# Patient Record
Sex: Female | Born: 1985
Health system: Southern US, Community
[De-identification: ages and names within clinical notes are randomized; demographics above are authoritative.]

## PROBLEM LIST (undated history)

## (undated) DIAGNOSIS — N3010 Interstitial cystitis (chronic) without hematuria: Secondary | ICD-10-CM

## (undated) DIAGNOSIS — N941 Unspecified dyspareunia: Secondary | ICD-10-CM

## (undated) DIAGNOSIS — G4700 Insomnia, unspecified: Secondary | ICD-10-CM

## (undated) DIAGNOSIS — N946 Dysmenorrhea, unspecified: Secondary | ICD-10-CM

## (undated) DIAGNOSIS — R0789 Other chest pain: Secondary | ICD-10-CM

## (undated) DIAGNOSIS — R198 Other specified symptoms and signs involving the digestive system and abdomen: Secondary | ICD-10-CM

## (undated) DIAGNOSIS — H6993 Unspecified Eustachian tube disorder, bilateral: Secondary | ICD-10-CM

## (undated) DIAGNOSIS — I959 Hypotension, unspecified: Secondary | ICD-10-CM

## (undated) DIAGNOSIS — IMO0002 Reserved for concepts with insufficient information to code with codable children: Secondary | ICD-10-CM

## (undated) DIAGNOSIS — Z9889 Other specified postprocedural states: Secondary | ICD-10-CM

## (undated) DIAGNOSIS — R102 Pelvic and perineal pain: Secondary | ICD-10-CM

## (undated) DIAGNOSIS — K828 Other specified diseases of gallbladder: Secondary | ICD-10-CM

## (undated) DIAGNOSIS — E063 Autoimmune thyroiditis: Secondary | ICD-10-CM

## (undated) DIAGNOSIS — R9431 Abnormal electrocardiogram [ECG] [EKG]: Secondary | ICD-10-CM

## (undated) DIAGNOSIS — F419 Anxiety disorder, unspecified: Secondary | ICD-10-CM

## (undated) DIAGNOSIS — R0609 Other forms of dyspnea: Secondary | ICD-10-CM

## (undated) DIAGNOSIS — N809 Endometriosis, unspecified: Secondary | ICD-10-CM

## (undated) DIAGNOSIS — E559 Vitamin D deficiency, unspecified: Secondary | ICD-10-CM

## (undated) DIAGNOSIS — B2790 Infectious mononucleosis, unspecified without complication: Secondary | ICD-10-CM

## (undated) DIAGNOSIS — F329 Major depressive disorder, single episode, unspecified: Secondary | ICD-10-CM

## (undated) DIAGNOSIS — M779 Enthesopathy, unspecified: Secondary | ICD-10-CM

## (undated) DIAGNOSIS — R112 Nausea with vomiting, unspecified: Secondary | ICD-10-CM

## (undated) DIAGNOSIS — Z8249 Family history of ischemic heart disease and other diseases of the circulatory system: Secondary | ICD-10-CM

## (undated) DIAGNOSIS — Z86018 Personal history of other benign neoplasm: Secondary | ICD-10-CM

## (undated) DIAGNOSIS — K900 Celiac disease: Secondary | ICD-10-CM

## (undated) DIAGNOSIS — A630 Anogenital (venereal) warts: Secondary | ICD-10-CM

## (undated) DIAGNOSIS — J310 Chronic rhinitis: Secondary | ICD-10-CM

## (undated) DIAGNOSIS — L309 Dermatitis, unspecified: Secondary | ICD-10-CM

## (undated) DIAGNOSIS — F32A Depression, unspecified: Secondary | ICD-10-CM

## (undated) DIAGNOSIS — T7840XA Allergy, unspecified, initial encounter: Secondary | ICD-10-CM

## (undated) DIAGNOSIS — R109 Unspecified abdominal pain: Secondary | ICD-10-CM

## (undated) DIAGNOSIS — A770 Spotted fever due to Rickettsia rickettsii: Secondary | ICD-10-CM

## (undated) DIAGNOSIS — J342 Deviated nasal septum: Secondary | ICD-10-CM

## (undated) DIAGNOSIS — N343 Urethral syndrome, unspecified: Secondary | ICD-10-CM

## (undated) DIAGNOSIS — R42 Dizziness and giddiness: Secondary | ICD-10-CM

## (undated) DIAGNOSIS — D329 Benign neoplasm of meninges, unspecified: Secondary | ICD-10-CM

## (undated) DIAGNOSIS — J302 Other seasonal allergic rhinitis: Secondary | ICD-10-CM

## (undated) DIAGNOSIS — K219 Gastro-esophageal reflux disease without esophagitis: Secondary | ICD-10-CM

## (undated) DIAGNOSIS — D649 Anemia, unspecified: Secondary | ICD-10-CM

## (undated) HISTORY — DX: Other specified postprocedural states: R11.2

## (undated) HISTORY — DX: Gastro-esophageal reflux disease without esophagitis: K21.9

## (undated) HISTORY — PX: OTHER SURGICAL HISTORY: SHX169

## (undated) HISTORY — DX: Chronic rhinitis: J31.0

## (undated) HISTORY — DX: Abnormal electrocardiogram (ECG) (EKG): R94.31

## (undated) HISTORY — DX: Benign neoplasm of meninges, unspecified: D32.9

## (undated) HISTORY — PX: OVARIAN CYST REMOVAL: SHX89

## (undated) HISTORY — DX: Interstitial cystitis (chronic) without hematuria: N30.10

## (undated) HISTORY — DX: Urethral syndrome, unspecified: N34.3

## (undated) HISTORY — DX: Reserved for concepts with insufficient information to code with codable children: IMO0002

## (undated) HISTORY — DX: Anemia, unspecified: D64.9

## (undated) HISTORY — PX: TONSILLECTOMY: SHX5217

## (undated) HISTORY — DX: Hypotension, unspecified: I95.9

## (undated) HISTORY — DX: Dermatitis, unspecified: L30.9

## (undated) HISTORY — DX: Major depressive disorder, single episode, unspecified: F32.9

## (undated) HISTORY — DX: Other forms of dyspnea: R06.09

## (undated) HISTORY — DX: Other specified diseases of gallbladder: K82.8

## (undated) HISTORY — DX: Depression, unspecified: F32.A

## (undated) HISTORY — DX: Dysmenorrhea, unspecified: N94.6

## (undated) HISTORY — DX: Other seasonal allergic rhinitis: J30.2

## (undated) HISTORY — DX: Deviated nasal septum: J34.2

## (undated) HISTORY — DX: Other specified symptoms and signs involving the digestive system and abdomen: R19.8

## (undated) HISTORY — DX: Insomnia, unspecified: G47.00

## (undated) HISTORY — DX: Endometriosis, unspecified: N80.9

## (undated) HISTORY — DX: Vitamin D deficiency, unspecified: E55.9

## (undated) HISTORY — DX: Infectious mononucleosis, unspecified without complication: B27.90

## (undated) HISTORY — DX: Personal history of other benign neoplasm: Z86.018

## (undated) HISTORY — DX: Anxiety disorder, unspecified: F41.9

## (undated) HISTORY — DX: Unspecified abdominal pain: R10.9

## (undated) HISTORY — DX: Dizziness and giddiness: R42

## (undated) HISTORY — DX: Other specified postprocedural states: Z98.890

## (undated) HISTORY — DX: Celiac disease: K90.0

## (undated) HISTORY — DX: Pelvic and perineal pain: R10.2

## (undated) HISTORY — DX: Enthesopathy, unspecified: M77.9

## (undated) HISTORY — DX: Anogenital (venereal) warts: A63.0

## (undated) HISTORY — DX: Unspecified eustachian tube disorder, bilateral: H69.93

## (undated) HISTORY — DX: Spotted fever due to Rickettsia rickettsii: A77.0

## (undated) HISTORY — DX: Autoimmune thyroiditis: E06.3

## (undated) HISTORY — DX: Unspecified dyspareunia: N94.10

## (undated) HISTORY — DX: Personal history of other benign neoplasm: Z98.890

## (undated) HISTORY — PX: CRANIOTOMY: SHX93

## (undated) HISTORY — DX: Family history of ischemic heart disease and other diseases of the circulatory system: Z82.49

## (undated) HISTORY — DX: Allergy, unspecified, initial encounter: T78.40XA

## (undated) HISTORY — DX: Other chest pain: R07.89

---
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
## 2004-08-09 ENCOUNTER — Encounter: Admission: RE | Admit: 2004-08-09 | Discharge: 2004-08-09 | Payer: Self-pay | Admitting: Internal Medicine

## 2007-11-23 ENCOUNTER — Encounter: Admission: RE | Admit: 2007-11-23 | Discharge: 2007-11-23 | Payer: Self-pay | Admitting: Family Medicine

## 2007-12-16 ENCOUNTER — Encounter: Admission: RE | Admit: 2007-12-16 | Discharge: 2007-12-16 | Payer: Self-pay | Admitting: Family

## 2011-10-06 ENCOUNTER — Ambulatory Visit (INDEPENDENT_AMBULATORY_CARE_PROVIDER_SITE_OTHER): Payer: BC Managed Care – PPO | Admitting: Physician Assistant

## 2011-10-06 DIAGNOSIS — H1045 Other chronic allergic conjunctivitis: Secondary | ICD-10-CM

## 2011-10-06 DIAGNOSIS — R350 Frequency of micturition: Secondary | ICD-10-CM

## 2011-10-06 DIAGNOSIS — F411 Generalized anxiety disorder: Secondary | ICD-10-CM

## 2011-10-06 DIAGNOSIS — R319 Hematuria, unspecified: Secondary | ICD-10-CM

## 2011-10-21 HISTORY — PX: UPPER GASTROINTESTINAL ENDOSCOPY: SHX188

## 2011-10-21 HISTORY — PX: COLONOSCOPY: SHX174

## 2011-11-03 ENCOUNTER — Ambulatory Visit (INDEPENDENT_AMBULATORY_CARE_PROVIDER_SITE_OTHER): Payer: BC Managed Care – PPO | Admitting: Physician Assistant

## 2011-11-03 DIAGNOSIS — N760 Acute vaginitis: Secondary | ICD-10-CM

## 2011-11-03 DIAGNOSIS — R300 Dysuria: Secondary | ICD-10-CM

## 2011-11-03 DIAGNOSIS — R1084 Generalized abdominal pain: Secondary | ICD-10-CM

## 2011-11-03 DIAGNOSIS — H209 Unspecified iridocyclitis: Secondary | ICD-10-CM

## 2011-11-04 ENCOUNTER — Other Ambulatory Visit: Payer: Self-pay | Admitting: Internal Medicine

## 2011-11-04 DIAGNOSIS — R102 Pelvic and perineal pain: Secondary | ICD-10-CM

## 2011-11-06 ENCOUNTER — Ambulatory Visit
Admission: RE | Admit: 2011-11-06 | Discharge: 2011-11-06 | Disposition: A | Discharge status: Home / Self Care | Payer: BC Managed Care – PPO | Source: Ambulatory Visit | Attending: Internal Medicine | Admitting: Internal Medicine

## 2011-11-06 DIAGNOSIS — R102 Pelvic and perineal pain: Secondary | ICD-10-CM

## 2011-11-11 ENCOUNTER — Other Ambulatory Visit: Payer: Self-pay

## 2011-12-03 ENCOUNTER — Ambulatory Visit (INDEPENDENT_AMBULATORY_CARE_PROVIDER_SITE_OTHER): Payer: BC Managed Care – PPO | Admitting: Family Medicine

## 2011-12-03 ENCOUNTER — Ambulatory Visit: Payer: BC Managed Care – PPO

## 2011-12-03 DIAGNOSIS — M79609 Pain in unspecified limb: Secondary | ICD-10-CM

## 2011-12-03 DIAGNOSIS — R5381 Other malaise: Secondary | ICD-10-CM

## 2011-12-03 DIAGNOSIS — M79606 Pain in leg, unspecified: Secondary | ICD-10-CM

## 2011-12-03 DIAGNOSIS — R079 Chest pain, unspecified: Secondary | ICD-10-CM

## 2011-12-03 DIAGNOSIS — R109 Unspecified abdominal pain: Secondary | ICD-10-CM

## 2011-12-03 DIAGNOSIS — IMO0001 Reserved for inherently not codable concepts without codable children: Secondary | ICD-10-CM

## 2011-12-03 DIAGNOSIS — R5383 Other fatigue: Secondary | ICD-10-CM

## 2011-12-03 DIAGNOSIS — R319 Hematuria, unspecified: Secondary | ICD-10-CM

## 2011-12-03 DIAGNOSIS — N3010 Interstitial cystitis (chronic) without hematuria: Secondary | ICD-10-CM

## 2011-12-03 LAB — POCT CBC
Granulocyte percent: 53.1 %G (ref 37–80)
HCT, POC: 40.6 % (ref 37.7–47.9)
Hemoglobin: 13.3 g/dL (ref 12.2–16.2)
Lymph, poc: 1.8 (ref 0.6–3.4)
MCH, POC: 30.6 pg (ref 27–31.2)
MCHC: 32.8 g/dL (ref 31.8–35.4)
MCV: 93.4 fL (ref 80–97)
MID (cbc): 0.3 (ref 0–0.9)
MPV: 9.8 fL (ref 0–99.8)
POC Granulocyte: 2.4 (ref 2–6.9)
POC LYMPH PERCENT: 40.9 %L (ref 10–50)
POC MID %: 6 %M (ref 0–12)
Platelet Count, POC: 260 10*3/uL (ref 142–424)
RBC: 4.35 M/uL (ref 4.04–5.48)
RDW, POC: 13.1 %
WBC: 4.5 10*3/uL — AB (ref 4.6–10.2)

## 2011-12-03 LAB — COMPREHENSIVE METABOLIC PANEL
ALT: 17 U/L (ref 0–35)
AST: 21 U/L (ref 0–37)
Albumin: 4.8 g/dL (ref 3.5–5.2)
Alkaline Phosphatase: 39 U/L (ref 39–117)
BUN: 9 mg/dL (ref 6–23)
CO2: 26 mEq/L (ref 19–32)
Calcium: 9.6 mg/dL (ref 8.4–10.5)
Chloride: 106 mEq/L (ref 96–112)
Creat: 0.64 mg/dL (ref 0.50–1.10)
Glucose, Bld: 82 mg/dL (ref 70–99)
Potassium: 4.2 mEq/L (ref 3.5–5.3)
Sodium: 141 mEq/L (ref 135–145)
Total Bilirubin: 0.5 mg/dL (ref 0.3–1.2)
Total Protein: 7.5 g/dL (ref 6.0–8.3)

## 2011-12-03 LAB — POCT UA - MICROSCOPIC ONLY
Casts, Ur, LPF, POC: NEGATIVE
Crystals, Ur, HPF, POC: NEGATIVE
Mucus, UA: NEGATIVE
Yeast, UA: NEGATIVE

## 2011-12-03 LAB — POCT SEDIMENTATION RATE: POCT SED RATE: 10 mm/hr (ref 0–22)

## 2011-12-03 LAB — POCT WET PREP WITH KOH
Clue Cells Wet Prep HPF POC: 100
KOH Prep POC: NEGATIVE
Trichomonas, UA: NEGATIVE
Yeast Wet Prep HPF POC: NEGATIVE

## 2011-12-03 LAB — POCT URINALYSIS DIPSTICK
Bilirubin, UA: NEGATIVE
Glucose, UA: NEGATIVE
Nitrite, UA: NEGATIVE
Protein, UA: 30
Spec Grav, UA: 1.025
Urobilinogen, UA: 0.2
pH, UA: 5.5

## 2011-12-03 LAB — POCT URINE PREGNANCY: Preg Test, Ur: NEGATIVE

## 2011-12-03 LAB — TSH: TSH: 1.767 u[IU]/mL (ref 0.350–4.500)

## 2011-12-03 LAB — VITAMIN B12: Vitamin B-12: 449 pg/mL (ref 211–911)

## 2011-12-03 LAB — GLUCOSE, POCT (MANUAL RESULT ENTRY): POC Glucose: 76

## 2011-12-03 MED ORDER — CYCLOBENZAPRINE HCL 5 MG PO TABS: ORAL_TABLET | ORAL | Status: DC

## 2011-12-03 MED ORDER — METRONIDAZOLE 0.75 % VA GEL: 1.0000 | Freq: Two times a day (BID) | VAGINAL | Status: AC

## 2011-12-03 MED ORDER — OMEPRAZOLE 20 MG PO CPDR: 20.0000 mg | DELAYED_RELEASE_CAPSULE | Freq: Every day | ORAL | Status: DC

## 2011-12-03 MED ORDER — DULOXETINE HCL 30 MG PO CPEP: 30.0000 mg | ORAL_CAPSULE | Freq: Every day | ORAL | Status: DC

## 2011-12-03 NOTE — Progress Notes (Signed)
Patient ID: Mary Wyatt MRN: [id number], DOB: 13-Feb-1986, 26 y.o. Date of Encounter: 12/03/2011, 12:41 PM  Primary Physician: Kennon Portela, MD, MD  Chief Complaint: Continued abdominal pain, now with chest pain/burning for 1 week, and intermittent bilateral leg pain for 2 months.  HPI: 26 y.o. year old female with history below presents with multiple complaints. 1. Chest pain for 1 week. Described as a midline burning sensation. Pain is constant, but more noticeable at night when she lays down for sleep. Described as 5/10 at its max. No associated nausea, vomiting, or diaphoresis. Has taken Zantac 150 mg off and on, and attributes relief with this medication. Denies any family history of cardiac disease. Denies illicit substances. She also mentions a mildly chronic cough, stating that she feel like she is frequently has a sensation to clear her throat.  2. Continued abdominal abdominal pain for several months now. Pain is generalized over the abdomen, but worse along the RLQ. Has seen urology and her GYN for this and told she likely has IC with exacerbating pain secondary to hormonal changes with her OCP. Approximately 10 days prior she started a new OCP, however her symptoms have persisted. Her GYN is considering possible diagnosis of endometriosis, however no formal work up has been done to date. She has since stopped taking her Uribel and Oxybutynin. Her urinary frequency has decreased since her last visit. She denies any dysuria, urgency, nausea, or vomiting. Does complain of vaginal discharge at baseline. Last Pap smear fall 2012: normal. No intercourse since the first week of January. No new partners. Does drink a moderate amount of alcohol.  3. Complains of generalized intermittent bilateral lower extremity pain for the past 2 months. This is the first time she mentions this me. States her "whole legs" ache. No trauma or injury. No associated back pain. No loss of bowel or bladder  function. No numbness or paresthesias. No rash. "I'm just tired of feeling achy."    Past Medical History  Diagnosis Date  . Interstitial cystitis      Home Meds: Prior to Admission medications   Medication Sig Start Date End Date Taking? Authorizing Provider  Norgestim-Eth Estrad Triphasic (ORTHO TRI-CYCLEN, 28, PO) Take by mouth daily.   Yes Historical Provider, MD    Allergies:  Allergies  Allergen Reactions  . Diflucan Hives    History   Social History  . Marital Status: Single    Spouse Name: N/A    Number of Children: N/A  . Years of Education: N/A   Occupational History  . Not on file.   Social History Main Topics  . Smoking status: Never Smoker   . Smokeless tobacco: Not on file  . Alcohol Use: Yes  . Drug Use: No  . Sexually Active: Not Currently   Other Topics Concern  . Not on file   Social History Narrative  . No narrative on file     Review of Systems: Constitutional: negative for chills, fever, night sweats, weight changes, or fatigue  HEENT: negative for vision changes, hearing loss, congestion, rhinorrhea, ST, epistaxis, or sinus pressure Cardiovascular: negative for palpitations, SOB, or DOE Respiratory: negative for hemoptysis, wheezing, shortness of breath, or cough Abdominal: negative for nausea, vomiting, diarrhea, constipation, dysuria, frequency, urgency, abnormal bleeding, or pelvic pain Dermatological: negative for rash Neurologic: negative for headache, dizziness, or syncope All other systems reviewed and are otherwise negative with the exception to those above and in the HPI.   Physical Exam: Blood pressure 124/80,  pulse 80, temperature 99.1 F (37.3 C), temperature source Oral, resp. rate 16, height 5' 5.25" (1.657 m), weight 132 lb (59.875 kg), last menstrual period 10/23/2011., Body mass index is 21.80 kg/(m^2). General: Well developed, well nourished, in no acute distress. Head: Normocephalic, atraumatic, eyes without  discharge, sclera non-icteric, nares are without discharge. Bilateral auditory canals clear, TM's are without perforation, pearly grey and translucent with reflective cone of light bilaterally. Oral cavity moist, posterior pharynx without exudate, erythema, peritonsillar abscess, or post nasal drip.  Neck: Supple. No thyromegaly. Full ROM. No lymphadenopathy. Lungs: Clear bilaterally to auscultation without wheezes, rales, or rhonchi. Breathing is unlabored. Heart: RRR with S1 S2. No murmurs, rubs, or gallops appreciated. Abdomen: Soft, non-distended with normoactive bowel sounds. Mild generalized TTP greater in epigastric and RLQ regions. Negative Mcburney's, Rovsing's, and Iliopsoas. No table jar discomfort. No hepatosplenomegaly. No rebound/guarding. No obvious abdominal masses. Msk:  Strength and tone normal for age. Extremities/Skin: Warm and dry. No clubbing or cyanosis. No edema. No rashes or suspicious lesions. Neuro: Alert and oriented X 3. Moves all extremities spontaneously. Gait is normal. CNII-XII grossly in tact. Lower extremity reflexes 2+ bilaterally. Psych:  Responds to questions appropriately with a normal affect.   Labs: Results for orders placed in visit on 12/03/11  POCT CBC      Component Value Range   WBC 4.5 (*) 4.6 - 10.2 (K/uL)   Lymph, poc 1.8  0.6 - 3.4    POC LYMPH PERCENT 40.9  10 - 50 (%L)   MID (cbc) 0.3  0 - 0.9    POC MID % 6.0  0 - 12 (%M)   POC Granulocyte 2.4  2 - 6.9    Granulocyte percent 53.1  37 - 80 (%G)   RBC 4.35  4.04 - 5.48 (M/uL)   Hemoglobin 13.3  12.2 - 16.2 (g/dL)   HCT, POC 40.6  37.7 - 47.9 (%)   MCV 93.4  80 - 97 (fL)   MCH, POC 30.6  27 - 31.2 (pg)   MCHC 32.8  31.8 - 35.4 (g/dL)   RDW, POC 13.1     Platelet Count, POC 260  142 - 424 (K/uL)   MPV 9.8  0 - 99.8 (fL)  GLUCOSE, POCT (MANUAL RESULT ENTRY)      Component Value Range   POC Glucose 76    POCT SEDIMENTATION RATE      Component Value Range   POCT SED RATE    0 - 22  (mm/hr)  POCT UA - MICROSCOPIC ONLY      Component Value Range   WBC, Ur, HPF, POC 0-3     RBC, urine, microscopic 0-1     Bacteria, U Microscopic 3+     Mucus, UA negative     Epithelial cells, urine per micros 6-15     Crystals, Ur, HPF, POC negative     Casts, Ur, LPF, POC negative     Yeast, UA negative    POCT URINALYSIS DIPSTICK      Component Value Range   Color, UA yellow     Clarity, UA clear     Glucose, UA neg     Bilirubin, UA neg     Ketones, UA trace     Spec Grav, UA 1.025     Blood, UA trace-lysed     pH, UA 5.5     Protein, UA 30     Urobilinogen, UA 0.2     Nitrite, UA neg  Leukocytes, UA Trace    POCT URINE PREGNANCY      Component Value Range   Preg Test, Ur Negative    POCT WET PREP WITH KOH      Component Value Range   Trichomonas, UA Negative     Clue Cells Wet Prep HPF POC 100%     Epithelial Wet Prep HPF POC 1-4     Yeast Wet Prep HPF POC negative     Bacteria Wet Prep HPF POC 3+     RBC Wet Prep HPF POC 3-4     WBC Wet Prep HPF POC 3-8     KOH Prep POC Negative     [facility] reading (PRIMARY) by  Dr. Darron Doom. NAD. EKG: NSR, read with Dr. Darron Doom Labs pending: CMP, ESR, H pylori, TSH, B12, Prolactin, Uriprobe, urine culture   ASSESSMENT AND PLAN:  26 y.o. year old female with reflux, abdominal pain, leg pain, and BV. Consider somatization/fibromyalgia disorder. 1. Reflux -Likely the true etiology of her CP,and cough.  -Trial of omeprazole 20 mg #30 1 po daily RF3 -Decrease aggravating foods    2. Abdominal pain  -Trial of Cymbalta 30 mg daily #30 1 po daily RF1 -Etiology still somewhat unclear. Discussed in detail with patient today the differential diagnosis. She agrees with our evaluation and treatment plan - Advise patient to follow up with GYN as directed for further evaluation of possible endometriosis.  -Can also consider giving IC treatment a longer trial if no help with the above  3. Leg pain -Normal exam today.  -Trial of  flexeril 5 mg #30 1 po qhs RF2 -Again, discussed in detail with the patient today about the differential diagnosis for her complaints.  4. Bacterial vaginosis -Metrogel #1 apply one applicator full bid no RF -Consider boric acid if persistent   5. Await all labs for further treatment -Call for update -Patient has next scheduled appointment with me on 2/25. Will follow up with her at that time.   SignedChristell Faith, PA-C 12/03/2011 12:41 PM

## 2011-12-04 LAB — PROLACTIN: Prolactin: 7.6 ng/mL

## 2011-12-04 LAB — GC/CHLAMYDIA PROBE AMP, URINE
Chlamydia, Swab/Urine, PCR: NEGATIVE
GC Probe Amp, Urine: NEGATIVE

## 2011-12-05 LAB — URINE CULTURE
Colony Count: NO GROWTH
Organism ID, Bacteria: NO GROWTH

## 2011-12-09 LAB — H. PYLORI ANTIBODY, IGG: H Pylori IgG: <0.4 {ISR}

## 2011-12-15 ENCOUNTER — Ambulatory Visit (INDEPENDENT_AMBULATORY_CARE_PROVIDER_SITE_OTHER): Payer: BC Managed Care – PPO | Admitting: Family Medicine

## 2011-12-15 ENCOUNTER — Encounter: Payer: Self-pay | Admitting: Physician Assistant

## 2011-12-15 DIAGNOSIS — R1084 Generalized abdominal pain: Secondary | ICD-10-CM

## 2011-12-15 DIAGNOSIS — F411 Generalized anxiety disorder: Secondary | ICD-10-CM

## 2011-12-15 DIAGNOSIS — F419 Anxiety disorder, unspecified: Secondary | ICD-10-CM

## 2011-12-15 LAB — POCT UA - MICROSCOPIC ONLY
Casts, Ur, LPF, POC: NEGATIVE
Crystals, Ur, HPF, POC: NEGATIVE
Yeast, UA: NEGATIVE

## 2011-12-15 LAB — POCT WET PREP WITH KOH
KOH Prep POC: POSITIVE
RBC Wet Prep HPF POC: NEGATIVE
Trichomonas, UA: NEGATIVE
Yeast Wet Prep HPF POC: NEGATIVE

## 2011-12-15 LAB — POCT URINALYSIS DIPSTICK
Bilirubin, UA: NEGATIVE
Glucose, UA: NEGATIVE
Ketones, UA: NEGATIVE
Leukocytes, UA: NEGATIVE
Nitrite, UA: NEGATIVE
Protein, UA: NEGATIVE
Spec Grav, UA: 1.02
Urobilinogen, UA: 0.2
pH, UA: 7.5

## 2011-12-15 LAB — POCT URINE PREGNANCY: Preg Test, Ur: NEGATIVE

## 2011-12-15 MED ORDER — ALPRAZOLAM 0.5 MG PO TABS: 0.5000 mg | ORAL_TABLET | Freq: Three times a day (TID) | ORAL | Status: AC | PRN

## 2011-12-15 NOTE — Progress Notes (Signed)
Patient ID: Mary Wyatt MRN: [id number], DOB: 1985-11-20, 26 y.o. Date of Encounter: 12/15/2011, 2:12 PM  Primary Physician: No primary provider on file.  Chief Complaint: Follow up from 12/03/11 visit  HPI: 26 y.o. year old female with history below presents for follow up of abdominal pain, leg pain, and chest pain. Doing much better today. She has only taken 1 Cymbalta since her last visit. She states this caused her to have a "panic attack" and nightmares. She has been taking Xanax off and on since this last visit and has noticed a considerable improvement with her symptoms. She states years ago she tried Paxil with similar results to Cymbalta.  Abdominal pain has considerably improved to an occasional dull ache generalized over the abdomen. No sharp pains. Currently without pain. States it has "been a while" since she last had pain. She has not had any dysuria, urinary urgency, or urinary frequency. No current vaginal discharge or complaints.   No further chest pain. She does state that on most nights now she will have nightmares that will wake her, but will wake with a clear head. Xanax has helped with this complaint. Denies any SOB, dyspnea, nausea, vomiting, or diaphoresis.  Her bilateral leg pains have fully resolved since her last visit.     Past Medical History  Diagnosis Date  . Interstitial cystitis      Home Meds: Prior to Admission medications   Medication Sig Start Date End Date Taking? Authorizing Provider  cyclobenzaprine (FLEXERIL) 5 MG tablet 1 po qhs 12/03/11  Yes Rodderick Holtzer, PA-C  Norgestim-Eth Estrad Triphasic (ORTHO TRI-CYCLEN, 28, PO) Take by mouth daily.   Yes Historical Provider, MD  omeprazole (PRILOSEC) 20 MG capsule Take 1 capsule (20 mg total) by mouth daily. 12/03/11 12/02/12 Yes Elainah Rhyne, PA-C  DULoxetine (CYMBALTA) 30 MG capsule Take 1 capsule (30 mg total) by mouth daily. 12/03/11 12/02/12  Christell Faith, PA-C    Allergies:  Allergies  Allergen  Reactions  . Diflucan Hives    History   Social History  . Marital Status: Single    Spouse Name: N/A    Number of Children: N/A  . Years of Education: N/A   Occupational History  . Not on file.   Social History Main Topics  . Smoking status: Never Smoker   . Smokeless tobacco: Not on file  . Alcohol Use: Yes  . Drug Use: No  . Sexually Active: Not Currently   Other Topics Concern  . Not on file   Social History Narrative  . No narrative on file     Review of Systems: Constitutional: negative for chills, fever, night sweats, weight changes, or fatigue  HEENT: negative for vision changes, hearing loss, congestion, rhinorrhea, ST, epistaxis, or sinus pressure Cardiovascular: negative for chest pain or palpitations Respiratory: negative for hemoptysis, wheezing, shortness of breath, or cough Abdominal: negative for abdominal pain, nausea, vomiting, diarrhea, or constipation Dermatological: negative for rash Neurologic: negative for headache, dizziness, or syncope All other systems reviewed and are otherwise negative with the exception to those above and in the HPI.   Physical Exam: Blood pressure 132/70, pulse 96, temperature 98.4 F (36.9 C), temperature source Oral, resp. rate 16, height 5' 5"  (1.651 m), weight 135 lb 12.8 oz (61.598 kg), last menstrual period 10/23/2011., Body mass index is 22.60 kg/(m^2). General: Well developed, well nourished, in no acute distress. Head: Normocephalic, atraumatic, eyes without discharge, sclera non-icteric, nares are without discharge. Bilateral auditory canals clear, TM's are  without perforation, pearly grey and translucent with reflective cone of light bilaterally. Oral cavity moist, posterior pharynx without exudate, erythema, peritonsillar abscess, or post nasal drip.  Neck: Supple. No thyromegaly. Full ROM. No lymphadenopathy. Lungs: Clear bilaterally to auscultation without wheezes, rales, or rhonchi. Breathing is  unlabored. Heart: RRR with S1 S2. No murmurs, rubs, or gallops appreciated. Abdomen: Soft, non-tender, non-distended with normoactive bowel sounds. No hepatosplenomegaly. No rebound/guarding. No obvious abdominal masses. Msk:  Strength and tone normal for age. Extremities/Skin: Warm and dry. No clubbing or cyanosis. No edema. No rashes or suspicious lesions. Neuro: Alert and oriented X 3. Moves all extremities spontaneously. Gait is normal. CNII-XII grossly in tact. Psych:  Responds to questions appropriately with a normal affect.   Labs: Results for orders placed in visit on 12/15/11  POCT URINALYSIS DIPSTICK      Component Value Range   Color, UA yellow     Clarity, UA clear     Glucose, UA neg     Bilirubin, UA neg     Ketones, UA neg     Spec Grav, UA 1.020     Blood, UA trace     pH, UA 7.5     Protein, UA neg     Urobilinogen, UA 0.2     Nitrite, UA neg     Leukocytes, UA Negative    POCT UA - MICROSCOPIC ONLY      Component Value Range   WBC, Ur, HPF, POC 0-6     RBC, urine, microscopic 0-4     Bacteria, U Microscopic small     Mucus, UA trace     Epithelial cells, urine per micros 1-8     Crystals, Ur, HPF, POC neg     Casts, Ur, LPF, POC neg     Yeast, UA neg    POCT URINE PREGNANCY      Component Value Range   Preg Test, Ur Negative    POCT WET PREP WITH KOH      Component Value Range   Trichomonas, UA Negative     Clue Cells Wet Prep HPF POC 0-10     Epithelial Wet Prep HPF POC 5-28     Yeast Wet Prep HPF POC neg     Bacteria Wet Prep HPF POC 2+     RBC Wet Prep HPF POC neg     WBC Wet Prep HPF POC 0-6     KOH Prep POC Positive       ASSESSMENT AND PLAN:  26 y.o. year old female with resolved chest pain, abdominal pain, and leg pain secondary to somatization disorder/fibromyalgia. -Unable to tolerate Paxil, Zoloft, Prozac, Celexa, Lexapro, or Cymbalta -Trial of Xanax 0.5 mg 1 po tid prn anxiety #90 no RF -Should her abdominal pain return I feel it  would be best for her to follow up with her GYN for further testing -Recheck 1 month -Discussed with Dr. Darron Doom  Signed, Christell Faith, PA-C 12/15/2011 2:12 PM

## 2012-01-26 ENCOUNTER — Encounter: Payer: Self-pay | Admitting: Physician Assistant

## 2012-01-26 ENCOUNTER — Ambulatory Visit (INDEPENDENT_AMBULATORY_CARE_PROVIDER_SITE_OTHER): Payer: BC Managed Care – PPO | Admitting: Physician Assistant

## 2012-01-26 DIAGNOSIS — R1084 Generalized abdominal pain: Secondary | ICD-10-CM

## 2012-01-26 DIAGNOSIS — F411 Generalized anxiety disorder: Secondary | ICD-10-CM

## 2012-01-26 DIAGNOSIS — B3749 Other urogenital candidiasis: Secondary | ICD-10-CM

## 2012-01-26 LAB — POCT UA - MICROSCOPIC ONLY
Casts, Ur, LPF, POC: NEGATIVE
Crystals, Ur, HPF, POC: NEGATIVE
Mucus, UA: NEGATIVE
Yeast, UA: NEGATIVE

## 2012-01-26 LAB — POCT URINALYSIS DIPSTICK
Bilirubin, UA: NEGATIVE
Glucose, UA: NEGATIVE
Ketones, UA: NEGATIVE
Leukocytes, UA: NEGATIVE
Nitrite, UA: NEGATIVE
Protein, UA: NEGATIVE
Spec Grav, UA: 1.015
Urobilinogen, UA: 0.2
pH, UA: 7

## 2012-01-26 LAB — POCT WET PREP WITH KOH
Clue Cells Wet Prep HPF POC: NEGATIVE
KOH Prep POC: POSITIVE
Trichomonas, UA: NEGATIVE
Yeast Wet Prep HPF POC: NEGATIVE

## 2012-01-26 LAB — POCT URINE PREGNANCY: Preg Test, Ur: NEGATIVE

## 2012-01-26 NOTE — Progress Notes (Signed)
Patient ID: Mary Wyatt MRN: [id number], DOB: September 06, 1986, 26 y.o. Date of Encounter: 01/26/2012, 1:35 PM  Primary Physician: No primary provider on file.  Chief Complaint: Follow up abdominal pain, and urinary frequency  HPI: 26 y.o. year old female with history below presents for follow up of abdominal pain, and urinary frequency. See previous OV's. Since her last visit in Feb she continues to complain of off and on abdominal pain worse along the right lower quadrant. Her pain is still characterized as an ache, that becomes sharp if she presses along the RLQ. It has been discussed that this pain may be related to a possible diagnosis of endometriosis, however medical therapy has ben advised to date. Unfortunately she has now failed her 3rd different OCP to help manage this possible diagnosis. She does have an appointment with her GYN the first part of May for a repeat Pap smear, she plans to discuss her pain with them at that time. Today she would like to recheck her urine, blood work, and wet prep. She has not had intercourse since sometime in January 2013.  She mentions that her urinary frequency/urgency had been resolved for "sometime," however it did return over the past few days. She notes the return of her increased frequency/urgency coupled with her menses. Since her menses have resolved her vaginal complaints have resolved.  She describes a new symptoms today of a "bulge" just below her sternum that has been present since 12/16/11. She notes that since she has noticed this bulge she has had an increased amount of belching, heartburn, bloating sensation, and dry cough. This area is sore to palpation and is described as about the size of a mouse ball. She feels like it may have decreased in size over the past weeks. No hemoptysis, nausea, or emesis. She denies any constipation or diarrhea.   She has tried taking her Xanax 0.5 mg daily to bid for anxiety and feels like it has helped. She  however is not sure if this has helped with her above issues, as she states she didn't think about them. She would like to continue to take her Xanax 0.5 mg, but does not currently need a refill today.  She has not had any further chest pains since her visit on 12/03/11. Her leg pains have also resolved. She states they were sore "one day, but that was after I worked out."   Past Medical History  Diagnosis Date  . Interstitial cystitis      Home Meds: Prior to Admission medications   Medication Sig Start Date End Date Taking? Authorizing Provider  ALPRAZolam Duanne Moron) 0.5 MG tablet Take 0.5 mg by mouth at bedtime as needed.   Yes Historical Provider, MD  Norgestim-Eth Estrad Triphasic (ORTHO TRI-CYCLEN, 28, PO) Take by mouth daily.   Yes Historical Provider, MD  omeprazole (PRILOSEC) 20 MG capsule Take 1 capsule (20 mg total) by mouth daily. 12/03/11 12/02/12 Yes Keltin Baird M Jin Shockley, PA-C  cyclobenzaprine (FLEXERIL) 5 MG tablet 1 po qhs 12/03/11   Rise Mu, PA-C  DULoxetine (CYMBALTA) 30 MG capsule Take 1 capsule (30 mg total) by mouth daily. 12/03/11 12/02/12  Rise Mu, PA-C    Allergies:  Allergies  Allergen Reactions  . Diflucan Hives    History   Social History  . Marital Status: Single    Spouse Name: N/A    Number of Children: N/A  . Years of Education: N/A   Occupational History  . Not on file.  Social History Main Topics  . Smoking status: Former Research scientist (life sciences)  . Smokeless tobacco: Not on file  . Alcohol Use: Yes     ocas  . Drug Use: No  . Sexually Active: Not Currently   Other Topics Concern  . Not on file   Social History Narrative  . No narrative on file     Review of Systems: Constitutional: negative for chills, fever, night sweats, weight changes, or fatigue  HEENT: negative for vision changes, hearing loss, congestion, rhinorrhea, ST, epistaxis, or sinus pressure Cardiovascular: negative for chest pain or palpitations Respiratory: negative for hemoptysis,  wheezing, dyspnea, or shortness of breath Abdominal: negative for nausea, vomiting, diarrhea, or constipation Dermatological: negative for rash Neurologic: negative for headache, dizziness, or syncope All other systems reviewed and are otherwise negative with the exception to those above and in the HPI.   Physical Exam: Blood pressure 113/63, pulse 64, temperature 98.9 F (37.2 C), temperature source Oral, resp. rate 16, height 5' 5.5" (1.664 m), weight 134 lb 12.8 oz (61.145 kg), last menstrual period 01/13/2012., Body mass index is 22.09 kg/(m^2). General: Well developed, well nourished, in no acute distress. Head: Normocephalic, atraumatic, eyes without discharge, sclera non-icteric, nares are without discharge. Bilateral auditory canals clear, TM's are without perforation, pearly grey and translucent with reflective cone of light bilaterally. Oral cavity moist, posterior pharynx without exudate, erythema, peritonsillar abscess, or post nasal drip.  Neck: Supple. No thyromegaly. Full ROM. No lymphadenopathy. Lungs: Clear bilaterally to auscultation without wheezes, rales, or rhonchi. Breathing is unlabored. Heart: RRR with S1 S2. No murmurs, rubs, or gallops appreciated. Abdomen: Soft with mild tender to palpation diffusely. Non-distended with normoactive bowel sounds. No hepatosplenomegaly. No rebound/guarding. No obvious abdominal masses. No mass palpated substernally. McBurney's, Rovsing's, Iliopsoas, and table jar all negative.  Msk:  Strength and tone normal for age. Extremities/Skin: Warm and dry. No clubbing or cyanosis. No edema. No rashes or suspicious lesions. Neuro: Alert and oriented X 3. Moves all extremities spontaneously. Gait is normal. CNII-XII grossly in tact. Psych:  Responds to questions appropriately with a normal affect.   Labs: Results for orders placed in visit on 01/26/12  POCT UA - MICROSCOPIC ONLY      Component Value Range   WBC, Ur, HPF, POC 0-3     RBC,  urine, microscopic 0-3     Bacteria, U Microscopic trace     Mucus, UA neg     Epithelial cells, urine per micros 0-5     Crystals, Ur, HPF, POC neg     Casts, Ur, LPF, POC neg     Yeast, UA neg    POCT URINALYSIS DIPSTICK      Component Value Range   Color, UA yellow     Clarity, UA clear     Glucose, UA neg     Bilirubin, UA neg     Ketones, UA neg     Spec Grav, UA 1.015     Blood, UA trace     pH, UA 7.0     Protein, UA neg     Urobilinogen, UA 0.2     Nitrite, UA neg     Leukocytes, UA Negative    POCT URINE PREGNANCY      Component Value Range   Preg Test, Ur Negative    POCT WET PREP WITH KOH      Component Value Range   Trichomonas, UA Negative     Clue Cells Wet Prep HPF POC neg  Epithelial Wet Prep HPF POC 0-8     Yeast Wet Prep HPF POC neg     Bacteria Wet Prep HPF POC 1+     RBC Wet Prep HPF POC 0-2     WBC Wet Prep HPF POC 0-2     KOH Prep POC Positive     CMP, TSH, CBC, H pylori, and urine culture all pending.  ASSESSMENT AND PLAN:  26 y.o. year old female with abdominal pain, reflux, anxiety, and vaginal candidiasis. 1. Abdominal pain -Unclear etiology. It has been felt that this was secondary to IC, she however feels this is incorrect. We have discussed possible causes in detail. She will discuss the possibility of endometriosis as a diagnosis with her GYN at her follow up appointment. Patient at this time seems to be leaning towards wanting a laproscopy. I have advised her to discuss this with her GYN. -GI referral   2. Reflux -Restart Oeprazole 20 mg bid, has refills -GI referral  3. Anxiety -Improving -Continue Xanax 0.5 mg 1 po bid scheduled. Did not write prescription today, as she did not yet need one. -Can Rx when she calls -Question how wide spread this component is relating to her above issues  4. Vaginal candidiasis -Allergy to diflucan -She does tolerate OTC Monostat, so she will pick up a package to treat locally  5. Await pending  labs -Follow up at this time has been set at 2 months, but will finalize this once labs are back for review  6. RTC precautions   Signed, Christell Faith, PA-C 01/26/2012 1:35 PM

## 2012-01-27 LAB — COMPREHENSIVE METABOLIC PANEL
ALT: 13 U/L (ref 0–35)
AST: 20 U/L (ref 0–37)
Albumin: 4.5 g/dL (ref 3.5–5.2)
Alkaline Phosphatase: 40 U/L (ref 39–117)
BUN: 8 mg/dL (ref 6–23)
CO2: 27 mEq/L (ref 19–32)
Calcium: 9.4 mg/dL (ref 8.4–10.5)
Chloride: 104 mEq/L (ref 96–112)
Creat: 0.6 mg/dL (ref 0.50–1.10)
Glucose, Bld: 84 mg/dL (ref 70–99)
Potassium: 4 mEq/L (ref 3.5–5.3)
Sodium: 138 mEq/L (ref 135–145)
Total Bilirubin: 0.4 mg/dL (ref 0.3–1.2)
Total Protein: 6.9 g/dL (ref 6.0–8.3)

## 2012-01-27 LAB — CBC
HCT: 36.9 % (ref 36.0–46.0)
Hemoglobin: 12.4 g/dL (ref 12.0–15.0)
MCH: 30.8 pg (ref 26.0–34.0)
MCHC: 33.6 g/dL (ref 30.0–36.0)
MCV: 91.8 fL (ref 78.0–100.0)
Platelets: 213 10*3/uL (ref 150–400)
RBC: 4.02 MIL/uL (ref 3.87–5.11)
RDW: 12.6 % (ref 11.5–15.5)
WBC: 4.1 10*3/uL (ref 4.0–10.5)

## 2012-01-27 LAB — URINE CULTURE
Colony Count: NO GROWTH
Organism ID, Bacteria: NO GROWTH

## 2012-01-27 LAB — TSH: TSH: 2.239 u[IU]/mL (ref 0.350–4.500)

## 2012-01-28 ENCOUNTER — Telehealth: Payer: Self-pay

## 2012-01-28 NOTE — Telephone Encounter (Signed)
Mary Wyatt,  PATIENT STATES SHE IS TO TAKE omeprazole (PRILOSEC) 20 MG capsule TWO TIMES A DAY, REQUESTING A NEW PRESCRIPTION CALLED INTO [facility] ON [address] TO REFLECT QUANTITY 60

## 2012-01-29 MED ORDER — OMEPRAZOLE 20 MG PO CPDR: 20.0000 mg | DELAYED_RELEASE_CAPSULE | Freq: Two times a day (BID) | ORAL | Status: DC

## 2012-01-29 NOTE — Telephone Encounter (Signed)
Done and sent to the pharmacy.  Mary Wyatt

## 2012-01-30 NOTE — Telephone Encounter (Signed)
Called pt to advise RX at pharmacy. Pt understood

## 2012-02-09 ENCOUNTER — Telehealth: Payer: Self-pay

## 2012-02-09 MED ORDER — ALPRAZOLAM 0.5 MG PO TABS: 0.5000 mg | ORAL_TABLET | Freq: Two times a day (BID) | ORAL | Status: DC

## 2012-02-09 NOTE — Telephone Encounter (Signed)
Can you please advise?

## 2012-02-09 NOTE — Telephone Encounter (Signed)
PT STATES SHE HAD LABS DONE ON THE 8TH AND STILL HASN'T HEARD FROM ANYONE. PLEASE CALL [phone] AND WAS ALSO TOLD BY RYAN TO CALL FOR A REFILL ON HER XANAX.

## 2012-02-09 NOTE — Telephone Encounter (Signed)
Labs are normal. Xanax refill sent in.  Lashonta Pilling

## 2012-02-10 ENCOUNTER — Other Ambulatory Visit: Payer: Self-pay | Admitting: Gastroenterology

## 2012-02-10 DIAGNOSIS — R1031 Right lower quadrant pain: Secondary | ICD-10-CM

## 2012-02-10 DIAGNOSIS — R110 Nausea: Secondary | ICD-10-CM

## 2012-02-10 NOTE — Telephone Encounter (Signed)
Spoke with pt advised labs normal and I faxed Rx to [facility] on [address]. Pt understood

## 2012-02-13 ENCOUNTER — Other Ambulatory Visit: Payer: BC Managed Care – PPO

## 2012-02-16 ENCOUNTER — Telehealth: Payer: Self-pay | Admitting: Physician Assistant

## 2012-02-16 DIAGNOSIS — F419 Anxiety disorder, unspecified: Secondary | ICD-10-CM

## 2012-02-16 MED ORDER — ALPRAZOLAM 0.5 MG PO TABS: 0.5000 mg | ORAL_TABLET | Freq: Three times a day (TID) | ORAL | Status: DC | PRN

## 2012-02-16 NOTE — Telephone Encounter (Signed)
rx printed

## 2012-02-20 ENCOUNTER — Ambulatory Visit
Admission: RE | Admit: 2012-02-20 | Discharge: 2012-02-20 | Disposition: A | Discharge status: Home / Self Care | Payer: BC Managed Care – PPO | Source: Ambulatory Visit | Attending: Gastroenterology | Admitting: Gastroenterology

## 2012-02-20 DIAGNOSIS — R110 Nausea: Secondary | ICD-10-CM

## 2012-02-20 DIAGNOSIS — R1031 Right lower quadrant pain: Secondary | ICD-10-CM

## 2012-02-20 MED ORDER — IOHEXOL 300 MG/ML  SOLN
100.0000 mL | Freq: Once | INTRAMUSCULAR | Status: AC | PRN
  Administered 2012-02-20: 100 mL via INTRAVENOUS

## 2012-02-27 ENCOUNTER — Other Ambulatory Visit: Payer: Self-pay | Admitting: Gastroenterology

## 2012-02-27 DIAGNOSIS — R1013 Epigastric pain: Secondary | ICD-10-CM

## 2012-02-27 DIAGNOSIS — R110 Nausea: Secondary | ICD-10-CM

## 2012-03-03 ENCOUNTER — Ambulatory Visit (INDEPENDENT_AMBULATORY_CARE_PROVIDER_SITE_OTHER): Payer: BC Managed Care – PPO | Admitting: Emergency Medicine

## 2012-03-03 ENCOUNTER — Encounter: Payer: Self-pay | Admitting: Physician Assistant

## 2012-03-03 ENCOUNTER — Ambulatory Visit: Payer: BC Managed Care – PPO

## 2012-03-03 DIAGNOSIS — R059 Cough, unspecified: Secondary | ICD-10-CM

## 2012-03-03 DIAGNOSIS — R05 Cough: Secondary | ICD-10-CM

## 2012-03-03 DIAGNOSIS — R1013 Epigastric pain: Secondary | ICD-10-CM

## 2012-03-03 DIAGNOSIS — Z23 Encounter for immunization: Secondary | ICD-10-CM

## 2012-03-03 DIAGNOSIS — F411 Generalized anxiety disorder: Secondary | ICD-10-CM

## 2012-03-03 DIAGNOSIS — R0789 Other chest pain: Secondary | ICD-10-CM

## 2012-03-03 DIAGNOSIS — R5383 Other fatigue: Secondary | ICD-10-CM

## 2012-03-03 LAB — POCT CBC
Lymph, poc: 1.9 (ref 0.6–3.4)
MCH, POC: 31.4 pg — AB (ref 27–31.2)
MCHC: 33.8 g/dL (ref 31.8–35.4)
MCV: 92.7 fL (ref 80–97)
MID (cbc): 0.2 (ref 0–0.9)
MPV: 9.1 fL (ref 0–99.8)
POC LYMPH PERCENT: 40.7 %L (ref 10–50)
POC MID %: 5.1 %M (ref 0–12)
Platelet Count, POC: 256 10*3/uL (ref 142–424)
RBC: 4.27 M/uL (ref 4.04–5.48)
RDW, POC: 12.4 %
WBC: 4.6 10*3/uL (ref 4.6–10.2)

## 2012-03-03 MED ORDER — TETANUS-DIPHTH-ACELL PERTUSSIS 5-2.5-18.5 LF-MCG/0.5 IM SUSP
0.5000 mL | Freq: Once | INTRAMUSCULAR | Status: AC
  Administered 2012-03-03: 0.5 mL via INTRAMUSCULAR

## 2012-03-03 NOTE — Progress Notes (Signed)
Patient ID: Mary Wyatt MRN: [id number], DOB: 1986/07/22, 26 y.o. Date of Encounter: 03/03/2012, 2:02 PM  Primary Physician: No primary provider on file.  Chief Complaint: Chest pain  HPI: 26 y.o. year old female with history below presents with several issues.  1) Chest pain has continued since her last office visit. Chest pain is located along the inferior aspect of the sternum and radiates superiorly up to the throat. Today it is the worst as it has been for "awhile." Pain is currently 6/10. No radiation, diaphoresis, nausea, vomiting, SOB, wheezing, palpitations, or edema. There is no neck or shoulder/arm pain, numbness, or tingling. She has previously seen Dr. Collene Mares for this as it was believed to be GI in etiology. She has had a trial of omeprazole, Zegrid, and Dexilant without success in treating her possible reflux. Today she states her flare up of chest pain was triggered by the painting at her work. She denies any family history of cardiac disease. She is preparing to go out to live on an [place] for 7 months at the end of June, and she would like to have this evaluated before then. Her chest pain was first mentioned to me in February 2013, at which time she was found to have an EKG with NSR and a negative CXR. At her subsequent office visits she has mentioned that her chest pain had resolved and it was felt that there was an anxiety component to her symptoms, so she was started on Xanax 0.5 mg bid. This seemed to have helped her, but she has stopped taking the Xanax as she states it made her feel "foggy headed." She denies any alcohol, tobacco use, or illicit substances.  2) She also mentions a new cough that is occasionally productive. There has been some post nasal drip lately as the season has changed. This has twice produced a small amount of blood tinged sputum. No SOB, wheezing, dyspnea, or edema. She does not feel like this is an illness. Has remained afebrile trough out. Her  reflux treatment above has not offered any relief to her cough.  3) Her abdominal pain remains the same along the right and left lower quadrants. She did have a CT abdomen and pelvis on 02/20/12 that was unremarkable other than bilateral ovarian cysts, likely physiologic. She has previously had a normal abdominal ultrasound in January 2013, as well as a normal pelvic/transvaginal ultrasound in January 2013.  She did have an EGD the previous week, ordered by GI, that I do not yet have the results of. Although she reports that she was told her small intestine was "inflammed" and that biopsies were sent off. She has had a negative gluten allergy work up as well. She is having a HIDA scan on 03/05/12, and also has a laparoscopy scheduled for 03/12/12. She has routinely had normal chemistries.   4) She also requests to update her tetanus vaccine. She is not certain when her last was, and [place] this today. No injury or trauma.    Past Medical History  Diagnosis Date  . Interstitial cystitis      Home Meds: Prior to Admission medications   Medication Sig Start Date End Date Taking? Authorizing Provider  cyclobenzaprine (FLEXERIL) 5 MG tablet 1 po qhs 12/03/11  Yes Kieli Golladay M Osamu Olguin, PA-C  Dexlansoprazole (DEXILANT PO) Take by mouth.   Yes Historical Provider, MD  Norgestim-Eth Estrad Triphasic (ORTHO TRI-CYCLEN, 28, PO) Take  by mouth daily.   Yes Historical Provider, MD  ALPRAZolam Duanne Moron) 0.5 MG tablet Take 1 tablet (0.5 mg total) by mouth 3 (three) times daily as needed for anxiety. 02/16/12  No Chelle S Jeffery, PA-C  omeprazole (PRILOSEC) 20 MG capsule Take 1 capsule (20 mg total) by mouth 2 (two) times daily. 01/29/12 01/28/13 No Rise Mu, PA-C    Allergies:  Allergies  Allergen Reactions  . Fluconazole In Dextrose Hives    History   Social History  . Marital Status: Single    Spouse Name: N/A    Number of Children: N/A  . Years of  Education: N/A   Occupational History  . Not on file.   Social History Main Topics  . Smoking status: Former Research scientist (life sciences)  . Smokeless tobacco: Not on file  . Alcohol Use: Yes     ocas  . Drug Use: No  . Sexually Active: Not Currently   Other Topics Concern  . Not on file   Social History Narrative  . No narrative on file     Review of Systems: Constitutional: negative for chills, fever, night sweats, weight changes, or fatigue  HEENT: negative for vision changes, hearing loss, congestion, rhinorrhea, ST, epistaxis, or sinus pressure Cardiovascular: see above Respiratory: negative for hemoptysis, wheezing, or shortness of breath Abdominal:see above Dermatological: negative for rash Neurologic: negative for headache, dizziness, or syncope All other systems reviewed and are otherwise negative with the exception to those above and in the HPI.   Physical Exam: Blood pressure 109/72, pulse 80, temperature 98.4 F (36.9 C), resp. rate 16, height 5' 5.5" (1.664 m), weight 132 lb 3.2 oz (59.966 kg)., Body mass index is 21.66 kg/(m^2). General: Well developed, well nourished, in no acute distress. Head: Normocephalic, atraumatic, eyes without discharge, sclera non-icteric, nares are without discharge. Bilateral auditory canals clear, TM's are without perforation, pearly grey and translucent with reflective cone of light bilaterally. Oral cavity moist, posterior pharynx without exudate, erythema, peritonsillar abscess, or post nasal drip.  Neck: Supple. No thyromegaly. Full ROM. No lymphadenopathy. Lungs: Clear bilaterally to auscultation without wheezes, rales, or rhonchi. Breathing is unlabored. Heart: RRR with S1 S2. No murmurs, rubs, or gallops appreciated. Abdomen: Soft, non-distended with normoactive bowel sounds. TTP over the epigastrium. No hepatosplenomegaly. No rebound/guarding. No obvious abdominal masses. Negative McBurney's, Rovsing's, Iliopsoas, and table jar.  Msk:  Strength  and tone normal for age. Extremities/Skin: Warm and dry. No clubbing or cyanosis. No edema. No rashes or suspicious lesions. Neuro: Alert and oriented X 3. Moves all extremities spontaneously. Gait is normal. CNII-XII grossly in tact. Psych:  Responds to questions appropriately with a normal affect.   Labs: Results for orders placed in visit on 03/03/12  POCT CBC      Component Value Range   WBC 4.6  4.6 - 10.2 (K/uL)   Lymph, poc 1.9  0.6 - 3.4    POC LYMPH PERCENT 40.7  10 - 50 (%L)   MID (cbc) 0.2  0 - 0.9    POC MID % 5.1  0 - 12 (%M)   POC Granulocyte 2.5  2 - 6.9    Granulocyte percent 54.2  37 - 80 (%G)   RBC 4.27  4.04 - 5.48 (M/uL)   Hemoglobin 13.4  12.2 - 16.2 (g/dL)   HCT, POC 39.6  37.7 - 47.9 (%)   MCV 92.7  80 - 97 (fL)   MCH, POC 31.4 (*) 27 - 31.2 (pg)   MCHC 33.8  31.8 - 35.4 (g/dL)   RDW, POC 12.4     Platelet Count, POC 256  142 - 424 (K/uL)   MPV 9.1  0 - 99.8 (fL)   TSH and BNP pending.  [facility] reading (PRIMARY) by  Dr. Everlene Farrier. Mild thoracic scoliosis o/w negative  EKG: sinus arrythmia    ASSESSMENT AND PLAN:  26 y.o. year old female with epigastric pain, chest pain, cough, anxiety, and need for tetanus vaccine. 1. Epigastric pain -Stable -Continue with treatment plan and evaluation as outlined by GI -Follow up after procedures/studies  2. Chest pain -Referral to cardiology for evaluation  3. Cough -Secondary to post nasal drip -Await labs -Should this continue/worsen plan for pulmonology referral  4. Anxiety -Patient understands this is playing a role in her above symptoms -She will continue her Xanax prn -Open to counseling in the future  5. Tetanus vaccine -Given today in office  6. Discussed with Dr. Everlene Farrier   Signed, Christell Faith, PA-C 03/03/2012 2:02 PM

## 2012-03-04 ENCOUNTER — Encounter: Payer: Self-pay | Admitting: Cardiovascular Disease

## 2012-03-04 ENCOUNTER — Telehealth: Payer: Self-pay

## 2012-03-04 ENCOUNTER — Ambulatory Visit (INDEPENDENT_AMBULATORY_CARE_PROVIDER_SITE_OTHER): Payer: BC Managed Care – PPO | Admitting: Cardiovascular Disease

## 2012-03-04 DIAGNOSIS — R079 Chest pain, unspecified: Secondary | ICD-10-CM

## 2012-03-04 NOTE — Assessment & Plan Note (Addendum)
Mary Wyatt presents today with episodes of chest pain. Her EKG was reportedly normal yesterday at urgent care. I'm not able to view the EKG today.  She is tender along her sternum. I suspect that she may have costochondritis. Her exam is fairly normal. She may have a very soft systolic murmur. We'll get an echocardiogram for further evaluation.  I think the chances of her having any coronary artery disease are near 0 %.  [place]. I'll see her back on an as-needed basis.

## 2012-03-04 NOTE — Progress Notes (Signed)
    Mary Wyatt Date of Birth  24-Apr-1986       [facility]    [employer] [address], [address] [place], [place], [place]  [id number]   Mary [place], [place]  [id number] [phone]     [phone]   Fax  [phone]    Fax [phone]  Problem List: 1. Chest pain  History of Present Illness:  S she's been treating this with antacids and other GI-related medications but has not noticed much relief. Mary Wyatt is a 26 yo with hx of chest pain / pressure.   She's not noticed any difference with exertion. The pain is there almost all the time. There could be a pleuritic component to the chest pain. The pain is not related to eating  or drinking.  There is no worsening with any activity.  She works at a desk job. She occasionally will rides stationary bike.  She is moving to the [place] in a month.   Current Outpatient Prescriptions on File Prior to Visit  Medication Sig Dispense Refill  . ALPRAZolam (XANAX) 0.5 MG tablet Take 1 tablet (0.5 mg total) by mouth 3 (three) times daily as needed for anxiety.  90 tablet  0  . Dexlansoprazole (DEXILANT PO) Take by mouth.      Mary Wyatt Triphasic (ORTHO TRI-CYCLEN, 28, PO) Take by mouth daily.      Marland Kitchen DISCONTD: omeprazole (PRILOSEC) 20 MG capsule Take 1 capsule (20 mg total) by mouth 2 (two) times daily.  60 capsule  3    Allergies  Allergen Reactions  . Fluconazole In Dextrose Hives    Past Medical History  Diagnosis Date  . Interstitial cystitis   . Chest pain     Past Surgical History  Procedure Date  . Tonsillectomy     History  Smoking status  . Former Smoker  Smokeless tobacco  . Not on file    History  Alcohol Use  . Yes    Occas.    No family history on file.  Reviw of Systems:  Reviewed in the HPI.  All other systems are negative.  Physical Exam: Blood pressure 106/68, pulse 85, height 5' 5.5" (1.664 m), weight 133 lb 12.8 oz (60.691 kg). General: Well  developed, well nourished, in no acute distress.  Head: Normocephalic, atraumatic, sclera non-icteric, mucus membranes are moist,   Neck: Supple. Carotids are 2 + without bruits. No JVD  Lungs: Clear bilaterally to auscultation.  She is tender along her sternum.  Heart: regular rate.  normal  S1 S2. No murmurs, gallops or rubs.  Abdomen: Soft, non-tender, non-distended with normal bowel sounds. No hepatomegaly. No rebound/guarding. No masses.  Msk:  Strength and tone are normal  Extremities: No clubbing or cyanosis. No edema.  Distal pedal pulses are 2+ and equal bilaterally.  Neuro: Alert and oriented X 3. Moves all extremities spontaneously.  Psych:  Responds to questions appropriately with a normal affect.  ECG Done yesterday - reportedly normal.  ECG is not available.  Assessment / Plan:

## 2012-03-04 NOTE — Patient Instructions (Signed)
Your physician has requested that you have an echocardiogram. Echocardiography is a painless test that uses sound waves to create images of your heart. It provides your doctor with information about the size and shape of your heart and how well your heart's chambers and valves are working. This procedure takes approximately one hour. There are no restrictions for this procedure.   Your physician recommends that you schedule a follow-up appointment in: as needed basis

## 2012-03-04 NOTE — Telephone Encounter (Signed)
Dr Acie Fredrickson is seeing pt per Christell Faith and he cant see the ekg report from yesterday or the ekg done in feb. Please send results via epic [phone] if need to contact him.

## 2012-03-05 ENCOUNTER — Ambulatory Visit (HOSPITAL_COMMUNITY)
Admission: RE | Admit: 2012-03-05 | Discharge: 2012-03-05 | Disposition: A | Discharge status: Home / Self Care | Payer: BC Managed Care – PPO | Source: Ambulatory Visit | Attending: Gastroenterology | Admitting: Gastroenterology

## 2012-03-05 DIAGNOSIS — R1013 Epigastric pain: Secondary | ICD-10-CM | POA: Insufficient documentation

## 2012-03-05 DIAGNOSIS — R110 Nausea: Secondary | ICD-10-CM | POA: Insufficient documentation

## 2012-03-05 MED ORDER — TECHNETIUM TC 99M MEBROFENIN IV KIT
5.0000 | PACK | Freq: Once | INTRAVENOUS | Status: AC | PRN
  Administered 2012-03-05: 5 via INTRAVENOUS

## 2012-03-05 NOTE — Telephone Encounter (Signed)
Called [facility] to let them know that I scanned in the system to ryan dunn's ov on 01/26/12.

## 2012-03-08 ENCOUNTER — Ambulatory Visit: Payer: BC Managed Care – PPO | Admitting: Physician Assistant

## 2012-03-09 ENCOUNTER — Ambulatory Visit (HOSPITAL_COMMUNITY): Payer: BC Managed Care – PPO | Attending: Cardiology

## 2012-03-09 ENCOUNTER — Other Ambulatory Visit: Payer: Self-pay

## 2012-03-09 DIAGNOSIS — I379 Nonrheumatic pulmonary valve disorder, unspecified: Secondary | ICD-10-CM | POA: Insufficient documentation

## 2012-03-09 DIAGNOSIS — R072 Precordial pain: Secondary | ICD-10-CM | POA: Insufficient documentation

## 2012-03-09 DIAGNOSIS — I079 Rheumatic tricuspid valve disease, unspecified: Secondary | ICD-10-CM | POA: Insufficient documentation

## 2012-03-09 DIAGNOSIS — I059 Rheumatic mitral valve disease, unspecified: Secondary | ICD-10-CM | POA: Insufficient documentation

## 2012-03-12 ENCOUNTER — Other Ambulatory Visit: Payer: Self-pay | Admitting: Obstetrics and Gynecology

## 2012-03-12 ENCOUNTER — Other Ambulatory Visit (HOSPITAL_COMMUNITY)
Admission: RE | Admit: 2012-03-12 | Discharge: 2012-03-12 | Disposition: A | Discharge status: Home / Self Care | Payer: BC Managed Care – PPO | Source: Ambulatory Visit | Attending: Obstetrics and Gynecology | Admitting: Obstetrics and Gynecology

## 2012-03-12 DIAGNOSIS — N83209 Unspecified ovarian cyst, unspecified side: Secondary | ICD-10-CM | POA: Insufficient documentation

## 2012-03-23 ENCOUNTER — Telehealth (INDEPENDENT_AMBULATORY_CARE_PROVIDER_SITE_OTHER): Payer: Self-pay | Admitting: General Surgery

## 2012-03-23 NOTE — Telephone Encounter (Signed)
Lisa from Dr. Lorie Apley office called asking if patient could be seen sooner. Patient will be moving the week of the 25th and they wanted her to be evaluated before she moves.  She stated patient dealing with nausea and that she will fax over the information as soon as possible. I advised that if the patient is moving out of the city she needs to let our office know if she is going to continue under Dr. Cristal Generous care or finding a new doctor when she moves.

## 2012-03-23 NOTE — Telephone Encounter (Signed)
Patient is scheduled 03/25/12 with Dr Zella Richer in the system.

## 2012-03-25 ENCOUNTER — Ambulatory Visit (INDEPENDENT_AMBULATORY_CARE_PROVIDER_SITE_OTHER): Payer: BC Managed Care – PPO | Admitting: General Surgery

## 2012-03-30 ENCOUNTER — Ambulatory Visit (INDEPENDENT_AMBULATORY_CARE_PROVIDER_SITE_OTHER): Payer: BC Managed Care – PPO | Admitting: General Surgery

## 2012-04-09 ENCOUNTER — Ambulatory Visit (INDEPENDENT_AMBULATORY_CARE_PROVIDER_SITE_OTHER): Payer: BC Managed Care – PPO | Admitting: Physician Assistant

## 2012-04-09 VITALS — BP 121/72 | HR 62 | Temp 98.3°F | Resp 19 | Ht 65.0 in | Wt 129.0 lb

## 2012-04-09 DIAGNOSIS — F419 Anxiety disorder, unspecified: Secondary | ICD-10-CM

## 2012-04-09 DIAGNOSIS — R109 Unspecified abdominal pain: Secondary | ICD-10-CM

## 2012-04-09 DIAGNOSIS — F411 Generalized anxiety disorder: Secondary | ICD-10-CM

## 2012-04-09 MED ORDER — ONDANSETRON 4 MG PO TBDP: 4.0000 mg | ORAL_TABLET | Freq: Three times a day (TID) | ORAL | Status: AC | PRN

## 2012-04-09 MED ORDER — MELOXICAM 7.5 MG PO TABS: 7.5000 mg | ORAL_TABLET | Freq: Every day | ORAL | Status: DC

## 2012-04-09 MED ORDER — ALPRAZOLAM 0.5 MG PO TABS: 0.5000 mg | ORAL_TABLET | Freq: Three times a day (TID) | ORAL | Status: DC | PRN

## 2012-04-09 NOTE — Progress Notes (Signed)
Patient ID: Mary Wyatt MRN: [id number], DOB: 01-Feb-1986, 26 y.o. Date of Encounter: 04/09/2012, 2:45 PM  Primary Physician: Marcille Blanco  Chief Complaint: Follow up  HPI: 26 y.o. year old female with history below presents for follow up of anxiety, abdominal pain, and chest pain. Since I last saw her she has been seen by the cardiologist for her chest pain, had an essentially normal echo. Her pain was felt to be secondary to costochondritis. No treatment was initiated. She does still have occasional episodes of sternal chest pain. None currently. These are not associated with exertion, activity, no SOB, wheezing, neck pain, shoulder, arm pain, or edema.   She had a laparoscopy that did reveal some endometriosis. Has been advised by her general surgeon and OB to continue her birth control for this condition. This is stable, and she is ok with this plan.   She also had a normal EGD and a HIDA scan that revealed a slightly decreased EF of the gallbladder. There has not been follow up on this at this time. She has been told there may be a gluten sensitivity. She is hesitant to believe this, but is slowly changing her diet. At this time she is uncertain if this has improved her symptoms.  She did recently reintroduce milk into her diet, which caused some nausea, cramping, and diarrhea. Since she has stopped the milk these symptoms have resolved.   She does continue to take her Xanax 0.5 mg tid prn. She requests a refill of this today. Doing well with this medication at this time.   She is moving to [facility] in 2 days for a minimum of 6 months, and possibly permanently. She is considering locating a pulmonologist and GI doctor there for further opinions.   Past Medical History  Diagnosis Date  . Interstitial cystitis   . Chest pain   . Anxiety      Home Meds: Prior to Admission medications   Medication Sig Start Date End Date Taking? Authorizing Provider  ALPRAZolam  Duanne Moron) 0.5 MG tablet Take 1 tablet (0.5 mg total) by mouth 3 (three) times daily as needed for anxiety. 02/16/12  Yes Chelle S Jeffery, PA-C  Norethin-Eth Estradiol-Fe (GENERESS FE PO) Take by mouth.   Yes Historical Provider, MD  Dexlansoprazole (DEXILANT PO) Take by mouth.   No Historical Provider, MD  Norgestim-Eth Estrad Triphasic (ORTHO TRI-CYCLEN, 28, PO) Take by mouth daily.   Yes Historical Provider, MD  omeprazole (PRILOSEC) 20 MG capsule Take 20 mg by mouth as needed. 01/29/12 01/28/13 No Rise Mu, PA-C    Allergies:  Allergies  Allergen Reactions  . Diflucan (Fluconazole) Hives  . Fluconazole In Dextrose Hives    History   Social History  . Marital Status: Single    Spouse Name: N/A    Number of Children: N/A  . Years of Education: N/A   Occupational History  . Not on file.   Social History Main Topics  . Smoking status: Never Smoker   . Smokeless tobacco: Not on file  . Alcohol Use: Yes     Occas.  . Drug Use: No  . Sexually Active: Not Currently   Other Topics Concern  . Not on file   Social History Narrative  . No narrative on file     Review of Systems: Constitutional: negative for chills, fever, night sweats, weight changes, or fatigue  HEENT: negative for vision changes, hearing loss, congestion, rhinorrhea, ST, epistaxis, or sinus pressure Cardiovascular: negative  for palpitations, edema, SOB, or cough Respiratory: negative for hemoptysis, wheezing, shortness of breath, or cough Abdominal: negative for vomiting, or constipation Dermatological: negative for rash Neurologic: negative for headache, dizziness, or syncope All other systems reviewed and are otherwise negative with the exception to those above and in the HPI.   Physical Exam: Blood pressure 121/72, pulse 62, temperature 98.3 F (36.8 C), temperature source Oral, resp. rate 19, height 5' 5"  (1.651 m), weight 129 lb (58.514 kg), last menstrual period 04/08/2012, SpO2 100.00%., Body mass  index is 21.47 kg/(m^2). General: Well developed, well nourished, in no acute distress. Head: Normocephalic, atraumatic, eyes without discharge, sclera non-icteric, nares are without discharge. Bilateral auditory canals clear, TM's are without perforation, pearly grey and translucent with reflective cone of light bilaterally. Oral cavity moist, posterior pharynx without exudate, erythema, peritonsillar abscess, or post nasal drip.  Neck: Supple. No thyromegaly. Full ROM. No lymphadenopathy. Lungs: Clear bilaterally to auscultation without wheezes, rales, or rhonchi. Breathing is unlabored. Heart: RRR with S1 S2. No murmurs, rubs, or gallops appreciated. Abdomen: Soft, non-distended with normoactive bowel sounds. Mild diffuse TTP through out, unchanged from her previous abdominal exams. No hepatosplenomegaly. No rebound/guarding. No obvious abdominal masses. Mcburney's, Rovsing's, Iliopsoas, and table jar all negative. Msk:  Strength and tone normal for age. Extremities/Skin: Warm and dry. No clubbing or cyanosis. No edema. No rashes or suspicious lesions. Neuro: Alert and oriented X 3. Moves all extremities spontaneously. Gait is normal. CNII-XII grossly in tact. Psych:  Responds to questions appropriately with a normal affect.     ASSESSMENT AND PLAN:  26 y.o. year old female with anxiety, abdominal pain likely secondary to endometriosis vs IC vs Celiac, and chest pain likely secondary to costochondritis -Continue Xanax 0.5 mg 1 po tid prn #90 no RF, may call for refills through January 2014 -Zofran 0.4 mg ODT #12 1As directed RF 11, [place] for [place] -Mobic 7.5 mg #30 1 po daily prn RF 0, trial for costochondritis -If chest pain returns or persists plan for pulmonology evaluation -She has been given ER precautions -[place] she is aware of our treatment plan and will continue her follow up at her new location.  -She will continue to  limit her gluten intake to see if this helps her symptoms -She can call should she need [place] any further for sending records -If she does come back to [place], she is to get back in contact with [place] for follow up, otherwise she will let us know where to transfer her records to.  Signed, Christell Faith, PA-C 04/09/2012 2:45 PM

## 2012-04-13 ENCOUNTER — Ambulatory Visit (INDEPENDENT_AMBULATORY_CARE_PROVIDER_SITE_OTHER): Payer: BC Managed Care – PPO | Admitting: General Surgery

## 2012-07-24 ENCOUNTER — Other Ambulatory Visit: Payer: Self-pay

## 2012-07-24 DIAGNOSIS — F419 Anxiety disorder, unspecified: Secondary | ICD-10-CM

## 2012-07-24 MED ORDER — ALPRAZOLAM 0.5 MG PO TABS: 0.5000 mg | ORAL_TABLET | Freq: Three times a day (TID) | ORAL | Status: DC | PRN

## 2012-08-05 ENCOUNTER — Ambulatory Visit (INDEPENDENT_AMBULATORY_CARE_PROVIDER_SITE_OTHER): Payer: BC Managed Care – PPO | Admitting: Emergency Medicine

## 2012-08-05 VITALS — BP 112/80 | HR 100 | Temp 98.4°F | Resp 16 | Ht 65.5 in | Wt 129.6 lb

## 2012-08-05 DIAGNOSIS — B9689 Other specified bacterial agents as the cause of diseases classified elsewhere: Secondary | ICD-10-CM

## 2012-08-05 DIAGNOSIS — N760 Acute vaginitis: Secondary | ICD-10-CM

## 2012-08-05 DIAGNOSIS — N898 Other specified noninflammatory disorders of vagina: Secondary | ICD-10-CM

## 2012-08-05 LAB — POCT WET PREP WITH KOH
KOH Prep POC: NEGATIVE
RBC Wet Prep HPF POC: NEGATIVE

## 2012-08-05 MED ORDER — TINIDAZOLE 500 MG PO TABS: 1.0000 g | ORAL_TABLET | Freq: Every day | ORAL | Status: DC

## 2012-08-05 MED ORDER — METRONIDAZOLE 500 MG PO TABS: 500.0000 mg | ORAL_TABLET | Freq: Two times a day (BID) | ORAL | Status: DC

## 2012-08-05 NOTE — Progress Notes (Signed)
[facility] [address], [place] [address] [phone]  Date:  08/05/2012   Name:  Mary Wyatt   DOB:  [birth year]   MRN:  [id number]  PCP:  Marcille Blanco    Chief Complaint: Vaginal Discharge   History of Present Illness:  Mary Wyatt is a 26 y.o. very pleasant female patient who presents with the following:  Has vaginal discharge and itch.  Had intercourse in past with a condom that broke.  She is concerned that she should also be tested for [facility] and Chlamydia.  Has no pelvic pain  History of frequent yeast infections  Patient Active Problem List  Diagnosis  . Interstitial cystitis  . Chest pain    Past Medical History  Diagnosis Date  . Interstitial cystitis   . Chest pain   . Anxiety     Past Surgical History  Procedure Date  . Tonsillectomy     History  Substance Use Topics  . Smoking status: Never Smoker   . Smokeless tobacco: Not on file  . Alcohol Use: Yes     Occas.    No family history on file.  Allergies  Allergen Reactions  . Diflucan (Fluconazole) Hives  . Fluconazole In Dextrose Hives    Medication list has been reviewed and updated.  Current Outpatient Prescriptions on File Prior to Visit  Medication Sig Dispense Refill  . ALPRAZolam (XANAX) 0.5 MG tablet Take 1 tablet (0.5 mg total) by mouth 3 (three) times daily as needed for anxiety.  90 tablet  0  . Norethin-Eth Estradiol-Fe (GENERESS FE PO) Take by mouth.      . Dexlansoprazole (DEXILANT PO) Take by mouth.      . meloxicam (MOBIC) 7.5 MG tablet Take 1 tablet (7.5 mg total) by mouth daily.  30 tablet  11  . Norgestim-Eth Estrad Triphasic (ORTHO TRI-CYCLEN, 28, PO) Take by mouth daily.      Marland Kitchen omeprazole (PRILOSEC) 20 MG capsule Take 20 mg by mouth as needed.        Review of Systems:  As per HPI, otherwise negative.    Physical Examination: Filed Vitals:   08/05/12 1443  BP: 112/80  Pulse: 100  Temp: 98.4 F (36.9 C)  Resp: 16    Filed Vitals:   08/05/12 1443  Height: 5' 5.5" (1.664 m)  Weight: 129 lb 9.6 oz (58.786 kg)   Body mass index is 21.24 kg/(m^2). Ideal Body Weight: Weight in (lb) to have BMI = 25: 152.2    GEN: WDWN, NAD, Non-toxic, Alert & Oriented x 3 HEENT: Atraumatic, Normocephalic.  Ears and Nose: No external deformity. EXTR: No clubbing/cyanosis/edema NEURO: Normal gait.  PSYCH: Normally interactive. Conversant. Not depressed or anxious appearing.  Calm demeanor.  Pelvic - normal external genitalia, vulva, vagina, cervix, uterus and adnexa  Moderate discharge   Assessment and Plan: BV Flagyl Follow up as needed  Roselee Culver, MD  Results for orders placed in visit on 08/05/12  POCT WET PREP WITH KOH      Component Value Range   Trichomonas, UA Negative     Clue Cells Wet Prep HPF POC 50%     Epithelial Wet Prep HPF POC 5-8     Yeast Wet Prep HPF POC neg     Bacteria Wet Prep HPF POC moderate     RBC Wet Prep HPF POC neg     WBC Wet Prep HPF POC 20+     KOH Prep  POC Negative     I have reviewed and agree with documentation. Robert P. Laney Pastor, M.D.

## 2012-08-06 LAB — GC/CHLAMYDIA PROBE AMP, GENITAL
Chlamydia, DNA Probe: NEGATIVE
GC Probe Amp, Genital: NEGATIVE

## 2012-12-08 ENCOUNTER — Ambulatory Visit (INDEPENDENT_AMBULATORY_CARE_PROVIDER_SITE_OTHER): Payer: BC Managed Care – PPO | Admitting: Family Medicine

## 2012-12-08 VITALS — BP 126/74 | HR 101 | Temp 99.3°F | Resp 17 | Ht 65.5 in | Wt 134.0 lb

## 2012-12-08 DIAGNOSIS — R5381 Other malaise: Secondary | ICD-10-CM

## 2012-12-08 DIAGNOSIS — R109 Unspecified abdominal pain: Secondary | ICD-10-CM

## 2012-12-08 DIAGNOSIS — F419 Anxiety disorder, unspecified: Secondary | ICD-10-CM

## 2012-12-08 DIAGNOSIS — N390 Urinary tract infection, site not specified: Secondary | ICD-10-CM

## 2012-12-08 DIAGNOSIS — R5383 Other fatigue: Secondary | ICD-10-CM

## 2012-12-08 DIAGNOSIS — F411 Generalized anxiety disorder: Secondary | ICD-10-CM

## 2012-12-08 DIAGNOSIS — R110 Nausea: Secondary | ICD-10-CM

## 2012-12-08 LAB — COMPREHENSIVE METABOLIC PANEL
ALT: 19 U/L (ref 0–35)
CO2: 27 mEq/L (ref 19–32)
Calcium: 9.8 mg/dL (ref 8.4–10.5)
Chloride: 103 mEq/L (ref 96–112)
Creat: 0.66 mg/dL (ref 0.50–1.10)
Glucose, Bld: 85 mg/dL (ref 70–99)
Sodium: 137 mEq/L (ref 135–145)
Total Bilirubin: 0.4 mg/dL (ref 0.3–1.2)
Total Protein: 7.4 g/dL (ref 6.0–8.3)

## 2012-12-08 LAB — POCT CBC
Granulocyte percent: 54.4 %G (ref 37–80)
HCT, POC: 38.5 % (ref 37.7–47.9)
Hemoglobin: 12.6 g/dL (ref 12.2–16.2)
MCH, POC: 31.4 pg — AB (ref 27–31.2)
MCV: 96 fL (ref 80–97)
MID (cbc): 0.2 (ref 0–0.9)
RBC: 4.01 M/uL — AB (ref 4.04–5.48)
WBC: 4.1 10*3/uL — AB (ref 4.6–10.2)

## 2012-12-08 LAB — POCT URINALYSIS DIPSTICK
Bilirubin, UA: NEGATIVE
Glucose, UA: NEGATIVE
Ketones, UA: NEGATIVE
Spec Grav, UA: 1.02
Urobilinogen, UA: 0.2

## 2012-12-08 LAB — POCT UA - MICROSCOPIC ONLY: Crystals, Ur, HPF, POC: NEGATIVE

## 2012-12-08 LAB — POCT URINE PREGNANCY: Preg Test, Ur: NEGATIVE

## 2012-12-08 LAB — TSH: TSH: 1.837 u[IU]/mL (ref 0.350–4.500)

## 2012-12-08 MED ORDER — ALPRAZOLAM 0.5 MG PO TABS: 0.5000 mg | ORAL_TABLET | Freq: Three times a day (TID) | ORAL | Status: DC | PRN

## 2012-12-08 MED ORDER — CIPROFLOXACIN HCL 500 MG PO TABS: 500.0000 mg | ORAL_TABLET | Freq: Two times a day (BID) | ORAL | Status: DC

## 2012-12-08 NOTE — Progress Notes (Signed)
Patient ID: Mary Wyatt MRN: [id number], DOB: Jan 06, 1986, 27 y.o. Date of Encounter: 12/08/2012, 11:51 AM  Primary Physician: Marcille Blanco  Chief Complaint: Abdominal pain, nausea, and fatigue  HPI: 27 y.o. year old female with history below presents with recurrence of abdominal pain, nausea, and fatigue. Patient states this episode began 2 weeks ago and has steadily worsened over time. Pain is greatest along the epigastric region and along the right lower abdomen. This pain is similar to the abdominal that she has had in the past with previous episodes. It is associated with some nausea without emesis. [place]or[place] her abdominal pain had improved considerably. At that time she does not remember any specific lifestyle changes. She recently has reintroduced glutin back into her diet because she was not 100% sure that she is intolerant, however upon doing so the above seems to have worsened. No diarrhea, urinary frequency, urgency, or dysuria. No vaginal complaints. She has remained afebrile and without chills. Of note, she is seeing her gynecologist today for a follow up Pap smear secondary to a previously abnormal result. She also mentions some off and on chest tightness that is consistent with her previous episodes. No frank chest pain. Negative Virchow's triad.   She does state that she gets anxious thinking about the above and maybe that causes the above to worsen. She had to take her Xanax daily. She requests a refill. She understands that anxiety is playing a role in her above symptoms. She mentions that she does not want to have a laparoscopy every time that she has abdominal pain.   Past Medical History  Diagnosis Date  . Interstitial cystitis   . Chest pain   . Anxiety   . Allergy   . Depression   . Anemia   . Ulcer      Home Meds: Prior to Admission medications   Medication Sig Start Date End Date Taking?  Authorizing Provider  ALPRAZolam Duanne Moron) 0.5 MG tablet Take 1 tablet (0.5 mg total) by mouth 3 (three) times daily as needed for anxiety. 07/24/12  Yes [place], PA-C  Norethin-Eth Estradiol-Fe (GENERESS FE PO) Take by mouth.   Yes Historical Provider, MD  Dexlansoprazole (DEXILANT PO) Take by mouth.   No Historical Provider, MD  meloxicam (MOBIC) 7.5 MG tablet Take 1 tablet (7.5 mg total) by mouth daily. 04/09/12 04/09/13 No Tauheed Mcfayden M Merick Kelleher, PA-C         Norgestim-Eth Estrad Triphasic (ORTHO TRI-CYCLEN, 28, PO) Take by mouth daily.   No Historical Provider, MD  omeprazole (PRILOSEC) 20 MG capsule Take 20 mg by mouth as needed. 01/29/12 01/28/13 No Rise Mu, PA-C           Allergies:  Allergies  Allergen Reactions  . Diflucan (Fluconazole) Hives  . Fluconazole In Dextrose Hives    History   Social History  . Marital Status: Single    Spouse Name: N/A    Number of Children: N/A  . Years of Education: N/A   Occupational History  . Not on file.   Social History Main Topics  . Smoking status: Never Smoker   . Smokeless tobacco: Not on file  . Alcohol Use: Yes     Comment: Occas.  . Drug Use: No  . Sexually Active: Not Currently   Other Topics Concern  . Not on file   Social History Narrative  . No narrative  on file     Review of Systems: Constitutional: positive for fatigue. negative for chills, fever, night sweats, or weight changes  HEENT: negative for vision changes, hearing loss, congestion, rhinorrhea, ST, epistaxis, or sinus pressure Cardiovascular: negative for chest pain, edema, DOE, orthopnea, or palpitations Respiratory: negative for hemoptysis, wheezing, shortness of breath, or cough Abdominal: positive for abdominal pain and nausea. negative for vomiting, diarrhea, constipation, BRBPR, or melena Genitourinary: negative for urinary frequency, urgency, dysuria, or nocturia  Vaginal: negative for discharge, abnormal bleeding, itching, or dyspareunia    Dermatological: negative for rash Psychological: positive for anxiety. negative for depression, anhedonia, SI, or HI  Neurologic: negative for headache, dizziness, or syncope   Physical Exam: Blood pressure 126/74, pulse 101, temperature 99.3 F (37.4 C), temperature source Oral, resp. rate 17, height 5' 5.5" (1.664 m), weight 134 lb (60.782 kg), SpO2 97.00%., Body mass index is 21.95 kg/(m^2). Pulse recheck at 87, Pulse ox recheck at 100% General: Well developed, well nourished, in no acute distress. Not toxic appearing.  Head: Normocephalic, atraumatic, eyes without discharge, sclera non-icteric, nares are without discharge. Bilateral auditory canals clear, TM's are without perforation, pearly grey and translucent with reflective cone of light bilaterally. Oral cavity moist, posterior pharynx without exudate, erythema, peritonsillar abscess, or post nasal drip.  Neck: Supple. No thyromegaly. Full ROM. No lymphadenopathy. Lungs: Clear bilaterally to auscultation without wheezes, rales, or rhonchi. Breathing is unlabored. Heart: RRR with S1 S2. No murmurs, rubs, or gallops appreciated. Abdomen: Soft, non-distended with normoactive bowel sounds. TTP LUQ and RLQ without rebound. No hepatosplenomegaly. No rebound/guarding. No obvious abdominal masses. Negative iliopsoas and table jar test.  Msk:  Strength and tone normal for age. Extremities/Skin: Warm and dry. No clubbing or cyanosis. No edema. No rashes or suspicious lesions. Neuro: Alert and oriented X 3. Moves all extremities spontaneously. Gait is normal. CNII-XII grossly in tact. Psych:  Responds to questions appropriately with a normal affect.   Labs: Results for orders placed in visit on 12/08/12  POCT CBC      Result Value Range   WBC 4.1 (*) 4.6 - 10.2 K/uL   Lymph, poc 1.7  0.6 - 3.4   POC LYMPH PERCENT 40.4  10 - 50 %L   MID (cbc) 0.2  0 - 0.9   POC MID % 5.2  0 - 12 %M   POC Granulocyte 2.2  2 - 6.9   Granulocyte percent 54.4   37 - 80 %G   RBC 4.01 (*) 4.04 - 5.48 M/uL   Hemoglobin 12.6  12.2 - 16.2 g/dL   HCT, POC 38.5  37.7 - 47.9 %   MCV 96.0  80 - 97 fL   MCH, POC 31.4 (*) 27 - 31.2 pg   MCHC 32.7  31.8 - 35.4 g/dL   RDW, POC 12.1     Platelet Count, POC 260  142 - 424 K/uL   MPV 9.0  0 - 99.8 fL  POCT URINALYSIS DIPSTICK      Result Value Range   Color, UA YELLOW     Clarity, UA CLEAR     Glucose, UA NEG     Bilirubin, UA NEG     Ketones, UA NEG     Spec Grav, UA 1.020     Blood, UA TRACE     pH, UA 5.5     Protein, UA NEG     Urobilinogen, UA 0.2     Nitrite, UA NEG     Leukocytes, UA  Trace    POCT UA - MICROSCOPIC ONLY      Result Value Range   WBC, Ur, HPF, POC 0-3     RBC, urine, microscopic 2-5     Bacteria, U Microscopic 1+     Mucus, UA NEG     Epithelial cells, urine per micros 3-5     Crystals, Ur, HPF, POC NEG     Casts, Ur, LPF, POC NEG     Yeast, UA NEG    POCT URINE PREGNANCY      Result Value Range   Preg Test, Ur Negative     CMP,TTG, TSH, and urine culture pending  ASSESSMENT AND PLAN:  27 y.o. female with UTI, abdominal pain, nausea, fatigue, chest tightness, and anxiety 1) UTI -Cipro 500 mg 1 po bid #10 no RF -Push fluids -Await culture results  2) Abdominal pain -Declines CT scan -Long history of similar symptoms. She has been diagnosed with endometriosis and has noticed an improvement of her symptoms with the exclusion of glutin from her diet. -Advise her to continue her birth control pills for endometriosis and discuss this with her gynecologist at her appointment with them today. -Advised her to reconsider the exclusion of glutin from her diet. I will again check TTG, previously negative -Advised her that she should not have a laparoscopy every time she developed abdominl pain and that no one would continue to offer her this or continue to scan/exposure her to radiation. The risks of these procedures were explained to her.  -RTC/ER precautions  3)  Fatigue -She currently is not working,seasonal work as a Surveyor, mining, and is sleeping/longing around the house. This is likely playing a role in some of the above as well. -Given her normal-low hgb and high-normal MCV I will plan to trend her CBC over the next couple of months. Previously with a normal B12. Will add folate at follow up visit. If CBC continues to evolve will evaluate further. Drinks 3-5 beers per week. I have asked her to cut out the alcohol at this time. She agrees to do so.  -Await labs to trend  4) Chest tightness -Longstanding issue for her  -No chest pain -Vitals rechecked and further improved -She mentions anxiety "could be" playing a role in some of the above -Given ER/911 precautions  5) Anxiety -Refilled Xanax 0.5 mg 1 po tid prn #90 no RF -She again mentions that anxiety "could be" playing a role in some of the above -Ok to refill for 5 months   Signed, Christell Faith, PA-C 12/08/2012 11:51 AM

## 2012-12-10 ENCOUNTER — Ambulatory Visit (INDEPENDENT_AMBULATORY_CARE_PROVIDER_SITE_OTHER): Payer: BC Managed Care – PPO | Admitting: Physician Assistant

## 2012-12-10 VITALS — BP 102/59 | HR 71 | Temp 98.5°F | Resp 18 | Wt 134.0 lb

## 2012-12-10 DIAGNOSIS — R5383 Other fatigue: Secondary | ICD-10-CM

## 2012-12-10 DIAGNOSIS — R1011 Right upper quadrant pain: Secondary | ICD-10-CM

## 2012-12-10 DIAGNOSIS — R5381 Other malaise: Secondary | ICD-10-CM

## 2012-12-10 DIAGNOSIS — R110 Nausea: Secondary | ICD-10-CM

## 2012-12-10 LAB — URINE CULTURE: Colony Count: NO GROWTH

## 2012-12-10 NOTE — Progress Notes (Signed)
Patient ID: Mary Wyatt MRN: [id number], DOB: September 19, 1986, 27 y.o. Date of Encounter: 12/10/2012, 3:49 PM  Primary Physician: Marcille Blanco  Chief Complaint: Follow up abdominal pain, nausea, fatigue, and UTI  HPI: 27 y.o. female with history below presents for follow up of the above. She does feel a little better today compared to the previous day from both an abdominal pain and nausea standpoint. She did see her gynecologist who noted an inflamed cervix with some discharge. STD labs are pending. She has been started empirically on Doxycycline. She has had one new sexual partner since her genprobe here in October. We do not have the results of the study taken on 12/08/12, and upon calling their office no one is available to answer the phone.   She does request a second referral to a gastroenterologist for a second opinion. She would like to revisit having an evaluation of her gallbladder as well as discussing the possibility of a colonoscopy with them.    Past Medical History  Diagnosis Date  . Interstitial cystitis   . Chest pain   . Anxiety   . Allergy   . Depression   . Anemia   . Ulcer      Home Meds: Prior to Admission medications   Medication Sig Start Date End Date Taking? Authorizing Provider  ALPRAZolam Duanne Moron) 0.5 MG tablet Take 1 tablet (0.5 mg total) by mouth 3 (three) times daily as needed for anxiety. 12/08/12  Yes Keigo Whalley M Rashi Giuliani, PA-C  ciprofloxacin (CIPRO) 500 MG tablet Take 1 tablet (500 mg total) by mouth 2 (two) times daily. 12/08/12  Yes Mazzie Brodrick M Amariya Liskey, PA-C  Dexlansoprazole (DEXILANT PO) Take by mouth.   Yes Historical Provider, MD  doxycycline (DORYX) 100 MG DR capsule Take 100 mg by mouth 2 (two) times daily.   Yes Historical Provider, MD  meloxicam (MOBIC) 7.5 MG tablet Take 1 tablet (7.5 mg total) by mouth daily. 04/09/12 04/09/13 Yes Tanner Vigna M Moriah Shawley, PA-C  metroNIDAZOLE (FLAGYL) 500 MG tablet Take 1 tablet (500 mg total) by mouth 2 (two) times daily with a meal.  DO NOT CONSUME ALCOHOL WHILE TAKING THIS MEDICATION. 08/05/12  Yes Ellison Carwin, MD  Norethin-Eth Estradiol-Fe (GENERESS FE PO) Take by mouth.   Yes Historical Provider, MD  omeprazole (PRILOSEC) 20 MG capsule Take 20 mg by mouth as needed. 01/29/12 01/28/13 Yes Vanessa Kampf Lyn Hollingshead, PA-C    Allergies:  Allergies  Allergen Reactions  . Diflucan (Fluconazole) Hives  . Fluconazole In Dextrose Hives    History   Social History  . Marital Status: Single    Spouse Name: N/A    Number of Children: N/A  . Years of Education: N/A   Occupational History  . Not on file.   Social History Main Topics  . Smoking status: Never Smoker   . Smokeless tobacco: Not on file  . Alcohol Use: Yes     Comment: Occas.  . Drug Use: No  . Sexually Active: Not Currently   Other Topics Concern  . Not on file   Social History Narrative  . No narrative on file     Review of Systems: Constitutional: positive for fatigue. negative for chills, fever, night sweats, or weight changes  HEENT: negative for vision changes, hearing loss, congestion, rhinorrhea, ST, epistaxis, or sinus pressure Cardiovascular: negative for chest pain, tachycardia, edema, orthopnea, DOE, or palpitations Respiratory: negative for hemoptysis, wheezing, shortness of breath, or cough Abdominal: positive for abdominal pain and nausea. negative for vomiting,  diarrhea, constipation, BRBPR, or melena Dermatological: negative for rash Psychological: positive for anxiety. negative for depression, anhedonia, SI, or HI  Neurologic: negative for headache, dizziness, or syncope   Physical Exam: Blood pressure 102/59, pulse 71, temperature 98.5 F (36.9 C), temperature source Oral, resp. rate 18, weight 134 lb (60.782 kg)., Body mass index is 21.95 kg/(m^2). General: Well developed, well nourished, in no acute distress. Head: Normocephalic, atraumatic, eyes without discharge, sclera non-icteric, nares are without discharge. Bilateral auditory  canals clear, TM's are without perforation, pearly grey and translucent with reflective cone of light bilaterally. Oral cavity moist, posterior pharynx without exudate, erythema, peritonsillar abscess, or post nasal drip.  Neck: Supple. No thyromegaly. Full ROM. No lymphadenopathy. Lungs: Clear bilaterally to auscultation without wheezes, rales, or rhonchi. Breathing is unlabored. Heart: RRR with S1 S2. No murmurs, rubs, or gallops appreciated. Abdomen: Soft, non-distended with normoactive bowel sounds. TTP RUQ and epigastric region, otherwise no TTP. No hepatosplenomegaly. No rebound/guarding. No obvious abdominal masses. Negative McBurney's, Iliopsoas, and table jar.  Msk:  Strength and tone normal for age. Extremities/Skin: Warm and dry. No clubbing or cyanosis. No edema. No rashes or suspicious lesions. Neuro: Alert and oriented X 3. Moves all extremities spontaneously. Gait is normal. CNII-XII grossly in tact. Psych:  Responds to questions appropriately with a normal affect.   Labs: Results for orders placed in visit on 12/08/12  URINE CULTURE      Result Value Range   Colony Count NO GROWTH     Organism ID, Bacteria NO GROWTH    COMPREHENSIVE METABOLIC PANEL      Result Value Range   Sodium 137  135 - 145 mEq/L   Potassium 4.0  3.5 - 5.3 mEq/L   Chloride 103  96 - 112 mEq/L   CO2 27  19 - 32 mEq/L   Glucose, Bld 85  70 - 99 mg/dL   BUN 7  6 - 23 mg/dL   Creat 0.66  0.50 - 1.10 mg/dL   Total Bilirubin 0.4  0.3 - 1.2 mg/dL   Alkaline Phosphatase 39  39 - 117 U/L   AST 18  0 - 37 U/L   ALT 19  0 - 35 U/L   Total Protein 7.4  6.0 - 8.3 g/dL   Albumin 4.7  3.5 - 5.2 g/dL   Calcium 9.8  8.4 - 10.5 mg/dL  TSH      Result Value Range   TSH 1.837  0.350 - 4.500 uIU/mL  TISSUE TRANSGLUTAMINASE, IGA      Result Value Range   Tissue Transglutaminase Ab, IgA 9.5  <20 U/mL  POCT CBC      Result Value Range   WBC 4.1 (*) 4.6 - 10.2 K/uL   Lymph, poc 1.7  0.6 - 3.4   POC LYMPH PERCENT  40.4  10 - 50 %L   MID (cbc) 0.2  0 - 0.9   POC MID % 5.2  0 - 12 %M   POC Granulocyte 2.2  2 - 6.9   Granulocyte percent 54.4  37 - 80 %G   RBC 4.01 (*) 4.04 - 5.48 M/uL   Hemoglobin 12.6  12.2 - 16.2 g/dL   HCT, POC 38.5  37.7 - 47.9 %   MCV 96.0  80 - 97 fL   MCH, POC 31.4 (*) 27 - 31.2 pg   MCHC 32.7  31.8 - 35.4 g/dL   RDW, POC 12.1     Platelet Count, POC 260  142 -  424 K/uL   MPV 9.0  0 - 99.8 fL  POCT URINALYSIS DIPSTICK      Result Value Range   Color, UA YELLOW     Clarity, UA CLEAR     Glucose, UA NEG     Bilirubin, UA NEG     Ketones, UA NEG     Spec Grav, UA 1.020     Blood, UA TRACE     pH, UA 5.5     Protein, UA NEG     Urobilinogen, UA 0.2     Nitrite, UA NEG     Leukocytes, UA Trace    POCT UA - MICROSCOPIC ONLY      Result Value Range   WBC, Ur, HPF, POC 0-3     RBC, urine, microscopic 2-5     Bacteria, U Microscopic 1+     Mucus, UA NEG     Epithelial cells, urine per micros 3-5     Crystals, Ur, HPF, POC NEG     Casts, Ur, LPF, POC NEG     Yeast, UA NEG    POCT URINE PREGNANCY      Result Value Range   Preg Test, Ur Negative     IgA and HSV I and II (patient requested) pending  ASSESSMENT AND PLAN:  27 y.o. female with improving abdominal pain, nausea, fatigue, history of chest tightness, and anxiety 1) Abdominal pain/nausea -Improving -Will check total IgA level to verify tTG value is not a false negative -Referral to GI for second look -Continue antibiotics -Culture did not reveal a UTI, the abnormalities seen in her UA could have been from her cervicitis  -Asked patient to contact me with results from gyn office -Consider repeat imaging if symptoms return/persist  -I did not check routine STD labs on her at her visit on 12/08/12 secondary to her seeing her gynecologist the same day, patient understands this and agrees with this   2) Fatigue -Stable -Continue current treatment plan  3) Chest tightness -Not active -She is aware that  anxiety may be playing a role -ER/911 precautions  4) Anxiety -Continue current treatment   Signed, Christell Faith, PA-C 12/10/2012 3:49 PM

## 2012-12-11 NOTE — Progress Notes (Signed)
Lab, please see the added on tTG IgG.

## 2012-12-11 NOTE — Addendum Note (Signed)
Addended by: Rise Mu on: 12/11/2012 04:04 PM   Modules accepted: Orders

## 2012-12-13 LAB — TISSUE TRANSGLUTAMINASE, IGG: Tissue Transglut Ab: 12.7 U/mL (ref <20)

## 2012-12-15 ENCOUNTER — Encounter: Payer: Self-pay | Admitting: Gastroenterology

## 2012-12-15 ENCOUNTER — Telehealth: Payer: Self-pay

## 2012-12-15 ENCOUNTER — Ambulatory Visit (INDEPENDENT_AMBULATORY_CARE_PROVIDER_SITE_OTHER): Payer: BC Managed Care – PPO | Admitting: Gastroenterology

## 2012-12-15 VITALS — BP 110/60 | HR 88 | Ht 65.0 in | Wt 133.0 lb

## 2012-12-15 DIAGNOSIS — K900 Celiac disease: Secondary | ICD-10-CM | POA: Insufficient documentation

## 2012-12-15 DIAGNOSIS — R109 Unspecified abdominal pain: Secondary | ICD-10-CM

## 2012-12-15 DIAGNOSIS — R079 Chest pain, unspecified: Secondary | ICD-10-CM

## 2012-12-15 NOTE — Assessment & Plan Note (Addendum)
Although the serologic tests for celiac disease are somewhat equivocall, the biopsy appears to be diagnostic for celiac disease. At the same time, patient's symptoms did not particularly improve on a gluten-free diet.  I suspect the patient does, in fact, have celiac disease although I am not certainthat she is symptomatically from this.  Recommendations #1 patient will continue a regular diet. If workup for her other symptoms is negative I would consider repeat duodenal biopsies and  restarting a gluten-free diet.

## 2012-12-15 NOTE — Progress Notes (Signed)
History of Present Illness: 27 year old white female referred at the request of Dr. Idolina Primer for evaluation of abdominal and chest discomfort and nausea. She underwent workup in May, 2013 for these complaints. Upper endoscopy demonstrated diffuse gastritis and biopsies of the duodenum demonstrated villous blunting consistent with celiac disease. Serum IgA level was 53 Initial TTG value apparently was low. TTG IgG was normal. Workup for abdominal pain included CT of the abdomen and pelvis, ultrasound and a HIDA scan revealing an ejection fraction of 27%. Ovarian cysts were seen and she underwent laparoscopy that demonstrated mild endometriosis and a right ovarian cysts, per patient. She also underwent cardiac evaluation by Dr. Acie Fredrickson who felt that she had noncardiac chest pains and suspected chondro chondritis. Despite taking a strict gluten-free diet symptoms have not particularly changed. She continues to complain of intermittent poorly described chest and upper abdominal pain. She's also complaining of worsening nausea. She takes an oral contraceptive. Bowel movements are solid. She denies abdominal distention or excess gas. Her sister has similar symptoms.     Past Medical History  Diagnosis Date  . Interstitial cystitis   . Chest pain   . Anxiety   . Allergy   . Depression   . Anemia   . Ulcer   . GERD (gastroesophageal reflux disease)    Past Surgical History  Procedure Laterality Date  . Tonsillectomy    . Ovarian cyst removal     family history includes GI problems in her sister and Heart disease in her maternal grandmother.  There is no history of Colon cancer. Current Outpatient Prescriptions  Medication Sig Dispense Refill  . ALPRAZolam (XANAX) 0.5 MG tablet Take 1 tablet (0.5 mg total) by mouth 3 (three) times daily as needed for anxiety.  90 tablet  0  . doxycycline (DORYX) 100 MG DR capsule Take 100 mg by mouth 2 (two) times daily.      Cyndie Chime Estradiol-Fe (GENERESS FE PO)  Take by mouth.       No current facility-administered medications for this visit.   Allergies as of 12/15/2012 - Review Complete 12/15/2012  Allergen Reaction Noted  . Diflucan (fluconazole) Hives 04/09/2012  . Fluconazole in dextrose Hives 12/03/2011    reports that she has quit smoking. She has never used smokeless tobacco. She reports that  drinks alcohol. She reports that she does not use illicit drugs.     Review of Systems: Pertinent positive and negative review of systems were noted in the above HPI section. All other review of systems were otherwise negative.  Vital signs were reviewed in today's medical record Physical Exam: General: Well developed , well nourished, no acute distress Skin: anicteric Head: Normocephalic and atraumatic Eyes:  sclerae anicteric, EOMI Ears: Normal auditory acuity Mouth: No deformity or lesions Neck: Supple, no masses or thyromegaly Lungs: Clear throughout to auscultation Heart: Regular rate and rhythm; no murmurs, rubs or bruits Abdomen: Soft, non tender and non distended. No masses, hepatosplenomegaly or hernias noted. Normal Bowel sounds Rectal:deferred Musculoskeletal: Symmetrical with no gross deformities  Skin: No lesions on visible extremities Pulses:  Normal pulses noted Extremities: No clubbing, cyanosis, edema or deformities noted Neurological: Alert oriented x 4, grossly nonfocal Cervical Nodes:  No significant cervical adenopathy Inguinal Nodes: No significant inguinal adenopathy Psychological:  Alert and cooperative. Normal mood and affect

## 2012-12-15 NOTE — Telephone Encounter (Signed)
Message for [employer], pt states she has feedback/test results from referral and was told to call you with these findings.    Best phone [phone]

## 2012-12-15 NOTE — Patient Instructions (Addendum)
You have been scheduled for a gastric emptying scan at on 12/27/2012 at 10AM. Please arrive at least 15 minutes prior to your appointment for registration. Please make certain not to have anything to eat or drink after midnight the night before your test. Hold all stomach medications (ex: Zofran, phenergan, Reglan) 48 hours prior to your test. If you need to reschedule your appointment, please contact radiology scheduling at [phone]. _____________________________________________________________________ A gastric-emptying study measures how long it takes for food to move through your stomach. There are several ways to measure stomach emptying. In the most common test, you eat food that contains a small amount of radioactive material. A scanner that detects the movement of the radioactive material is placed over your abdomen to monitor the rate at which food leaves your stomach. This test normally takes about 2 hours to complete. _____________________________________________________________________

## 2012-12-15 NOTE — Assessment & Plan Note (Signed)
Etiology for chest and abdominal pain is not certain. Although her gallbladder ejection fraction is borderline low I am not convinced that she is symptomatic from gallbladder disease. Predominant complaints of nausea could be related to gastroparesis or perhaps from her oral contraceptive. Finally, symptoms could be secondary to celiac disease although she did not improve while following a strict gluten-free diet.  Medications #1 gastric emptying scan #2 to consider holding oral contraceptives if #1 is not diagnostic

## 2012-12-17 NOTE — Telephone Encounter (Signed)
Patient states she was tested for G/C at gynecology these were negative. The GI Dr does not think pain is from GB< thinks it is celiac, not GB disease to you [facility]

## 2012-12-17 NOTE — Telephone Encounter (Signed)
Please get info.

## 2012-12-27 ENCOUNTER — Ambulatory Visit (HOSPITAL_COMMUNITY)
Admission: RE | Admit: 2012-12-27 | Discharge: 2012-12-27 | Disposition: A | Discharge status: Home / Self Care | Payer: BC Managed Care – PPO | Source: Ambulatory Visit | Attending: Gastroenterology | Admitting: Gastroenterology

## 2012-12-27 ENCOUNTER — Encounter (HOSPITAL_COMMUNITY): Payer: Self-pay

## 2012-12-27 DIAGNOSIS — R109 Unspecified abdominal pain: Secondary | ICD-10-CM

## 2012-12-27 MED ORDER — TECHNETIUM TC 99M SULFUR COLLOID
2.2000 | Freq: Once | INTRAVENOUS | Status: AC | PRN
  Administered 2012-12-27: 2.2 via ORAL

## 2013-01-11 ENCOUNTER — Ambulatory Visit (INDEPENDENT_AMBULATORY_CARE_PROVIDER_SITE_OTHER): Payer: BC Managed Care – PPO | Admitting: Gastroenterology

## 2013-01-11 ENCOUNTER — Encounter: Payer: Self-pay | Admitting: Gastroenterology

## 2013-01-11 VITALS — BP 100/72 | HR 60 | Ht 65.5 in | Wt 129.4 lb

## 2013-01-11 DIAGNOSIS — R079 Chest pain, unspecified: Secondary | ICD-10-CM

## 2013-01-11 DIAGNOSIS — K900 Celiac disease: Secondary | ICD-10-CM

## 2013-01-11 NOTE — Patient Instructions (Addendum)
You have been scheduled for an endoscopy with propofol. Please follow written instructions given to you at your visit today. If you use inhalers (even only as needed), please bring them with you on the day of your procedure.

## 2013-01-11 NOTE — Assessment & Plan Note (Signed)
Patient has occasional sharp chest pain lasting seconds. Doubt this reflects significant pathology.

## 2013-01-11 NOTE — Progress Notes (Signed)
History of Present Illness:  Mary Wyatt has returned for followup of abdominal pain and bloating. Symptoms continue. She has both spontaneous pain and pain postprandially which are relatively mild.  Gastric emptying scan was normal. Stools are solid.    Review of Systems: Pertinent positive and negative review of systems were noted in the above HPI section. All other review of systems were otherwise negative.    Current Medications, Allergies, Past Medical History, Past Surgical History, Family History and Social History were reviewed in [facility] record  Vital signs were reviewed in today's medical record. Physical Exam: General: Well developed , well nourished, no acute distress On abdominal exam there is mild tenderness to the right of the umbilicus

## 2013-01-11 NOTE — Assessment & Plan Note (Signed)
I suspect patient has celiac disease despite equivocal markers. To the extent that it is contributing to her symptoms remains questionable.  Recommendations #1 upper endoscopy with repeat biopsies

## 2013-01-13 ENCOUNTER — Encounter: Payer: Self-pay | Admitting: Gastroenterology

## 2013-01-13 ENCOUNTER — Ambulatory Visit (AMBULATORY_SURGERY_CENTER): Payer: BC Managed Care – PPO | Admitting: Gastroenterology

## 2013-01-13 VITALS — BP 142/49 | HR 76 | Temp 98.6°F | Resp 13 | Ht 65.0 in | Wt 129.0 lb

## 2013-01-13 DIAGNOSIS — D371 Neoplasm of uncertain behavior of stomach: Secondary | ICD-10-CM

## 2013-01-13 DIAGNOSIS — K900 Celiac disease: Secondary | ICD-10-CM

## 2013-01-13 DIAGNOSIS — R079 Chest pain, unspecified: Secondary | ICD-10-CM

## 2013-01-13 DIAGNOSIS — D378 Neoplasm of uncertain behavior of other specified digestive organs: Secondary | ICD-10-CM

## 2013-01-13 DIAGNOSIS — D375 Neoplasm of uncertain behavior of rectum: Secondary | ICD-10-CM

## 2013-01-13 MED ORDER — SODIUM CHLORIDE 0.9 % IV SOLN: 500.0000 mL | INTRAVENOUS | Status: DC

## 2013-01-13 MED ORDER — PANTOPRAZOLE SODIUM 40 MG PO TBEC: 40.0000 mg | DELAYED_RELEASE_TABLET | Freq: Every day | ORAL | Status: DC

## 2013-01-13 NOTE — Patient Instructions (Signed)
YOU HAD AN ENDOSCOPIC PROCEDURE TODAY AT [facility]: Refer to the procedure report that was given to you for any specific questions about what was found during the examination.  If the procedure report does not answer your questions, please call your gastroenterologist to clarify.  If you requested that your care partner not be given the details of your procedure findings, then the procedure report has been included in a sealed envelope for you to review at your convenience later.  YOU SHOULD EXPECT: Some feelings of bloating in the abdomen. Passage of more gas than usual.  Walking can help get rid of the air that was put into your GI tract during the procedure and reduce the bloating. If you had a lower endoscopy (such as a colonoscopy or flexible sigmoidoscopy) you may notice spotting of blood in your stool or on the toilet paper. If you underwent a bowel prep for your procedure, then you may not have a normal bowel movement for a few days.  DIET: Your first meal following the procedure should be a light meal and then it is ok to progress to your normal diet.  A half-sandwich or bowl of soup is an example of a good first meal.  Heavy or fried foods are harder to digest and may make you feel nauseous or bloated.  Likewise meals heavy in dairy and vegetables can cause extra gas to form and this can also increase the bloating.  Drink plenty of fluids but you should avoid alcoholic beverages for 24 hours.  ACTIVITY: Your care partner should take you home directly after the procedure.  You should plan to take it easy, moving slowly for the rest of the day.  You can resume normal activity the day after the procedure however you should NOT DRIVE or use heavy machinery for 24 hours (because of the sedation medicines used during the test).    SYMPTOMS TO REPORT IMMEDIATELY: A gastroenterologist can be reached at any hour.  During normal business hours, 8:30 AM to 5:00 PM Monday through Friday,  call [phone].  After hours and on weekends, please call the GI answering service at [phone] who will take a message and have the physician on call contact you.  Following upper endoscopy (EGD)  Vomiting of blood or coffee ground material  New chest pain or pain under the shoulder blades  Painful or persistently difficult swallowing  New shortness of breath  Fever of 100F or higher  Black, tarry-looking stools  FOLLOW UP: If any biopsies were taken you will be contacted by phone or by letter within the next 1-3 weeks.  Call your gastroenterologist if you have not heard about the biopsies in 3 weeks.  Our staff will call the home number listed on your records the next business day following your procedure to check on you and address any questions or concerns that you may have at that time regarding the information given to you following your procedure. This is a courtesy call and so if there is no answer at the home number and we have not heard from you through the emergency physician on call, we will assume that you have returned to your regular daily activities without incident.  SIGNATURES/CONFIDENTIALITY: You and/or your care partner have signed paperwork which will be entered into your electronic medical record.  These signatures attest to the fact that that the information above on your After Visit Summary has been reviewed and is understood.  Full responsibility of  the confidentiality of this discharge information lies with you and/or your care-partner.

## 2013-01-13 NOTE — Progress Notes (Signed)
Patient did not experience any of the following events: a burn prior to discharge; a fall within the facility; wrong site/side/patient/procedure/implant event; or a hospital transfer or hospital admission upon discharge from the facility. (G8907) Patient did not have preoperative order for IV antibiotic SSI prophylaxis. (G8918)  

## 2013-01-13 NOTE — Progress Notes (Signed)
Lidocaine-40mg IV prior to Propofol InductionPropofol given over incremental dosages 

## 2013-01-13 NOTE — Op Note (Signed)
[facility]  [address] [place], [address]   ENDOSCOPY PROCEDURE REPORT  PATIENT: Mary Wyatt, Mary Wyatt  MR#: [id number] BIRTHDATE: 1986/06/13 , 27  yrs. old GENDER: Female ENDOSCOPIST: Inda Castle, MD REFERRED BY: PROCEDURE DATE:  01/13/2013 PROCEDURE:  EGD w/ biopsy ASA CLASS:     Class I INDICATIONS:  rule out celiac sprue MEDICATIONS: MAC sedation, administered by CRNA, propofol (Diprivan) 165m IV, and Simethicone 0.6cc PO TOPICAL ANESTHETIC:  DESCRIPTION OF PROCEDURE: After the risks benefits and alternatives of the procedure were thoroughly explained, informed consent was obtained.  The LB GIF-H180 [id number] was introduced through the mouth and advanced to the third portion of the duodenum. Without limitations.  The instrument was slowly withdrawn as the mucosa was fully examined.      There is mild erythema in the lower one third of the esophagus.  No erosions are present.`.   There is mild erythema in the lower one third of the esophagus.  No erosions are present.`.   The remainder of the upper endoscopy exam was otherwise normal. retroflexed view the gastric cardia did not demonstrate any abnormalities. Multiple biopsies were taken in the second portion of the duodenum and in the duodenal bulb.          The scope was then withdrawn from the patient and the procedure completed.  COMPLICATIONS: There were no complications.  ENDOSCOPIC IMPRESSION: nonerosive esophagitis  RECOMMENDATIONS: 1.  begin protonix 40 mg daily 2.  await biopsy results 3.  office visit 3-4 weeks REPEAT EXAM:  eSigned:  RInda Castle MD 01/13/2013 3:00 PM   CC: RChristell Faith MD

## 2013-01-13 NOTE — Progress Notes (Signed)
Called to room to assist during endoscopic procedure.  Patient ID and intended procedure confirmed with present staff. Received instructions for my participation in the procedure from the performing physician.Called to room to assist during endoscopic procedure.  Patient ID and intended procedure confirmed with present staff. Received instructions for my participation in the procedure from the performing physician.

## 2013-01-14 ENCOUNTER — Telehealth: Payer: Self-pay

## 2013-01-14 NOTE — Telephone Encounter (Signed)
  Follow up Call-  Call back number 01/13/2013  Post procedure Call Back phone  # [phone]  Permission to leave phone message Yes     Patient questions:  Do you have a fever, pain , or abdominal swelling? no Pain Score  0 *  Have you tolerated food without any problems? yes  Have you been able to return to your normal activities? yes  Do you have any questions about your discharge instructions: Diet   no Medications  no Follow up visit  no  Do you have questions or concerns about your Care? no   Actions: * If pain score is 4 or above: No action needed, pain <4.

## 2013-01-19 ENCOUNTER — Encounter: Payer: Self-pay | Admitting: Gastroenterology

## 2013-02-02 ENCOUNTER — Encounter: Payer: Self-pay | Admitting: Gastroenterology

## 2013-02-02 ENCOUNTER — Ambulatory Visit (INDEPENDENT_AMBULATORY_CARE_PROVIDER_SITE_OTHER): Payer: BC Managed Care – PPO | Admitting: Gastroenterology

## 2013-02-02 VITALS — BP 110/68 | HR 84 | Ht 65.35 in | Wt 126.2 lb

## 2013-02-02 DIAGNOSIS — K900 Celiac disease: Secondary | ICD-10-CM

## 2013-02-02 MED ORDER — HYOSCYAMINE SULFATE 0.125 MG SL SUBL: 0.2500 mg | SUBLINGUAL_TABLET | SUBLINGUAL | Status: DC | PRN

## 2013-02-02 NOTE — Patient Instructions (Addendum)
Use Lactaid OTC We are sending in your prescription to your pharmacy Keep a dietary history of your abdominal pain and bloating

## 2013-02-02 NOTE — Assessment & Plan Note (Addendum)
Patient does not have definitive evidence for celiac disease by repeat biopsies. Increased lymphoocytes could be a marker for early celiac disease but I would expect villous blunting in the face of frank disease and a normal diet.  Abdominal discomfort and bloating are nonspecific symptoms of dyspepsia. This could be diet-related. Question of lactose intolerance has been raised.  Her conditions #1 hyomax as needed #2 trial of Lactaid supplementation #3 patient was instructed to take a careful dietary history if she has particularly symptomatic episodes of dyspepsia

## 2013-02-02 NOTE — Progress Notes (Signed)
History of Present Illness:  Mary Wyatt has returned following upper endoscopy. Biopsies demonstrated in tact villous architecture with increased lymphocytes.  Patient has been on a regular diet since September. She reports occasional postprandial bloating and discomfort. He thinks since his may worsen with fatty foods, fried foods and milk products.    Review of Systems: Pertinent positive and negative review of systems were noted in the above HPI section. All other review of systems were otherwise negative.    Current Medications, Allergies, Past Medical History, Past Surgical History, Family History and Social History were reviewed in [facility] record  Vital signs were reviewed in today's medical record. Physical Exam: General: Well developed , well nourished, no acute distress

## 2013-02-09 ENCOUNTER — Ambulatory Visit (INDEPENDENT_AMBULATORY_CARE_PROVIDER_SITE_OTHER): Payer: BC Managed Care – PPO | Admitting: Physician Assistant

## 2013-02-09 VITALS — BP 108/68 | HR 64 | Temp 99.1°F | Resp 18 | Ht 65.0 in | Wt 126.0 lb

## 2013-02-09 DIAGNOSIS — R1013 Epigastric pain: Secondary | ICD-10-CM

## 2013-02-09 DIAGNOSIS — J309 Allergic rhinitis, unspecified: Secondary | ICD-10-CM

## 2013-02-09 MED ORDER — PREDNISONE 20 MG PO TABS: ORAL_TABLET | ORAL | Status: DC

## 2013-02-09 MED ORDER — OLOPATADINE HCL 0.2 % OP SOLN: 1.0000 [drp] | Freq: Every day | OPHTHALMIC | Status: DC

## 2013-02-09 NOTE — Progress Notes (Signed)
Patient ID: Mary Wyatt MRN: [id number], DOB: [birth year], 27 y.o. Date of Encounter: 02/09/2013, 9:52 AM  Primary Physician: Marcille Blanco  Chief Complaint: Allergic rhinitis  HPI: 27 y.o. female with history below presents with flare up of allergic rhinitis. This is a longstanding issue for her unfortunately. States this time of year she always has a flare up. Complains of itchy eyes and swollen tissue beneath her eyes. She has been trying daily Claritin, Benadryl, and OTC allergy eye drops. These have not been giving her relief this year. The Benadryl has been making her sleepy at work causing her to take naps at work. She denies much nasal congestion, rhinorrhea, sneezing, or cough. Afebrile.   Secondly, she had a follow up visit with GI last week and was given her biopsy results. She has questionable lactose intolerance and is undergoing a treatment trial for this, but has not yet started this.    Past Medical History  Diagnosis Date  . Interstitial cystitis   . Chest pain   . Anxiety   . Allergy   . Depression   . Anemia   . Ulcer   . GERD (gastroesophageal reflux disease)      Home Meds: Prior to Admission medications   Medication Sig Start Date End Date Taking? Authorizing Provider  ALPRAZolam Duanne Moron) 0.5 MG tablet Take 1 tablet (0.5 mg total) by mouth 3 (three) times daily as needed for anxiety. 12/08/12  Yes Kaiel Weide M Aprille Sawhney, PA-C  hyoscyamine (LEVSIN SL) 0.125 MG SL tablet Place 2 tablets (0.25 mg total) under the tongue every 4 (four) hours as needed for cramping. 02/02/13  Yes Inda Castle, MD  Norethin-Eth Estradiol-Fe (GENERESS FE PO) Take by mouth.   Yes Historical Provider, MD  pantoprazole (PROTONIX) 40 MG tablet Take 1 tablet (40 mg total) by mouth daily. 01/13/13 01/13/14 Yes Inda Castle, MD                  Allergies:  Allergies  Allergen Reactions  . Diflucan (Fluconazole) Hives  . Fluconazole In Dextrose Hives    History   Social History  .  Marital Status: Single    Spouse Name: N/A    Number of Children: 0  . Years of Education: N/A   Occupational History  . [employer]     Social History Main Topics  . Smoking status: Former Research scientist (life sciences)  . Smokeless tobacco: Never Used  . Alcohol Use: Yes     Comment: Occas.  . Drug Use: No  . Sexually Active: Yes    Birth Control/ Protection: Pill   Other Topics Concern  . Not on file   Social History Narrative   Daily caffeine      Review of Systems: Constitutional: negative for chills, fever, night sweats, weight changes, or fatigue  HEENT: see above Cardiovascular: negative for chest pain or palpitations Respiratory: negative for hemoptysis, wheezing, shortness of breath, or cough   Physical Exam: Blood pressure 108/68, pulse 64, temperature 99.1 F (37.3 C), temperature source Oral, resp. rate 18, height 5' 5"  (1.651 m), weight 126 lb (57.153 kg), last menstrual period 12/12/2012, SpO2 98.00%., Body mass index is 20.97 kg/(m^2). General: Well developed, well nourished, in no acute distress. Head: Normocephalic, atraumatic, eyes without discharge, sclera non-icteric, nares are pale and boggy. Bilateral auditory canals clear, TM's are without perforation, pearly grey and translucent with reflective cone of light bilaterally. Oral cavity moist, posterior pharynx with post nasal drip. No exudate, erythema, or peritonsillar  abscess. Uvula midline.   Neck: Supple. No thyromegaly. Full ROM. No lymphadenopathy. Lungs: Clear bilaterally to auscultation without wheezes, rales, or rhonchi. Breathing is unlabored. Heart: RRR with S1 S2. No murmurs, rubs, or gallops appreciated. Msk:  Strength and tone normal for age. Extremities/Skin: Warm and dry. No clubbing or cyanosis. No edema. No rashes or suspicious lesions. Neuro: Alert and oriented X 3. Moves all extremities spontaneously. Gait is normal. CNII-XII grossly in tact. Psych:  Responds to questions appropriately with a normal affect.      ASSESSMENT AND PLAN:  27 y.o. female with allergies and dyspepsia  1) Allergies -Prednisone 20 mg #18 3x3, 2x3, 1x3 no RF -Pataday 1 gtt daily #1 RF 5 -Allegra daily -1 tsp of local honey daily -Good clean air filters in home  2) Dyspepsia  -Food diary  -Can try single elimination diet over time and keeping track of symptoms -Follow up with GI as directed    Signed, Christell Faith, PA-C 02/09/2013 9:52 AM

## 2013-02-14 ENCOUNTER — Ambulatory Visit: Payer: BC Managed Care – PPO | Admitting: Gastroenterology

## 2013-03-04 ENCOUNTER — Ambulatory Visit (INDEPENDENT_AMBULATORY_CARE_PROVIDER_SITE_OTHER): Payer: BC Managed Care – PPO | Admitting: Physician Assistant

## 2013-03-04 VITALS — BP 112/72 | HR 86 | Temp 98.5°F | Resp 16 | Ht 65.5 in | Wt 127.0 lb

## 2013-03-04 DIAGNOSIS — F411 Generalized anxiety disorder: Secondary | ICD-10-CM

## 2013-03-04 DIAGNOSIS — R202 Paresthesia of skin: Secondary | ICD-10-CM

## 2013-03-04 DIAGNOSIS — F419 Anxiety disorder, unspecified: Secondary | ICD-10-CM

## 2013-03-04 DIAGNOSIS — R209 Unspecified disturbances of skin sensation: Secondary | ICD-10-CM

## 2013-03-04 LAB — POCT CBC
Granulocyte percent: 56.8 %G (ref 37–80)
HCT, POC: 41.1 % (ref 37.7–47.9)
MCH, POC: 31.3 pg — AB (ref 27–31.2)
MCV: 98.7 fL — AB (ref 80–97)
MID (cbc): 0.2 (ref 0–0.9)
POC LYMPH PERCENT: 36.8 %L (ref 10–50)
RBC: 4.16 M/uL (ref 4.04–5.48)
RDW, POC: 12.7 %
WBC: 3.5 10*3/uL — AB (ref 4.6–10.2)

## 2013-03-04 LAB — COMPREHENSIVE METABOLIC PANEL
ALT: 14 U/L (ref 0–35)
AST: 17 U/L (ref 0–37)
Alkaline Phosphatase: 41 U/L (ref 39–117)
Calcium: 10 mg/dL (ref 8.4–10.5)
Chloride: 105 mEq/L (ref 96–112)
Creat: 0.7 mg/dL (ref 0.50–1.10)
Potassium: 4.3 mEq/L (ref 3.5–5.3)

## 2013-03-04 MED ORDER — ALPRAZOLAM 0.5 MG PO TABS: 0.5000 mg | ORAL_TABLET | Freq: Three times a day (TID) | ORAL | Status: DC | PRN

## 2013-03-04 NOTE — Progress Notes (Signed)
Patient ID: Mary Wyatt MRN: [id number], DOB: August 26, 1986, 27 y.o. Date of Encounter: 03/04/2013, 4:14 PM  Primary Physician: Marcille Blanco  Chief Complaint: Paresthesias hands and feet  HPI: 27 y.o. female with history below presents with possible waxing and waning history of numbness and tingling in her feet and hands. Symptoms may sometimes include her right calve muscle, but she is not sure about this. She is not sure how often these symptoms occur or how long they last. 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 She may have some numbness and tingling in her hands. She is not sure how frequently this occurs or how long it lasts. No associated weakness into her arms or legs.   She states that she does not want to move out to the [place], [place]. States there is poor management. Poor housing conditions. However, she does not want to burn the bridge and she will head out there and work for the required 6 months she signed up for. This has caused her a considerable amount of stress and notes this could be playing a role in the above.      Past Medical History  Diagnosis Date  . Interstitial cystitis   . Chest pain   . Anxiety   . Allergy   . Depression   . Anemia   . Ulcer   . GERD (gastroesophageal reflux disease)      Home Meds: Prior to Admission medications   Medication Sig Start Date End Date Taking? Authorizing Provider  ALPRAZolam Duanne Moron) 0.5 MG tablet Take 1 tablet (0.5 mg total) by mouth 3 (three) times daily as needed for anxiety. 03/04/13  Yes Cindee Mclester M Naevia Unterreiner, PA-C  Norethin-Eth Estradiol-Fe (GENERESS FE PO) Take by mouth.   Yes Historical Provider, MD  pantoprazole (PROTONIX) 40 MG tablet Take 1 tablet (40 mg total) by mouth daily. 01/13/13  01/13/14 Yes Inda Castle, MD  hyoscyamine (LEVSIN SL) 0.125 MG SL tablet Place 2 tablets (0.25 mg total) under the tongue every 4 (four) hours as needed for cramping. 02/02/13   Inda Castle, MD  Olopatadine HCl 0.2 % SOLN Apply 1 drop to eye daily. 02/09/13   Areta Haber Kaysa Roulhac, PA-C  predniSONE (DELTASONE) 20 MG tablet 3 PO FOR 3 DAYS, 2 PO FOR 3 DAYS, 1 PO FOR 3 DAYS 02/09/13   Rise Mu, PA-C    Allergies:  Allergies  Allergen Reactions  . Diflucan (Fluconazole) Hives  . Fluconazole In Dextrose Hives    History   Social History  . Marital Status: Single    Spouse Name: N/A    Number of Children: 0  . Years of Education: N/A   Occupational History  . [employer]     Social History Main Topics  . Smoking status: Former Research scientist (life sciences)  . Smokeless tobacco: Never Used  . Alcohol Use: Yes     Comment: Occas.  . Drug Use: No  . Sexually Active: Yes    Birth Control/ Protection: Pill   Other Topics Concern  . Not on file   Social History Narrative   Daily caffeine      Review of Systems: Constitutional: negative for chills, fever, night sweats, weight changes, or fatigue  HEENT: negative for vision changes, or hearing loss Cardiovascular: negative for chest pain or palpitations Respiratory: negative for hemoptysis, wheezing, shortness of breath, or cough Dermatological: negative for rash Musculoskeletal: negative for weakness  Neurologic: positive for paresthesias. negative for headache, dizziness, or syncope   Physical Exam: Blood pressure 112/72, pulse 86, temperature 98.5 F (36.9 C), temperature source Oral, resp. rate 16, height 5' 5.5" (1.664 m), weight 127 lb (57.607 kg), last menstrual period 12/12/2012, SpO2 96.00%., Body mass index is 20.81 kg/(m^2). General: Well developed, well nourished, in no acute distress. Head: Normocephalic, atraumatic, eyes without discharge, sclera non-icteric, nares are without discharge. Bilateral auditory canals clear, TM's are without  perforation, pearly grey and translucent with reflective cone of light bilaterally. Oral cavity moist, posterior pharynx without exudate, erythema, peritonsillar abscess, or post nasal drip.  Neck: Supple. No thyromegaly. Full ROM. No lymphadenopathy. Lungs: Clear bilaterally to auscultation without wheezes, rales, or rhonchi. Breathing is unlabored. Heart: RRR with S1 S2. No murmurs, rubs, or gallops appreciated. Msk:  Strength and tone normal for age. Extremities/Skin: Warm and dry. No clubbing or cyanosis. No edema. No rashes or suspicious lesions. Neuro: Alert and oriented X 3. Moves all extremities spontaneously. Gait is normal. CNII-XII grossly in tact. DTR 2+ throughout. Sharp-dull intact throughout. Light touch intact throughout. Proprioception intact bilaterally.   Psych:  Responds to questions appropriately with a normal affect.   Labs: Results for orders placed in visit on 03/04/13  POCT CBC      Result Value Range   WBC 3.5 (*) 4.6 - 10.2 K/uL   Lymph, poc 1.3  0.6 - 3.4   POC LYMPH PERCENT 36.8  10 - 50 %L   MID (cbc) 0.2  0 - 0.9   POC MID % 6.4  0 - 12 %M   POC Granulocyte 2.0  2 - 6.9   Granulocyte percent 56.8  37 - 80 %G   RBC 4.16  4.04 - 5.48 M/uL   Hemoglobin 13.0  12.2 - 16.2 g/dL   HCT, POC 41.1  37.7 - 47.9 %   MCV 98.7 (*) 80 - 97 fL   MCH, POC 31.3 (*) 27 - 31.2 pg   MCHC 31.6 (*) 31.8 - 35.4 g/dL   RDW, POC 12.7     Platelet Count, POC 203  142 - 424 K/uL   MPV 10.5  0 - 99.8 fL   B12, MMA, TSH, and CMP all pending  ASSESSMENT AND PLAN:  27 y.o. female with paresthesias and anxiety likely secondary to tension and stress associated with upcoming move to [employer], [place] for employer -Non-focal history and exam -Await labs -Refilled Xanax 0.5 mg 1 po tid prn anxiety #90 no RF, SED -Advised patient to go get a massage today or tomorrow and continue with this monthly as needed -RTC precautions    Signed, Christell Faith, PA-C 03/04/2013 4:14 PM

## 2013-07-21 ENCOUNTER — Ambulatory Visit (INDEPENDENT_AMBULATORY_CARE_PROVIDER_SITE_OTHER): Payer: BC Managed Care – PPO | Admitting: Family Medicine

## 2013-07-21 VITALS — BP 104/76 | HR 88 | Temp 98.8°F | Resp 20 | Ht 65.5 in | Wt 134.4 lb

## 2013-07-21 DIAGNOSIS — R05 Cough: Secondary | ICD-10-CM

## 2013-07-21 DIAGNOSIS — Z23 Encounter for immunization: Secondary | ICD-10-CM

## 2013-07-21 DIAGNOSIS — Z2089 Contact with and (suspected) exposure to other communicable diseases: Secondary | ICD-10-CM

## 2013-07-21 DIAGNOSIS — J309 Allergic rhinitis, unspecified: Secondary | ICD-10-CM

## 2013-07-21 DIAGNOSIS — J329 Chronic sinusitis, unspecified: Secondary | ICD-10-CM

## 2013-07-21 DIAGNOSIS — R059 Cough, unspecified: Secondary | ICD-10-CM

## 2013-07-21 DIAGNOSIS — Z202 Contact with and (suspected) exposure to infections with a predominantly sexual mode of transmission: Secondary | ICD-10-CM

## 2013-07-21 MED ORDER — AMOXICILLIN 875 MG PO TABS: 875.0000 mg | ORAL_TABLET | Freq: Two times a day (BID) | ORAL | Status: DC

## 2013-07-21 MED ORDER — FLUTICASONE PROPIONATE 50 MCG/ACT NA SUSP: 2.0000 | Freq: Every day | NASAL | Status: DC

## 2013-07-21 MED ORDER — BENZONATATE 100 MG PO CAPS: 100.0000 mg | ORAL_CAPSULE | Freq: Three times a day (TID) | ORAL | Status: DC | PRN

## 2013-07-21 NOTE — Patient Instructions (Addendum)
Drink plenty of fluids to keep well hydrated. That will help keep your secretions in her.  Take an over-the-counter antihistamine fairly regularly  Flonase (fluticasone) spray 2 sprays each nostril twice daily for 3 days, then once daily  Amoxicillin 875 twice daily for infection  Tessalon (benzatonate) one or 2 every 8 hours as needed for cough and tickle in the throat  Return if symptoms persist

## 2013-07-21 NOTE — Progress Notes (Signed)
Subjective: 27 year old lady who has a history of having had a lot of trouble for some time with head congestion. The last few weeks has been worse with head congestion, not being able to smell or taste, postnasal drainage and tickle in her throat, and some cough. She does not smoke. [place] a good deal of mold exposure. She also comes down to [facility] which is her home base. She did take OTC antihistamines, currently Allegra and Sudafed. She has not had allergy testing was done. She has not felt real well recent weight.  Patient has history of being tested for ST please and getting an equivocal report on her HSV, so requested a repeat testing.  Also wants a flu shot  Objective: No acute distress. TMs normal. Left nostril is especially swollen shut, right somewhat. Throat appears clear. Neck supple without significant nodes. Chest is clear to auscultation. Heart regular without murmurs.  Assessment: Allergic rhinitis, probably mold aggravated Chronic sinusitis Influenza vaccine STD exposure risk  Plan: Recheck her HSV titers Flu shot Tessalon, antihistamines, fluticasone  Return if problems

## 2013-07-22 LAB — HSV(HERPES SIMPLEX VRS) I + II AB-IGG: HSV 2 Glycoprotein G Ab, IgG: <0.1 IV

## 2013-08-23 ENCOUNTER — Other Ambulatory Visit: Payer: Self-pay | Admitting: Physician Assistant

## 2013-08-26 ENCOUNTER — Other Ambulatory Visit: Payer: Self-pay | Admitting: Physician Assistant

## 2013-10-10 ENCOUNTER — Other Ambulatory Visit: Payer: Self-pay | Admitting: Physician Assistant

## 2013-10-10 DIAGNOSIS — F411 Generalized anxiety disorder: Secondary | ICD-10-CM

## 2013-10-20 DIAGNOSIS — A770 Spotted fever due to Rickettsia rickettsii: Secondary | ICD-10-CM

## 2013-10-20 HISTORY — DX: Spotted fever due to Rickettsia rickettsii: A77.0

## 2014-02-09 ENCOUNTER — Ambulatory Visit (INDEPENDENT_AMBULATORY_CARE_PROVIDER_SITE_OTHER): Payer: Federal, State, Local not specified - PPO | Admitting: Cardiovascular Disease

## 2014-02-09 ENCOUNTER — Encounter: Payer: Self-pay | Admitting: Cardiovascular Disease

## 2014-02-09 ENCOUNTER — Encounter (INDEPENDENT_AMBULATORY_CARE_PROVIDER_SITE_OTHER): Payer: Self-pay

## 2014-02-09 VITALS — BP 112/71 | HR 75 | Ht 65.5 in | Wt 138.1 lb

## 2014-02-09 DIAGNOSIS — I253 Aneurysm of heart: Secondary | ICD-10-CM

## 2014-02-09 DIAGNOSIS — R079 Chest pain, unspecified: Secondary | ICD-10-CM

## 2014-02-09 NOTE — Assessment & Plan Note (Addendum)
Mary Wyatt is seen  today for followup of her chest discomfort. She continues to have vague episodes of chest discomfort these episodes are truly not anginal. She was incidentally found to have an atrial septal aneurysm at her echocardiogram. We had long discussion regarding the benign nature of the atrial septal aneurysm. She's not had any episodes of TIA symptoms. She's not at high risk of developing DVT. She does not smoke.  I've encouraged her to exercise on regular basis. We'll not need to see her on a regular basis but I will be happy to see her as needed. I've advised her to check back in with [place] in about 5 years.

## 2014-02-09 NOTE — Patient Instructions (Addendum)
Atrial septal aneurism.  - benign. Normal LV function.  Your physician recommends that you schedule a follow-up appointment as needed.

## 2014-02-09 NOTE — Progress Notes (Signed)
Mary Wyatt Date of Birth  1986/03/18       [facility]    [employer] [address] [place], [place], [place]  [address]   [place], [place]  [address] [phone]     [phone]   Fax  [phone]    Fax [phone]  Problem List: 1. Chest pain  History of Present Illness:   she's been treating this with antacids and other GI-related medications but has not noticed much relief.  Mary Wyatt is a 28 yo with hx of chest pain / pressure.   She's not noticed any difference with exertion. The pain is there almost all the time. There could be a pleuritic component to the chest pain. The pain is not related to eating  or drinking.  There is no worsening with any activity.  She works at a desk job. She occasionally will rides stationary bike.  She is moving to the [employer] in a month.   February 09, 2014:  [name] has been doing well.  She has not had any angina CP.    She continues to have some left sided chest pain.  The pains last for several hours at a time then may recur the next day.  She is able to exercise even when she is having these episodes of discomfort in the pains do not worsen.  Current Outpatient Prescriptions on File Prior to Visit  Medication Sig Dispense Refill  . ALPRAZolam (XANAX) 0.5 MG tablet TAKE ONE TABLET BY MOUTH THREE TIMES DAILY AS NEEDED   90 tablet  0  . fluticasone (FLONASE) 50 MCG/ACT nasal spray Place 2 sprays into the nose daily.  16 g  3  . Norethin-Eth Estradiol-Fe (GENERESS FE PO) Take by mouth.       No current facility-administered medications on file prior to visit.    Allergies  Allergen Reactions  . Diflucan [Fluconazole] Hives  . Fluconazole In Dextrose Hives    Past Medical History  Diagnosis Date  . Interstitial cystitis   . Chest pain   . Anxiety   . Allergy   . Depression   . Anemia   . Ulcer   . GERD (gastroesophageal reflux disease)     Past Surgical History  Procedure  Laterality Date  . Tonsillectomy    . Ovarian cyst removal      History  Smoking status  . Former Smoker  Smokeless tobacco  . Never Used    History  Alcohol Use  . 0.0 oz/week  . 0-3 drink(s) per week    Comment: Occas.    Family History  Problem Relation Age of Onset  . GI problems Sister   . Asthma Sister   . Heart disease Maternal Grandmother   . Colon cancer Neg Hx     Reviw of Systems:  Reviewed in the HPI.  All other systems are negative.  Physical Exam: Blood pressure 112/71, pulse 75, height 5' 5.5" (1.664 m), weight 138 lb 1.9 oz (62.651 kg). General: Well developed, well nourished, in no acute distress.  Head: Normocephalic, atraumatic, sclera non-icteric, mucus membranes are moist,   Neck: Supple. Carotids are 2 + without bruits. No JVD  Lungs: Clear bilaterally to auscultation.  She is tender along her sternum.  Heart: regular rate.  normal  S1 S2. No murmurs, gallops or rubs.  Abdomen: Soft, non-tender, non-distended with normal bowel sounds. No hepatomegaly. No rebound/guarding. No masses.  Msk:  Strength  and tone are normal  Extremities: No clubbing or cyanosis. No edema.  Distal pedal pulses are 2+ and equal bilaterally.  Neuro: Alert and oriented X 3. Moves all extremities spontaneously.  Psych:  Responds to questions appropriately with a normal affect.  ECG February 09, 2014:  NSR at 75.  Sinus arrhythmia, normal ECG  Assessment / Plan:

## 2014-04-02 ENCOUNTER — Other Ambulatory Visit: Payer: Self-pay | Admitting: Physician Assistant

## 2014-04-20 ENCOUNTER — Ambulatory Visit (INDEPENDENT_AMBULATORY_CARE_PROVIDER_SITE_OTHER): Payer: BC Managed Care – PPO | Admitting: Family Medicine

## 2014-04-20 VITALS — BP 100/76 | HR 76 | Temp 98.1°F | Resp 16 | Ht 65.5 in | Wt 139.8 lb

## 2014-04-20 DIAGNOSIS — B373 Candidiasis of vulva and vagina: Secondary | ICD-10-CM

## 2014-04-20 DIAGNOSIS — F411 Generalized anxiety disorder: Secondary | ICD-10-CM | POA: Insufficient documentation

## 2014-04-20 DIAGNOSIS — J309 Allergic rhinitis, unspecified: Secondary | ICD-10-CM | POA: Insufficient documentation

## 2014-04-20 DIAGNOSIS — N949 Unspecified condition associated with female genital organs and menstrual cycle: Secondary | ICD-10-CM

## 2014-04-20 DIAGNOSIS — B3731 Acute candidiasis of vulva and vagina: Secondary | ICD-10-CM

## 2014-04-20 DIAGNOSIS — N9489 Other specified conditions associated with female genital organs and menstrual cycle: Secondary | ICD-10-CM

## 2014-04-20 DIAGNOSIS — N898 Other specified noninflammatory disorders of vagina: Secondary | ICD-10-CM

## 2014-04-20 DIAGNOSIS — R350 Frequency of micturition: Secondary | ICD-10-CM

## 2014-04-20 LAB — POCT UA - MICROSCOPIC ONLY
CRYSTALS, UR, HPF, POC: NEGATIVE
Casts, Ur, LPF, POC: NEGATIVE
Mucus, UA: NEGATIVE
RBC, urine, microscopic: NEGATIVE
Yeast, UA: NEGATIVE

## 2014-04-20 LAB — POCT URINALYSIS DIPSTICK
Bilirubin, UA: NEGATIVE
Glucose, UA: NEGATIVE
KETONES UA: NEGATIVE
Leukocytes, UA: NEGATIVE
Nitrite, UA: NEGATIVE
PH UA: 5.5
PROTEIN UA: NEGATIVE
Spec Grav, UA: 1.015
Urobilinogen, UA: 0.2

## 2014-04-20 LAB — POCT WET PREP WITH KOH
KOH Prep POC: POSITIVE
RBC WET PREP PER HPF POC: NEGATIVE
Trichomonas, UA: NEGATIVE
YEAST WET PREP PER HPF POC: NEGATIVE

## 2014-04-20 MED ORDER — TERCONAZOLE 0.4 % VA CREA: 1.0000 | TOPICAL_CREAM | Freq: Every day | VAGINAL | Status: DC

## 2014-04-20 MED ORDER — MONTELUKAST SODIUM 10 MG PO TABS: 10.0000 mg | ORAL_TABLET | Freq: Every day | ORAL | Status: DC

## 2014-04-20 MED ORDER — ALPRAZOLAM 0.5 MG PO TABS: 0.5000 mg | ORAL_TABLET | Freq: Every day | ORAL | Status: DC | PRN

## 2014-04-20 NOTE — Progress Notes (Signed)
Patient ID: Mary Wyatt, female   DOB: 1986-02-26, 28 y.o.   MRN: [id number]   Subjective:  This chart was scribed for Wardell Honour, MD by Mary Wyatt, ED Scribe. This patient was seen in room Room 5 and the patient's care was started at 2:26 PM.   Patient ID: Mary Wyatt, female    DOB: [birth year], 28 y.o.   MRN: [id number]  04/20/2014  Urinary Frequency and Medication Refill  HPI HPI Comments: Mary Wyatt is a 28 y.o. female who presents to the [facility] complaining of increased urinary frequency and urgency that started last week.  Pt states that there were no modifying factors. She reports an associated vaginal burning sensation. She denies any vaginal pain, itching, bleeding, or discharge.  Denies dysuria, flank pain, fever, chills, n/v.  There was concern for interstitial cystitis in the past; s/p urology consultation two years ago; no formal dx of interstitial cystitis; no cystoscopy performed; would like return to urology if urine culture negative.  She states that she is currently sexually active and has had new partners recently. She also states that she is single and has no kids.She reports that she is currently on birth control.  S/p Gardisil series. Pap smears by Dr. Lowe/gynecology in 01/2014.  +vaginal burning. No vaginal discharge.  She reports that she take Xanax as needed for her job-related anxiety. She states that she usually takes Xanex every other day and that she now needs a refill.  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  Exercising sporadically; no formal therapy because of frequent travel for employment.  Pt states that she is not currently exercising much, just walking occasionally. She reports that she occasionally drinks, about two drinks a week. She states that she does not smoke. She reports no other drug use. She also reports no other chronic health problems. She states that the other medications that she takes are  Singulair and Flonase.   Review of Systems  Constitutional: Negative for fever, chills and diaphoresis.  Gastrointestinal: Negative for nausea, vomiting and abdominal pain.  Genitourinary: Positive for urgency and frequency. Negative for dysuria, hematuria, flank pain, vaginal bleeding, vaginal discharge, genital sores, vaginal pain, menstrual problem, pelvic pain and dyspareunia.  Psychiatric/Behavioral: Negative for sleep disturbance and dysphoric mood. The patient is nervous/anxious.     Past Medical History  Diagnosis Date   Interstitial cystitis    Chest pain    Anxiety    Allergy    Depression    Anemia    Ulcer    GERD (gastroesophageal reflux disease)    Past Surgical History  Procedure Laterality Date   Tonsillectomy     Ovarian cyst removal      Allergies  Allergen Reactions   Diflucan [Fluconazole] Hives   Fluconazole In Dextrose Hives   Current Outpatient Prescriptions  Medication Sig Dispense Refill   ALPRAZolam (XANAX) 0.5 MG tablet Take 1 tablet (0.5 mg total) by mouth daily as needed for anxiety.  90 tablet  0   fluticasone (FLONASE) 50 MCG/ACT nasal spray Place 2 sprays into the nose daily.  16 g  3   Norethin-Eth Estradiol-Fe (GENERESS FE PO) Take by mouth.       montelukast (SINGULAIR) 10 MG tablet Take 1 tablet (10 mg total) by mouth at bedtime.  30 tablet  11   terconazole (TERAZOL 7) 0.4 % vaginal cream Place 1 applicator vaginally at bedtime.  45 g  0  No current facility-administered medications for this visit.   History   Social History   Marital Status: Single    Spouse Name: N/A    Number of Children: 0   Years of Education: N/A   Occupational History   Park Ranger      [employer]   Social History Main Topics   Smoking status: Former Smoker   Smokeless tobacco: Never Used   Alcohol Use: 0.0 oz/week    0-3 drink(s) per week     Comment: Occas.   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 Constitutional: She is oriented to person, place, and time. She appears well-developed and well-nourished. No distress.  HENT:  Head: Normocephalic and atraumatic.  Eyes: Conjunctivae and EOM are normal. Pupils are equal, round, and reactive to light. Right eye exhibits no discharge. Left eye exhibits no discharge.  Neck: Normal range of motion. Neck supple. No tracheal deviation present.  Cardiovascular: Normal rate, regular rhythm, normal heart sounds and intact distal pulses.   Pulmonary/Chest: Effort normal and breath sounds normal. No respiratory distress.  Abdominal: Soft. Bowel sounds are normal. She exhibits no distension. There is tenderness in the right lower quadrant. There is no rebound and no guarding.  RLQ tenderness  Genitourinary: Uterus normal. There is no rash, tenderness or lesion on the right labia. There is no rash, tenderness or lesion on the left labia. Cervix exhibits discharge. Cervix exhibits no motion tenderness and no friability. Right adnexum displays no mass, no tenderness and no fullness. Left adnexum displays no mass, no tenderness and no fullness. Vaginal discharge found.  Musculoskeletal: Normal range of  motion.  Neurological: She is alert and oriented to person, place, and time.  Skin: Skin is warm and dry. She is not diaphoretic.  Psychiatric: She has a normal mood and affect. Her behavior is normal. Judgment and thought content normal.   Results for orders placed in visit on 04/20/14  POCT UA - MICROSCOPIC ONLY      Result Value Ref Range   WBC, Ur, HPF, POC 0-1     RBC, urine, microscopic NEG     Bacteria, U Microscopic 1+     Mucus, UA NEG     Epithelial cells, urine per micros 1-4     Crystals, Ur, HPF, POC NEG     Casts, Ur, LPF,  POC NEG     Yeast, UA NEG    POCT URINALYSIS DIPSTICK      Result Value Ref Range   Color, UA YELLOW     Clarity, UA CLOUDY     Glucose, UA NEG     Bilirubin, UA NEG     Ketones, UA NEG     Spec Grav, UA 1.015     Blood, UA TRACE     pH, UA 5.5     Protein, UA NEG     Urobilinogen, UA 0.2     Nitrite, UA NEG     Leukocytes, UA Negative    POCT WET PREP WITH KOH      Result Value Ref Range   Trichomonas, UA Negative     Clue Cells Wet Prep HPF POC 3-9     Epithelial Wet Prep HPF POC 6-16     Yeast Wet Prep HPF POC NEG     Bacteria Wet Prep HPF POC 3+     RBC Wet Prep HPF POC NEG     WBC Wet Prep HPF POC 3-10     KOH Prep POC Positive         Assessment & Plan:  Urine frequency - Plan: POCT UA - Microscopic Only, POCT urinalysis dipstick, Urine culture  Vaginal burning - Plan: GC/Chlamydia Probe Amp  Generalized anxiety disorder  Allergic rhinitis, unspecified allergic rhinitis type - Plan: montelukast (SINGULAIR) 10 MG tablet  Vaginal discharge - Plan: POCT Wet Prep with KOH, GC/Chlamydia Probe Amp  Anxiety state, unspecified - Plan: ALPRAZolam (XANAX) 0.5 MG tablet  Vaginal candidiasis   1. Urinary frequency: recurrent; question of interstitial cystitis but no formal diagnosis.  Send urine culture; if negative, refer to urology. 2.  Vaginal burning:  New.  Send GC/Chlam.  Treat candidiasis with Terazol cream qhs. 3.   Generalized anxiety disorder:  Stable; refill of Xanax provided; RTC six months; discussed controlled substance policy with pt in detail.  Has not tolerated several SSRIs in past.  Using Xanax once every other day on average. 4. Vaginal candidiasis:  New.  Rx for Terazol qhs. 5.  Allergic Rhinitis:  Controlled; refill of Singulair provided.  Meds ordered this encounter  Medications   DISCONTD: montelukast (SINGULAIR) 10 MG tablet    Sig: Take 10 mg by mouth at bedtime.   ALPRAZolam (XANAX) 0.5 MG tablet    Sig: Take 1 tablet (0.5 mg total) by mouth daily as needed for anxiety.    Dispense:  90 tablet    Refill:  0   montelukast (SINGULAIR) 10 MG tablet    Sig: Take 1 tablet (10 mg total) by mouth at bedtime.    Dispense:  30 tablet    Refill:  11   terconazole (TERAZOL 7) 0.4 % vaginal cream    Sig: Place 1 applicator vaginally at bedtime.    Dispense:  45 g    Refill:  0    Return in about 6 months (around 10/21/2014) for recheck anxiety.  I personally performed the services described in this documentation, which was scribed in my presence.  The recorded information has been reviewed and is accurate.  Mary Wyatt, M.D.  [facility] [address] [place], [place]  [address] [phone] phone [phone] fax

## 2014-04-20 NOTE — Patient Instructions (Signed)
[facility] Policy for Prescribing Controlled Substances (Revised 08/2012) 1. Prescriptions for controlled substances will be filled by ONE provider at [facility] with whom you have established and developed a plan for your care, including follow-up. 2. You are encouraged to schedule an appointment with your prescriber at our appointment center for follow-up visits whenever possible. 3. If you request a prescription for the controlled substance while at [facility] for an acute problem (with someone other than your regular prescriber), you MAY be given a ONE-TIME prescription for a 30-day supply of the controlled substance, to allow time for you to return to see your regular prescriber for additional prescriptions.

## 2014-04-22 LAB — GC/CHLAMYDIA PROBE AMP
CT Probe RNA: NEGATIVE
GC Probe RNA: NEGATIVE

## 2014-04-24 LAB — URINE CULTURE

## 2014-04-27 MED ORDER — AMOXICILLIN 500 MG PO CAPS: 500.0000 mg | ORAL_CAPSULE | Freq: Three times a day (TID) | ORAL | Status: DC

## 2014-04-27 NOTE — Addendum Note (Signed)
Addended by: Wardell Honour on: 04/27/2014 11:06 AM   Modules accepted: Orders

## 2014-04-28 ENCOUNTER — Other Ambulatory Visit: Payer: Self-pay | Admitting: *Deleted

## 2014-04-28 DIAGNOSIS — N3000 Acute cystitis without hematuria: Secondary | ICD-10-CM

## 2014-04-28 MED ORDER — AMOXICILLIN 500 MG PO CAPS: 500.0000 mg | ORAL_CAPSULE | Freq: Three times a day (TID) | ORAL | Status: DC

## 2014-05-31 ENCOUNTER — Ambulatory Visit (INDEPENDENT_AMBULATORY_CARE_PROVIDER_SITE_OTHER): Payer: BC Managed Care – PPO | Admitting: Family Medicine

## 2014-05-31 VITALS — BP 122/74 | HR 90 | Temp 98.0°F | Resp 17 | Ht 65.5 in | Wt 138.0 lb

## 2014-05-31 DIAGNOSIS — N644 Mastodynia: Secondary | ICD-10-CM

## 2014-05-31 DIAGNOSIS — Z23 Encounter for immunization: Secondary | ICD-10-CM

## 2014-05-31 LAB — POCT URINE PREGNANCY: PREG TEST UR: NEGATIVE

## 2014-05-31 NOTE — Patient Instructions (Signed)
We will set up a mammogram for you.  I do not think they will find anything wrong but we will make sure.  Let me know if you have any other changes or worsening symptoms.  Also, take another home pregnancy test in 2 weeks

## 2014-05-31 NOTE — Progress Notes (Signed)
[facility] [address] [place] [address] [phone]  Date:  05/31/2014   Name:  Mary Wyatt   DOB:  Feb 22, 1986   MRN:  [id number]  PCP:  Marcille Blanco    Chief Complaint: Breast Pain   History of Present Illness:  Mary Wyatt is a 28 y.o. very pleasant female patient who presents with the following:  She is here today with right breast pain for about one week. Otherwise she is feeling ok.   LMP was 3 weeks ago- she tends to skip her menses while she is on her OCP.  She has noted a slight abnormal feeling in the left breast as well but less so than the right.  She notes the breast seems more sensitive than usual if anything presses on it, if she leans on something, etc Otherwise she has not noted any abnormality.  She does tend to have some health related anxiety   Patient Active Problem List   Diagnosis Date Noted  . Rhinitis, allergic 04/20/2014  . Generalized anxiety disorder 04/20/2014  . Celiac disease 12/15/2012  . Chest pain 03/04/2012  . Interstitial cystitis     Past Medical History  Diagnosis Date  . Interstitial cystitis   . Chest pain   . Anxiety   . Allergy   . Depression   . Anemia   . Ulcer   . GERD (gastroesophageal reflux disease)   . Genital warts     Past Surgical History  Procedure Laterality Date  . Tonsillectomy    . Ovarian cyst removal      History  Substance Use Topics  . Smoking status: Former Research scientist (life sciences)  . Smokeless tobacco: Never Used  . Alcohol Use: 0.0 oz/week    0-3 drink(s) per week     Comment: Occas.    Family History  Problem Relation Age of Onset  . GI problems Sister   . Asthma Sister   . Heart disease Maternal Grandmother   . Colon cancer Neg Hx     Allergies  Allergen Reactions  . Diflucan [Fluconazole] Hives  . Fluconazole In Dextrose Hives    Medication list has been reviewed and updated.  Current Outpatient Prescriptions on File Prior to Visit  Medication Sig  Dispense Refill  . ALPRAZolam (XANAX) 0.5 MG tablet Take 1 tablet (0.5 mg total) by mouth daily as needed for anxiety.  90 tablet  0  . fluticasone (FLONASE) 50 MCG/ACT nasal spray Place 2 sprays into the nose daily.  16 g  3  . montelukast (SINGULAIR) 10 MG tablet Take 1 tablet (10 mg total) by mouth at bedtime.  30 tablet  11  . amoxicillin (AMOXIL) 500 MG capsule Take 1 capsule (500 mg total) by mouth 3 (three) times daily.  21 capsule  0  . Norethin-Eth Estradiol-Fe (GENERESS FE PO) Take by mouth.      . terconazole (TERAZOL 7) 0.4 % vaginal cream Place 1 applicator vaginally at bedtime.  45 g  0   No current facility-administered medications on file prior to visit.    Review of Systems:  As per HPI- otherwise negative.   Physical Examination: Filed Vitals:   05/31/14 1459  BP: 122/74  Pulse: 90  Temp: 98 F (36.7 C)  Resp: 17   Filed Vitals:   05/31/14 1459  Height: 5' 5.5" (1.664 m)  Weight: 138 lb (62.596 kg)   Body mass index is 22.61 kg/(m^2). Ideal Body Weight: Weight in (lb)  to have BMI = 25: 152.2  GEN: WDWN, NAD, Non-toxic, A & O x 3 HEENT: Atraumatic, Normocephalic. Neck supple. No masses, No LAD. Ears and Nose: No external deformity. CV: RRR, No M/G/R. No JVD. No thrill. No extra heart sounds. PULM: CTA B, no wheezes, crackles, rhonchi. No retractions. No resp. distress. No accessory muscle use. EXTR: No c/c/e NEURO Normal gait.  PSYCH: Normally interactive. Conversant. Not depressed or anxious appearing.  Calm demeanor.  Breast: normal exam, no masses/ dimpling/ discharge  Results for orders placed in visit on 05/31/14  POCT URINE PREGNANCY      Result Value Ref Range   Preg Test, Ur Negative      Assessment and Plan: Breast tenderness - Plan: POCT urine pregnancy, MM Digital Diagnostic Bilat  Immunization due - Plan: Hepatitis A vaccine adult IM, Hepatitis B vaccine adult IM  Referral for mammogram to ensure all is well Hep A #1 and Hep B #1  today per her request  Signed Lamar Blinks, MD

## 2014-06-05 ENCOUNTER — Other Ambulatory Visit: Payer: Self-pay | Admitting: Family Medicine

## 2014-06-05 ENCOUNTER — Ambulatory Visit
Admission: RE | Admit: 2014-06-05 | Discharge: 2014-06-05 | Disposition: A | Discharge status: Home / Self Care | Payer: Federal, State, Local not specified - PPO | Source: Ambulatory Visit | Attending: Family Medicine | Admitting: Family Medicine

## 2014-06-05 ENCOUNTER — Encounter (INDEPENDENT_AMBULATORY_CARE_PROVIDER_SITE_OTHER): Payer: Self-pay

## 2014-06-05 DIAGNOSIS — N644 Mastodynia: Secondary | ICD-10-CM

## 2014-06-14 ENCOUNTER — Ambulatory Visit (INDEPENDENT_AMBULATORY_CARE_PROVIDER_SITE_OTHER): Payer: BC Managed Care – PPO | Admitting: Emergency Medicine

## 2014-06-14 VITALS — BP 124/72 | HR 83 | Temp 97.7°F | Resp 16 | Ht 65.5 in | Wt 138.6 lb

## 2014-06-14 DIAGNOSIS — R5383 Other fatigue: Secondary | ICD-10-CM

## 2014-06-14 DIAGNOSIS — R5381 Other malaise: Secondary | ICD-10-CM

## 2014-06-14 DIAGNOSIS — A779 Spotted fever, unspecified: Secondary | ICD-10-CM

## 2014-06-14 DIAGNOSIS — A770 Spotted fever due to Rickettsia rickettsii: Secondary | ICD-10-CM

## 2014-06-14 DIAGNOSIS — IMO0001 Reserved for inherently not codable concepts without codable children: Secondary | ICD-10-CM

## 2014-06-14 DIAGNOSIS — R110 Nausea: Secondary | ICD-10-CM

## 2014-06-14 DIAGNOSIS — R1031 Right lower quadrant pain: Secondary | ICD-10-CM

## 2014-06-14 LAB — POCT UA - MICROSCOPIC ONLY
Casts, Ur, LPF, POC: NEGATIVE
Crystals, Ur, HPF, POC: NEGATIVE
MUCUS UA: NEGATIVE
YEAST UA: NEGATIVE

## 2014-06-14 LAB — POCT CBC
GRANULOCYTE PERCENT: 59 % (ref 37–80)
HCT, POC: 44.2 % (ref 37.7–47.9)
Hemoglobin: 14.2 g/dL (ref 12.2–16.2)
Lymph, poc: 2 (ref 0.6–3.4)
MCH: 30.7 pg (ref 27–31.2)
MCHC: 32.2 g/dL (ref 31.8–35.4)
MCV: 95.1 fL (ref 80–97)
MID (cbc): 0.3 (ref 0–0.9)
MPV: 7.4 fL (ref 0–99.8)
PLATELET COUNT, POC: 243 10*3/uL (ref 142–424)
POC Granulocyte: 3.3 (ref 2–6.9)
POC LYMPH PERCENT: 35.4 %L (ref 10–50)
POC MID %: 5.6 % (ref 0–12)
RBC: 4.64 M/uL (ref 4.04–5.48)
RDW, POC: 13 %
WBC: 5.6 10*3/uL (ref 4.6–10.2)

## 2014-06-14 LAB — POCT URINALYSIS DIPSTICK
Bilirubin, UA: NEGATIVE
GLUCOSE UA: NEGATIVE
Ketones, UA: NEGATIVE
Leukocytes, UA: NEGATIVE
Nitrite, UA: NEGATIVE
Protein, UA: NEGATIVE
Spec Grav, UA: 1.015
UROBILINOGEN UA: 0.2
pH, UA: 6

## 2014-06-14 LAB — POCT SEDIMENTATION RATE: POCT SED RATE: 5 mm/hr (ref 0–22)

## 2014-06-14 NOTE — Patient Instructions (Signed)

## 2014-06-14 NOTE — Progress Notes (Signed)
[facility] [address], [place] [address] [phone]  Date:  06/14/2014   Name:  Mary Wyatt   DOB:  1986-07-27   MRN:  [id number]  PCP:  Marcille Blanco    Chief Complaint: Generalized Body Aches, Headache, Nausea and Leg Pain   History of Present Illness:  Mary Wyatt is a 28 y.o. very pleasant female patient who presents with the following:  Patient with long term pain in right lower quadrant and lower abdomen.  Previously worked up.  Has now nausea with most food and bloating.  No GERD.  Some bloating. No fever or chills.  No stool change, The patient has no complaint of blood, mucous, or pus in her stools. No GU or GYN symptoms.  No rash. Diffuse muscle and joint pain.  No history of injury.  Frequent easy bruising. Abdominal pain has been worked up with scopes and laparoscopy and sono GB No improvement with over the counter medications or other home remedies.  Denies other complaint or health concern today.   Patient Active Problem List   Diagnosis Date Noted  . Rhinitis, allergic 04/20/2014  . Generalized anxiety disorder 04/20/2014  . Celiac disease 12/15/2012  . Chest pain 03/04/2012  . Interstitial cystitis     Past Medical History  Diagnosis Date  . Interstitial cystitis   . Chest pain   . Anxiety   . Allergy   . Depression   . Anemia   . Ulcer   . GERD (gastroesophageal reflux disease)   . Genital warts     Past Surgical History  Procedure Laterality Date  . Tonsillectomy    . Ovarian cyst removal      History  Substance Use Topics  . Smoking status: Former Research scientist (life sciences)  . Smokeless tobacco: Never Used  . Alcohol Use: 0.0 oz/week    0-3 drink(s) per week     Comment: Occas.    Family History  Problem Relation Age of Onset  . GI problems Sister   . Asthma Sister   . Heart disease Maternal Grandmother   . Colon cancer Neg Hx     Allergies  Allergen Reactions  . Diflucan [Fluconazole] Hives  .  Fluconazole In Dextrose Hives    Medication list has been reviewed and updated.  Current Outpatient Prescriptions on File Prior to Visit  Medication Sig Dispense Refill  . ALPRAZolam (XANAX) 0.5 MG tablet Take 1 tablet (0.5 mg total) by mouth daily as needed for anxiety.  90 tablet  0  . fluticasone (FLONASE) 50 MCG/ACT nasal spray Place 2 sprays into the nose daily.  16 g  3  . montelukast (SINGULAIR) 10 MG tablet Take 1 tablet (10 mg total) by mouth at bedtime.  30 tablet  11  . Norethin Ace-Eth Estrad-FE (LOMEDIA 24 FE PO) Take by mouth.       No current facility-administered medications on file prior to visit.    Review of Systems:  As per HPI, otherwise negative.    Physical Examination: Filed Vitals:   06/14/14 1521  BP: 124/72  Pulse: 83  Temp: 97.7 F (36.5 C)  Resp: 16   Filed Vitals:   06/14/14 1521  Height: 5' 5.5" (1.664 m)  Weight: 138 lb 9.6 oz (62.869 kg)   Body mass index is 22.71 kg/(m^2). Ideal Body Weight: Weight in (lb) to have BMI = 25: 152.2  GEN: WDWN, NAD, Non-toxic, A & O x 3 HEENT: Atraumatic, Normocephalic. Neck supple.  No masses, No LAD. Ears and Nose: No external deformity. CV: RRR, No M/G/R. No JVD. No thrill. No extra heart sounds. PULM: CTA B, no wheezes, crackles, rhonchi. No retractions. No resp. distress. No accessory muscle use. ABD: S, lower abdominal tenderness worse in right, ND, +BS. No rebound. No HSM. EXTR: No c/c/e NEURO Normal gait.  PSYCH: Normally interactive. Conversant. Not depressed or anxious appearing.  Calm demeanor.    Assessment and Plan: Myalgias, nausea and vomiting Abdominal pain Labs pending  Signed,  Ellison Carwin, MD

## 2014-06-15 LAB — COMPREHENSIVE METABOLIC PANEL
ALT: 17 U/L (ref 0–35)
AST: 17 U/L (ref 0–37)
Albumin: 4.9 g/dL (ref 3.5–5.2)
Alkaline Phosphatase: 39 U/L (ref 39–117)
BUN: 10 mg/dL (ref 6–23)
CHLORIDE: 102 meq/L (ref 96–112)
CO2: 25 mEq/L (ref 19–32)
CREATININE: 0.61 mg/dL (ref 0.50–1.10)
Calcium: 9.7 mg/dL (ref 8.4–10.5)
Glucose, Bld: 82 mg/dL (ref 70–99)
POTASSIUM: 4.4 meq/L (ref 3.5–5.3)
SODIUM: 138 meq/L (ref 135–145)
Total Bilirubin: 0.5 mg/dL (ref 0.2–1.2)
Total Protein: 7.5 g/dL (ref 6.0–8.3)

## 2014-06-15 LAB — TSH: TSH: 1.342 u[IU]/mL (ref 0.350–4.500)

## 2014-06-15 LAB — LYME AB/WESTERN BLOT REFLEX: B burgdorferi Ab IgG+IgM: 0.81 {ISR}

## 2014-06-16 LAB — ROCKY MTN SPOTTED FVR AB, IGM-BLOOD: ROCKY MTN SPOTTED FEVER, IGM: 1.49 IV

## 2014-06-16 MED ORDER — DOXYCYCLINE HYCLATE 100 MG PO CAPS: 100.0000 mg | ORAL_CAPSULE | Freq: Two times a day (BID) | ORAL | Status: DC

## 2014-06-16 NOTE — Addendum Note (Signed)
Addended by: Roselee Culver on: 06/16/2014 01:06 PM   Modules accepted: Orders

## 2014-06-28 ENCOUNTER — Ambulatory Visit (INDEPENDENT_AMBULATORY_CARE_PROVIDER_SITE_OTHER): Payer: BC Managed Care – PPO | Admitting: Emergency Medicine

## 2014-06-28 VITALS — BP 102/67 | HR 71 | Temp 97.7°F | Resp 16 | Ht 65.0 in | Wt 139.2 lb

## 2014-06-28 DIAGNOSIS — Z23 Encounter for immunization: Secondary | ICD-10-CM

## 2014-06-28 DIAGNOSIS — A779 Spotted fever, unspecified: Secondary | ICD-10-CM

## 2014-06-28 DIAGNOSIS — A770 Spotted fever due to Rickettsia rickettsii: Secondary | ICD-10-CM

## 2014-06-28 LAB — EHRLICHIA ANTIBODY PANEL: E chaffeensis (HGE) Ab, IgM: <1:20 {titer}

## 2014-06-28 NOTE — Progress Notes (Signed)
[facility] [address], [place] [address] [phone]  Date:  06/28/2014   Name:  BENIGNA DELISI   DOB:  1986-08-19   MRN:  [id number]  PCP:  Marcille Blanco    Chief Complaint: Follow-up, Flu Vaccine and Hepatitis B   History of Present Illness:  Mary Wyatt is a 28 y.o. very pleasant female patient who presents with the following:  Treated 10 days ago for RMSF and has experienced an intermittent rash on her face and a generalized feeling of pruritis.   Is afebrile.   Needs an updated hep b and a flu shot. Denies other complaint or health concern today.   Patient Active Problem List   Diagnosis Date Noted  . Rhinitis, allergic 04/20/2014  . Generalized anxiety disorder 04/20/2014  . Celiac disease 12/15/2012  . Chest pain 03/04/2012  . Interstitial cystitis     Past Medical History  Diagnosis Date  . Interstitial cystitis   . Chest pain   . Anxiety   . Allergy   . Depression   . Anemia   . Ulcer   . GERD (gastroesophageal reflux disease)   . Genital warts     Past Surgical History  Procedure Laterality Date  . Tonsillectomy    . Ovarian cyst removal      History  Substance Use Topics  . Smoking status: Former Research scientist (life sciences)  . Smokeless tobacco: Never Used  . Alcohol Use: 0.0 oz/week    0-3 drink(s) per week     Comment: Occas.    Family History  Problem Relation Age of Onset  . GI problems Sister   . Asthma Sister   . Heart disease Maternal Grandmother   . Colon cancer Neg Hx     Allergies  Allergen Reactions  . Diflucan [Fluconazole] Hives  . Fluconazole In Dextrose Hives    Medication list has been reviewed and updated.  Current Outpatient Prescriptions on File Prior to Visit  Medication Sig Dispense Refill  . ALPRAZolam (XANAX) 0.5 MG tablet Take 1 tablet (0.5 mg total) by mouth daily as needed for anxiety.  90 tablet  0  . doxycycline (VIBRAMYCIN) 100 MG capsule Take 1 capsule (100 mg total) by mouth 2  (two) times daily.  40 capsule  0  . fluticasone (FLONASE) 50 MCG/ACT nasal spray Place 2 sprays into the nose daily.  16 g  3  . montelukast (SINGULAIR) 10 MG tablet Take 1 tablet (10 mg total) by mouth at bedtime.  30 tablet  11  . Norethin Ace-Eth Estrad-FE (LOMEDIA 24 FE PO) Take by mouth.       No current facility-administered medications on file prior to visit.    Review of Systems:  As per HPI, otherwise negative.    Physical Examination: Filed Vitals:   06/28/14 1443  BP: 102/67  Pulse: 71  Temp: 97.7 F (36.5 C)  Resp: 16   Filed Vitals:   06/28/14 1443  Height: 5' 5"  (1.651 m)  Weight: 139 lb 3.2 oz (63.141 kg)   Body mass index is 23.16 kg/(m^2). Ideal Body Weight: Weight in (lb) to have BMI = 25: 149.9   GEN: WDWN, NAD, Non-toxic, Alert & Oriented x 3 HEENT: Atraumatic, Normocephalic.  Ears and Nose: No external deformity. EXTR: No clubbing/cyanosis/edema NEURO: Normal gait.  PSYCH: Normally interactive. Conversant. Not depressed or anxious appearing.  Calm demeanor.  No rash   Assessment and Plan: RMSF and is afebrile so advised to stop  the doxy in presence of likely reaction to drug. Immunizations  Signed,  Ellison Carwin, MD

## 2014-07-12 ENCOUNTER — Other Ambulatory Visit: Payer: Self-pay | Admitting: Family Medicine

## 2014-07-13 NOTE — Telephone Encounter (Signed)
Please call in refill of Xanax to pharmacy; I will not be in the office until later today.

## 2014-07-13 NOTE — Telephone Encounter (Signed)
Called into pharmacy

## 2014-07-26 ENCOUNTER — Ambulatory Visit (INDEPENDENT_AMBULATORY_CARE_PROVIDER_SITE_OTHER): Payer: Federal, State, Local not specified - PPO | Admitting: Family Medicine

## 2014-07-26 VITALS — BP 122/76 | HR 91 | Temp 98.0°F | Resp 17 | Ht 65.5 in | Wt 140.0 lb

## 2014-07-26 DIAGNOSIS — R5383 Other fatigue: Secondary | ICD-10-CM

## 2014-07-26 DIAGNOSIS — R319 Hematuria, unspecified: Secondary | ICD-10-CM

## 2014-07-26 DIAGNOSIS — R5381 Other malaise: Secondary | ICD-10-CM

## 2014-07-26 DIAGNOSIS — M79602 Pain in left arm: Secondary | ICD-10-CM

## 2014-07-26 LAB — POCT CBC
Granulocyte percent: 62.5 %G (ref 37–80)
HCT, POC: 40.2 % (ref 37.7–47.9)
Hemoglobin: 13.3 g/dL (ref 12.2–16.2)
Lymph, poc: 1.7 (ref 0.6–3.4)
MCH, POC: 31.4 pg — AB (ref 27–31.2)
MCHC: 33.1 g/dL (ref 31.8–35.4)
MCV: 94.9 fL (ref 80–97)
MID (cbc): 0.2 (ref 0–0.9)
MPV: 7.7 fL (ref 0–99.8)
POC Granulocyte: 3.2 (ref 2–6.9)
POC LYMPH PERCENT: 33.1 %L (ref 10–50)
POC MID %: 4.4 %M (ref 0–12)
Platelet Count, POC: 231 10*3/uL (ref 142–424)
RBC: 4.23 M/uL (ref 4.04–5.48)
RDW, POC: 12.8 %
WBC: 5.1 10*3/uL (ref 4.6–10.2)

## 2014-07-26 LAB — POCT URINALYSIS DIPSTICK
Bilirubin, UA: NEGATIVE
Glucose, UA: NEGATIVE
KETONES UA: NEGATIVE
Nitrite, UA: NEGATIVE
PH UA: 5.5
PROTEIN UA: NEGATIVE
Spec Grav, UA: 1.025
Urobilinogen, UA: 0.2

## 2014-07-26 LAB — POCT UA - MICROSCOPIC ONLY
CRYSTALS, UR, HPF, POC: NEGATIVE
Casts, Ur, LPF, POC: NEGATIVE
Mucus, UA: NEGATIVE
Yeast, UA: NEGATIVE

## 2014-07-26 LAB — POCT URINE PREGNANCY: Preg Test, Ur: NEGATIVE

## 2014-07-26 MED ORDER — MELOXICAM 7.5 MG PO TABS: 7.5000 mg | ORAL_TABLET | Freq: Every day | ORAL | Status: DC

## 2014-07-26 NOTE — Patient Instructions (Signed)
I will be in touch with the rest of your labs.  Use the mobic once a day for your arm pain.  If your arm is not well in the next couple of weeks we will do an x-ray.  Let me know if you are getting worse in any way

## 2014-07-26 NOTE — Progress Notes (Signed)
[facility] [address], [place] [address] [phone]  Date:  07/26/2014   Name:  Mary Wyatt   DOB:  [birth year]   MRN:  [id number]  PCP:  Marcille Blanco    Chief Complaint: Generalized Body Aches, Fatigue, Arm Pain and Cough   History of Present Illness:  Mary Wyatt is a 28 y.o. very pleasant female patient who presents with the following:  Here today with illness; she was seen about a month ago and was told that she might have RMSF.  She was treated for RMSF with doxycycline for 18 days or so; did not finsih the whole rx because she had a rash.   She got a flu shot and hep B shot the last time she was here.  She notes persistent pain in her left arm she thinks from the shot. (flu shot went in the left).  No heat or redness, but she has pain in the deltoid when she needs to fully flex or abduct the arm She was feeling better at her last visit. Now she "just feels so run down."  She feels "out of it, worn out."  Sleep does not seem to help her feel better.   She has had some cough for a couple of weeks- this is not generally severe.   She has noted a runny nose but "I do have bad allergies."   She has not noted a fever, no GI symptoms.  LMP: she does not generally get bleeding on her OCP.    Patient Active Problem List   Diagnosis Date Noted  . Rhinitis, allergic 04/20/2014  . Generalized anxiety disorder 04/20/2014  . Celiac disease 12/15/2012  . Chest pain 03/04/2012  . Interstitial cystitis     Past Medical History  Diagnosis Date  . Interstitial cystitis   . Chest pain   . Anxiety   . Allergy   . Depression   . Anemia   . Ulcer   . GERD (gastroesophageal reflux disease)   . Genital warts     Past Surgical History  Procedure Laterality Date  . Tonsillectomy    . Ovarian cyst removal      History  Substance Use Topics  . Smoking status: Former Research scientist (life sciences)  . Smokeless tobacco: Never Used  . Alcohol Use: 0.0 oz/week   0-3 drink(s) per week     Comment: Occas.    Family History  Problem Relation Age of Onset  . GI problems Sister   . Asthma Sister   . Heart disease Maternal Grandmother   . Colon cancer Neg Hx     Allergies  Allergen Reactions  . Diflucan [Fluconazole] Hives  . Fluconazole In Dextrose Hives    Medication list has been reviewed and updated.  Current Outpatient Prescriptions on File Prior to Visit  Medication Sig Dispense Refill  . ALPRAZolam (XANAX) 0.5 MG tablet TAKE ONE TABLET BY MOUTH DAILY AS NEEDED FOR ANXIETY  90 tablet  0  . montelukast (SINGULAIR) 10 MG tablet Take 1 tablet (10 mg total) by mouth at bedtime.  30 tablet  11  . Norethin Ace-Eth Estrad-FE (LOMEDIA 24 FE PO) Take by mouth.       No current facility-administered medications on file prior to visit.    Review of Systems:  As per HPI- otherwise negative.   Physical Examination: Filed Vitals:   07/26/14 1404  BP: 122/76  Pulse: 91  Temp: 98 F (36.7 C)  Resp: 17  Filed Vitals:   07/26/14 1404  Height: 5' 5.5" (1.664 m)  Weight: 140 lb (63.504 kg)   Body mass index is 22.93 kg/(m^2). Ideal Body Weight: Weight in (lb) to have BMI = 25: 152.2  GEN: WDWN, NAD, Non-toxic, A & O x 3, looks well HEENT: Atraumatic, Normocephalic. Neck supple. No masses, No LAD. Ears and Nose: No external deformity. CV: RRR, No M/G/R. No JVD. No thrill. No extra heart sounds. PULM: CTA B, no wheezes, crackles, rhonchi. No retractions. No resp. distress. No accessory muscle use. ABD: S, NT, ND, +BS. No rebound. No HSM. EXTR: No c/c/e NEURO Normal gait.  PSYCH: Normally interactive. Conversant. Not depressed or anxious appearing.  Calm demeanor.  She is slightly tender over the left deltoid, and has discomfort with full flexion or abduction of the left arm  Results for orders placed in visit on 07/26/14  POCT CBC      Result Value Ref Range   WBC 5.1  4.6 - 10.2 K/uL   Lymph, poc 1.7  0.6 - 3.4   POC LYMPH  PERCENT 33.1  10 - 50 %L   MID (cbc) 0.2  0 - 0.9   POC MID % 4.4  0 - 12 %M   POC Granulocyte 3.2  2 - 6.9   Granulocyte percent 62.5  37 - 80 %G   RBC 4.23  4.04 - 5.48 M/uL   Hemoglobin 13.3  12.2 - 16.2 g/dL   HCT, POC 40.2  37.7 - 47.9 %   MCV 94.9  80 - 97 fL   MCH, POC 31.4 (*) 27 - 31.2 pg   MCHC 33.1  31.8 - 35.4 g/dL   RDW, POC 12.8     Platelet Count, POC 231  142 - 424 K/uL   MPV 7.7  0 - 99.8 fL  POCT UA - MICROSCOPIC ONLY      Result Value Ref Range   WBC, Ur, HPF, POC 0-2     RBC, urine, microscopic 1-9     Bacteria, U Microscopic Trace     Mucus, UA neg     Epithelial cells, urine per micros 2-5     Crystals, Ur, HPF, POC neg     Casts, Ur, LPF, POC Neg     Yeast, UA Neg    POCT URINALYSIS DIPSTICK      Result Value Ref Range   Color, UA Yellow     Clarity, UA Cler     Glucose, UA Neg     Bilirubin, UA Neg     Ketones, UA neg     Spec Grav, UA 1.025     Blood, UA Small     pH, UA 5.5     Protein, UA Neg     Urobilinogen, UA 0.2     Nitrite, UA Neg     Leukocytes, UA Trace    POCT URINE PREGNANCY      Result Value Ref Range   Preg Test, Ur Negative     Assessment and Plan: Malaise and fatigue - Plan: POCT CBC, POCT UA - Microscopic Only, POCT urinalysis dipstick, POCT urine pregnancy, Sedimentation Rate, Rheumatoid factor, [place] spotted fvr ab, IgM-blood  Left arm pain - Plan: meloxicam (MOBIC) 7.5 MG tablet  Hematuria - Plan: Urine culture  mobic for left arm soreness after flu shot- unsure if related to shot.  She would like to follow-up on her RMSF titers today; will do so.  Also labs as above in  attempt to find any other cause of her malaise.    Signed Lamar Blinks, MD  She would use the [facility] in [place] if needed; she is going OOT tomorow

## 2014-07-27 LAB — SEDIMENTATION RATE: Sed Rate: 4 mm/hr (ref 0–22)

## 2014-07-27 LAB — URINE CULTURE
Colony Count: NO GROWTH
ORGANISM ID, BACTERIA: NO GROWTH

## 2014-07-27 LAB — RHEUMATOID FACTOR: Rhuematoid fact SerPl-aCnc: <10 IU/mL (ref <=14)

## 2014-08-01 LAB — ROCKY MTN SPOTTED FVR AB, IGM-BLOOD: ROCKY MTN SPOTTED FEVER, IGM: 5.33 IV — AB

## 2014-08-17 ENCOUNTER — Other Ambulatory Visit: Payer: Self-pay | Admitting: Obstetrics and Gynecology

## 2014-08-21 LAB — CYTOLOGY - PAP

## 2014-09-18 ENCOUNTER — Ambulatory Visit (INDEPENDENT_AMBULATORY_CARE_PROVIDER_SITE_OTHER): Payer: Federal, State, Local not specified - PPO | Admitting: Family Medicine

## 2014-09-18 VITALS — BP 116/74 | HR 76 | Temp 98.1°F | Resp 16 | Ht 66.0 in | Wt 136.4 lb

## 2014-09-18 DIAGNOSIS — R300 Dysuria: Secondary | ICD-10-CM

## 2014-09-18 DIAGNOSIS — N760 Acute vaginitis: Secondary | ICD-10-CM

## 2014-09-18 DIAGNOSIS — N390 Urinary tract infection, site not specified: Secondary | ICD-10-CM

## 2014-09-18 LAB — POCT URINALYSIS DIPSTICK
BILIRUBIN UA: NEGATIVE
GLUCOSE UA: NEGATIVE
Ketones, UA: NEGATIVE
NITRITE UA: NEGATIVE
Protein, UA: NEGATIVE
Spec Grav, UA: 1.02
Urobilinogen, UA: 0.2
pH, UA: 5.5

## 2014-09-18 LAB — POCT WET PREP WITH KOH
KOH Prep POC: NEGATIVE
Trichomonas, UA: NEGATIVE
Yeast Wet Prep HPF POC: NEGATIVE

## 2014-09-18 LAB — POCT UA - MICROSCOPIC ONLY
Casts, Ur, LPF, POC: NEGATIVE
Crystals, Ur, HPF, POC: NEGATIVE
YEAST UA: NEGATIVE

## 2014-09-18 MED ORDER — CIPROFLOXACIN HCL 500 MG PO TABS: 500.0000 mg | ORAL_TABLET | Freq: Two times a day (BID) | ORAL | Status: DC

## 2014-09-18 MED ORDER — BORIC ACID POWD: Status: DC

## 2014-09-18 NOTE — Progress Notes (Signed)
Chief Complaint:  Chief Complaint  Patient presents with  . Dysuria    sxs started this morning  . burning sensation    pt states it feels uncomfortable    HPI: Mary Wyatt is a 28 y.o. female who is here for dysuria since this morning, similar sxs to prior UTIs Has a hx of UTI  and also BV, has BV oftern, She had flagyl for BV recently  Has a hx of interstitia; cyctitis as well and ovarian cysts No fevers or chill;s, nausea or vomiting or abd pain On birth control, does not miss it    Her Ob is Dr Corinna Capra  Past Medical History  Diagnosis Date  . Interstitial cystitis   . Chest pain   . Anxiety   . Allergy   . Depression   . Anemia   . Ulcer   . GERD (gastroesophageal reflux disease)   . Genital warts    Past Surgical History  Procedure Laterality Date  . Tonsillectomy    . Ovarian cyst removal     History   Social History  . Marital Status: Single    Spouse Name: N/A    Number of Children: 0  . Years of Education: N/A   Occupational History  . [place]   Social History Main Topics  . Smoking status: Former Research scientist (life sciences)  . Smokeless tobacco: Never Used  . Alcohol Use: 0.0 oz/week    0-3 drink(s) per week     Comment: Occas.  . Drug Use: No  . Sexual Activity: Yes    Birth Control/ Protection: Pill   Other Topics Concern  . 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 GI problems Sister   . Asthma Sister   . Heart disease Maternal Grandmother   . Colon cancer Neg Hx    Allergies  Allergen Reactions  . Diflucan [Fluconazole] Hives  . Fluconazole In Dextrose Hives   Prior to  Admission medications   Medication Sig Start Date End Date Taking? Authorizing Provider  ALPRAZolam Duanne Moron) 0.5 MG tablet TAKE ONE TABLET BY MOUTH DAILY AS NEEDED FOR ANXIETY 07/13/14  Yes Wardell Honour, MD  Norethin Ace-Eth Estrad-FE (LOMEDIA 24 FE PO) Take by mouth.   Yes Historical Provider, MD     ROS: The patient denies fevers, chills, night sweats, unintentional weight loss, chest pain, palpitations, wheezing, dyspnea on exertion, nausea, vomiting, abdominal pain,  hematuria, melena, numbness, weakness, or tingling.  All other systems have been reviewed and were otherwise negative with the exception of those mentioned in the HPI and as above.    PHYSICAL EXAM: Filed Vitals:   09/18/14 1418  BP: 116/74  Pulse: 76  Temp: 98.1 F (36.7 C)  Resp: 16   Filed Vitals:   09/18/14 1418  Height: 5' 6"  (1.676 m)  Weight: 136 lb 6.4 oz (61.871 kg)   Body mass index is 22.03 kg/(m^2).  General:  Alert, no acute distress HEENT:  Normocephalic, atraumatic, oropharynx patent. EOMI, PERRLA Cardiovascular:  Regular rate and rhythm, no rubs murmurs or gallops. Radial pulse intact. No pedal edema.  Respiratory: Clear to auscultation bilaterally.  No wheezes, rales, or rhonchi.  No cyanosis, no use of accessory musculature GI: No organomegaly, abdomen is soft and non-tender, positive bowel sounds.  No masses. Skin: No rashes. Neurologic: Facial musculature symmetric. Psychiatric: Patient is appropriate throughout our interaction. Lymphatic: No cervical lymphadenopathy Musculoskeletal: Gait intact. No CVa tenderness   LABS: Results for orders placed or performed in visit on 09/18/14  POCT UA - Microscopic Only  Result Value Ref Range   WBC, Ur, HPF, POC 4-8    RBC, urine, microscopic 0-4    Bacteria, U Microscopic trace    Mucus, UA trace    Epithelial cells, urine per micros 3-4    Crystals, Ur, HPF, POC neg    Casts, Ur, LPF, POC neg    Yeast, UA neg   POCT urinalysis dipstick    Result Value Ref Range   Color, UA yellow    Clarity, UA clear    Glucose, UA neg    Bilirubin, UA neg    Ketones, UA neg    Spec Grav, UA 1.020    Blood, UA moderate    pH, UA 5.5    Protein, UA neg    Urobilinogen, UA 0.2    Nitrite, UA neg    Leukocytes, UA small (1+)   POCT Wet Prep with KOH  Result Value Ref Range   Trichomonas, UA Negative    Clue Cells Wet Prep HPF POC 0-3    Epithelial Wet Prep HPF POC 10-15    Yeast Wet Prep HPF POC neg    Bacteria Wet Prep HPF POC trace    RBC Wet Prep HPF POC 0-4    WBC Wet Prep HPF POC 0-3    KOH Prep POC Negative      EKG/XRAY:   Primary read interpreted by Dr. Marin Comment at [facility].   ASSESSMENT/PLAN: Encounter Diagnoses  Name Primary?  . Dysuria Yes  . Vaginitis   . UTI (urinary tract infection), uncomplicated    Cipro 371 mg BID x 5 days Urine cx pending F/u prn    Gross sideeffects, risk and benefits, and alternatives of medications d/w patient. Patient is aware that all medications have potential sideeffects and we are unable to predict every sideeffect or drug-drug interaction that may occur.  Cayenne Breault, Arthur, DO 09/18/2014 3:43 PM

## 2014-09-18 NOTE — Patient Instructions (Signed)
Urinary Tract Infection Urinary tract infections (UTIs) can develop anywhere along your urinary tract. Your urinary tract is your body's drainage system for removing wastes and extra water. Your urinary tract includes two kidneys, two ureters, a bladder, and a urethra. Your kidneys are a pair of bean-shaped organs. Each kidney is about the size of your fist. They are located below your ribs, one on each side of your spine. CAUSES Infections are caused by microbes, which are microscopic organisms, including fungi, viruses, and bacteria. These organisms are so small that they can only be seen through a microscope. Bacteria are the microbes that most commonly cause UTIs. SYMPTOMS  Symptoms of UTIs may vary by age and gender of the patient and by the location of the infection. Symptoms in young women typically include a frequent and intense urge to urinate and a painful, burning feeling in the bladder or urethra during urination. Older women and men are more likely to be tired, shaky, and weak and have muscle aches and abdominal pain. A fever may mean the infection is in your kidneys. Other symptoms of a kidney infection include pain in your back or sides below the ribs, nausea, and vomiting. DIAGNOSIS To diagnose a UTI, your caregiver will ask you about your symptoms. Your caregiver also will ask to provide a urine sample. The urine sample will be tested for bacteria and white blood cells. White blood cells are made by your body to help fight infection. TREATMENT  Typically, UTIs can be treated with medication. Because most UTIs are caused by a bacterial infection, they usually can be treated with the use of antibiotics. The choice of antibiotic and length of treatment depend on your symptoms and the type of bacteria causing your infection. HOME CARE INSTRUCTIONS  If you were prescribed antibiotics, take them exactly as your caregiver instructs you. Finish the medication even if you feel better after you  have only taken some of the medication.  Drink enough water and fluids to keep your urine clear or pale yellow.  Avoid caffeine, tea, and carbonated beverages. They tend to irritate your bladder.  Empty your bladder often. Avoid holding urine for long periods of time.  Empty your bladder before and after sexual intercourse.  After a bowel movement, women should cleanse from front to back. Use each tissue only once. SEEK MEDICAL CARE IF:   You have back pain.  You develop a fever.  Your symptoms do not begin to resolve within 3 days. SEEK IMMEDIATE MEDICAL CARE IF:   You have severe back pain or lower abdominal pain.  You develop chills.  You have nausea or vomiting.  You have continued burning or discomfort with urination. MAKE SURE YOU:   Understand these instructions.  Will watch your condition.  Will get help right away if you are not doing well or get worse. Document Released: 07/16/2005 Document Revised: 04/06/2012 Document Reviewed: 11/14/2011 [facility] Patient Information 2015 [place], [place]. This information is not intended to replace advice given to you by your health care provider. Make sure you discuss any questions you have with your health care provider.  Bacterial Vaginosis Bacterial vaginosis is a vaginal infection that occurs when the normal balance of bacteria in the vagina is disrupted. It results from an overgrowth of certain bacteria. This is the most common vaginal infection in women of childbearing age. Treatment is important to prevent complications, especially in pregnant women, as it can cause a premature delivery. CAUSES  Bacterial vaginosis is caused by an  increase in harmful bacteria that are normally present in smaller amounts in the vagina. Several different kinds of bacteria can cause bacterial vaginosis. However, the reason that the condition develops is not fully understood. RISK FACTORS Certain activities or behaviors can put you at an  increased risk of developing bacterial vaginosis, including:  Having a new sex partner or multiple sex partners.  Douching.  Using an intrauterine device (IUD) for contraception. Women do not get bacterial vaginosis from toilet seats, bedding, swimming pools, or contact with objects around them. SIGNS AND SYMPTOMS  Some women with bacterial vaginosis have no signs or symptoms. Common symptoms include:  Grey vaginal discharge.  A fishlike odor with discharge, especially after sexual intercourse.  Itching or burning of the vagina and vulva.  Burning or pain with urination. DIAGNOSIS  Your health care provider will take a medical history and examine the vagina for signs of bacterial vaginosis. A sample of vaginal fluid may be taken. Your health care provider will look at this sample under a microscope to check for bacteria and abnormal cells. A vaginal pH test may also be done.  TREATMENT  Bacterial vaginosis may be treated with antibiotic medicines. These may be given in the form of a pill or a vaginal cream. A second round of antibiotics may be prescribed if the condition comes back after treatment.  HOME CARE INSTRUCTIONS   Only take over-the-counter or prescription medicines as directed by your health care provider.  If antibiotic medicine was prescribed, take it as directed. Make sure you finish it even if you start to feel better.  Do not have sex until treatment is completed.  Tell all sexual partners that you have a vaginal infection. They should see their health care provider and be treated if they have problems, such as a mild rash or itching.  Practice safe sex by using condoms and only having one sex partner. SEEK MEDICAL CARE IF:   Your symptoms are not improving after 3 days of treatment.  You have increased discharge or pain.  You have a fever. MAKE SURE YOU:   Understand these instructions.  Will watch your condition.  Will get help right away if you are not  doing well or get worse. FOR MORE INFORMATION  Centers for Disease Control and Prevention, Division of STD Prevention: AppraiserFraud[employer]fi American Sexual Health Association (ASHA): www.ashastd.org  Document Released: 10/06/2005 Document Revised: 07/27/2013 Document Reviewed: 05/18/2013 [facility] Patient Information 2015 [place], [place]. This information is not intended to replace advice given to you by your health care provider. Make sure you discuss any questions you have with your health care provider.

## 2014-09-19 LAB — URINE CULTURE: Colony Count: 40000

## 2014-11-30 ENCOUNTER — Ambulatory Visit (INDEPENDENT_AMBULATORY_CARE_PROVIDER_SITE_OTHER): Payer: BLUE CROSS/BLUE SHIELD | Admitting: Family Medicine

## 2014-11-30 VITALS — BP 120/82 | HR 71 | Temp 98.2°F | Resp 16 | Ht 65.25 in | Wt 130.4 lb

## 2014-11-30 DIAGNOSIS — B9689 Other specified bacterial agents as the cause of diseases classified elsewhere: Secondary | ICD-10-CM

## 2014-11-30 DIAGNOSIS — N949 Unspecified condition associated with female genital organs and menstrual cycle: Secondary | ICD-10-CM

## 2014-11-30 DIAGNOSIS — A499 Bacterial infection, unspecified: Secondary | ICD-10-CM

## 2014-11-30 DIAGNOSIS — Z23 Encounter for immunization: Secondary | ICD-10-CM

## 2014-11-30 DIAGNOSIS — N760 Acute vaginitis: Secondary | ICD-10-CM

## 2014-11-30 LAB — POCT URINALYSIS DIPSTICK
Glucose, UA: NEGATIVE
KETONES UA: NEGATIVE
LEUKOCYTES UA: NEGATIVE
PH UA: 6
PROTEIN UA: NEGATIVE
Spec Grav, UA: 1.025
Urobilinogen, UA: 0.2

## 2014-11-30 LAB — POCT WET PREP WITH KOH
Clue Cells Wet Prep HPF POC: 100
KOH PREP POC: NEGATIVE
RBC WET PREP PER HPF POC: NEGATIVE
Trichomonas, UA: NEGATIVE
Yeast Wet Prep HPF POC: NEGATIVE

## 2014-11-30 LAB — POCT UA - MICROSCOPIC ONLY
Bacteria, U Microscopic: NEGATIVE
CRYSTALS, UR, HPF, POC: NEGATIVE
Casts, Ur, LPF, POC: NEGATIVE
Mucus, UA: NEGATIVE
WBC, Ur, HPF, POC: NEGATIVE
YEAST UA: NEGATIVE

## 2014-11-30 LAB — POCT URINE PREGNANCY: PREG TEST UR: NEGATIVE

## 2014-11-30 MED ORDER — METRONIDAZOLE 500 MG PO TABS: 500.0000 mg | ORAL_TABLET | Freq: Two times a day (BID) | ORAL | Status: DC

## 2014-11-30 NOTE — Patient Instructions (Addendum)
We are going to treat you for BV with flagyl 500 twice a day for one week.  Let me know if you do not feel better!  I do not think there will be any danger with ytou using flagyl given your diflucan allergy but please double check with the pharmacist.   You can also use the boric acid suppositories if you like.   I will be in touch with the rest of your labs   Hepatitis A Vaccine: What You Need to Know 1. What is hepatitis A? Hepatitis A is a serious liver disease caused by the hepatitis A virus (HAV). HAV is found in the stool of people with hepatitis A. It is usually spread by close personal contact and sometimes by eating food or drinking water containing HAV. A person who has hepatitis A can easily pass the disease to others within the same household. Hepatitis A can cause:  "flu-like" illness  jaundice (yellow skin or eyes, dark urine)  severe stomach pains and diarrhea (children) People with hepatitis A often have to be hospitalized (up to about 1 person in 5). Adults with hepatitis A are often too ill to work for up to a month. Sometimes, people die as a result of hepatitis A (about 3-6 deaths per 1,000 cases). Hepatitis A vaccine can prevent hepatitis A. 2. Who should get hepatitis A vaccine and when? WHO Some people should be routinely vaccinated with hepatitis A vaccine:  All children between their first and second birthdays (26 through 81 months of age).  Anyone 1 year of age and older traveling to or working in countries with high or intermediate prevalence of hepatitis A, such as those located in [place] or [place], [place], [place] (except [place]), [place], and [place]. For more information see BlindResource.ca.  Children and adolescents 2 through 33 years of age who live in states or communities where routine vaccination has been implemented because of high disease incidence.  Men who have sex with men.  People who use street drugs.  People with chronic liver  disease.  People who are treated with clotting factor concentrates.  People who work with HAV-infected primates or who work with HAV in Therapist, art.  Members of households planning to adopt a child, or care for a newly arriving adopted child, from a country where hepatitis A is common. Other people might get hepatitis A vaccine in certain situations (ask your doctor for more details):  Unvaccinated children or adolescents in communities where outbreaks of hepatitis A are occurring.  Unvaccinated people who have been exposed to hepatitis A virus.  Anyone 1 year of age or older who wants protection from hepatitis A. Hepatitis A vaccine is not licensed for children younger than 1 year of age. WHEN For children, the first dose should be given at 65 through 27 months of age. Children who are not vaccinated by 65 years of age can be vaccinated at later visits. For others at risk, the hepatitis A vaccine series may be started whenever a person wishes to be protected or is at risk of infection. For travelers, it is best to start the vaccine series at least 1 month before traveling. (Some protection may still result if the vaccine is given on or closer to the travel date.) Some people who cannot get the vaccine before traveling, or for whom the vaccine might not be effective, can get a shot called immune globulin (IG). IG gives immediate, temporary protection. Two doses of the vaccine are needed  for lasting protection. These doses should be given at least 6 months apart. Hepatitis A vaccine may be given at the same time as other vaccines. 3. Some people should not get hepatitis A vaccine or should wait.  Anyone who has ever had a severe (life threatening) allergic reaction to a previous dose of hepatitis A vaccine should not get another dose.  Anyone who has a severe (life threatening) allergy to any vaccine component should not get the vaccine.  Tell your doctor if you have any severe  allergies, including a severe allergy to latex. All hepatitis A vaccines contain alum, and some hepatitis A vaccines contain 2-phenoxyethanol.  Anyone who is moderately or severely ill at the time the shot is scheduled should probably wait until they recover. Ask your doctor. People with a mild illness can usually get the vaccine.  Tell your doctor if you are pregnant. Because hepatitis A vaccine is inactivated (killed), the risk to a pregnant woman or her unborn baby is believed to be very low. But your doctor can weigh any theoretical risk from the vaccine against the need for protection. 4. What are the risks from hepatitis A vaccine? A vaccine, like any medicine, could possibly cause serious problems, such as severe allergic reactions. The risk of hepatitis A vaccine causing serious harm, or death, is extremely small. Getting hepatitis A vaccine is much safer than getting the disease. Mild problems  soreness where the shot was given (about 1 out of 2 adults, and up to 1 out of 6 children)  headache (about 1 out of 6 adults and 1 out of 25 children)  loss of appetite (about 1 out of 12 children)  tiredness (about 1 out of 14 adults) If these problems occur, they usually last 1 or 2 days. Severe problems  serious allergic reaction, within a few minutes to a few hours after the shot (very rare). 5. What if there is a serious reaction? What should I look for?  Look for anything that concerns you, such as signs of a severe allergic reaction, very high fever, or behavior changes. Signs of a severe allergic reaction can include hives, swelling of the face and throat, difficulty breathing, a fast heartbeat, dizziness, and weakness. These would start a few minutes to a few hours after the vaccination. What should I do?  If you think it is a severe allergic reaction or other emergency that can't wait, call 9-1-1 or get the person to the nearest hospital. Otherwise, call your  doctor.  Afterward, the reaction should be reported to the Vaccine Adverse Event Reporting System (VAERS). Your doctor might file this report, or you can do it yourself through the VAERS web site at www.vaers.SamedayNews.es, or by calling 417-647-7591. VAERS is only for reporting reactions. They do not give medical advice. 6. The National Vaccine Injury Compensation Program The [employer] Vaccine Injury Compensation Program (VICP) is a federal program that was created to compensate people who may have been injured by certain vaccines. Persons who believe they may have been injured by a vaccine can learn about the program and about filing a claim by calling 858 296 0920 or visiting the [place] website at GoldCloset.com.ee. 7. How can I learn more?  Ask your doctor.  Call your local or state health department.  Contact the Centers for Disease Control and Prevention (CDC):  Call 848-026-2904 (1-800-CDC-INFO) or  Visit CDC's website at: http://hunter.com/ CDC Hepatitis A Vaccine VIS (Interim) (08/13/10)  Document Released: 07/31/2006 Document Revised: 02/20/2014 Document Reviewed: 11/17/2013  ExitCare Patient Information 2015 [place]. This information is not intended to replace advice given to you by your health care provider. Make sure you discuss any questions you have with your health care provider. Hepatitis B Vaccine: What You Need to Know 1. What is hepatitis B? Hepatitis B is a serious infection that affects the liver. It is caused by the hepatitis B virus.   In 2009, about 38,000 people became infected with hepatitis B.  Each year about 2,000 to 4,000 people die in the [place] from cirrhosis or liver cancer caused by hepatitis B. Hepatitis B can cause:  Acute (short-term) illness. This can lead to:  loss of appetite  tiredness  pain in muscles, joints, and stomach  diarrhea and vomiting  jaundice (yellow skin or eyes) Acute illness, with symptoms,  is more common among adults. Children who become infected usually do not have symptoms.  Chronic (long-term) infection. Some people go on to develop chronic hepatitis B infection. Most of them do not have symptoms, but the infection is still very serious, and can lead to:  liver damage (cirrhosis)  liver cancer  death Chronic infection is more common among infants and children than among adults. People who are chronically infected can spread hepatitis B virus to others, even if they don't look or feel sick. Up to 1.4 million people in the [place] may have chronic hepatitis B infection.  Hepatitis B virus is easily spread through contact with the blood or other body fluids of an infected person. People can also be infected from contact with a contaminated object, where the virus can live for up to 7 days.  A baby whose mother is infected can be infected at birth;  Children, adolescents, and adults can become infected by:  contact with blood and body fluids through breaks in the skin such as bites, cuts, or sores;  contact with objects that have blood or body fluids on them such as toothbrushes, razors, or monitoring and treatment devices for diabetes;  having unprotected sex with an infected person;  sharing needles when injecting drugs;  being stuck with a used needle. 2. Hepatitis B vaccine: Why get vaccinated? Hepatitis B vaccine can prevent hepatitis B, and the serious consequences of hepatitis B infection, including liver cancer and cirrhosis. Hepatitis B vaccine may be given by itself or in the same shot with other vaccines. Routine hepatitis B vaccination was recommended for some [place] adults and children beginning in 1982, and for all children in 1991. Since 1990, new hepatitis B infections among children and adolescents have dropped by more than 95%--and by 75% in other age groups. Vaccination gives long-term protection from hepatitis B infection, possibly lifelong. 3. Who  should get hepatitis B vaccine and when? Children and adolescents  Babies normally get 3 doses of hepatitis B vaccine:  1st Dose: Birth  2nd Dose: 54-21 months of age  30rd Dose: 11-37 months of age Some babies might get 4 doses, for example, if a combination vaccine containing hepatitis B is used. (This is a single shot containing several vaccines.) The extra dose is not harmful.  Anyone through 31 years of age who didn't get the vaccine when they were younger should also be vaccinated. Adults  All unvaccinated adults at risk for hepatitis B infection should be vaccinated. This includes:  sex partners of people infected with hepatitis B,  men who have sex with men,  people who inject street drugs,  people with more than one sex partner,  people with chronic liver or kidney disease,  people under 55 years of age with diabetes,  people with jobs that expose them to human blood or other body fluids,  household contacts of people infected with hepatitis B,  residents and staff in institutions for the developmentally disabled,  kidney dialysis patients,  people who travel to countries where hepatitis B is common,  people with HIV infection.  Other people may be encouraged by their doctor to get hepatitis B vaccine; for example, adults 21 and older with diabetes. Anyone else who wants to be protected from hepatitis B infection may get the vaccine.  Pregnant women who are at risk for one of the reasons stated above should be vaccinated. Other pregnant women who want protection may be vaccinated. Adults getting hepatitis B vaccine should get 3 doses--with the second dose given 4 weeks after the first and the third dose 5 months after the second. Your doctor can tell you about other dosing schedules that might be used in certain circumstances. 4. Who should not get hepatitis B vaccine?  Anyone with a life-threatening allergy to yeast, or to any other component of the vaccine,  should not get hepatitis B vaccine. Tell your doctor if you have any severe allergies.  Anyone who has had a life-threatening allergic reaction to a previous dose of hepatitis B vaccine should not get another dose.  Anyone who is moderately or severely ill when a dose of vaccine is scheduled should probably wait until they recover before getting the vaccine. Your doctor can give you more information about these precautions. Note: You might be asked to wait 28 days before donating blood after getting hepatitis B vaccine. This is because the screening test could mistake vaccine in the bloodstream (which is not infectious) for hepatitis B infection. 5. What are the risks from hepatitis B vaccine? Hepatitis B is a very safe vaccine. Most people do not have any problems with it. The vaccine contains non-infectious material, and cannot cause hepatitis B infection. Some mild problems have been reported:  Soreness where the shot was given (up to about 1 person in 4).  Temperature of 99.3F or higher (up to about 1 person in 15). Severe problems are extremely rare. Severe allergic reactions are believed to occur about once in 1.1 million doses. A vaccine, like any medicine, could cause a serious reaction. But the risk of a vaccine causing serious harm, or death, is extremely small. More than 100 million people in the [place] have been vaccinated with hepatitis B vaccine. 6. What if there is a serious reaction? What should I look for?  Look for anything that concerns you, such as signs of a severe allergic reaction, very high fever, or behavior changes. Signs of a severe allergic reaction can include hives, swelling of the face and throat, difficulty breathing, a fast heartbeat, dizziness, and weakness. These would start a few minutes to a few hours after the vaccination. What should I do?  If you think it is a severe allergic reaction or other emergency that can't wait, call 9-1-1 or get the  person to the nearest hospital. Otherwise, call your doctor.  Afterward, the reaction should be reported to the Vaccine Adverse Event Reporting System (VAERS). Your doctor might file this report, or you can do it yourself through the VAERS web site at www.vaers.SamedayNews.es, or by calling (726) 782-8478. VAERS is only for reporting reactions. They do not give medical advice. 7. The [employer] Vaccine Injury [employer] The [employer]  Vaccine Injury Compensation Program (VICP) is a federal program that was created to compensate people who may have been injured by certain vaccines. Persons who believe they may have been injured by a vaccine can learn about the program and about filing a claim by calling 903-078-7525 or visiting the [place] website at GoldCloset.com.ee. 8. How can I learn more?  Ask your doctor.  Call your local or state health department.  Contact the Centers for Disease Control and Prevention (CDC):  Call 678-421-9365 (1-800-CDC-INFO) or  Visit CDC's website at http://hunter.com/ CDC Hepatitis B Interim VIS (11/21/10) Document Released: 07/31/2006 Document Revised: 02/20/2014 Document Reviewed: 11/17/2013 [facility] Patient Information 2015 [place], [place]. This information is not intended to replace advice given to you by your health care provider. Make sure you discuss any questions you have with your health care provider.

## 2014-11-30 NOTE — Progress Notes (Signed)
[facility] [address] [place] [address] [phone]  Date:  11/30/2014   Name:  Mary Wyatt   DOB:  02/16/1986   MRN:  [id number]  PCP:  Marcille Blanco    Chief Complaint: Injections and Vaginal Discharge   History of Present Illness:  Mary Wyatt is a 29 y.o. very pleasant female patient who presents with the following:  She needs to have her final Hep A and B vaccines.   She also has noted some vaginal discharge for maybe 2-3 weeks. She describes a burning feeling in her vulva.  She is not really itching, does have some discharge.   She "always" has abdominal pain but nothing is different than usual.  LMP- she is not sure.  She takes regular OCP but does not menstruate monthly since she switched to a new pill   Patient Active Problem List   Diagnosis Date Noted  . Rhinitis, allergic 04/20/2014  . Generalized anxiety disorder 04/20/2014  . Celiac disease 12/15/2012  . Chest pain 03/04/2012  . Interstitial cystitis     Past Medical History  Diagnosis Date  . Interstitial cystitis   . Chest pain   . Anxiety   . Allergy   . Depression   . Anemia   . Ulcer   . GERD (gastroesophageal reflux disease)   . Genital warts     Past Surgical History  Procedure Laterality Date  . Tonsillectomy    . Ovarian cyst removal      History  Substance Use Topics  . Smoking status: Former Research scientist (life sciences)  . Smokeless tobacco: Never Used  . Alcohol Use: 0.0 oz/week    0-3 drink(s) per week     Comment: Occas.    Family History  Problem Relation Age of Onset  . GI problems Sister   . Asthma Sister   . Heart disease Maternal Grandmother   . Colon cancer Neg Hx     Allergies  Allergen Reactions  . Diflucan [Fluconazole] Hives  . Fluconazole In Dextrose Hives    Medication list has been reviewed and updated.  Current Outpatient Prescriptions on File Prior to Visit  Medication Sig Dispense Refill  . ALPRAZolam (XANAX) 0.5 MG tablet  TAKE ONE TABLET BY MOUTH DAILY AS NEEDED FOR ANXIETY 90 tablet 0  . Norethin Ace-Eth Estrad-FE (LOMEDIA 24 FE PO) Take by mouth.     No current facility-administered medications on file prior to visit.    Review of Systems:  As per HPI- otherwise negative.   Physical Examination: Filed Vitals:   11/30/14 1536  BP: 120/82  Pulse: 71  Temp: 98.2 F (36.8 C)  Resp: 16   Filed Vitals:   11/30/14 1536  Height: 5' 5.25" (1.657 m)  Weight: 130 lb 6.4 oz (59.149 kg)   Body mass index is 21.54 kg/(m^2). Ideal Body Weight: Weight in (lb) to have BMI = 25: 151.1  GEN: WDWN, NAD, Non-toxic, A & O x 3, looks well HEENT: Atraumatic, Normocephalic. Neck supple. No masses, No LAD. Ears and Nose: No external deformity. CV: RRR, No M/G/R. No JVD. No thrill. No extra heart sounds. PULM: CTA B, no wheezes, crackles, rhonchi. No retractions. No resp. distress. No accessory muscle use. ABD: S, NT, ND EXTR: No c/c/e NEURO Normal gait.  PSYCH: Normally interactive. Conversant. Not depressed or anxious appearing.  Calm demeanor.  GU: normal exam- normal vagina, vulva, cervix.  No CMT, no adnexal masses or tenderness.   Results  for orders placed or performed in visit on 11/30/14  POCT UA - Microscopic Only  Result Value Ref Range   WBC, Ur, HPF, POC neg    RBC, urine, microscopic 0-1    Bacteria, U Microscopic neg    Mucus, UA neg    Epithelial cells, urine per micros 0-2    Crystals, Ur, HPF, POC neg    Casts, Ur, LPF, POC neg    Yeast, UA neg   POCT urinalysis dipstick  Result Value Ref Range   Color, UA yellow    Clarity, UA clear    Glucose, UA neg    Bilirubin, UA trace    Ketones, UA neg    Spec Grav, UA 1.025    Blood, UA tarce-lysed    pH, UA 6.0    Protein, UA neg    Urobilinogen, UA 0.2    Nitrite, UA meg    Leukocytes, UA Negative   POCT urine pregnancy  Result Value Ref Range   Preg Test, Ur Negative   POCT Wet Prep with KOH  Result Value Ref Range    Trichomonas, UA Negative    Clue Cells Wet Prep HPF POC 100%    Epithelial Wet Prep HPF POC 2-6    Yeast Wet Prep HPF POC neg    Bacteria Wet Prep HPF POC 2+    RBC Wet Prep HPF POC neg    WBC Wet Prep HPF POC 4-6    KOH Prep POC Negative      Assessment and Plan: BV (bacterial vaginosis) - Plan: metroNIDAZOLE (FLAGYL) 500 MG tablet  Vaginal burning - Plan: POCT UA - Microscopic Only, POCT urinalysis dipstick, POCT urine pregnancy, Urine culture, POCT Wet Prep with KOH, GC/Chlamydia Probe Amp  Immunization due - Plan: Hepatitis A vaccine adult IM, Hepatitis B vaccine adult IM  Given 2nd hep a, 3rd hep b vaccines today Treat for BV with flagyl.  A possible cross- reactivity comes up between her diflucan allergy and flagyl. Discussed with pt in detail- she states that she had hives with diflucan years ago and has definitely taken flagyl since that time without incident.  She will check with pharmD as well but doubt significant risk She will let me know if not feeling better soon, Will plan further follow- up pending labs.  Meds ordered this encounter  Medications  . azelastine (ASTELIN) 0.1 % nasal spray    Sig: Place into both nostrils once. Use in each nostril as directed  . metroNIDAZOLE (FLAGYL) 500 MG tablet    Sig: Take 1 tablet (500 mg total) by mouth 2 (two) times daily. Take 1 pill twice daily for one week. NO alcohol    Dispense:  14 tablet    Refill:  0      Signed Lamar Blinks, MD

## 2014-12-01 LAB — URINE CULTURE
Colony Count: NO GROWTH
Organism ID, Bacteria: NO GROWTH

## 2014-12-01 LAB — GC/CHLAMYDIA PROBE AMP
CT Probe RNA: NEGATIVE
GC PROBE AMP APTIMA: NEGATIVE

## 2014-12-27 ENCOUNTER — Other Ambulatory Visit: Payer: Self-pay | Admitting: Family Medicine

## 2014-12-30 ENCOUNTER — Ambulatory Visit (INDEPENDENT_AMBULATORY_CARE_PROVIDER_SITE_OTHER): Payer: BLUE CROSS/BLUE SHIELD | Admitting: Family Medicine

## 2014-12-30 VITALS — BP 104/70 | HR 68 | Temp 98.2°F | Ht 65.0 in | Wt 129.0 lb

## 2014-12-30 DIAGNOSIS — R5383 Other fatigue: Secondary | ICD-10-CM

## 2014-12-30 DIAGNOSIS — Z7251 High risk heterosexual behavior: Secondary | ICD-10-CM

## 2014-12-30 DIAGNOSIS — G4700 Insomnia, unspecified: Secondary | ICD-10-CM

## 2014-12-30 DIAGNOSIS — F411 Generalized anxiety disorder: Secondary | ICD-10-CM | POA: Diagnosis not present

## 2014-12-30 MED ORDER — ALPRAZOLAM 0.5 MG PO TABS: ORAL_TABLET | ORAL | Status: DC

## 2014-12-30 NOTE — Progress Notes (Addendum)
Subjective:    Patient ID: Mary Wyatt, female    DOB: 1986/07/02, 29 y.o.   MRN: [id number]  12/30/2014  Medication Refill; Anxiety; and Exposure to STD This chart was scribed for Wardell Honour, MD by Martinique Peace, ED Scribe. The patient was seen in [facility]. The patient's care was started at 3:04 PM.   HPI  HPI Comments: Mary Wyatt is a 29 y.o. female who presents to the [facility] seeking medication refill. Pt states that her anxiety is worse than previously. She explains that her job is very stressful and she is now living back home with her parents. She further states she is moving in a couple weeks to the [place] for a 6 month time span with her new job. Pt reports that she has tried taking other medications to address anxiety but has experienced side effects from them. She adds that Xanax seems to be the other thing that works for her. Regarding caffeine consumption, she states she drinks 1 cup a coffee per day and occasionally drinks a cup of tea in the afternoons. No complaints of sadness or depression. No SI.  Not exercising regularly.    Pt also complains that she just had to change her BCP because her insurance no longer covers the generic version she has been taking. She reports new onset of breast tenderness since change in BCP one week ago. She denies any notion that she may be pregnant.   Pt states she is constantly fatigued and not able to sleep at night, which she thinks is partially because her parents always stay up late and keep her up. Last time having thyroid checked was in August 2015. Pt is former smoker.   Pt reports that her celiac disease is "debatable", she explains that she saw 3 GI physicians about issue with 2 reporting she may have it and 1 reporting she may not. She adds that change in her diet has not provided her any relief. Pt also reports that her Interstitial cystitis is also "debatable".  She is also suffering with recurrent BV and has associated  urinary symptoms with BV infections; now that taking boric acid suppositories for BV, urinary symptoms have improved.  Pt would also like testing for STD's performed. She notes she recently took  Hep B Vaccine series.  Recent GC/Chlam probe negative; thus, does not need to repeat today.  Review of Systems  Constitutional: Positive for fatigue. Negative for fever, chills, diaphoresis and unexpected weight change.  Neurological: Negative for dizziness, light-headedness and headaches.  Psychiatric/Behavioral: Positive for sleep disturbance. Negative for suicidal ideas, self-injury and dysphoric mood. The patient is nervous/anxious.     Past Medical History  Diagnosis Date  . Interstitial cystitis   . Chest pain   . Anxiety   . Allergy   . Depression   . Anemia   . Ulcer   . GERD (gastroesophageal reflux disease)   . Genital warts    Past Surgical History  Procedure Laterality Date  . Tonsillectomy    . Ovarian cyst removal     Allergies  Allergen Reactions  . Diflucan [Fluconazole] Hives  . Fluconazole In Dextrose Hives   Current Outpatient Prescriptions  Medication Sig Dispense Refill  . ALPRAZolam (XANAX) 0.5 MG tablet TAKE ONE TABLET BY MOUTH DAILY AS NEEDED FOR ANXIETY 90 tablet 0  . azelastine (ASTELIN) 0.1 % nasal spray Place into both nostrils once. Use in each nostril as directed    . Norethin Ace-Eth Estrad-FE (  LOMEDIA 24 FE PO) Take by mouth.     No current facility-administered medications for this visit.       Objective:    BP 104/70 mmHg  Pulse 68  Temp(Src) 98.2 F (36.8 C) (Oral)  Ht 5' 5"  (1.651 m)  Wt 129 lb (58.514 kg)  BMI 21.47 kg/m2  SpO2 100% Physical Exam  Constitutional: She is oriented to person, place, and time. She appears well-developed and well-nourished. No distress.  HENT:  Head: Normocephalic and atraumatic.  Eyes: Conjunctivae and EOM are normal. Pupils are equal, round, and reactive to light.  Neck: Neck supple. No thyromegaly  present.  Cardiovascular: Normal rate, regular rhythm and normal heart sounds.  Exam reveals no gallop and no friction rub.   No murmur heard. Pulmonary/Chest: Effort normal and breath sounds normal. No respiratory distress. She has no wheezes. She has no rales.  Musculoskeletal: Normal range of motion.  Lymphadenopathy:    She has no cervical adenopathy.  Neurological: She is alert and oriented to person, place, and time.  Skin: Skin is warm and dry. She is not diaphoretic.  Psychiatric: She has a normal mood and affect. Her behavior is normal.  Nursing note and vitals reviewed.  Results for orders placed or performed in visit on 11/30/14  Urine culture  Result Value Ref Range   Colony Count NO GROWTH    Organism ID, Bacteria NO GROWTH   GC/Chlamydia Probe Amp  Result Value Ref Range   CT Probe RNA NEGATIVE    GC Probe RNA NEGATIVE   POCT UA - Microscopic Only  Result Value Ref Range   WBC, Ur, HPF, POC neg    RBC, urine, microscopic 0-1    Bacteria, U Microscopic neg    Mucus, UA neg    Epithelial cells, urine per micros 0-2    Crystals, Ur, HPF, POC neg    Casts, Ur, LPF, POC neg    Yeast, UA neg   POCT urinalysis dipstick  Result Value Ref Range   Color, UA yellow    Clarity, UA clear    Glucose, UA neg    Bilirubin, UA trace    Ketones, UA neg    Spec Grav, UA 1.025    Blood, UA tarce-lysed    pH, UA 6.0    Protein, UA neg    Urobilinogen, UA 0.2    Nitrite, UA meg    Leukocytes, UA Negative   POCT urine pregnancy  Result Value Ref Range   Preg Test, Ur Negative   POCT Wet Prep with KOH  Result Value Ref Range   Trichomonas, UA Negative    Clue Cells Wet Prep HPF POC 100%    Epithelial Wet Prep HPF POC 2-6    Yeast Wet Prep HPF POC neg    Bacteria Wet Prep HPF POC 2+    RBC Wet Prep HPF POC neg    WBC Wet Prep HPF POC 4-6    KOH Prep POC Negative        Assessment & Plan:   1. Other fatigue   2. Generalized anxiety disorder   3. High risk sexual  behavior   4. Insomnia     1. Fatigue: New.  Likely secondary to insomnia from living with parents with different sleep schedule; obtain CBC, CMET, TSH.  Recommend regular exercise. 2.  Generalized anxiety disorder: slight worsening due to job instability; counseling provided; refill of Xanax provided; using 2-3 times per week on average.  Last refill  06/2014.  Recommend regular exercise for stress management.  Psychotherapy likely not an option with current employment. 3.  Insomnia: New. Secondary to living situation and different sleep schedules from parents. 4.  High risk sexual behavior: repeat HIV, RPR, HSV, and hepatitis panels.     Meds ordered this encounter  Medications  . ALPRAZolam (XANAX) 0.5 MG tablet    Sig: TAKE ONE TABLET BY MOUTH DAILY AS NEEDED FOR ANXIETY    Dispense:  90 tablet    Refill:  0    Return in about 6 months (around 07/02/2015) for recheck.    I personally performed the services described in this documentation, which was scribed in my presence. The recorded information has been reviewed and considered.  Raylynn Hersh Elayne Guerin, M.D. [facility] [address] [place], [place]  [phone] phone [phone] fax

## 2014-12-30 NOTE — Patient Instructions (Signed)
Generalized Anxiety Disorder Generalized anxiety disorder (GAD) is a mental disorder. It interferes with life functions, including relationships, work, and school. GAD is different from normal anxiety, which everyone experiences at some point in their lives in response to specific life events and activities. Normal anxiety actually helps us prepare for and get through these life events and activities. Normal anxiety goes away after the event or activity is over.  GAD causes anxiety that is not necessarily related to specific events or activities. It also causes excess anxiety in proportion to specific events or activities. The anxiety associated with GAD is also difficult to control. GAD can vary from mild to severe. People with severe GAD can have intense waves of anxiety with physical symptoms (panic attacks).  SYMPTOMS The anxiety and worry associated with GAD are difficult to control. This anxiety and worry are related to many life events and activities and also occur more days than not for 6 months or longer. People with GAD also have three or more of the following symptoms (one or more in children):  Restlessness.   Fatigue.  Difficulty concentrating.   Irritability.  Muscle tension.  Difficulty sleeping or unsatisfying sleep. DIAGNOSIS GAD is diagnosed through an assessment by your health care provider. Your health care provider will ask you questions aboutyour mood,physical symptoms, and events in your life. Your health care provider may ask you about your medical history and use of alcohol or drugs, including prescription medicines. Your health care provider may also do a physical exam and blood tests. Certain medical conditions and the use of certain substances can cause symptoms similar to those associated with GAD. Your health care provider may refer you to a mental health specialist for further evaluation. TREATMENT The following therapies are usually used to treat GAD:    Medication. Antidepressant medication usually is prescribed for long-term daily control. Antianxiety medicines may be added in severe cases, especially when panic attacks occur.   Talk therapy (psychotherapy). Certain types of talk therapy can be helpful in treating GAD by providing support, education, and guidance. A form of talk therapy called cognitive behavioral therapy can teach you healthy ways to think about and react to daily life events and activities.  Stress managementtechniques. These include yoga, meditation, and exercise and can be very helpful when they are practiced regularly. A mental health specialist can help determine which treatment is best for you. Some people see improvement with one therapy. However, other people require a combination of therapies. Document Released: 01/31/2013 Document Revised: 02/20/2014 Document Reviewed: 01/31/2013 ExitCare Patient Information 2015 [employer]. This information is not intended to replace advice given to you by your health care provider. Make sure you discuss any questions you have with your health care provider.  

## 2014-12-31 LAB — COMPREHENSIVE METABOLIC PANEL
ALK PHOS: 34 U/L — AB (ref 39–117)
ALT: 19 U/L (ref 0–35)
AST: 18 U/L (ref 0–37)
Albumin: 4.6 g/dL (ref 3.5–5.2)
BUN: 8 mg/dL (ref 6–23)
CALCIUM: 9.5 mg/dL (ref 8.4–10.5)
CO2: 23 mEq/L (ref 19–32)
CREATININE: 0.69 mg/dL (ref 0.50–1.10)
Chloride: 104 mEq/L (ref 96–112)
Glucose, Bld: 78 mg/dL (ref 70–99)
POTASSIUM: 4.2 meq/L (ref 3.5–5.3)
Sodium: 137 mEq/L (ref 135–145)
Total Bilirubin: 0.6 mg/dL (ref 0.2–1.2)
Total Protein: 7.3 g/dL (ref 6.0–8.3)

## 2014-12-31 LAB — CBC WITH DIFFERENTIAL/PLATELET
BASOS PCT: 1 % (ref 0–1)
Basophils Absolute: 0 10*3/uL (ref 0.0–0.1)
EOS ABS: 0.1 10*3/uL (ref 0.0–0.7)
EOS PCT: 3 % (ref 0–5)
HCT: 40.7 % (ref 36.0–46.0)
Hemoglobin: 14.1 g/dL (ref 12.0–15.0)
LYMPHS ABS: 1.4 10*3/uL (ref 0.7–4.0)
LYMPHS PCT: 32 % (ref 12–46)
MCH: 31.9 pg (ref 26.0–34.0)
MCHC: 34.6 g/dL (ref 30.0–36.0)
MCV: 92.1 fL (ref 78.0–100.0)
MPV: 10.3 fL (ref 8.6–12.4)
Monocytes Absolute: 0.3 10*3/uL (ref 0.1–1.0)
Monocytes Relative: 7 % (ref 3–12)
NEUTROS ABS: 2.6 10*3/uL (ref 1.7–7.7)
NEUTROS PCT: 57 % (ref 43–77)
Platelets: 266 10*3/uL (ref 150–400)
RBC: 4.42 MIL/uL (ref 3.87–5.11)
RDW: 13 % (ref 11.5–15.5)
WBC: 4.5 10*3/uL (ref 4.0–10.5)

## 2014-12-31 LAB — ACUTE HEP PANEL AND HEP B SURFACE AB
HCV Ab: NEGATIVE
HEP B S AB: POSITIVE — AB
Hep A IgM: NONREACTIVE
Hep B C IgM: NONREACTIVE
Hepatitis B Surface Ag: NEGATIVE

## 2014-12-31 LAB — HIV ANTIBODY (ROUTINE TESTING W REFLEX): HIV: NONREACTIVE

## 2014-12-31 LAB — RPR

## 2015-01-01 LAB — TSH: TSH: 1.38 u[IU]/mL (ref 0.350–4.500)

## 2015-01-01 LAB — HSV(HERPES SIMPLEX VRS) I + II AB-IGG
HSV 1 Glycoprotein G Ab, IgG: <0.1 IV
HSV 2 Glycoprotein G Ab, IgG: <0.1 IV

## 2015-01-15 ENCOUNTER — Ambulatory Visit: Payer: Self-pay | Admitting: Family Medicine

## 2015-01-15 ENCOUNTER — Ambulatory Visit (INDEPENDENT_AMBULATORY_CARE_PROVIDER_SITE_OTHER): Payer: BLUE CROSS/BLUE SHIELD | Admitting: Family Medicine

## 2015-01-15 VITALS — BP 96/66 | HR 76 | Temp 98.1°F | Resp 16 | Ht 65.5 in | Wt 128.6 lb

## 2015-01-15 DIAGNOSIS — N760 Acute vaginitis: Secondary | ICD-10-CM | POA: Diagnosis not present

## 2015-01-15 DIAGNOSIS — R102 Pelvic and perineal pain: Secondary | ICD-10-CM | POA: Diagnosis not present

## 2015-01-15 DIAGNOSIS — N9489 Other specified conditions associated with female genital organs and menstrual cycle: Secondary | ICD-10-CM

## 2015-01-15 DIAGNOSIS — A499 Bacterial infection, unspecified: Secondary | ICD-10-CM | POA: Diagnosis not present

## 2015-01-15 DIAGNOSIS — B9689 Other specified bacterial agents as the cause of diseases classified elsewhere: Secondary | ICD-10-CM

## 2015-01-15 LAB — POCT UA - MICROSCOPIC ONLY
Bacteria, U Microscopic: NEGATIVE
CRYSTALS, UR, HPF, POC: NEGATIVE
Casts, Ur, LPF, POC: NEGATIVE
MUCUS UA: NEGATIVE
YEAST UA: NEGATIVE

## 2015-01-15 LAB — POCT WET PREP WITH KOH
KOH Prep POC: NEGATIVE
Trichomonas, UA: NEGATIVE
Yeast Wet Prep HPF POC: NEGATIVE

## 2015-01-15 LAB — POCT URINALYSIS DIPSTICK
BILIRUBIN UA: NEGATIVE
Glucose, UA: NEGATIVE
KETONES UA: NEGATIVE
LEUKOCYTES UA: NEGATIVE
NITRITE UA: NEGATIVE
Protein, UA: NEGATIVE
Spec Grav, UA: 1.015
Urobilinogen, UA: 0.2
pH, UA: 7

## 2015-01-15 LAB — POCT URINE PREGNANCY: PREG TEST UR: NEGATIVE

## 2015-01-15 MED ORDER — METRONIDAZOLE 500 MG PO TABS: 500.0000 mg | ORAL_TABLET | Freq: Two times a day (BID) | ORAL | Status: DC

## 2015-01-15 NOTE — Patient Instructions (Signed)
You do appear to have BV again- you can go ahead and use the flagyl again or you can wait and see if your symtpoms will clear up I'll let you know if your urine culture is positive for a UTI If we don't talk before you leave, have a wonderful time at the [facility]!

## 2015-01-15 NOTE — Progress Notes (Signed)
[facility] [address] [place] [address] [phone]  Date:  01/15/2015   Name:  Mary Wyatt   DOB:  [birth year]   MRN:  [id number]  PCP:  Lamar Blinks, MD    Chief Complaint: Vaginal Burning and Vaginal Itching   History of Present Illness:  Mary Wyatt is a 29 y.o. very pleasant female patient who presents with the following:  She was here with BV last month.  She has started using boric acid twice a week and had hoped that it was helping.  In February we started her on boric acid as well as flagyl for 7 days.  She felt back to normal but her sx returned about one week ago.  She is not sure if this is the same problem, but she notes some burning in her vulva. She is not having dysuria, no frequency or hematuria.   No unusual vaginal discharge.  She also did have her menses recently. She has not had any recent sexual exposure- last had intercourse about a month prior to her visit in Feb, full STI testing since thenb  She is on OCP- she often skips her menses as they cause pain, but did have a period earlier this month prior to onset of BV sx.  There is a question of endometriosis.  She tends to have chronic pelvic pain that waxes and wanes- a little worse lately but nothing out of the ordinary No fever Patient Active Problem List   Diagnosis Date Noted  . Rhinitis, allergic 04/20/2014  . Generalized anxiety disorder 04/20/2014  . Celiac disease 12/15/2012  . Chest pain 03/04/2012  . Interstitial cystitis     Past Medical History  Diagnosis Date  . Interstitial cystitis   . Chest pain   . Anxiety   . Allergy   . Depression   . Anemia   . Ulcer   . GERD (gastroesophageal reflux disease)   . Genital warts     Past Surgical History  Procedure Laterality Date  . Tonsillectomy    . Ovarian cyst removal      History  Substance Use Topics  . Smoking status: Former Research scientist (life sciences)  . Smokeless tobacco: Never Used  . Alcohol Use: 0.0 -  1.8 oz/week    0-3 Standard drinks or equivalent per week     Comment: Occas.    Family History  Problem Relation Age of Onset  . GI problems Sister   . Asthma Sister   . Heart disease Maternal Grandmother   . Colon cancer Neg Hx     Allergies  Allergen Reactions  . Diflucan [Fluconazole] Hives  . Fluconazole In Dextrose Hives    Medication list has been reviewed and updated.  Current Outpatient Prescriptions on File Prior to Visit  Medication Sig Dispense Refill  . ALPRAZolam (XANAX) 0.5 MG tablet TAKE ONE TABLET BY MOUTH DAILY AS NEEDED FOR ANXIETY 90 tablet 0  . Norethin Ace-Eth Estrad-FE (LOMEDIA 24 FE PO) Take by mouth.    Marland Kitchen azelastine (ASTELIN) 0.1 % nasal spray Place into both nostrils once. Use in each nostril as directed     No current facility-administered medications on file prior to visit.    Review of Systems:  As per HPI- otherwise negative.   Physical Examination: Filed Vitals:   01/15/15 1458  BP: 96/66  Pulse: 76  Temp: 98.1 F (36.7 C)  Resp: 16   Filed Vitals:   01/15/15 1458  Height: 5' 5.5" (  1.664 m)  Weight: 128 lb 9.6 oz (58.333 kg)   Body mass index is 21.07 kg/(m^2). Ideal Body Weight: Weight in (lb) to have BMI = 25: 152.2  GEN: WDWN, NAD, Non-toxic, A & O x 3, looks well and healthy HEENT: Atraumatic, Normocephalic. Neck supple. No masses, No LAD. Ears and Nose: No external deformity. CV: RRR, No M/G/R. No JVD. No thrill. No extra heart sounds. PULM: CTA B, no wheezes, crackles, rhonchi. No retractions. No resp. distress. No accessory muscle use. ABD: S, ND, +BS. No rebound. No HSM. EXTR: No c/c/e NEURO Normal gait.  PSYCH: Normally interactive. Conversant. Not depressed or anxious appearing.  Calm demeanor.  Pelvic: normal, no vaginal lesions or discharge. Uterus normal, no CMT, no adnexal tendereness or masses.  She has mild tenderness over her uterus/ bladder which is baseline for her  Results for orders placed or performed  in visit on 01/15/15  POCT UA - Microscopic Only  Result Value Ref Range   WBC, Ur, HPF, POC 0-1    RBC, urine, microscopic 0-1    Bacteria, U Microscopic neg    Mucus, UA neg    Epithelial cells, urine per micros 0-1    Crystals, Ur, HPF, POC neg    Casts, Ur, LPF, POC neg    Yeast, UA neg   POCT urinalysis dipstick  Result Value Ref Range   Color, UA yellow    Clarity, UA clear    Glucose, UA neg    Bilirubin, UA neg    Ketones, UA neg    Spec Grav, UA 1.015    Blood, UA trace-itnact    pH, UA 7.0    Protein, UA neg    Urobilinogen, UA 0.2    Nitrite, UA neg    Leukocytes, UA Negative   POCT Wet Prep with KOH  Result Value Ref Range   Trichomonas, UA Negative    Clue Cells Wet Prep HPF POC 50%    Epithelial Wet Prep HPF POC 15-18    Yeast Wet Prep HPF POC neg    Bacteria Wet Prep HPF POC 3+    RBC Wet Prep HPF POC 1-3    WBC Wet Prep HPF POC 0-3    KOH Prep POC Negative   POCT urine pregnancy  Result Value Ref Range   Preg Test, Ur Negative     BP Readings from Last 3 Encounters:  01/15/15 96/66  12/30/14 104/70  11/30/14 120/82    Assessment and Plan: Bacterial vaginosis - Plan: metroNIDAZOLE (FLAGYL) 500 MG tablet  Pelvic pain in female - Plan: POCT UA - Microscopic Only, POCT urinalysis dipstick, Urine culture, POCT urine pregnancy  Vulvar burning - Plan: POCT Wet Prep with KOH  Apparent recurrent BV.  She would like to see if her sx will clear up on their own prior to taking flagyl again.  Advised her that this is a reasonable plan, I will check a urine culture and let her know if positive.  She will seek care if getting worse.  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    Signed Lamar Blinks, MD

## 2015-01-17 LAB — URINE CULTURE
Colony Count: NO GROWTH
ORGANISM ID, BACTERIA: NO GROWTH

## 2015-04-13 ENCOUNTER — Ambulatory Visit (INDEPENDENT_AMBULATORY_CARE_PROVIDER_SITE_OTHER): Payer: Federal, State, Local not specified - PPO | Admitting: Family Medicine

## 2015-04-13 ENCOUNTER — Encounter: Payer: Self-pay | Admitting: Family Medicine

## 2015-04-13 VITALS — BP 120/63 | HR 73 | Temp 98.6°F | Resp 16 | Ht 65.5 in | Wt 130.8 lb

## 2015-04-13 DIAGNOSIS — R350 Frequency of micturition: Secondary | ICD-10-CM

## 2015-04-13 DIAGNOSIS — N760 Acute vaginitis: Secondary | ICD-10-CM | POA: Diagnosis not present

## 2015-04-13 DIAGNOSIS — R5383 Other fatigue: Secondary | ICD-10-CM | POA: Diagnosis not present

## 2015-04-13 DIAGNOSIS — B9689 Other specified bacterial agents as the cause of diseases classified elsewhere: Secondary | ICD-10-CM

## 2015-04-13 DIAGNOSIS — Z7251 High risk heterosexual behavior: Secondary | ICD-10-CM | POA: Diagnosis not present

## 2015-04-13 DIAGNOSIS — R590 Localized enlarged lymph nodes: Secondary | ICD-10-CM | POA: Diagnosis not present

## 2015-04-13 DIAGNOSIS — N949 Unspecified condition associated with female genital organs and menstrual cycle: Secondary | ICD-10-CM

## 2015-04-13 DIAGNOSIS — A499 Bacterial infection, unspecified: Secondary | ICD-10-CM | POA: Diagnosis not present

## 2015-04-13 LAB — COMPREHENSIVE METABOLIC PANEL
ALT: 14 U/L (ref 0–35)
AST: 15 U/L (ref 0–37)
Albumin: 4 g/dL (ref 3.5–5.2)
Alkaline Phosphatase: 36 U/L — ABNORMAL LOW (ref 39–117)
BUN: 11 mg/dL (ref 6–23)
CALCIUM: 9 mg/dL (ref 8.4–10.5)
CO2: 24 meq/L (ref 19–32)
CREATININE: 0.63 mg/dL (ref 0.50–1.10)
Chloride: 106 mEq/L (ref 96–112)
GLUCOSE: 85 mg/dL (ref 70–99)
Potassium: 4.2 mEq/L (ref 3.5–5.3)
SODIUM: 137 meq/L (ref 135–145)
TOTAL PROTEIN: 6.6 g/dL (ref 6.0–8.3)
Total Bilirubin: 0.3 mg/dL (ref 0.2–1.2)

## 2015-04-13 LAB — CBC WITH DIFFERENTIAL/PLATELET
BASOS ABS: 0.1 10*3/uL (ref 0.0–0.1)
Basophils Relative: 1 % (ref 0–1)
EOS ABS: 0.2 10*3/uL (ref 0.0–0.7)
Eosinophils Relative: 4 % (ref 0–5)
HCT: 36 % (ref 36.0–46.0)
HEMOGLOBIN: 12.1 g/dL (ref 12.0–15.0)
Lymphocytes Relative: 38 % (ref 12–46)
Lymphs Abs: 2.1 10*3/uL (ref 0.7–4.0)
MCH: 31.7 pg (ref 26.0–34.0)
MCHC: 33.6 g/dL (ref 30.0–36.0)
MCV: 94.2 fL (ref 78.0–100.0)
MPV: 9.6 fL (ref 8.6–12.4)
Monocytes Absolute: 0.3 10*3/uL (ref 0.1–1.0)
Monocytes Relative: 5 % (ref 3–12)
Neutro Abs: 2.9 10*3/uL (ref 1.7–7.7)
Neutrophils Relative %: 52 % (ref 43–77)
PLATELETS: 287 10*3/uL (ref 150–400)
RBC: 3.82 MIL/uL — ABNORMAL LOW (ref 3.87–5.11)
RDW: 12.9 % (ref 11.5–15.5)
WBC: 5.6 10*3/uL (ref 4.0–10.5)

## 2015-04-13 LAB — POCT URINALYSIS DIPSTICK
BILIRUBIN UA: NEGATIVE
Blood, UA: NEGATIVE
GLUCOSE UA: NEGATIVE
KETONES UA: NEGATIVE
Nitrite, UA: NEGATIVE
PROTEIN UA: NEGATIVE
Spec Grav, UA: 1.015
UROBILINOGEN UA: 0.2
pH, UA: 7

## 2015-04-13 LAB — POCT WET PREP WITH KOH
KOH PREP POC: NEGATIVE
RBC Wet Prep HPF POC: NEGATIVE
TRICHOMONAS UA: NEGATIVE
YEAST WET PREP PER HPF POC: NEGATIVE

## 2015-04-13 LAB — POCT UA - MICROSCOPIC ONLY
CRYSTALS, UR, HPF, POC: NEGATIVE
Casts, Ur, LPF, POC: NEGATIVE
EPITHELIAL CELLS, URINE PER MICROSCOPY: NEGATIVE
Mucus, UA: NEGATIVE
RBC, urine, microscopic: NEGATIVE
Yeast, UA: NEGATIVE

## 2015-04-13 MED ORDER — METRONIDAZOLE 500 MG PO TABS: 500.0000 mg | ORAL_TABLET | Freq: Two times a day (BID) | ORAL | Status: DC

## 2015-04-13 MED ORDER — ALPRAZOLAM 0.5 MG PO TABS: ORAL_TABLET | ORAL | Status: DC

## 2015-04-13 NOTE — Progress Notes (Signed)
Subjective:    Patient ID: Mary Wyatt, female    DOB: [birth year], 29 y.o.   MRN: [id number]  04/13/2015  Urinary Frequency and Back Pain   HPI This 29 y.o. female presents for evaluation of urinary frequency and back pain.  Started having symptom of UTI in [place]; ran a culture that showed growth; treated with Cipro for three days. Continued to feel poorly so returned; repeat urine culture was no growth.  Now has returned to [facility], wants to have repeat u/a to confirm resolution.  No fever/chillls/sweats.  Mild nausea but might have been abx; prolonged abx for ten days; treated with Sulfa.  Urine culture + 10,000-25,000 Enterococcus. Still having urinary frequency.  No dysuria.  Only burning without urination; urethral burning.  Some urgency.  Nocturia x 2-3 per night; usually x 1.  No obvious vaginal discharge; has chronic recurrent BV.  No vaginal itching; +vaginal burning.  Hard to differentiate between BV and UTI.  Having B lower back pain R>L mild.  Not sexually active; last sexual activity 08/2014.    Cervical LAD: onset around UTI; on 03/23/15 and 03/30/15; no fever/chills/sweats. No sore throat; no ear pain; no rhinorrhea; +mild nasal congestion; intermittent allergy cough; no SOB.  Cervical LAD is improving.  No teeth pain or issues.  Exposed to a lot of tourists.  No coworkers sick.    Pap smear 06/2014 for repeat pap smear every six months.     Review of Systems  Past Medical History  Diagnosis Date  . Interstitial cystitis   . Chest pain   . Anxiety   . Allergy   . Depression   . Anemia   . Ulcer   . GERD (gastroesophageal reflux disease)   . Genital warts    Past Surgical History  Procedure Laterality Date  . Tonsillectomy    . Ovarian cyst removal     Allergies  Allergen Reactions  . Diflucan [Fluconazole] Hives  . Fluconazole In Dextrose Hives   Current Outpatient Prescriptions  Medication Sig Dispense Refill  . ALPRAZolam (XANAX) 0.5 MG tablet TAKE ONE  TABLET BY MOUTH DAILY AS NEEDED FOR ANXIETY 90 tablet 0  . montelukast (SINGULAIR) 10 MG tablet Take 10 mg by mouth at bedtime.    . Norethin Ace-Eth Estrad-FE (LOMEDIA 24 FE PO) Take by mouth.    . metroNIDAZOLE (FLAGYL) 500 MG tablet Take 1 tablet (500 mg total) by mouth 2 (two) times daily. Take 1 pill twice daily for one week. NO alcohol 14 tablet 0   No current facility-administered medications for this visit.       Objective:    BP 120/63 mmHg  Pulse 73  Temp(Src) 98.6 F (37 C) (Oral)  Resp 16  Ht 5' 5.5" (1.664 m)  Wt 130 lb 12.8 oz (59.33 kg)  BMI 21.43 kg/m2  SpO2 98% Physical Exam Results for orders placed or performed in visit on 04/13/15  GC/Chlamydia Probe Amp  Result Value Ref Range   CT Probe RNA NEGATIVE    GC Probe RNA NEGATIVE   CBC with Differential/Platelet  Result Value Ref Range   WBC 5.6 4.0 - 10.5 K/uL   RBC 3.82 (L) 3.87 - 5.11 MIL/uL   Hemoglobin 12.1 12.0 - 15.0 g/dL   HCT 36.0 36.0 - 46.0 %   MCV 94.2 78.0 - 100.0 fL   MCH 31.7 26.0 - 34.0 pg   MCHC 33.6 30.0 - 36.0 g/dL   RDW 12.9 11.5 - 15.5 %  Platelets 287 150 - 400 K/uL   MPV 9.6 8.6 - 12.4 fL   Neutrophils Relative % 52 43 - 77 %   Neutro Abs 2.9 1.7 - 7.7 K/uL   Lymphocytes Relative 38 12 - 46 %   Lymphs Abs 2.1 0.7 - 4.0 K/uL   Monocytes Relative 5 3 - 12 %   Monocytes Absolute 0.3 0.1 - 1.0 K/uL   Eosinophils Relative 4 0 - 5 %   Eosinophils Absolute 0.2 0.0 - 0.7 K/uL   Basophils Relative 1 0 - 1 %   Basophils Absolute 0.1 0.0 - 0.1 K/uL   Smear Review Criteria for review not met   Comprehensive metabolic panel  Result Value Ref Range   Sodium 137 135 - 145 mEq/L   Potassium 4.2 3.5 - 5.3 mEq/L   Chloride 106 96 - 112 mEq/L   CO2 24 19 - 32 mEq/L   Glucose, Bld 85 70 - 99 mg/dL   BUN 11 6 - 23 mg/dL   Creat 0.63 0.50 - 1.10 mg/dL   Total Bilirubin 0.3 0.2 - 1.2 mg/dL   Alkaline Phosphatase 36 (L) 39 - 117 U/L   AST 15 0 - 37 U/L   ALT 14 0 - 35 U/L   Total Protein  6.6 6.0 - 8.3 g/dL   Albumin 4.0 3.5 - 5.2 g/dL   Calcium 9.0 8.4 - 10.5 mg/dL  TSH  Result Value Ref Range   TSH 1.892 0.350 - 4.500 uIU/mL  HIV antibody  Result Value Ref Range   HIV 1&2 Ab, 4th Generation NONREACTIVE NONREACTIVE  RPR  Result Value Ref Range   RPR Ser Ql NON REAC NON REAC  HSV(herpes simplex vrs) 1+2 ab-IgG  Result Value Ref Range   HSV 1 Glycoprotein G Ab, IgG <0.10 IV   HSV 2 Glycoprotein G Ab, IgG <0.10 IV  Vitamin B12  Result Value Ref Range   Vitamin B-12 291 211 - 911 pg/mL  Vit D  25 hydroxy (rtn osteoporosis monitoring)  Result Value Ref Range   Vit D, 25-Hydroxy 40 30 - 100 ng/mL  POCT urinalysis dipstick  Result Value Ref Range   Color, UA yellow    Clarity, UA clear    Glucose, UA neg    Bilirubin, UA neg    Ketones, UA neg    Spec Grav, UA 1.015    Blood, UA neg    pH, UA 7.0    Protein, UA neg    Urobilinogen, UA 0.2    Nitrite, UA neg    Leukocytes, UA Trace (A) Negative  POCT UA - Microscopic Only  Result Value Ref Range   WBC, Ur, HPF, POC 0-1    RBC, urine, microscopic neg    Bacteria, U Microscopic trace    Mucus, UA neg    Epithelial cells, urine per micros neg    Crystals, Ur, HPF, POC neg    Casts, Ur, LPF, POC neg    Yeast, UA neg   POCT Wet Prep with KOH  Result Value Ref Range   Trichomonas, UA Negative    Clue Cells Wet Prep HPF POC 6-10    Epithelial Wet Prep HPF POC Moderate Few, Moderate, Many   Yeast Wet Prep HPF POC neg    Bacteria Wet Prep HPF POC Many (A) Few   RBC Wet Prep HPF POC neg    WBC Wet Prep HPF POC 3-6    KOH Prep POC Negative  Assessment & Plan:   1. Vaginal burning   2. Other fatigue   3. LAD (lymphadenopathy), cervical   4. Urinary frequency   5. High risk sexual behavior   6. BV (bacterial vaginosis)   7. Bacterial vaginosis     Meds ordered this encounter  Medications  . montelukast (SINGULAIR) 10 MG tablet    Sig: Take 10 mg by mouth at bedtime.  . ALPRAZolam (XANAX) 0.5  MG tablet    Sig: TAKE ONE TABLET BY MOUTH DAILY AS NEEDED FOR ANXIETY    Dispense:  90 tablet    Refill:  0  . metroNIDAZOLE (FLAGYL) 500 MG tablet    Sig: Take 1 tablet (500 mg total) by mouth 2 (two) times daily. Take 1 pill twice daily for one week. NO alcohol    Dispense:  14 tablet    Refill:  0    No Follow-up on file.    Pammie Chirino Elayne Guerin, M.D. [facility] [address] [place], [place]  [phone] phone [phone] fax

## 2015-04-14 LAB — GC/CHLAMYDIA PROBE AMP
CT Probe RNA: NEGATIVE
GC Probe RNA: NEGATIVE

## 2015-04-14 LAB — RPR

## 2015-04-14 LAB — HIV ANTIBODY (ROUTINE TESTING W REFLEX): HIV: NONREACTIVE

## 2015-04-14 LAB — VITAMIN D 25 HYDROXY (VIT D DEFICIENCY, FRACTURES): Vit D, 25-Hydroxy: 40 ng/mL (ref 30–100)

## 2015-04-14 LAB — VITAMIN B12: Vitamin B-12: 291 pg/mL (ref 211–911)

## 2015-04-14 LAB — TSH: TSH: 1.892 u[IU]/mL (ref 0.350–4.500)

## 2015-04-16 LAB — HSV(HERPES SIMPLEX VRS) I + II AB-IGG
HSV 1 Glycoprotein G Ab, IgG: <0.1 IV
HSV 2 Glycoprotein G Ab, IgG: <0.1 IV

## 2015-05-29 ENCOUNTER — Ambulatory Visit (INDEPENDENT_AMBULATORY_CARE_PROVIDER_SITE_OTHER): Payer: Federal, State, Local not specified - PPO | Admitting: Family Medicine

## 2015-05-29 VITALS — BP 118/80 | HR 86 | Temp 98.7°F | Resp 16 | Ht 65.0 in | Wt 130.0 lb

## 2015-05-29 DIAGNOSIS — R1031 Right lower quadrant pain: Secondary | ICD-10-CM

## 2015-05-29 LAB — POCT CBC
GRANULOCYTE PERCENT: 64.6 % (ref 37–80)
HEMATOCRIT: 42.2 % (ref 37.7–47.9)
Hemoglobin: 13.3 g/dL (ref 12.2–16.2)
LYMPH, POC: 1.9 (ref 0.6–3.4)
MCH, POC: 29.9 pg (ref 27–31.2)
MCHC: 31.7 g/dL — AB (ref 31.8–35.4)
MCV: 94.3 fL (ref 80–97)
MID (CBC): 0.3 (ref 0–0.9)
MPV: 7.4 fL (ref 0–99.8)
PLATELET COUNT, POC: 250 10*3/uL (ref 142–424)
POC Granulocyte: 4 (ref 2–6.9)
POC LYMPH %: 30.4 % (ref 10–50)
POC MID %: 5 % (ref 0–12)
RBC: 4.47 M/uL (ref 4.04–5.48)
RDW, POC: 13 %
WBC: 6.2 10*3/uL (ref 4.6–10.2)

## 2015-05-29 LAB — POCT URINALYSIS DIPSTICK
Bilirubin, UA: NEGATIVE
Blood, UA: NEGATIVE
Glucose, UA: NEGATIVE
Ketones, UA: NEGATIVE
Leukocytes, UA: NEGATIVE
Nitrite, UA: NEGATIVE
PH UA: 7.5
Protein, UA: NEGATIVE
Spec Grav, UA: 1.01
Urobilinogen, UA: 0.2

## 2015-05-29 LAB — POCT UA - MICROSCOPIC ONLY
BACTERIA, U MICROSCOPIC: NEGATIVE
CASTS, UR, LPF, POC: NEGATIVE
Crystals, Ur, HPF, POC: NEGATIVE
MUCUS UA: NEGATIVE
RBC, urine, microscopic: NEGATIVE
WBC, UR, HPF, POC: NEGATIVE
Yeast, UA: NEGATIVE

## 2015-05-29 LAB — POCT WET PREP WITH KOH
Clue Cells Wet Prep HPF POC: NEGATIVE
KOH PREP POC: NEGATIVE
RBC WET PREP PER HPF POC: NEGATIVE
Trichomonas, UA: NEGATIVE
YEAST WET PREP PER HPF POC: NEGATIVE

## 2015-05-29 LAB — POCT URINE PREGNANCY: PREG TEST UR: NEGATIVE

## 2015-05-29 MED ORDER — MELOXICAM 7.5 MG PO TABS: 7.5000 mg | ORAL_TABLET | Freq: Every day | ORAL | Status: DC

## 2015-05-29 NOTE — Patient Instructions (Addendum)
I will give you a call with your ultrasound reports asap Use the mobic as needed for pain

## 2015-05-29 NOTE — Progress Notes (Signed)
[facility] [address] [place] [address] [phone]  Date:  05/29/2015   Name:  Mary Wyatt   DOB:  03-22-1986   MRN:  [id number]  PCP:  Lamar Blinks, MD    Chief Complaint: Abdominal Pain; Back Pain; Nausea; Vaginal Pain; and Urinary Frequency   History of Present Illness:  Mary Wyatt is a 29 y.o. very pleasant female patient who presents with the following:  She has a history of celiac disease and IC.  She has also been troubled with abd pain intermittently for years.  She notes a "stabbing pain" in her RLQ which started about 2 weeks ago.  It has traveled lower into her pelvis as the week went on She had a BM earlier today which was painful She has had this sort of pain in her RLQ several times in the past and this has been thought to be due to a cyst/endometriosis. She does still have her appendix.   She has noted nausea but no vomiting She has felt clammy, but did not have a temperature She is able to eat ok   She is trying to drink plenty of water, and has noted some urgency, frequency.  No dysuria, no hematuria.   She has noted some vaginal discomfort, mild discharge.  She is using boric acid vaginal suppositories- she has had a history of frequent and persistent BV.  Most recently treated with flagyl and then using boric acid since June.  Her LMP was in March- she uses her OCP but not continuously.  Often does not menstruate while on her OCP  Negative gonorrhea/ chlamdia in June and no sex since then so she is not concerned about STI   Patient Active Problem List   Diagnosis Date Noted  . Rhinitis, allergic 04/20/2014  . Generalized anxiety disorder 04/20/2014  . Celiac disease 12/15/2012  . Chest pain 03/04/2012  . Interstitial cystitis     Past Medical History  Diagnosis Date  . Interstitial cystitis   . Chest pain   . Anxiety   . Allergy   . Depression   . Anemia   . Ulcer   . GERD (gastroesophageal reflux  disease)   . Genital warts     Past Surgical History  Procedure Laterality Date  . Tonsillectomy    . Ovarian cyst removal      History  Substance Use Topics  . Smoking status: Former Research scientist (life sciences)  . Smokeless tobacco: Never Used  . Alcohol Use: 0.0 - 1.8 oz/week    0-3 Standard drinks or equivalent per week     Comment: Occas.    Family History  Problem Relation Age of Onset  . GI problems Sister   . Asthma Sister   . Heart disease Maternal Grandmother   . Colon cancer Neg Hx   . Cancer Maternal Aunt     breast    Allergies  Allergen Reactions  . Diflucan [Fluconazole] Hives  . Fluconazole In Dextrose Hives    Medication list has been reviewed and updated.  Current Outpatient Prescriptions on File Prior to Visit  Medication Sig Dispense Refill  . ALPRAZolam (XANAX) 0.5 MG tablet TAKE ONE TABLET BY MOUTH DAILY AS NEEDED FOR ANXIETY 90 tablet 0  . montelukast (SINGULAIR) 10 MG tablet Take 10 mg by mouth at bedtime.    . Norethin Ace-Eth Estrad-FE (LOMEDIA 24 FE PO) Take by mouth.     No current facility-administered medications on file prior to visit.  Review of Systems:  As per HPI- otherwise negative.   Physical Examination: Filed Vitals:   05/29/15 1434  BP: 118/80  Pulse: 86  Temp: 98.7 F (37.1 C)  Resp: 16   Filed Vitals:   05/29/15 1434  Height: 5' 5"  (1.651 m)  Weight: 130 lb (58.968 kg)   Body mass index is 21.63 kg/(m^2). Ideal Body Weight: Weight in (lb) to have BMI = 25: 149.9  GEN: WDWN, NAD, Non-toxic, A & O x 3, looks well HEENT: Atraumatic, Normocephalic. Neck supple. No masses, No LAD.  Bilateral TM wnl, oropharynx normal.  PEERL,EOMI.   Ears and Nose: No external deformity. CV: RRR, No M/G/R. No JVD. No thrill. No extra heart sounds. PULM: CTA B, no wheezes, crackles, rhonchi. No retractions. No resp. distress. No accessory muscle use. ABD: S, ND, +BS. No rebound. No HSM.  She has tenderness in her RLQ EXTR: No c/c/e NEURO Normal  gait.  PSYCH: Normally interactive. Conversant. Not depressed or anxious appearing.  Calm demeanor.  Pelvic: normal, no vaginal lesions or discharge. Uterus normal, no CMT, no adnexal tendereness or masses   Results for orders placed or performed in visit on 05/29/15  POCT CBC  Result Value Ref Range   WBC 6.2 4.6 - 10.2 K/uL   Lymph, poc 1.9 0.6 - 3.4   POC LYMPH PERCENT 30.4 10 - 50 %L   MID (cbc) 0.3 0 - 0.9   POC MID % 5.0 0 - 12 %M   POC Granulocyte 4.0 2 - 6.9   Granulocyte percent 64.6 37 - 80 %G   RBC 4.47 4.04 - 5.48 M/uL   Hemoglobin 13.3 12.2 - 16.2 g/dL   HCT, POC 42.2 37.7 - 47.9 %   MCV 94.3 80 - 97 fL   MCH, POC 29.9 27 - 31.2 pg   MCHC 31.7 (A) 31.8 - 35.4 g/dL   RDW, POC 13.0 %   Platelet Count, POC 250 142 - 424 K/uL   MPV 7.4 0 - 99.8 fL  POCT UA - Microscopic Only  Result Value Ref Range   WBC, Ur, HPF, POC neg    RBC, urine, microscopic neg    Bacteria, U Microscopic neg    Mucus, UA neg    Epithelial cells, urine per micros 0-1    Crystals, Ur, HPF, POC neg    Casts, Ur, LPF, POC neg    Yeast, UA neg   POCT urinalysis dipstick  Result Value Ref Range   Color, UA yellow    Clarity, UA clear    Glucose, UA neg    Bilirubin, UA neg    Ketones, UA neg    Spec Grav, UA 1.010    Blood, UA neg    pH, UA 7.5    Protein, UA neg    Urobilinogen, UA 0.2    Nitrite, UA neg    Leukocytes, UA Negative Negative  POCT urine pregnancy  Result Value Ref Range   Preg Test, Ur Negative Negative  POCT Wet Prep with KOH  Result Value Ref Range   Trichomonas, UA Negative    Clue Cells Wet Prep HPF POC neg    Epithelial Wet Prep HPF POC Many Few, Moderate, Many   Yeast Wet Prep HPF POC neg    Bacteria Wet Prep HPF POC Moderate (A) None, Few   RBC Wet Prep HPF POC neg    WBC Wet Prep HPF POC 0-2    KOH Prep POC Negative  Assessment and Plan: RLQ abdominal pain - Plan: POCT CBC, POCT UA - Microscopic Only, POCT urinalysis dipstick, POCT urine pregnancy,  POCT Wet Prep with KOH, Urine culture, US Pelvis Complete, US Abdomen Limited, meloxicam (MOBIC) 7.5 MG tablet  Here today with RLQ pain of about 2 weeks duration.  Her history and labs suggest that this is NOT appendicitis, an ovarian cyst is more likely.  However offered a CT scan today to rule- out appendicitis.  She declines this as her sx remind her of ovarian cyst pain she has had in the past and she prefers to avoid radiation.  She is slim, so suggested that we try and see her appendix with [place].  She agrees with this plan.  Were not able to get pelvic and limited abd [place] today, but scheduled for tomorrow am.  She still declines to do a CT today given that we cannot get her [place] until tomorrow.  She will plan to have her [place] tomorrow and I will be in touch with results asap  Signed Lamar Blinks, MD

## 2015-05-30 ENCOUNTER — Ambulatory Visit: Payer: Self-pay | Admitting: Family Medicine

## 2015-05-30 ENCOUNTER — Ambulatory Visit (HOSPITAL_COMMUNITY)
Admission: RE | Admit: 2015-05-30 | Discharge: 2015-05-30 | Disposition: A | Discharge status: Home / Self Care | Payer: Federal, State, Local not specified - PPO | Source: Ambulatory Visit | Attending: Family Medicine | Admitting: Family Medicine

## 2015-05-30 ENCOUNTER — Other Ambulatory Visit: Payer: Self-pay | Admitting: Family Medicine

## 2015-05-30 ENCOUNTER — Other Ambulatory Visit: Payer: Federal, State, Local not specified - PPO

## 2015-05-30 ENCOUNTER — Telehealth: Payer: Self-pay | Admitting: *Deleted

## 2015-05-30 ENCOUNTER — Ambulatory Visit (INDEPENDENT_AMBULATORY_CARE_PROVIDER_SITE_OTHER): Payer: Federal, State, Local not specified - PPO

## 2015-05-30 ENCOUNTER — Telehealth: Payer: Self-pay | Admitting: Family Medicine

## 2015-05-30 DIAGNOSIS — R1031 Right lower quadrant pain: Secondary | ICD-10-CM

## 2015-05-30 DIAGNOSIS — N9489 Other specified conditions associated with female genital organs and menstrual cycle: Secondary | ICD-10-CM | POA: Diagnosis not present

## 2015-05-30 DIAGNOSIS — N83202 Unspecified ovarian cyst, left side: Secondary | ICD-10-CM

## 2015-05-30 LAB — URINE CULTURE
Colony Count: NO GROWTH
ORGANISM ID, BACTERIA: NO GROWTH

## 2015-05-30 MED ORDER — IOHEXOL 300 MG/ML  SOLN
100.0000 mL | Freq: Once | INTRAMUSCULAR | Status: AC | PRN
  Administered 2015-05-30: 100 mL via INTRAVENOUS

## 2015-05-30 NOTE — Telephone Encounter (Signed)
Called her to go over her recent US imaging. They were not able to visualize her appendix, and the only ovarian cyst seen was on the left.  She endorses continued pain and would like to go ahead with a CT scan at this time. Will order this for her - arranged for today at [facility], she is aware Will call her with results later today

## 2015-05-30 NOTE — Telephone Encounter (Signed)
Received her CT- it is normal IMPRESSION: 1. Possible constipation. 2. Normal appendix, without other explanation for right lower quadrant pain.  Called to let her know- LMOM.  Possible constipation but no other abnormality noted

## 2015-05-30 NOTE — Telephone Encounter (Signed)
Dr. Lorelei Pont this pts results of their CT is in [facility]

## 2015-05-31 NOTE — Telephone Encounter (Signed)
Called again- I have called several times and been unable to reach her.  LMOM- her CT is normal- we do not know the reason for her pain except perhaps constipation.  Please come and see [place] if still having pain.  I have ordered repeat pelvic US for 2 months from now

## 2015-06-06 ENCOUNTER — Encounter: Payer: Self-pay | Admitting: Family Medicine

## 2015-06-06 ENCOUNTER — Ambulatory Visit (INDEPENDENT_AMBULATORY_CARE_PROVIDER_SITE_OTHER): Payer: Federal, State, Local not specified - PPO | Admitting: Family Medicine

## 2015-06-06 VITALS — BP 106/58 | HR 73 | Temp 98.4°F | Resp 16 | Ht 65.0 in | Wt 128.4 lb

## 2015-06-06 DIAGNOSIS — K900 Celiac disease: Secondary | ICD-10-CM

## 2015-06-06 DIAGNOSIS — N809 Endometriosis, unspecified: Secondary | ICD-10-CM

## 2015-06-06 DIAGNOSIS — K5901 Slow transit constipation: Secondary | ICD-10-CM

## 2015-06-06 DIAGNOSIS — R110 Nausea: Secondary | ICD-10-CM

## 2015-06-06 DIAGNOSIS — R1084 Generalized abdominal pain: Secondary | ICD-10-CM | POA: Diagnosis not present

## 2015-06-06 NOTE — Patient Instructions (Addendum)
1. Recommend Colace ( two pills daily) or Miralax (powder in 8 ounces of liquid once or twice daily). CALL IN TWO WEEKS WITH UPDATE.  RECOMMEND EVALUATION BY GYNECOLOGIST IF NO IMPROVEMENT IN TWO TO THREE WEEKS.   About Constipation  Constipation Overview Constipation is the most common gastrointestinal complaint - about 4 million Americans experience constipation and make 2.5 million physician visits a year to get help for the problem.  Constipation can occur when the colon absorbs too much water, the colon's muscle contraction is slow or sluggish, and/or there is delayed transit time through the colon.  The result is stool that is hard and dry.  Indicators of constipation include straining during bowel movements greater than 25% of the time, having fewer than three bowel movements per week, and/or the feeling of incomplete evacuation.  There are established guidelines ([place] II ) for defining constipation. A person needs to have two or more of the following symptoms for at least 12 weeks (not necessarily consecutive) in the preceding 12 months: . Straining in  greater than 25% of bowel movements . Lumpy or hard stools in greater than 25% of bowel movements . Sensation of incomplete emptying in greater than 25% of bowel movements . Sensation of anorectal obstruction/blockade in greater than 25% of bowel movements . Manual maneuvers to help empty greater than 25% of bowel movements (e.g., digital evacuation, support of the pelvic floor)  . Less than  3 bowel movements/week . Loose stools are not present, and criteria for irritable bowel syndrome are insufficient  Common Causes of Constipation . Lack of fiber in your diet . Lack of physical activity . Medications, including iron and calcium supplements  . Dairy intake . Dehydration . Abuse of laxatives  Travel  Irritable Bowel Syndrome  Pregnancy  Luteal phase of menstruation (after ovulation and before menses)  Colorectal  problems  Intestinal Dysfunction  Treating Constipation  There are several ways of treating constipation, including changes to diet and exercise, use of laxatives, adjustments to the pelvic floor, and scheduled toileting.  These treatments include: . increasing fiber and fluids in the diet  . increasing physical activity . learning muscle coordination   learning proper toileting techniques and toileting modifications   designing and sticking  to a toileting schedule     2007, [employer] Doc.22

## 2015-06-06 NOTE — Progress Notes (Signed)
Subjective:    Patient ID: Mary Wyatt, female    DOB: [birth year], 29 y.o.   MRN: [id number]  06/06/2015  Abdominal Pain and Nausea   HPI This 29 y.o. female presents for one week follow-up for RLQ pain.  Stabbing pain on R side; felt just like ovarian cyst in past.  Dr. Lorelei Pont recommended stool softener.  Felt nauseated yesterday; stayed in bed.  Decreased appetite.  No fever/chills; +sweats.  Pain in abdomen shifts; starts near belly button; then changed to RLQ; now has general discomfort in R abdomen; no intense pain.  No vomiting.  +diarrhea yesterday; felt like could not empty; then felt like constipation.  Onset of symptoms started two weeks ago.  Having daily bowel movement; does not feel normal.  Taking stool softener OTC; take 3 daily; not taking 3 daily; taking 1 if at all.  Last sexual activity 08/2014; GC/Chlam negative 03/2015.  Drinks lots of water per day; drinking 3-4 sixteen ounce bottles of water per day. No change in diet.  No recent change in exercise.  No bloody stools; no melena; no heartburn or indigestion.  Possible diagnosis of Celiac disease; first EGD with abnormality; repeat EGD that was WNL.  Certain foods does not make worse.  Eating in general does not make worse.  With really bad stabbing pain, having a bowel movement made it worse for two days.  No dysuria, urgency, frequency, hematuria, worsening nocturia.  No vaginal discharge.  Some vaginal irritation which is chronic. No menses since March.  History of endometriosis.   CT abdomen/pelvis 05/30/2015 revealed constipation; normal appendix. Pelvic US: L ovarian cyst; normal ovary R; normal uterus.   CBC normal; u/a normal. Urine pregnancy negative.  Wet prep negatrive.  Urine culture negative.   Review of Systems  Constitutional: Negative for fever, chills, diaphoresis and fatigue.  Eyes: Negative for visual disturbance.  Respiratory: Negative for cough and shortness of breath.   Cardiovascular: Negative  for chest pain, palpitations and leg swelling.  Gastrointestinal: Positive for nausea, abdominal pain and constipation. Negative for vomiting and diarrhea.  Endocrine: Negative for cold intolerance, heat intolerance, polydipsia, polyphagia and polyuria.  Genitourinary: Positive for menstrual problem and pelvic pain. Negative for dysuria, urgency, frequency, hematuria, flank pain, vaginal discharge and vaginal pain.    Past Medical History  Diagnosis Date  . Interstitial cystitis   . Chest pain   . Anxiety   . Allergy   . Depression   . Anemia   . Ulcer   . GERD (gastroesophageal reflux disease)   . Genital warts    Past Surgical History  Procedure Laterality Date  . Tonsillectomy    . Ovarian cyst removal     Allergies  Allergen Reactions  . Diflucan [Fluconazole] Hives  . Fluconazole In Dextrose Hives   Current Outpatient Prescriptions  Medication Sig Dispense Refill  . ALPRAZolam (XANAX) 0.5 MG tablet TAKE ONE TABLET BY MOUTH DAILY AS NEEDED FOR ANXIETY 90 tablet 0  . meloxicam (MOBIC) 7.5 MG tablet Take 1 tablet (7.5 mg total) by mouth daily. 30 tablet 0  . montelukast (SINGULAIR) 10 MG tablet Take 10 mg by mouth at bedtime.    . Norethin Ace-Eth Estrad-FE (LOMEDIA 24 FE PO) Take by mouth.     No current facility-administered medications for this visit.   Social History   Social History  . Marital Status: Single    Spouse Name: N/A  . Number of Children: 0  . Years of Education: N/A  Occupational History  . [place]   Social History Main Topics  . Smoking status: Former Research scientist (life sciences)  . Smokeless tobacco: Never Used  . Alcohol Use: 0.0 - 1.8 oz/week    0-3 Standard drinks or equivalent per week     Comment: Occas.  . Drug Use: No  . Sexual Activity: Yes    Birth Control/ Protection: Pill   Other Topics Concern  . 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 She appears well-developed and well-nourished. No distress.  HENT:  Head: Normocephalic and atraumatic.  Eyes: Conjunctivae are normal. Pupils are equal, round, and reactive to light.  Neck: Normal range of motion. Neck supple.  Cardiovascular: Normal rate, regular rhythm and normal heart sounds.  Exam reveals no gallop and no friction rub.   No murmur heard. Pulmonary/Chest: Effort normal and breath sounds normal. She has no wheezes. She has no rales.  Abdominal: Soft. Bowel sounds are normal. She exhibits no distension and no mass. There is tenderness in the right upper quadrant, right lower quadrant and suprapubic area. There is no rebound and no guarding.  Neurological: She is alert and oriented to person, place, and time.  Skin: She is not diaphoretic.  Psychiatric: She has a normal mood and affect. Her behavior is normal.  Nursing note and vitals reviewed.  Results for orders placed or performed in visit on 05/29/15  Urine culture  Result Value Ref Range   Colony Count NO GROWTH    Organism ID, Bacteria NO GROWTH   POCT CBC  Result Value Ref Range   WBC 6.2 4.6 - 10.2 K/uL   Lymph, poc 1.9 0.6 - 3.4   POC LYMPH PERCENT 30.4 10 - 50 %L   MID (cbc) 0.3 0 - 0.9   POC MID % 5.0 0 - 12 %M   POC Granulocyte 4.0 2 - 6.9   Granulocyte percent 64.6 37 - 80 %G   RBC 4.47 4.04 - 5.48 M/uL   Hemoglobin 13.3 12.2 - 16.2 g/dL   HCT, POC 42.2 37.7 - 47.9 %   MCV 94.3 80 - 97 fL   MCH, POC 29.9 27 - 31.2 pg   MCHC 31.7 (A) 31.8  - 35.4 g/dL   RDW, POC 13.0 %   Platelet Count, POC 250 142 - 424 K/uL   MPV 7.4 0 - 99.8 fL  POCT UA - Microscopic Only  Result Value Ref Range   WBC, Ur, HPF, POC neg    RBC, urine, microscopic neg    Bacteria, U Microscopic neg    Mucus, UA neg    Epithelial cells, urine per micros 0-1  Crystals, Ur, HPF, POC neg    Casts, Ur, LPF, POC neg    Yeast, UA neg   POCT urinalysis dipstick  Result Value Ref Range   Color, UA yellow    Clarity, UA clear    Glucose, UA neg    Bilirubin, UA neg    Ketones, UA neg    Spec Grav, UA 1.010    Blood, UA neg    pH, UA 7.5    Protein, UA neg    Urobilinogen, UA 0.2    Nitrite, UA neg    Leukocytes, UA Negative Negative  POCT urine pregnancy  Result Value Ref Range   Preg Test, Ur Negative Negative  POCT Wet Prep with KOH  Result Value Ref Range   Trichomonas, UA Negative    Clue Cells Wet Prep HPF POC neg    Epithelial Wet Prep HPF POC Many Few, Moderate, Many   Yeast Wet Prep HPF POC neg    Bacteria Wet Prep HPF POC Moderate (A) None, Few   RBC Wet Prep HPF POC neg    WBC Wet Prep HPF POC 0-2    KOH Prep POC Negative        Assessment & Plan:   1. Generalized abdominal pain   2. Nausea without vomiting   3. Slow transit constipation   4. Endometriosis   5. Celiac disease     1.  Generalized abdominal pain:  Persistent; s/p extensive work up including CT abd/pelvis, urine, urine pregnancy, wet prep, urine culture.  Work up negative other than revealing L OVARIAN CYST and constipation. Treat constipation with scheduled stool softener for two weeks. If no improvement in two weeks, recommend undergoing gynecological evaluation for endometriosis. 2.  Nausea: New.  Intermittent; no vomiting.  S/p CT abd/pelvis.  Benign abdominal exam. Urine pregnancy negative; urine culture negative; u/a negative. 3.  Constipation: New.  Poor compliance with stool softener and taking sporadically; start Colace bid or Miralax daily to bid.   Increase fiber intake; continue aggressive hydration. If no improvement in two weeks, call with update. 4.  Endometriosis: Stable; diagnosed in the past with laparotomy.  Symptoms less consistent with endometriosis at this time; however, if no improvement in two weeks with treatment of constipation, recommend gynecological evaluation. 5. Celiac disease: possible diagnosis in the past; if symptoms persist, recommend follow-up with GI.   No orders of the defined types were placed in this encounter.    No Follow-up on file.    Brendyn Mclaren Elayne Guerin, M.D. [facility] [address] [place], [place]  [address] [phone] phone [phone] fax

## 2015-07-12 ENCOUNTER — Encounter: Payer: Self-pay | Admitting: Family Medicine

## 2015-07-21 ENCOUNTER — Telehealth: Payer: Self-pay | Admitting: Family

## 2015-07-21 DIAGNOSIS — N390 Urinary tract infection, site not specified: Secondary | ICD-10-CM

## 2015-07-21 MED ORDER — SULFAMETHOXAZOLE-TRIMETHOPRIM 800-160 MG PO TABS: 1.0000 | ORAL_TABLET | Freq: Two times a day (BID) | ORAL | Status: DC

## 2015-07-21 NOTE — Progress Notes (Signed)

## 2015-08-02 ENCOUNTER — Ambulatory Visit (INDEPENDENT_AMBULATORY_CARE_PROVIDER_SITE_OTHER): Payer: Federal, State, Local not specified - PPO | Admitting: Family Medicine

## 2015-08-02 ENCOUNTER — Ambulatory Visit (INDEPENDENT_AMBULATORY_CARE_PROVIDER_SITE_OTHER): Payer: Federal, State, Local not specified - PPO

## 2015-08-02 VITALS — BP 108/64 | HR 92 | Temp 98.5°F | Resp 16 | Ht 65.0 in | Wt 130.6 lb

## 2015-08-02 DIAGNOSIS — R0789 Other chest pain: Secondary | ICD-10-CM

## 2015-08-02 DIAGNOSIS — N926 Irregular menstruation, unspecified: Secondary | ICD-10-CM | POA: Diagnosis not present

## 2015-08-02 DIAGNOSIS — N83202 Unspecified ovarian cyst, left side: Secondary | ICD-10-CM | POA: Diagnosis not present

## 2015-08-02 LAB — COMPREHENSIVE METABOLIC PANEL
ALBUMIN: 4.8 g/dL (ref 3.6–5.1)
ALK PHOS: 38 U/L (ref 33–115)
ALT: 16 U/L (ref 6–29)
AST: 17 U/L (ref 10–30)
BILIRUBIN TOTAL: 0.7 mg/dL (ref 0.2–1.2)
BUN: 9 mg/dL (ref 7–25)
CHLORIDE: 103 mmol/L (ref 98–110)
CO2: 25 mmol/L (ref 20–31)
CREATININE: 0.67 mg/dL (ref 0.50–1.10)
Calcium: 9.8 mg/dL (ref 8.6–10.2)
Glucose, Bld: 96 mg/dL (ref 65–99)
Potassium: 3.9 mmol/L (ref 3.5–5.3)
Sodium: 139 mmol/L (ref 135–146)
TOTAL PROTEIN: 7.4 g/dL (ref 6.1–8.1)

## 2015-08-02 LAB — POCT CBC
Granulocyte percent: 59.5 %G (ref 37–80)
HEMATOCRIT: 42 % (ref 37.7–47.9)
Hemoglobin: 14.4 g/dL (ref 12.2–16.2)
Lymph, poc: 1.8 (ref 0.6–3.4)
MCH, POC: 32 pg — AB (ref 27–31.2)
MCHC: 34.2 g/dL (ref 31.8–35.4)
MCV: 93.6 fL (ref 80–97)
MID (cbc): 0.2 (ref 0–0.9)
MPV: 7.3 fL (ref 0–99.8)
POC GRANULOCYTE: 3 (ref 2–6.9)
POC LYMPH %: 35.9 % (ref 10–50)
POC MID %: 4.6 %M (ref 0–12)
Platelet Count, POC: 254 10*3/uL (ref 142–424)
RBC: 4.49 M/uL (ref 4.04–5.48)
RDW, POC: 12.8 %
WBC: 5.1 10*3/uL (ref 4.6–10.2)

## 2015-08-02 LAB — POCT URINE PREGNANCY: Preg Test, Ur: NEGATIVE

## 2015-08-02 MED ORDER — IPRATROPIUM BROMIDE 0.02 % IN SOLN
0.5000 mg | Freq: Once | RESPIRATORY_TRACT | Status: AC
  Administered 2015-08-02: 0.5 mg via RESPIRATORY_TRACT

## 2015-08-02 MED ORDER — ALBUTEROL SULFATE (2.5 MG/3ML) 0.083% IN NEBU
2.5000 mg | INHALATION_SOLUTION | Freq: Once | RESPIRATORY_TRACT | Status: AC
  Administered 2015-08-02: 2.5 mg via RESPIRATORY_TRACT

## 2015-08-02 NOTE — Progress Notes (Signed)
[facility] [address], [place] [address] [phone]  Date:  08/02/2015   Name:  Mary Wyatt   DOB:  03/04/1986   MRN:  [id number]  PCP:  Lamar Blinks, MD    Chief Complaint: Chest Pain and Shortness of Breath   History of Present Illness:  Mary Wyatt is a 29 y.o. very pleasant female patient who presents with the following:  She is here today with illness.  She had recently moved to [place], and soon after she arrived she noted difficulty breathing and chest tightness.  She arrived in [place] about a week ago.  She thought that this could be due to sulfa that she had taken for a UTI, or to exposure to new molds or allergens. 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    She went to [facility] in [place] on 10/5- she was treated with steroid shot, prednisone by mouth, and albuterol.   After about a week she did not feel better- she went to discuss with her boss and got fired.  This was really suprising to her!  She decided just to move back to [place] and thinks this is a blessing in disguise.   She notes that her chest "still feels tight," some cough.  Her cough can wax and wane.   She feels like she is getting tired more easily than usual  She drove to [place]- she drove it all in one 7 day, 7 hour trip.    She has never had a PE or DVT.    She is on OCP.   She does not have a history of asthma but did get an inhaler years ago for some reason.    LMP is uncertain She is taking allegra.  We need to repeat her pelvic US that was done 05/30/2015:IMPRESSION: 1. Septated cystic structure in the left adnexal region measuring 3.2 cm in greatest dimension. A follow-up ultrasound examination in 6-12 weeks is recommended to evaluate this structure for change. 2. The right ovary and the uterus are unremarkable. 3. There is no free pelvic fluid.  Patient Active Problem List   Diagnosis Date Noted  . Rhinitis, allergic 04/20/2014  . Generalized anxiety  disorder 04/20/2014  . Celiac disease 12/15/2012  . Chest pain 03/04/2012  . Interstitial cystitis     Past Medical History  Diagnosis Date  . Interstitial cystitis   . Chest pain   . Anxiety   . Allergy   . Depression   . Anemia   . Ulcer   . GERD (gastroesophageal reflux disease)   . Genital warts     Past Surgical History  Procedure Laterality Date  . Tonsillectomy    . Ovarian cyst removal      Social History  Substance Use Topics  . Smoking status: Former Research scientist (life sciences)  . Smokeless tobacco: Never Used  . Alcohol Use: 0.0 - 1.8 oz/week    0-3 Standard drinks or equivalent per week     Comment: Occas.    Family History  Problem Relation Age of Onset  . GI problems Sister   . Asthma Sister   . Heart disease Maternal Grandmother   . Colon cancer Neg Hx   . Cancer Maternal Aunt     breast    Allergies  Allergen Reactions  . Diflucan [Fluconazole] Hives  . Fluconazole In Dextrose Hives    Medication list has been reviewed and updated.  Current Outpatient Prescriptions on File Prior  to Visit  Medication Sig Dispense Refill  . ALPRAZolam (XANAX) 0.5 MG tablet TAKE ONE TABLET BY MOUTH DAILY AS NEEDED FOR ANXIETY 90 tablet 0  . Norethin Ace-Eth Estrad-FE (LOMEDIA 24 FE PO) Take by mouth.    . montelukast (SINGULAIR) 10 MG tablet Take 10 mg by mouth at bedtime.     No current facility-administered medications on file prior to visit.    Review of Systems:  As per HPI- otherwise negative.   Physical Examination: Filed Vitals:   08/02/15 1526  BP: 108/64  Pulse: 92  Temp: 98.5 F (36.9 C)  Resp: 16   Filed Vitals:   08/02/15 1526  Height: 5' 5"  (1.651 m)  Weight: 130 lb 9.6 oz (59.24 kg)   Body mass index is 21.73 kg/(m^2). Ideal Body Weight: Weight in (lb) to have BMI = 25: 149.9  GEN: WDWN, NAD, Non-toxic, A & O x 3, looks well HEENT: Atraumatic, Normocephalic. Neck supple. No masses, No LAD.  Bilateral TM wnl, oropharynx normal.  PEERL,EOMI.    Ears and Nose: No external deformity. CV: RRR, No M/G/R. No JVD. No thrill. No extra heart sounds. PULM: CTA B, no wheezes, crackles, rhonchi. No retractions. No resp. distress. No accessory muscle use. ABD: S, NT, ND EXTR: No c/c/e NEURO Normal gait.  PSYCH: Normally interactive. Conversant. Not depressed or anxious appearing.  Calm demeanor.  No calf swelling or tenderness  Nebulizer treatment:  Did not change her sx  Results for orders placed or performed in visit on 08/02/15  POCT urine pregnancy  Result Value Ref Range   Preg Test, Ur Negative Negative  POCT CBC  Result Value Ref Range   WBC 5.1 4.6 - 10.2 K/uL   Lymph, poc 1.8 0.6 - 3.4   POC LYMPH PERCENT 35.9 10 - 50 %L   MID (cbc) 0.2 0 - 0.9   POC MID % 4.6 0 - 12 %M   POC Granulocyte 3.0 2 - 6.9   Granulocyte percent 59.5 37 - 80 %G   RBC 4.49 4.04 - 5.48 M/uL   Hemoglobin 14.4 12.2 - 16.2 g/dL   HCT, POC 42.0 37.7 - 47.9 %   MCV 93.6 80 - 97 fL   MCH, POC 32.0 (A) 27 - 31.2 pg   MCHC 34.2 31.8 - 35.4 g/dL   RDW, POC 12.8 %   Platelet Count, POC 254 142 - 424 K/uL   MPV 7.3 0 - 99.8 fL    EKG:  NSR, no ST elevation or depression.  Initial EKG appeared unusual- repeated and was normal, no change from prior  [facility] reading (PRIMARY) by  Dr. Lorelei Pont. CXR: negative   Assessment and Plan: Chest tightness - Plan: albuterol (PROVENTIL) (2.5 MG/3ML) 0.083% nebulizer solution 2.5 mg, ipratropium (ATROVENT) nebulizer solution 0.5 mg, POCT CBC, Comprehensive metabolic panel, D-dimer, quantitative (not at [facility]), EKG 12-Lead, DG Chest 2 View  Irregular menses - Plan: POCT urine pregnancy  Discussed with her in detail.   Most strongly suspect that she is having a reaction to mold or other allergen.  However, with recent travel PE is also a concern.  Discussed how to rule-out PE.  She would like to go ahead with a D dimer, understanding that a positive result will then necessitate a CT angiogram.  Stat blood picked up about  5pm.  Called lab at 10 pm- the blood had been received but was NOT treated as a stat, has not been started yet.  Called pt and LMOM  with this information.  At this point will plan to address in the morning as she had told me she wanted to wait and do the CT then if results positive.    Signed Lamar Blinks, MD

## 2015-08-02 NOTE — Patient Instructions (Signed)
I will call you later today with the results of your D dimer test.

## 2015-08-03 ENCOUNTER — Ambulatory Visit
Admission: RE | Admit: 2015-08-03 | Discharge: 2015-08-03 | Disposition: A | Discharge status: Home / Self Care | Payer: Federal, State, Local not specified - PPO | Source: Ambulatory Visit | Attending: Family Medicine | Admitting: Family Medicine

## 2015-08-03 ENCOUNTER — Other Ambulatory Visit: Payer: Self-pay | Admitting: Family Medicine

## 2015-08-03 DIAGNOSIS — N83202 Unspecified ovarian cyst, left side: Secondary | ICD-10-CM

## 2015-08-03 LAB — D-DIMER, QUANTITATIVE: D-Dimer, Quant: 0.34 ug/mL-FEU (ref 0.00–0.48)

## 2015-08-06 ENCOUNTER — Other Ambulatory Visit: Payer: Self-pay | Admitting: Family Medicine

## 2015-08-06 DIAGNOSIS — N83202 Unspecified ovarian cyst, left side: Secondary | ICD-10-CM

## 2015-08-23 ENCOUNTER — Ambulatory Visit (INDEPENDENT_AMBULATORY_CARE_PROVIDER_SITE_OTHER): Payer: BLUE CROSS/BLUE SHIELD | Admitting: Family Medicine

## 2015-08-23 VITALS — BP 106/68 | HR 78 | Temp 98.6°F | Resp 16 | Ht 65.0 in | Wt 129.8 lb

## 2015-08-23 DIAGNOSIS — J189 Pneumonia, unspecified organism: Secondary | ICD-10-CM

## 2015-08-23 MED ORDER — AZITHROMYCIN 250 MG PO TABS: ORAL_TABLET | ORAL | Status: DC

## 2015-08-23 MED ORDER — CEFDINIR 300 MG PO CAPS: 300.0000 mg | ORAL_CAPSULE | Freq: Two times a day (BID) | ORAL | Status: DC

## 2015-08-23 NOTE — Patient Instructions (Signed)
We are going to treat you with omnicef for walking pneumonia. Let me know if you do not feel better in the next few days- Sooner if worse.   If your tingling and itching persist please let me know

## 2015-08-23 NOTE — Progress Notes (Signed)
[facility] [address] [place] [address] [phone]  Date:  08/23/2015   Name:  Mary Wyatt   DOB:  [birth year]   MRN:  [id number]  PCP:  Lamar Blinks, MD    Chief Complaint: Sore Throat and Fever   History of Present Illness:  Mary Wyatt is a 29 y.o. very pleasant female patient who presents with the following:  She noted onset of ST about 3 weeks ago- she felt better, but then 2 weeks ago sx returned with "cold," chest congestion and cough.  She has tried some mucinex Sneezing started over the last couple of days She also started coughing up some material The St is intermittent She has felt tired, weak, low energy No earache  She also notes some "tingling and deep almost itching in my hands, feet and my legs" which she has noted over the last few days.  Sx are bilateral  She did not take any antipyretics today She has noted a fever at night- she measured up to "amost 102."    No GI symptoms.   LMP is uncertain, but she had a negative HCG at last visit and has not had sex since then, she is also on OCP  Patient Active Problem List   Diagnosis Date Noted  . Rhinitis, allergic 04/20/2014  . Generalized anxiety disorder 04/20/2014  . Celiac disease 12/15/2012  . Chest pain 03/04/2012  . Interstitial cystitis     Past Medical History  Diagnosis Date  . Interstitial cystitis   . Chest pain   . Anxiety   . Allergy   . Depression   . Anemia   . Ulcer   . GERD (gastroesophageal reflux disease)   . Genital warts     Past Surgical History  Procedure Laterality Date  . Tonsillectomy    . Ovarian cyst removal      Social History  Substance Use Topics  . Smoking status: Former Research scientist (life sciences)  . Smokeless tobacco: Never Used  . Alcohol Use: 0.0 - 1.8 oz/week    0-3 Standard drinks or equivalent per week     Comment: Occas.    Family History  Problem Relation Age of Onset  . GI problems Sister   . Asthma Sister   .  Heart disease Maternal Grandmother   . Colon cancer Neg Hx   . Cancer Maternal Aunt     breast    Allergies  Allergen Reactions  . Diflucan [Fluconazole] Hives  . Fluconazole In Dextrose Hives    Medication list has been reviewed and updated.  Current Outpatient Prescriptions on File Prior to Visit  Medication Sig Dispense Refill  . albuterol (PROVENTIL HFA;VENTOLIN HFA) 108 (90 BASE) MCG/ACT inhaler Inhale 1 puff into the lungs every 6 (six) hours as needed for wheezing or shortness of breath.    . ALPRAZolam (XANAX) 0.5 MG tablet TAKE ONE TABLET BY MOUTH DAILY AS NEEDED FOR ANXIETY 90 tablet 0  . Norethin Ace-Eth Estrad-FE (LOMEDIA 24 FE PO) Take by mouth.    . montelukast (SINGULAIR) 10 MG tablet Take 10 mg by mouth at bedtime.     No current facility-administered medications on file prior to visit.    Review of Systems:  As per HPI- otherwise negative.   Physical Examination: Filed Vitals:   08/23/15 1407  BP: 106/68  Pulse: 78  Temp: 98.6 F (37 C)  Resp: 16   Filed Vitals:   08/23/15 1407  Height: 5' 5"  (1.651  m)  Weight: 129 lb 12.8 oz (58.877 kg)   Body mass index is 21.6 kg/(m^2). Ideal Body Weight: Weight in (lb) to have BMI = 25: 149.9  GEN: WDWN, NAD, Non-toxic, A & O x 3, looks well HEENT: Atraumatic, Normocephalic. Neck supple. No masses, No LAD.  Bilateral TM wnl, oropharynx normal.  PEERL,EOMI.   Ears and Nose: No external deformity. CV: RRR, No M/G/R. No JVD. No thrill. No extra heart sounds. PULM: CTA B, no wheezes, crackles. No retractions. No resp. distress. No accessory muscle use. Mild ronchi in her left lower lobe ABD: S, NT, ND. No rebound. No HSM. EXTR: No c/c/e NEURO Normal gait.  PSYCH: Normally interactive. Conversant. Not depressed or anxious appearing.  Calm demeanor.    Assessment and Plan: Walking pneumonia - Plan: azithromycin (ZITHROMAX) 250 MG tablet, DISCONTINUED: cefdinir (OMNICEF) 300 MG capsule  Treat for walking  pneumonia with azithromycin (omnicef was too expensive.  She will let me know if her other sx do not resolve- she is currently without insurance overage and wishes to avoid labs unless absolutely necessary    Signed Lamar Blinks, MD

## 2015-09-03 ENCOUNTER — Ambulatory Visit (INDEPENDENT_AMBULATORY_CARE_PROVIDER_SITE_OTHER): Payer: BLUE CROSS/BLUE SHIELD | Admitting: Family Medicine

## 2015-09-03 VITALS — BP 110/77 | HR 66 | Temp 98.2°F | Resp 18 | Ht 65.0 in | Wt 129.6 lb

## 2015-09-03 DIAGNOSIS — R0789 Other chest pain: Secondary | ICD-10-CM | POA: Diagnosis not present

## 2015-09-03 MED ORDER — PREDNISONE 20 MG PO TABS: ORAL_TABLET | ORAL | Status: DC

## 2015-09-03 NOTE — Progress Notes (Signed)
[facility] [address], [place] [address] [phone]  Date:  09/03/2015   Name:  Mary Wyatt   DOB:  01-Dec-1985   MRN:  [id number]  PCP:  Lamar Blinks, MD    Chief Complaint: Pneumonia   History of Present Illness:  Mary Wyatt is a 29 y.o. very pleasant female patient who presents with the following:  Last seen here on 11/3 with sx of walking pneumonia.  We treated her with azithromycin because omnicef was too expensive for her.  She notes that she "felt well for a few days while I was on the abx."  However she now still "feels run down.  My chest is heavy," she is coughing some still. Sometimes is productive and sometimes not She also has noted some brief episodes of "stabbling" generally right sided CP that go away with rest.   CP is non- exertional No fever She does not cough all night- she is able to sleep ok  We did an evaluation for chest pain about one month ago including a D dimer, CXR and EKG that was all reassuing.   She has not used her albuterol much really- she is not sure if it helps with the chest heaviness   Patient Active Problem List   Diagnosis Date Noted  . Rhinitis, allergic 04/20/2014  . Generalized anxiety disorder 04/20/2014  . Celiac disease 12/15/2012  . Chest pain 03/04/2012  . Interstitial cystitis     Past Medical History  Diagnosis Date  . Interstitial cystitis   . Chest pain   . Anxiety   . Allergy   . Depression   . Anemia   . Ulcer   . GERD (gastroesophageal reflux disease)   . Genital warts     Past Surgical History  Procedure Laterality Date  . Tonsillectomy    . Ovarian cyst removal      Social History  Substance Use Topics  . Smoking status: Former Research scientist (life sciences)  . Smokeless tobacco: Never Used  . Alcohol Use: 0.0 - 1.8 oz/week    0-3 Standard drinks or equivalent per week     Comment: Occas.    Family History  Problem Relation Age of Onset  . GI problems Sister   . Asthma  Sister   . Heart disease Maternal Grandmother   . Colon cancer Neg Hx   . Cancer Maternal Aunt     breast    Allergies  Allergen Reactions  . Diflucan [Fluconazole] Hives    Medication list has been reviewed and updated.  Current Outpatient Prescriptions on File Prior to Visit  Medication Sig Dispense Refill  . albuterol (PROVENTIL HFA;VENTOLIN HFA) 108 (90 BASE) MCG/ACT inhaler Inhale 1 puff into the lungs every 6 (six) hours as needed for wheezing or shortness of breath.    . ALPRAZolam (XANAX) 0.5 MG tablet TAKE ONE TABLET BY MOUTH DAILY AS NEEDED FOR ANXIETY 90 tablet 0  . montelukast (SINGULAIR) 10 MG tablet Take 10 mg by mouth at bedtime.    . Norethin Ace-Eth Estrad-FE (LOMEDIA 24 FE PO) Take by mouth.    Marland Kitchen azithromycin (ZITHROMAX) 250 MG tablet Use as a zpack (Patient not taking: Reported on 09/03/2015) 6 tablet 0   No current facility-administered medications on file prior to visit.    Review of Systems:  As per HPI- otherwise negative.   Physical Examination: Filed Vitals:   09/03/15 1533  BP: 110/77  Pulse: 99  Temp: 98.2 F (36.8 C)  Resp: 18   Filed Vitals:   09/03/15 1533  Height: 5' 5"  (1.651 m)  Weight: 129 lb 9.6 oz (58.786 kg)   Body mass index is 21.57 kg/(m^2). Ideal Body Weight: Weight in (lb) to have BMI = 25: 149.9  GEN: WDWN, NAD, Non-toxic, A & O x 3, looks well and very healthy HEENT: Atraumatic, Normocephalic. Neck supple. No masses, No LAD.  Bilateral TM wnl, oropharynx normal.  PEERL,EOMI.   Ears and Nose: No external deformity. CV: RRR, No M/G/R. No JVD. No thrill. No extra heart sounds. Able to cause CP by pressing on her chest wall PULM: CTA B, no wheezes, crackles, rhonchi. No retractions. No resp. distress. No accessory muscle use. EXTR: No c/c/e NEURO Normal gait.  PSYCH: Normally interactive. Conversant. Not depressed or anxious appearing.  Calm demeanor.    Assessment and Plan: Chest wall pain - Plan: predniSONE  (DELTASONE) 20 MG tablet  Here today for a recheck of sx that have been present for about a month- she was first seen for chest tightness and we ruled out a PE.  She then came back about 10 days ago and was treated with azithromycin for sx suggestive of walking pneumonia. Returns today because she is still feeling tired and notes atypical "stabbing" chest pains mostly in the right side of her chest.  Suspect this is MSK pain due to cough, but did offer to repeat testing such as EKG, CXR, labs (D dimer).  She declines these services for now- we will treat her with prednisone in hopes of relieving painful inflamnation in her chest wall from cough.  She will let me know if not better soon and will seek care if any change or worsening   Signed Lamar Blinks, MD

## 2015-09-03 NOTE — Patient Instructions (Signed)
Let's try a short course of prednisone for your symptoms-  I think this will help to get rid of the cough and other symptoms I do not think your chest pains indicate anything serious, but let me know if it is getting worse or more persistent/

## 2015-10-03 ENCOUNTER — Telehealth: Payer: Self-pay | Admitting: Family Medicine

## 2015-10-17 ENCOUNTER — Other Ambulatory Visit: Payer: Self-pay | Admitting: Family Medicine

## 2015-10-17 MED ORDER — ALPRAZOLAM 0.5 MG PO TABS: ORAL_TABLET | ORAL | Status: DC

## 2015-10-17 NOTE — Telephone Encounter (Signed)
Please call in refill of Xanax as approved.

## 2015-10-17 NOTE — Addendum Note (Signed)
Addended by: Wardell Honour on: 10/17/2015 08:08 PM   Modules accepted: Orders

## 2015-10-18 NOTE — Telephone Encounter (Signed)
Rx called in 

## 2015-10-25 ENCOUNTER — Ambulatory Visit (INDEPENDENT_AMBULATORY_CARE_PROVIDER_SITE_OTHER): Payer: BLUE CROSS/BLUE SHIELD | Admitting: Family Medicine

## 2015-10-25 VITALS — BP 110/82 | HR 94 | Temp 97.9°F | Resp 20 | Wt 131.0 lb

## 2015-10-25 DIAGNOSIS — A499 Bacterial infection, unspecified: Secondary | ICD-10-CM | POA: Diagnosis not present

## 2015-10-25 DIAGNOSIS — B9689 Other specified bacterial agents as the cause of diseases classified elsewhere: Secondary | ICD-10-CM

## 2015-10-25 DIAGNOSIS — F411 Generalized anxiety disorder: Secondary | ICD-10-CM

## 2015-10-25 DIAGNOSIS — N898 Other specified noninflammatory disorders of vagina: Secondary | ICD-10-CM | POA: Diagnosis not present

## 2015-10-25 DIAGNOSIS — N760 Acute vaginitis: Secondary | ICD-10-CM | POA: Diagnosis not present

## 2015-10-25 LAB — POC MICROSCOPIC URINALYSIS (UMFC): MUCUS RE: ABSENT

## 2015-10-25 LAB — POCT URINALYSIS DIP (MANUAL ENTRY)
BILIRUBIN UA: NEGATIVE
GLUCOSE UA: NEGATIVE
LEUKOCYTES UA: NEGATIVE
Nitrite, UA: NEGATIVE
Protein Ur, POC: NEGATIVE
Urobilinogen, UA: 0.2
pH, UA: 5.5

## 2015-10-25 LAB — POCT WET + KOH PREP
Trich by wet prep: ABSENT
YEAST BY KOH: ABSENT
YEAST BY WET PREP: ABSENT

## 2015-10-25 MED ORDER — METRONIDAZOLE 500 MG PO TABS: 500.0000 mg | ORAL_TABLET | Freq: Two times a day (BID) | ORAL | Status: DC

## 2015-10-25 NOTE — Progress Notes (Signed)
Subjective:    Patient ID: Mary Wyatt, female    DOB: 08-06-1986, 30 y.o.   MRN: [id number]  10/25/2015  Vaginitis   HPI This 30 y.o. female presents for evaluation of vaginal burning.  History of chronic BV; using boric acid suppositories twice per week.  Hasp been very effective with boric acid. Not improving. Has had a new partner recently.  Some vaginal discharge; just noticed; white not clumpy.  No odor.  Recent GI bug; no specific fever.  No dysuria; +urgency; not hydrating appropriately.  No n/v/d/c.  GI symptoms resolved five days ago.  Not worried about pregnancy currently; worried about absorbing pill with GI symptoms. Making own boric acid; watched UTube video.  Anxiety: called refill of Xanax last week ;completely ovewhelmed.  Moving around.  Taking Xanax sparingly.  Not using as much for the past week. Usually takes one every other day.  Bought a happy light.  Seasonal affective disorder.  Being unemployed; had moved to [place]; started having respiratory issues due to a building; terminated from job; ended up in [place] by accident.     Review of Systems  Constitutional: Negative for fever, chills, diaphoresis and fatigue.  Eyes: Negative for visual disturbance.  Respiratory: Negative for cough and shortness of breath.   Cardiovascular: Negative for chest pain, palpitations and leg swelling.  Gastrointestinal: Negative for nausea, vomiting, abdominal pain, diarrhea and constipation.  Endocrine: Negative for cold intolerance, heat intolerance, polydipsia, polyphagia and polyuria.  Neurological: Negative for dizziness, tremors, seizures, syncope, facial asymmetry, speech difficulty, weakness, light-headedness, numbness and headaches.    Past Medical History  Diagnosis Date  . Interstitial cystitis   . Chest pain   . Anxiety   . Allergy   . Depression   . Anemia   . Ulcer   . GERD (gastroesophageal reflux disease)   . Genital warts    Past Surgical History    Procedure Laterality Date  . Tonsillectomy    . Ovarian cyst removal     Allergies  Allergen Reactions  . Diflucan [Fluconazole] Hives   Current Outpatient Prescriptions  Medication Sig Dispense Refill  . albuterol (PROVENTIL HFA;VENTOLIN HFA) 108 (90 BASE) MCG/ACT inhaler Inhale 1 puff into the lungs every 6 (six) hours as needed for wheezing or shortness of breath.    . ALPRAZolam (XANAX) 0.5 MG tablet TAKE ONE TABLET BY MOUTH DAILY AS NEEDED FOR ANXIETY 90 tablet 0  . montelukast (SINGULAIR) 10 MG tablet Take 10 mg by mouth at bedtime.    . Norethin Ace-Eth Estrad-FE (LOMEDIA 24 FE PO) Take by mouth.    . predniSONE (DELTASONE) 20 MG tablet Take 2 pills a day for 3 days, then 1 pill a day for 3 days 9 tablet 0  . metroNIDAZOLE (FLAGYL) 500 MG tablet Take 1 tablet (500 mg total) by mouth 2 (two) times daily. 14 tablet 1   No current facility-administered medications for this visit.   Social History   Social History  . Marital Status: Single    Spouse Name: N/A  . Number of Children: 0  . Years of Education: N/A   Occupational History  . [employer]   Social History Main Topics  . Smoking status: Former Research scientist (life sciences)  . Smokeless tobacco: Never Used  . Alcohol Use: 0.0 - 1.8 oz/week    0-3 Standard drinks or equivalent per week     Comment: Occas.  . Drug Use: No  . Sexual  Activity: Yes    Birth Control/ Protection: Pill   Other Topics Concern  . 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 GI problems Sister   . Asthma Sister   . Heart disease Maternal Grandmother   . Colon cancer Neg Hx   . Cancer Maternal  Aunt     breast       Objective:    BP 110/82 mmHg  Pulse 94  Temp(Src) 97.9 F (36.6 C) (Oral)  Resp 20  Wt 131 lb (59.421 kg)  SpO2 99% Physical Exam Results for orders placed or performed in visit on 10/25/15  POCT urinalysis dipstick  Result Value Ref Range   Color, UA yellow yellow   Clarity, UA cloudy (A) clear   Glucose, UA negative negative   Bilirubin, UA negative negative   Ketones, POC UA trace (5) (A) negative   Spec Grav, UA >=1.030    Blood, UA trace-lysed (A) negative   pH, UA 5.5    Protein Ur, POC negative negative   Urobilinogen, UA 0.2    Nitrite, UA Negative Negative   Leukocytes, UA Negative Negative  POCT Microscopic Urinalysis ([facility])  Result Value Ref Range   WBC,UR,HPF,POC Few (A) None WBC/hpf   RBC,UR,HPF,POC Few (A) None RBC/hpf   Bacteria Few (A) None, Too numerous to count   Mucus Absent Absent   Epithelial Cells, UR Per Microscopy Few (A) None, Too numerous to count cells/hpf  POCT Wet + KOH Prep  Result Value Ref Range   Yeast by KOH Absent Present, Absent   Yeast by wet prep Absent Present, Absent   WBC by wet prep None None, Few, Too numerous to count   Clue Cells Wet Prep HPF POC Few (A) None, Too numerous to count   Trich by wet prep Absent Present, Absent   Bacteria Wet Prep HPF POC Moderate (A) None, Few, Too numerous to count   Epithelial Cells By [employer] Pref ([facility]) Many (A) None, Few, Too numerous to count   RBC,UR,HPF,POC None None RBC/hpf       Assessment & Plan:   1. Vaginal irritation   2. Generalized anxiety disorder   3. Bacterial vaginitis     Orders Placed This Encounter  Procedures  . GC/Chlamydia Probe Amp  . Urine culture  . POCT urinalysis dipstick  . POCT Microscopic Urinalysis ([facility])  . POCT Wet + KOH Prep   Meds ordered this encounter  Medications  . metroNIDAZOLE (FLAGYL) 500 MG tablet    Sig: Take 1 tablet (500 mg total) by mouth 2 (two) times daily.    Dispense:  14 tablet    Refill:  1    No  Follow-up on file.    [name] Elayne Guerin, M.D. [facility] [address] [place], [place]  [address] [phone] phone [phone] fax

## 2015-10-27 LAB — GC/CHLAMYDIA PROBE AMP
CT PROBE, AMP APTIMA: NOT DETECTED
GC PROBE AMP APTIMA: NOT DETECTED

## 2015-10-27 LAB — URINE CULTURE
COLONY COUNT: NO GROWTH
Organism ID, Bacteria: NO GROWTH

## 2015-11-09 ENCOUNTER — Encounter: Payer: Self-pay | Admitting: Family Medicine

## 2015-11-14 ENCOUNTER — Encounter: Payer: Self-pay | Admitting: Family Medicine

## 2016-02-25 DIAGNOSIS — J019 Acute sinusitis, unspecified: Secondary | ICD-10-CM | POA: Diagnosis not present

## 2016-02-29 ENCOUNTER — Ambulatory Visit (INDEPENDENT_AMBULATORY_CARE_PROVIDER_SITE_OTHER): Payer: Federal, State, Local not specified - PPO | Admitting: Physician Assistant

## 2016-02-29 VITALS — BP 120/80 | HR 81 | Temp 98.8°F | Resp 16 | Ht 65.5 in | Wt 135.6 lb

## 2016-02-29 DIAGNOSIS — R300 Dysuria: Secondary | ICD-10-CM

## 2016-02-29 LAB — POCT WET + KOH PREP
TRICH BY WET PREP: ABSENT
Yeast by KOH: ABSENT
Yeast by wet prep: ABSENT

## 2016-02-29 LAB — POCT URINALYSIS DIP (MANUAL ENTRY)
BILIRUBIN UA: NEGATIVE
Bilirubin, UA: NEGATIVE
Glucose, UA: NEGATIVE
Leukocytes, UA: NEGATIVE
Nitrite, UA: NEGATIVE
PH UA: 5.5
Protein Ur, POC: NEGATIVE
Spec Grav, UA: 1.025
Urobilinogen, UA: 0.2

## 2016-02-29 LAB — POC MICROSCOPIC URINALYSIS (UMFC): MUCUS RE: ABSENT

## 2016-02-29 LAB — POCT URINE PREGNANCY: PREG TEST UR: NEGATIVE

## 2016-02-29 NOTE — Progress Notes (Signed)
[facility] [address], [place] [address] [phone]  Date:  02/29/2016   Name:  Mary Wyatt   DOB:  14-Mar-1986   MRN:  [id number]  PCP:  Lamar Blinks, MD    Chief Complaint: Urinary Frequency   History of Present Illness:  This is a 30 y.o. female with PMH celiac disease, GAD, interstitial cystitis, allergic rhinitis who is presenting with dysuria x 3-4 days. States usually when she gets these symptoms it ends up being BV and urine is usually clear. She was seen by a urologist about 5 years ago and told she likely has interstitial cystitis but states "he never even touched me to make that diagnosis". States the dysuria feels like it is "in my urethra" rather than on her vulva.   Dysuria: yes Urinary Frequency: mild Vaginal discharge: no Hematuria: no Abdominal pain: no Fever/chills: no Nausea/vominting: no Back pain: no LMP: 02/19/16 Sexually active: yes, with a new partner. Using condoms but does want STD testing todayi Aggravating/Alleviating factors: boric acid but not making a difference Recently took zpak for a sinus infection.   Review of Systems:  Review of Systems See HPI  Patient Active Problem List   Diagnosis Date Noted  . Rhinitis, allergic 04/20/2014  . Generalized anxiety disorder 04/20/2014  . Celiac disease 12/15/2012  . Chest pain 03/04/2012  . Interstitial cystitis     Prior to Admission medications   Medication Sig Start Date End Date Taking? Authorizing Provider  ALPRAZolam Duanne Moron) 0.5 MG tablet TAKE ONE TABLET BY MOUTH DAILY AS NEEDED FOR ANXIETY 10/17/15  Yes Wardell Honour, MD  Norethin Ace-Eth Estrad-FE (LOMEDIA 24 FE PO) Take by mouth.   Yes Historical Provider, MD                  Allergies  Allergen Reactions  . Diflucan [Fluconazole] Hives    Past Surgical History  Procedure Laterality Date  . Tonsillectomy    . Ovarian cyst removal      Social History  Substance Use Topics  . Smoking  status: Former Research scientist (life sciences)  . Smokeless tobacco: Never Used  . Alcohol Use: 0.0 - 1.8 oz/week    0-3 Standard drinks or equivalent per week     Comment: Occas.    Family History  Problem Relation Age of Onset  . GI problems Sister   . Asthma Sister   . Heart disease Maternal Grandmother   . Colon cancer Neg Hx   . Cancer Maternal Aunt     breast    Medication list has been reviewed and updated.  Physical Examination:  Physical Exam  Constitutional: She is oriented to person, place, and time. She appears well-developed and well-nourished. No distress.  HENT:  Head: Normocephalic and atraumatic.  Right Ear: Hearing normal.  Left Ear: Hearing normal.  Nose: Nose normal.  Eyes: Conjunctivae and lids are normal. Right eye exhibits no discharge. Left eye exhibits no discharge. No scleral icterus.  Pulmonary/Chest: Effort normal. No respiratory distress.  Abdominal: Soft. Normal appearance. There is tenderness (just inferior to umbilicus (chronic pain here)). There is no CVA tenderness.  Musculoskeletal: Normal range of motion.  Neurological: She is alert and oriented to person, place, and time.  Skin: Skin is warm, dry and intact. No lesion and no rash noted.  Psychiatric: She has a normal mood and affect. Her speech is normal and behavior is normal. Thought content normal.   BP 120/80 mmHg  Pulse 81  Temp(Src)  98.8 F (37.1 C) (Oral)  Resp 16  Ht 5' 5.5" (1.664 m)  Wt 135 lb 9.6 oz (61.508 kg)  BMI 22.21 kg/m2  SpO2 100%  LMP 02/19/2016  Results for orders placed or performed in visit on 02/29/16  POCT Wet + KOH Prep  Result Value Ref Range   Yeast by KOH Absent Present, Absent   Yeast by wet prep Absent Present, Absent   WBC by wet prep None None, Few, Too numerous to count   Clue Cells Wet Prep HPF POC None None, Too numerous to count   Trich by wet prep Absent Present, Absent   Bacteria Wet Prep HPF POC Moderate (A) None, Few, Too numerous to count   Epithelial Cells  By [employer] ([facility]) Moderate (A) None, Few, Too numerous to count   RBC,UR,HPF,POC None None RBC/hpf  POCT urine pregnancy  Result Value Ref Range   Preg Test, Ur Negative Negative  POCT urinalysis dipstick  Result Value Ref Range   Color, UA yellow yellow   Clarity, UA clear clear   Glucose, UA negative negative   Bilirubin, UA negative negative   Ketones, POC UA negative negative   Spec Grav, UA 1.025    Blood, UA small (A) negative   pH, UA 5.5    Protein Ur, POC negative negative   Urobilinogen, UA 0.2    Nitrite, UA Negative Negative   Leukocytes, UA Negative Negative  POCT Microscopic Urinalysis ([facility])  Result Value Ref Range   WBC,UR,HPF,POC None None WBC/hpf   RBC,UR,HPF,POC None None RBC/hpf   Bacteria Few (A) None, Too numerous to count   Mucus Absent Absent   Epithelial Cells, UR Per Microscopy Moderate (A) None, Too numerous to count cells/hpf    Assessment and Plan:  1. Dysuria Wet prep and UA completely negative. G/C pending. She has been told in the past she might have interstitial cystitis by a urologist but never had any testing. She is not wanting a referral to urologist at this point -- wants to wait and see if this continues to happen. Counseled on hydration, cranberry pills. Return in 1 week if symptoms do not improve or at any time if symptoms worsen.  - POCT Wet + KOH Prep - POCT urine pregnancy - POCT urinalysis dipstick - POCT Microscopic Urinalysis ([facility]) - GC/Chlamydia Probe Amp   Benjaman Pott. Drenda Freeze, MHS [facility]  02/29/2016

## 2016-02-29 NOTE — Patient Instructions (Addendum)
Drink plenty of water (at least 64 oz per day). Daily cranberry pills may help I will call you with lab results If symptoms are not improving in 1 week, return to clinic. If you continue to have these symptoms and negative test results, you should be referred back to urology    IF you received an x-ray today, you will receive an invoice from [facility]. Please contact [facility] at [phone] with questions or concerns regarding your invoice.   IF you received labwork today, you will receive an invoice from [employer]. Please contact [facility] at [phone] with questions or concerns regarding your invoice.   Our billing staff will not be able to assist you with questions regarding bills from these companies.  You will be contacted with the lab results as soon as they are available. The fastest way to get your results is to activate your My Chart account. Instructions are located on the last page of this paperwork. If you have not heard from [place] regarding the results in 2 weeks, please contact this office.

## 2016-03-01 LAB — GC/CHLAMYDIA PROBE AMP
CT Probe RNA: NOT DETECTED
GC PROBE AMP APTIMA: NOT DETECTED

## 2016-04-07 ENCOUNTER — Telehealth: Payer: Self-pay | Admitting: Physician Assistant

## 2016-04-07 ENCOUNTER — Ambulatory Visit (HOSPITAL_COMMUNITY)
Admission: RE | Admit: 2016-04-07 | Discharge: 2016-04-07 | Disposition: A | Discharge status: Home / Self Care | Payer: Federal, State, Local not specified - PPO | Source: Ambulatory Visit | Attending: Physician Assistant | Admitting: Physician Assistant

## 2016-04-07 ENCOUNTER — Ambulatory Visit (INDEPENDENT_AMBULATORY_CARE_PROVIDER_SITE_OTHER): Payer: Federal, State, Local not specified - PPO | Admitting: Physician Assistant

## 2016-04-07 VITALS — BP 108/70 | HR 84 | Temp 98.4°F | Resp 18 | Ht 65.5 in | Wt 142.2 lb

## 2016-04-07 DIAGNOSIS — R102 Pelvic and perineal pain: Secondary | ICD-10-CM

## 2016-04-07 DIAGNOSIS — N898 Other specified noninflammatory disorders of vagina: Secondary | ICD-10-CM | POA: Diagnosis not present

## 2016-04-07 DIAGNOSIS — N926 Irregular menstruation, unspecified: Secondary | ICD-10-CM

## 2016-04-07 DIAGNOSIS — N939 Abnormal uterine and vaginal bleeding, unspecified: Secondary | ICD-10-CM | POA: Diagnosis not present

## 2016-04-07 DIAGNOSIS — R1031 Right lower quadrant pain: Secondary | ICD-10-CM | POA: Diagnosis not present

## 2016-04-07 DIAGNOSIS — N83209 Unspecified ovarian cyst, unspecified side: Secondary | ICD-10-CM | POA: Diagnosis present

## 2016-04-07 LAB — COMPREHENSIVE METABOLIC PANEL
ALK PHOS: 34 U/L (ref 33–115)
ALT: 16 U/L (ref 6–29)
AST: 16 U/L (ref 10–30)
Albumin: 4.4 g/dL (ref 3.6–5.1)
BUN: 11 mg/dL (ref 7–25)
CALCIUM: 9.2 mg/dL (ref 8.6–10.2)
CO2: 26 mmol/L (ref 20–31)
CREATININE: 0.54 mg/dL (ref 0.50–1.10)
Chloride: 105 mmol/L (ref 98–110)
Glucose, Bld: 89 mg/dL (ref 65–99)
Potassium: 4.5 mmol/L (ref 3.5–5.3)
SODIUM: 136 mmol/L (ref 135–146)
TOTAL PROTEIN: 7.2 g/dL (ref 6.1–8.1)
Total Bilirubin: 0.4 mg/dL (ref 0.2–1.2)

## 2016-04-07 LAB — POCT CBC
GRANULOCYTE PERCENT: 54.6 % (ref 37–80)
HCT, POC: 37.6 % — AB (ref 37.7–47.9)
Hemoglobin: 13.6 g/dL (ref 12.2–16.2)
LYMPH, POC: 1.8 (ref 0.6–3.4)
MCH, POC: 32.8 pg — AB (ref 27–31.2)
MCHC: 36.3 g/dL — AB (ref 31.8–35.4)
MCV: 90.3 fL (ref 80–97)
MID (CBC): 0.3 (ref 0–0.9)
MPV: 7.4 fL (ref 0–99.8)
PLATELET COUNT, POC: 211 10*3/uL (ref 142–424)
POC Granulocyte: 2.5 (ref 2–6.9)
POC LYMPH %: 39.1 % (ref 10–50)
POC MID %: 6.3 %M (ref 0–12)
RBC: 4.16 M/uL (ref 4.04–5.48)
RDW, POC: 11.9 %
WBC: 4.6 10*3/uL (ref 4.6–10.2)

## 2016-04-07 LAB — POCT WET + KOH PREP
Trich by wet prep: ABSENT
YEAST BY WET PREP: ABSENT

## 2016-04-07 LAB — HCG, QUANTITATIVE, PREGNANCY: hCG, Beta Chain, Quant, S: <2 m[IU]/mL

## 2016-04-07 LAB — POCT URINE PREGNANCY: PREG TEST UR: NEGATIVE

## 2016-04-07 LAB — TSH: TSH: 3.15 m[IU]/L

## 2016-04-07 NOTE — Progress Notes (Signed)
   Subjective:    Patient ID: Mary Wyatt, female    DOB: September 07, 1986, 30 y.o.   MRN: [id number]  HPI  Patient is a 30 year old female who presents today with 7/10 intermittent, stabbing RLQ pain for the last few weeks.  Admits to increased fatigue; AM nausea, improved with eating; bilateral breast tenderness; and 7-10 lb weight gain over the last month.   Patient has history of ovarian cyst and states "the pain is identical, but I never had the other things with it."    Her last cyst was 05/2015, which resolved on it own by 07/2015.  Ovarian cyst first identified per patient on laproscopy, where it was removed and endometriosis was noted.  LMP - 02/19/2016 - patient has history of irregular cycles, usually has menses every few months.  Currently on Blisovi 24 to help prevent ovarian cyst.  Has OBGYN, but "he is popular and hard to get in with."  Denies current sexual activity, past history of STI, does not believe she is pregnant, home test was negative.  States On my last physical exam here, they she said my ovaries felt enlarged."  Past history of RMSF, 2 summers ago, when working as [employer], now working indoors for [employer].  Review of Systems All others negative except those listed in HPI.     Patient Active Problem List   Diagnosis Date Noted  . Rhinitis, allergic 04/20/2014  . Generalized anxiety disorder 04/20/2014  . Celiac disease 12/15/2012  . Chest pain 03/04/2012  . Interstitial cystitis    Current Outpatient Prescriptions on File Prior to Visit  Medication Sig Dispense Refill  . ALPRAZolam (XANAX) 0.5 MG tablet TAKE ONE TABLET BY MOUTH DAILY AS NEEDED FOR ANXIETY 90 tablet 0   No current facility-administered medications on file prior to visit.   Allergies  Allergen Reactions  . Diflucan [Fluconazole] Hives    Objective:   Physical Exam  Constitutional: She appears well-developed and well-nourished.  Neck: Normal range of motion. Neck supple. No  thyromegaly present.  Cardiovascular: Normal rate, regular rhythm, normal heart sounds and intact distal pulses.   Pulmonary/Chest: Effort normal and breath sounds normal.  Abdominal: There is tenderness in the right lower quadrant and suprapubic area. There is no rigidity, no guarding, no tenderness at McBurney's point and negative Murphy's sign.  Genitourinary: Uterus normal. Uterus is not enlarged and not tender. Cervix exhibits discharge. Cervix exhibits no motion tenderness. Right adnexum displays no mass and no tenderness. Left adnexum displays no mass and no tenderness. No tenderness in the vagina. Vaginal discharge found.        Assessment & Plan:  1. Menstrual periods irregular Chronically irregular menses.  Urine pregnancy test negative, hCG quant ordered, and pending, to rule out early or ectopic pregnancy. - POCT urine pregnancy - hCG, quantitative, pregnancy - US Transvaginal Non-OB; Future  2. Pelvic pain in female Etiology unknown, labs pending.  CBC indicates no infection present.  Pelvic and transvaginal US ordered and scheduled to help identify etiology and rule out emergencies. - TSH - POCT CBC - Comprehensive metabolic panel - US Pelvis Complete; Future - US Transvaginal Non-OB; Future  3. Vaginal discharge Yeast present on KOH but not wet prep, likely due to normal vaginal discharge.  If patient develops symptoms, will treat for yeast infection. - POCT Wet + KOH Prep - US Transvaginal Non-OB; Future  [place] P. Aaniya Sterba, PA-S

## 2016-04-07 NOTE — Progress Notes (Signed)
Patient ID: JENAI SCALETTA, female    DOB: 13-Oct-1986, 30 y.o.   MRN: [id number]  PCP: Lamar Blinks, MD  Subjective:   Chief Complaint  Patient presents with  . Right Sided Pelvic Pain    x2-3 days  . Breast Pain    breast tenderness x1 week  . Fatigue    couple of weeks  . Weight Gain    HPI Presents for evaluation of RLQ pelvic pain, fatigue, nausea and breast tenderness x about 1 month. Also weight gain that seemed to happen "overnight."  Home pregnancy test was NEGATIVE. She last had sex more than a month ago, before her LMP 02/29/2016, which was normal for her. Uses COC for contraception.  History of bilateral ovarian cysts, excision on the RIGHT. Is "used to" some pain which she associates with ovarian cysts each month, but this is worse and the accompanying symptoms are not typical for her.  She lives and works on the [place] for the [employer]. She came home this weekend to celebrate Father's Day with her parents, and [place]   Review of Systems No fever, chills. Some nausea. No diarrhea, constipation. No urinary symptoms. No headache.    Patient Active Problem List   Diagnosis Date Noted  . Rhinitis, allergic 04/20/2014  . Generalized anxiety disorder 04/20/2014  . Celiac disease 12/15/2012  . Chest pain 03/04/2012  . Interstitial cystitis      Prior to Admission medications   Medication Sig Start Date End Date Taking? Authorizing Provider  ALPRAZolam Duanne Moron) 0.5 MG tablet TAKE ONE TABLET BY MOUTH DAILY AS NEEDED FOR ANXIETY 10/17/15  Yes Wardell Honour, MD  BLISOVI 24 FE 1-20 MG-MCG(24) tablet Take 1 tablet by mouth daily. 03/19/16   Historical Provider, MD     Allergies  Allergen Reactions  . Diflucan [Fluconazole] Hives       Objective:  Physical Exam  Constitutional: She is oriented to person, place, and time. She appears well-developed and well-nourished. She is active and  cooperative. No distress.  BP 108/70 mmHg  Pulse 84  Temp(Src) 98.4 F (36.9 C) (Oral)  Resp 18  Ht 5' 5.5" (1.664 m)  Wt 142 lb 3.2 oz (64.501 kg)  BMI 23.29 kg/m2  SpO2 100%  LMP 02/29/2016  HENT:  Head: Normocephalic and atraumatic.  Right Ear: Hearing normal.  Left Ear: Hearing normal.  Eyes: Conjunctivae are normal. No scleral icterus.  Neck: Normal range of motion. Neck supple. No thyromegaly present.  Cardiovascular: Normal rate, regular rhythm and normal heart sounds.   Pulses:      Radial pulses are 2+ on the right side, and 2+ on the left side.  Pulmonary/Chest: Effort normal and breath sounds normal.  Abdominal: Normal appearance and bowel sounds are normal. She exhibits no mass. There is no hepatosplenomegaly. There is tenderness in the right lower quadrant. There is no rigidity, no rebound, no guarding, no tenderness at McBurney's point and negative Murphy's sign. Hernia confirmed negative in the right inguinal area and confirmed negative in the left inguinal area.  Genitourinary: Uterus normal. Pelvic exam was performed with patient supine. No labial fusion. There is no rash, tenderness, lesion or injury on the right labia. There is no rash, tenderness, lesion or injury on the left labia. Cervix exhibits no motion tenderness, no discharge and no friability. Right adnexum displays tenderness (mild). Right adnexum displays no mass and no fullness. Left adnexum displays no mass, no tenderness  and no fullness. No erythema, tenderness or bleeding in the vagina. No foreign body around the vagina. No signs of injury around the vagina. Vaginal discharge found.  Lymphadenopathy:       Head (right side): No tonsillar, no preauricular, no posterior auricular and no occipital adenopathy present.       Head (left side): No tonsillar, no preauricular, no posterior auricular and no occipital adenopathy present.    She has no cervical adenopathy.       Right: No inguinal and no  supraclavicular adenopathy present.       Left: No inguinal and no supraclavicular adenopathy present.  Neurological: She is alert and oriented to person, place, and time. No sensory deficit.  Skin: Skin is warm, dry and intact. No rash noted. No cyanosis or erythema. Nails show no clubbing.  Psychiatric: She has a normal mood and affect. Her speech is normal and behavior is normal.    Wt Readings from Last 3 Encounters:  04/07/16 142 lb 3.2 oz (64.501 kg)  02/29/16 135 lb 9.6 oz (61.508 kg)  10/25/15 131 lb (59.421 kg)      Results for orders placed or performed in visit on 04/07/16  POCT urine pregnancy  Result Value Ref Range   Preg Test, Ur Negative Negative  POCT CBC  Result Value Ref Range   WBC 4.6 4.6 - 10.2 K/uL   Lymph, poc 1.8 0.6 - 3.4   POC LYMPH PERCENT 39.1 10 - 50 %L   MID (cbc) 0.3 0 - 0.9   POC MID % 6.3 0 - 12 %M   POC Granulocyte 2.5 2 - 6.9   Granulocyte percent 54.6 37 - 80 %G   RBC 4.16 4.04 - 5.48 M/uL   Hemoglobin 13.6 12.2 - 16.2 g/dL   HCT, POC 37.6 (A) 37.7 - 47.9 %   MCV 90.3 80 - 97 fL   MCH, POC 32.8 (A) 27 - 31.2 pg   MCHC 36.3 (A) 31.8 - 35.4 g/dL   RDW, POC 11.9 %   Platelet Count, POC 211 142 - 424 K/uL   MPV 7.4 0 - 99.8 fL  POCT Wet + KOH Prep  Result Value Ref Range   Yeast by KOH Present Present, Absent   Yeast by wet prep Absent Present, Absent   WBC by wet prep Few None, Few, Too numerous to count   Clue Cells Wet Prep HPF POC None None, Too numerous to count   Trich by wet prep Absent Present, Absent   Bacteria Wet Prep HPF POC Moderate (A) None, Few, Too numerous to count   Epithelial Cells By [employer] ([facility]) Few None, Few, Too numerous to count   RBC,UR,HPF,POC None None RBC/hpf       Assessment & Plan:   1. Menstrual periods irregular 2. Pelvic pain in female 3. Vaginal discharge Pelvic US today. Some concern for ectopic pregnancy, despite negative UCG. Serum Quantitative HCG is pending. Yeast is present on wet prep,  but there are few WBCs and she is asymptomatic, so unlikely to represent a yeast infection. - POCT urine pregnancy - hCG, quantitative, pregnancy - TSH - POCT CBC - Comprehensive metabolic panel - US Pelvis Complete; Future - POCT Wet + KOH Prep    Fara Chute, PA-C Physician Assistant-Certified [facility]

## 2016-04-07 NOTE — Patient Instructions (Addendum)
Your appointment time is 3:30 at [facility].  Please arrive 30 minutes early to register.  Please have a full bladder when you arrive.    IF you received an x-ray today, you will receive an invoice from [facility]. Please contact [facility] at [phone] with questions or concerns regarding your invoice.   IF you received labwork today, you will receive an invoice from [employer]. Please contact [facility] at [phone] with questions or concerns regarding your invoice.   Our billing staff will not be able to assist you with questions regarding bills from these companies.  You will be contacted with the lab results as soon as they are available. The fastest way to get your results is to activate your My Chart account. Instructions are located on the last page of this paperwork. If you have not heard from [place] regarding the results in 2 weeks, please contact this office.

## 2016-04-07 NOTE — Telephone Encounter (Signed)
Left message. No explanation for her symptoms. Awaiting lab results.

## 2016-04-09 ENCOUNTER — Encounter: Payer: Self-pay | Admitting: Physician Assistant

## 2016-05-16 DIAGNOSIS — Z1159 Encounter for screening for other viral diseases: Secondary | ICD-10-CM | POA: Diagnosis not present

## 2016-05-16 DIAGNOSIS — N760 Acute vaginitis: Secondary | ICD-10-CM | POA: Diagnosis not present

## 2016-05-16 DIAGNOSIS — Z01419 Encounter for gynecological examination (general) (routine) without abnormal findings: Secondary | ICD-10-CM | POA: Diagnosis not present

## 2016-05-16 DIAGNOSIS — Z113 Encounter for screening for infections with a predominantly sexual mode of transmission: Secondary | ICD-10-CM | POA: Diagnosis not present

## 2016-05-16 DIAGNOSIS — Z114 Encounter for screening for human immunodeficiency virus [HIV]: Secondary | ICD-10-CM | POA: Diagnosis not present

## 2016-05-16 DIAGNOSIS — Z01411 Encounter for gynecological examination (general) (routine) with abnormal findings: Secondary | ICD-10-CM | POA: Diagnosis not present

## 2016-05-16 DIAGNOSIS — Z6823 Body mass index (BMI) 23.0-23.9, adult: Secondary | ICD-10-CM | POA: Diagnosis not present

## 2016-05-16 DIAGNOSIS — N390 Urinary tract infection, site not specified: Secondary | ICD-10-CM | POA: Diagnosis not present

## 2016-06-27 DIAGNOSIS — J019 Acute sinusitis, unspecified: Secondary | ICD-10-CM | POA: Diagnosis not present

## 2016-06-27 DIAGNOSIS — N390 Urinary tract infection, site not specified: Secondary | ICD-10-CM | POA: Diagnosis not present

## 2016-07-29 ENCOUNTER — Ambulatory Visit (INDEPENDENT_AMBULATORY_CARE_PROVIDER_SITE_OTHER): Payer: Federal, State, Local not specified - PPO | Admitting: Physician Assistant

## 2016-07-29 VITALS — BP 122/74 | HR 92 | Temp 98.6°F | Resp 17 | Ht 65.5 in | Wt 148.0 lb

## 2016-07-29 DIAGNOSIS — R591 Generalized enlarged lymph nodes: Secondary | ICD-10-CM

## 2016-07-29 DIAGNOSIS — M791 Myalgia, unspecified site: Secondary | ICD-10-CM

## 2016-07-29 DIAGNOSIS — Z113 Encounter for screening for infections with a predominantly sexual mode of transmission: Secondary | ICD-10-CM

## 2016-07-29 DIAGNOSIS — J029 Acute pharyngitis, unspecified: Secondary | ICD-10-CM

## 2016-07-29 DIAGNOSIS — R635 Abnormal weight gain: Secondary | ICD-10-CM

## 2016-07-29 LAB — POCT CBC
Granulocyte percent: 71.7 %G (ref 37–80)
HCT, POC: 36.4 % — AB (ref 37.7–47.9)
HEMOGLOBIN: 13 g/dL (ref 12.2–16.2)
LYMPH, POC: 1.4 (ref 0.6–3.4)
MCH, POC: 32.6 pg — AB (ref 27–31.2)
MCHC: 35.8 g/dL — AB (ref 31.8–35.4)
MCV: 91.2 fL (ref 80–97)
MID (cbc): 0.4 (ref 0–0.9)
MPV: 7.2 fL (ref 0–99.8)
PLATELET COUNT, POC: 230 10*3/uL (ref 142–424)
POC GRANULOCYTE: 4.7 (ref 2–6.9)
POC LYMPH PERCENT: 21.7 %L (ref 10–50)
POC MID %: 6.6 %M (ref 0–12)
RBC: 3.99 M/uL — AB (ref 4.04–5.48)
RDW, POC: 11.8 %
WBC: 6.6 10*3/uL (ref 4.6–10.2)

## 2016-07-29 LAB — POCT INFLUENZA A/B
Influenza A, POC: NEGATIVE
Influenza B, POC: NEGATIVE

## 2016-07-29 LAB — TSH: TSH: 2.92 mIU/L

## 2016-07-29 LAB — HIV ANTIBODY (ROUTINE TESTING W REFLEX): HIV 1&2 Ab, 4th Generation: NONREACTIVE

## 2016-07-29 LAB — POCT RAPID STREP A (OFFICE): Rapid Strep A Screen: NEGATIVE

## 2016-07-29 MED ORDER — FLUTICASONE PROPIONATE 50 MCG/ACT NA SUSP: 2.0000 | Freq: Every day | NASAL | 0 refills | Status: DC

## 2016-07-29 NOTE — Progress Notes (Signed)
Mary Wyatt  MRN: [id number] DOB: [birth year]  Subjective:  Mary Wyatt is a 30 y.o. female seen in office today for a chief complaint of sore throat and lymphadenopathy x 1 week. Has associated headache, body aches, and cough x 3 days. Notes that the people at her office have strep right now. Pt has only tried cough drops for cough and BC powder for body aches.   Of note, pt has been having intermittent lymphadenopathy x 1 month. Pt has had mono in 2001 but is requesting to be checked for it today.   Also having weight gain x 3 months. Pt was 131 lbs in 10/2014; today she is 148 lbs. Pt notes she is eating better than ever. Diet consists of three meals and one snack daily. Eats a variety of meats and vegetables. Drinks mostly water. Pt does not engage in structured exercise. Denies xeroderma and brittle nails.  Review of Systems  Constitutional: Positive for chills, fatigue and fever (subjective). Negative for diaphoresis.  HENT: Positive for ear pain and sinus pressure. Negative for congestion.   Eyes: Negative for pain and itching.  Respiratory: Negative for shortness of breath and wheezing.   Gastrointestinal: Negative for abdominal pain, nausea and vomiting.  Endocrine: Positive for cold intolerance.    Patient Active Problem List   Diagnosis Date Noted  . Rhinitis, allergic 04/20/2014  . Generalized anxiety disorder 04/20/2014  . Celiac disease 12/15/2012  . Chest pain 03/04/2012  . Interstitial cystitis     Current Outpatient Prescriptions on File Prior to Visit  Medication Sig Dispense Refill  . ALPRAZolam (XANAX) 0.5 MG tablet TAKE ONE TABLET BY MOUTH DAILY AS NEEDED FOR ANXIETY 90 tablet 0  . BLISOVI 24 FE 1-20 MG-MCG(24) tablet Take 1 tablet by mouth daily.  1   No current facility-administered medications on file prior to visit.     Allergies  Allergen Reactions  . Diflucan [Fluconazole] Hives   Social History   Social History  . Marital status:  Single    Spouse name: N/A  . Number of children: 0  . Years of education: N/A   Occupational History  . [employer]   Social History Main Topics  . Smoking status: Former Research scientist (life sciences)  . Smokeless tobacco: Never Used  . Alcohol use 0.0 - 1.8 oz/week     Comment: Occas.  . Drug use: No  . Sexual activity: Yes    Birth control/ protection: Pill   Other Topics Concern  . Not on file   Social History Narrative      Marital status:  Single; parents in [place]: none      Employment: [place] [employer] works indoors       Tobacco: none      Alcohol: socially; twice weekly      Drugs: none      Exercise: sporadic; walking sometimes         Daily caffeine     Objective:  BP 122/74 (BP Location: Right Arm, Patient Position: Sitting, Cuff Size: Normal)   Pulse 92   Temp 98.6 F (37 C) (Oral)   Resp 17   Ht 5' 5.5" (1.664 m)   Wt 148 lb (67.1 kg)   LMP 07/22/2016 (Approximate)   SpO2 97%   BMI 24.25 kg/m   Physical Exam  Constitutional: She is oriented to person, place, and time. She appears distressed (mild distress).  Well developed, well nourished  HENT:  Head: Normocephalic and atraumatic.  Right Ear: Tympanic membrane, external ear and ear canal normal.  Left Ear: Tympanic membrane, external ear and ear canal normal.  Nose: Nose normal.  Mouth/Throat: Uvula is midline and mucous membranes are normal. Posterior oropharyngeal erythema present.  Eyes: Conjunctivae are normal.  Neck: Normal range of motion.  Cardiovascular: Normal rate, regular rhythm and normal heart sounds.   Pulmonary/Chest: Effort normal and breath sounds normal.  Lymphadenopathy:       Head (right side): Submental and submandibular adenopathy present. No tonsillar, no preauricular, no posterior auricular and no occipital adenopathy present.       Head (left side): Submental and submandibular adenopathy present. No tonsillar, no preauricular, no  posterior auricular and no occipital adenopathy present.    She has cervical adenopathy.       Right cervical: Deep cervical adenopathy present. No superficial cervical adenopathy present.      Left cervical: Deep cervical adenopathy present. No superficial cervical adenopathy present.       Right: No supraclavicular adenopathy present.       Left: No supraclavicular adenopathy present.  Neurological: She is alert and oriented to person, place, and time. Gait normal.  Skin: Skin is warm and dry.  Psychiatric: Affect normal.  Vitals reviewed.   Results for orders placed or performed in visit on 07/29/16 (from the past 24 hour(s))  POCT rapid strep A     Status: None   Collection Time: 07/29/16  8:53 AM  Result Value Ref Range   Rapid Strep A Screen Negative Negative  POCT CBC     Status: Abnormal   Collection Time: 07/29/16  8:55 AM  Result Value Ref Range   WBC 6.6 4.6 - 10.2 K/uL   Lymph, poc 1.4 0.6 - 3.4   POC LYMPH PERCENT 21.7 10 - 50 %L   MID (cbc) 0.4 0 - 0.9   POC MID % 6.6 0 - 12 %M   POC Granulocyte 4.7 2 - 6.9   Granulocyte percent 71.7 37 - 80 %G   RBC 3.99 (A) 4.04 - 5.48 M/uL   Hemoglobin 13.0 12.2 - 16.2 g/dL   HCT, POC 36.4 (A) 37.7 - 47.9 %   MCV 91.2 80 - 97 fL   MCH, POC 32.6 (A) 27 - 31.2 pg   MCHC 35.8 (A) 31.8 - 35.4 g/dL   RDW, POC 11.8 %   Platelet Count, POC 230 142 - 424 K/uL   MPV 7.2 0 - 99.8 fL  POCT Influenza A/B     Status: None   Collection Time: 07/29/16  9:00 AM  Result Value Ref Range   Influenza A, POC Negative Negative   Influenza B, POC Negative Negative    Assessment and Plan :  1. Sore throat - POCT Influenza A/B - POCT rapid strep A  2. Lymphadenopathy - POCT CBC - Epstein-Barr virus VCA antibody panel  3. Weight gain - TSH  4. Screen for STD (sexually transmitted disease) - HIV antibody  5. Myalgia - POCT Influenza A/B  -Will treat for viral infection with conservative treatment.  -Recommend rest, fluids, sudaphed  OTC, chloraseptic throat lozenges, and ibuprofen as needed for sore throat.  -Follow up in 10 days if symptoms do not improve.  Tenna Delaine PA-C  [facility] 07/29/2016 9:06 AM

## 2016-07-29 NOTE — Patient Instructions (Addendum)
-   We will treat this as a respiratory viral infection. - I recommend you rest, drink plenty of fluids, eat light meals including soups.  -Use flonase for congestion.  -You may also pick up sudaphed for the congestion. - You may use cough syrup at night for your cough and sore throat.  -Please pick up chloraseptic throat lozenges up for your sore throat as well.  - You may also use Tylenol or ibuprofen over-the-counter for your sore throat.  - Please let me know if you are not seeing any improvement or get worse in 10 days. -A good tea to drink is hot water with ginger root, honey, and lemon.        IF you received an x-ray today, you will receive an invoice from [facility]. Please contact [facility] at [phone] with questions or concerns regarding your invoice.   IF you received labwork today, you will receive an invoice from [employer]. Please contact [facility] at [phone] with questions or concerns regarding your invoice.   Our billing staff will not be able to assist you with questions regarding bills from these companies.  You will be contacted with the lab results as soon as they are available. The fastest way to get your results is to activate your My Chart account. Instructions are located on the last page of this paperwork. If you have not heard from [place] regarding the results in 2 weeks, please contact this office.

## 2016-07-30 LAB — EPSTEIN-BARR VIRUS VCA ANTIBODY PANEL
EBV NA IgG: 408 U/mL — ABNORMAL HIGH
EBV VCA IGG: 555 U/mL — AB

## 2016-08-07 DIAGNOSIS — N390 Urinary tract infection, site not specified: Secondary | ICD-10-CM | POA: Diagnosis not present

## 2016-08-29 ENCOUNTER — Ambulatory Visit (INDEPENDENT_AMBULATORY_CARE_PROVIDER_SITE_OTHER): Payer: Federal, State, Local not specified - PPO

## 2016-08-29 ENCOUNTER — Ambulatory Visit (INDEPENDENT_AMBULATORY_CARE_PROVIDER_SITE_OTHER): Payer: Federal, State, Local not specified - PPO | Admitting: Physician Assistant

## 2016-08-29 VITALS — BP 100/62 | HR 93 | Temp 98.4°F | Resp 16 | Ht 65.5 in | Wt 149.0 lb

## 2016-08-29 DIAGNOSIS — J209 Acute bronchitis, unspecified: Secondary | ICD-10-CM

## 2016-08-29 DIAGNOSIS — R05 Cough: Secondary | ICD-10-CM

## 2016-08-29 DIAGNOSIS — R059 Cough, unspecified: Secondary | ICD-10-CM

## 2016-08-29 LAB — POCT CBC
Granulocyte percent: 56.1 %G (ref 37–80)
HCT, POC: 36.9 % — AB (ref 37.7–47.9)
Hemoglobin: 13.7 g/dL (ref 12.2–16.2)
Lymph, poc: 1.9 (ref 0.6–3.4)
MCH, POC: 33.2 pg — AB (ref 27–31.2)
MCHC: 37.2 g/dL — AB (ref 31.8–35.4)
MCV: 89.5 fL (ref 80–97)
MID (cbc): 0.2 (ref 0–0.9)
MPV: 7.2 fL (ref 0–99.8)
POC Granulocyte: 2.7 (ref 2–6.9)
POC LYMPH PERCENT: 39.4 % (ref 10–50)
POC MID %: 4.5 %M (ref 0–12)
Platelet Count, POC: 222 10*3/uL (ref 142–424)
RBC: 4.12 M/uL (ref 4.04–5.48)
RDW, POC: 11.7 %
WBC: 4.8 10*3/uL (ref 4.6–10.2)

## 2016-08-29 MED ORDER — ALBUTEROL SULFATE HFA 108 (90 BASE) MCG/ACT IN AERS: 2.0000 | INHALATION_SPRAY | RESPIRATORY_TRACT | 1 refills | Status: DC | PRN

## 2016-08-29 NOTE — Patient Instructions (Addendum)
Take 2 puffs every 4-6 hours for the next three days. After that, take 2 puffs twice a day for four days. After that, take 2 puffs every 4-6 hours as needed.   This medication is not to be used as a daily medication. If you are not better in three weeks, please return to a clinic.   Thank you for coming in today. I hope you feel we met your needs.  Feel free to call [facility] if you have any questions or further requests.  Please consider signing up for MyChart if you do not already have it, as this is a great way to communicate with me.  Best,  Whitney McVey, PA-C   Acute Bronchitis Bronchitis is inflammation of the airways that extend from the windpipe into the lungs (bronchi). The inflammation often causes mucus to develop. This leads to a cough, which is the most common symptom of bronchitis.  In acute bronchitis, the condition usually develops suddenly and goes away over time, usually in a couple weeks. Smoking, allergies, and asthma can make bronchitis worse. Repeated episodes of bronchitis may cause further lung problems.  CAUSES Acute bronchitis is most often caused by the same virus that causes a cold. The virus can spread from person to person (contagious) through coughing, sneezing, and touching contaminated objects. SIGNS AND SYMPTOMS   Cough.   Fever.   Coughing up mucus.   Body aches.   Chest congestion.   Chills.   Shortness of breath.   Sore throat.  DIAGNOSIS  Acute bronchitis is usually diagnosed through a physical exam. Your health care provider will also ask you questions about your medical history. Tests, such as chest X-rays, are sometimes done to rule out other conditions.  TREATMENT  Acute bronchitis usually goes away in a couple weeks. Oftentimes, no medical treatment is necessary. Medicines are sometimes given for relief of fever or cough. Antibiotic medicines are usually not needed but may be prescribed in certain situations. In some cases, an inhaler  may be recommended to help reduce shortness of breath and control the cough. A cool mist vaporizer may also be used to help thin bronchial secretions and make it easier to clear the chest.  HOME CARE INSTRUCTIONS  Get plenty of rest.   Drink enough fluids to keep your urine clear or pale yellow (unless you have a medical condition that requires fluid restriction). Increasing fluids may help thin your respiratory secretions (sputum) and reduce chest congestion, and it will prevent dehydration.   Take medicines only as directed by your health care provider.  If you were prescribed an antibiotic medicine, finish it all even if you start to feel better.  Avoid smoking and secondhand smoke. Exposure to cigarette smoke or irritating chemicals will make bronchitis worse. If you are a smoker, consider using nicotine gum or skin patches to help control withdrawal symptoms. Quitting smoking will help your lungs heal faster.   Reduce the chances of another bout of acute bronchitis by washing your hands frequently, avoiding people with cold symptoms, and trying not to touch your hands to your mouth, nose, or eyes.   Keep all follow-up visits as directed by your health care provider.  SEEK MEDICAL CARE IF: Your symptoms do not improve after 1 week of treatment.  SEEK IMMEDIATE MEDICAL CARE IF:  You develop an increased fever or chills.   You have chest pain.   You have severe shortness of breath.  You have bloody sputum.   You develop dehydration.  You faint or repeatedly feel like you are going to pass out.  You develop repeated vomiting.  You develop a severe headache. MAKE SURE YOU:   Understand these instructions.  Will watch your condition.  Will get help right away if you are not doing well or get worse.   This information is not intended to replace advice given to you by your health care provider. Make sure you discuss any questions you have with your health care  provider.   Document Released: 11/13/2004 Document Revised: 10/27/2014 Document Reviewed: 03/29/2013 Elsevier Interactive Patient Education 2016 [employer].  IF you received an x-ray today, you will receive an invoice from [facility]. Please contact [facility] at [phone] with questions or concerns regarding your invoice.   IF you received labwork today, you will receive an invoice from [employer]. Please contact [facility] at [phone] with questions or concerns regarding your invoice.   Our billing staff will not be able to assist you with questions regarding bills from these companies.  You will be contacted with the lab results as soon as they are available. The fastest way to get your results is to activate your My Chart account. Instructions are located on the last page of this paperwork. If you have not heard from [place] regarding the results in 2 weeks, please contact this office.

## 2016-08-29 NOTE — Progress Notes (Signed)
Mary Wyatt  MRN: [id number] DOB: 07-13-1986  PCP: Lamar Blinks, MD  Subjective:  Pt presents to clinic for chest tightness x three weeks.  Lives in [place].  Feels like she can't take full deep breaths because she doesn't want to cough. When she does cough, "it's deep". Non-productive.  Happens during the day and at night. Last night her cough kept her up, she notes this has not been happening every night.  Denies fever, chills, congestion, headache, chest pain, nausea, vomiting.  History of RMSF  Treated with Cefdinir 2 weeks ago for UTI  Review of Systems  Constitutional: Negative for activity change, appetite change, chills, diaphoresis, fatigue and fever.  HENT: Negative for congestion, postnasal drip, rhinorrhea, sinus pressure, sneezing and sore throat.   Respiratory: Positive for cough and chest tightness. Negative for shortness of breath, wheezing and stridor.   Cardiovascular: Negative for chest pain and palpitations.  Gastrointestinal: Negative for abdominal pain, diarrhea, nausea and vomiting.  Neurological: Negative for dizziness, weakness, light-headedness and headaches.  Psychiatric/Behavioral: Positive for sleep disturbance.    Patient Active Problem List   Diagnosis Date Noted  . Rhinitis, allergic 04/20/2014  . Generalized anxiety disorder 04/20/2014  . Celiac disease 12/15/2012  . Chest pain 03/04/2012  . Interstitial cystitis     Current Outpatient Prescriptions on File Prior to Visit  Medication Sig Dispense Refill  . ALPRAZolam (XANAX) 0.5 MG tablet TAKE ONE TABLET BY MOUTH DAILY AS NEEDED FOR ANXIETY 90 tablet 0  . BLISOVI 24 FE 1-20 MG-MCG(24) tablet Take 1 tablet by mouth daily.  1  . fluticasone (FLONASE) 50 MCG/ACT nasal spray Place 2 sprays into both nostrils daily. 16 g 0   No current facility-administered medications on file prior to visit.     Allergies  Allergen Reactions  . Diflucan [Fluconazole] Hives     Objective:  BP  100/62 (BP Location: Right Arm, Patient Position: Sitting, Cuff Size: Normal)   Pulse 93   Temp 98.4 F (36.9 C) (Oral)   Resp 16   Ht 5' 5.5" (1.664 m)   Wt 149 lb (67.6 kg)   SpO2 99%   BMI 24.42 kg/m   Physical Exam  Constitutional: She is oriented to person, place, and time and well-developed, well-nourished, and in no distress. No distress.  Cardiovascular: Normal rate, regular rhythm and normal heart sounds.   Pulmonary/Chest: Effort normal. She has no decreased breath sounds. She has no wheezes. She has no rhonchi. She has no rales.  Neurological: She is alert and oriented to person, place, and time. GCS score is 15.  Skin: Skin is warm and dry.  Psychiatric: Mood, memory, affect and judgment normal.  Vitals reviewed.   Dg Chest 2 View  Result Date: 08/29/2016 CLINICAL DATA:  Cough for 3 weeks. EXAM: CHEST  2 VIEW COMPARISON:  Radiographs of August 02, 2015. FINDINGS: The heart size and mediastinal contours are within normal limits. Both lungs are clear. No pneumothorax or pleural effusion is noted. The visualized skeletal structures are unremarkable. IMPRESSION: No active cardiopulmonary disease. Electronically Signed   By: Marijo Conception, M.D.   On: 08/29/2016 11:00    Assessment and Plan :  1. Cough 2. Acute bronchitis, unspecified organism - POCT CBC - DG Chest 2 View; Future - albuterol (PROVENTIL HFA;VENTOLIN HFA) 108 (90 Base) MCG/ACT inhaler; Inhale 2 puffs into the lungs every 4 (four) hours as needed for wheezing or shortness of breath (cough, shortness of breath or wheezing.).  Dispense: 1 Inhaler;  Refill: 1   Whitney Arisbeth Purrington, PA-C  [facility] 08/29/2016 10:34 AM

## 2016-09-04 ENCOUNTER — Encounter: Payer: Self-pay | Admitting: Physician Assistant

## 2016-09-10 ENCOUNTER — Ambulatory Visit: Payer: Self-pay | Admitting: Family Medicine

## 2016-09-10 ENCOUNTER — Encounter: Payer: Self-pay | Admitting: Family Medicine

## 2016-09-10 ENCOUNTER — Ambulatory Visit (INDEPENDENT_AMBULATORY_CARE_PROVIDER_SITE_OTHER): Payer: Federal, State, Local not specified - PPO | Admitting: Family Medicine

## 2016-09-10 VITALS — BP 116/86 | HR 79 | Temp 97.6°F | Resp 16 | Ht 65.75 in | Wt 147.2 lb

## 2016-09-10 DIAGNOSIS — R5381 Other malaise: Secondary | ICD-10-CM

## 2016-09-10 DIAGNOSIS — R300 Dysuria: Secondary | ICD-10-CM

## 2016-09-10 DIAGNOSIS — N3010 Interstitial cystitis (chronic) without hematuria: Secondary | ICD-10-CM

## 2016-09-10 DIAGNOSIS — R05 Cough: Secondary | ICD-10-CM

## 2016-09-10 DIAGNOSIS — N898 Other specified noninflammatory disorders of vagina: Secondary | ICD-10-CM | POA: Diagnosis not present

## 2016-09-10 DIAGNOSIS — R059 Cough, unspecified: Secondary | ICD-10-CM

## 2016-09-10 LAB — POCT URINALYSIS DIP (MANUAL ENTRY)
Bilirubin, UA: NEGATIVE
Glucose, UA: NEGATIVE
LEUKOCYTES UA: NEGATIVE
Nitrite, UA: NEGATIVE
PROTEIN UA: NEGATIVE
Spec Grav, UA: 1.01
UROBILINOGEN UA: 0.2
pH, UA: 7

## 2016-09-10 LAB — POCT URINE PREGNANCY: PREG TEST UR: NEGATIVE

## 2016-09-10 NOTE — Progress Notes (Signed)
08/24/2017 

## 2016-09-10 NOTE — Patient Instructions (Signed)
     IF you received an x-ray today, you will receive an invoice from [facility]. Please contact [facility] at [phone] with questions or concerns regarding your invoice.   IF you received labwork today, you will receive an invoice from [facility]. Please contact [facility] at [phone] with questions or concerns regarding your invoice.   Our billing staff will not be able to assist you with questions regarding bills from these companies.  You will be contacted with the lab results as soon as they are available. The fastest way to get your results is to activate your My Chart account. Instructions are located on the last page of this paperwork. If you have not heard from us regarding the results in 2 weeks, please contact this office.      

## 2016-09-10 NOTE — Progress Notes (Signed)
Subjective:    Patient ID: Mary Wyatt, female    DOB: 05/16/1986, 30 y.o.   MRN: [id number]  09/10/2016  Urinary Frequency (WITH BURNING X FEW WEEKS) and Vaginal Discharge (WITH ODOR X 1-2 WEEKS)   HPI This 30 y.o. female presents for evaluation of dysuria, vaginal odor.  Lives in [facility] near [place].  Job is very toxic; hates job. Having a hard time finding a new provider. Went next door 1.5 weeks ago; diagnosed with bonrchitis; still very wheezy; something is not right.  Also feeling like UTI all the time; onset weeks ago.  Might be nothing.  S/p urology x 2 in the past.  Did not have a great experience either time. Suggested IC.  Recommended changing diet.  Felt overwhelmed.  Urologist in [place] or [place]. Then went to [facility]. Three visits in three months; only one culture positive; did abx each time which concerns pt. Feels better with abx.  Some vaginal discharge with odor.  Not sure if urine smells funny or if vaginal odor.  Not sexually active; last sex end of July. Agreeable to STD screening. Recurrent BV; uses metronidazole with temporary improvement.  Did not like prolonged use of boric acid.  Taking acidophilis.  Current OCP for eight years; current OCP every three months.  Daily lightheadedness and whoozy.    Seen recently for cough; treated on 08/29/16 for bronchitis with Albuterol.  No abx prescribed; has not felt well since that visit.  Not happy with current employment; not a happy environment.s/p CXR at that visit that was negative.   Cough is improving but has lingered. Denies SOB or fever. No rhinorrhea, nasal congestion.   Review of Systems  Constitutional: Negative for chills, diaphoresis, fatigue and fever.  HENT: Negative for congestion, ear pain, postnasal drip, rhinorrhea and sore throat.   Eyes: Negative for visual disturbance.  Respiratory: Positive for cough. Negative for shortness of breath and wheezing.   Cardiovascular: Negative for  chest pain, palpitations and leg swelling.  Gastrointestinal: Negative for abdominal pain, constipation, diarrhea, nausea and vomiting.  Endocrine: Negative for cold intolerance, heat intolerance, polydipsia, polyphagia and polyuria.  Genitourinary: Positive for difficulty urinating, dysuria and vaginal discharge. Negative for decreased urine volume, dyspareunia, flank pain, frequency, genital sores, hematuria, pelvic pain, urgency, vaginal bleeding and vaginal pain.  Neurological: Negative for dizziness, tremors, seizures, syncope, facial asymmetry, speech difficulty, weakness, light-headedness, numbness and headaches.    Past Medical History:  Diagnosis Date  . Allergy   . Anemia   . Anxiety   . Chest pain   . Depression   . Genital warts   . GERD (gastroesophageal reflux disease)   . Interstitial cystitis   . RMSF [facility] spotted fever) 2015  . Ulcer [facility])    Past Surgical History:  Procedure Laterality Date  . OVARIAN CYST REMOVAL    . TONSILLECTOMY     Allergies  Allergen Reactions  . Diflucan [Fluconazole] Hives    Social History   Social History  . Marital status: Single    Spouse name: N/A  . Number of children: 0  . Years of education: N/A   Occupational History  . [employer]   Social History Main Topics  . Smoking status: Former Research scientist (life sciences)  . Smokeless tobacco: Never Used  . Alcohol use 0.0 - 1.8 oz/week     Comment: Occas.  . Drug use: No  . Sexual activity: Yes  Birth control/ protection: Pill   Other Topics Concern  . Not on file   Social History Narrative      Marital status:  Single; parents in [place]: none      Employment: [place] [employer] works indoors       Tobacco: none      Alcohol: socially; twice weekly      Drugs: none      Exercise: sporadic; walking sometimes         Daily caffeine    Family History  Problem Relation Age of Onset  . GI problems Sister   . Asthma  Sister   . Heart disease Maternal Grandmother   . Cancer Maternal Aunt     breast  . Colon cancer Neg Hx        Objective:    BP 116/86 (BP Location: Left Arm, Patient Position: Sitting, Cuff Size: Normal)   Pulse 79   Temp 97.6 F (36.4 C) (Oral)   Resp 16   Ht 5' 5.75" (1.67 m)   Wt 147 lb 3.2 oz (66.8 kg)   LMP 06/20/2016 Comment: IRREGULAR PERIODS  SpO2 100%   BMI 23.94 kg/m  Physical Exam  Constitutional: She is oriented to person, place, and time. She appears well-developed and well-nourished. No distress.  HENT:  Head: Normocephalic and atraumatic.  Right Ear: External ear normal.  Left Ear: External ear normal.  Nose: Nose normal.  Mouth/Throat: Oropharynx is clear and moist.  Eyes: Conjunctivae and EOM are normal. Pupils are equal, round, and reactive to light.  Neck: Normal range of motion. Neck supple. Carotid bruit is not present. No thyromegaly present.  Cardiovascular: Normal rate, regular rhythm, normal heart sounds and intact distal pulses.  Exam reveals no gallop and no friction rub.   No murmur heard. Pulmonary/Chest: Effort normal and breath sounds normal. She has no wheezes. She has no rales.  Abdominal: Soft. Bowel sounds are normal. She exhibits no distension and no mass. There is no tenderness. There is no rebound and no guarding.  Genitourinary: Vagina normal and uterus normal. There is no rash, tenderness, lesion or injury on the right labia. There is no rash, tenderness, lesion or injury on the left labia. Cervix exhibits no motion tenderness, no discharge and no friability. Right adnexum displays no mass, no tenderness and no fullness. Left adnexum displays no mass, no tenderness and no fullness. No vaginal discharge found.  Lymphadenopathy:    She has no cervical adenopathy.  Neurological: She is alert and oriented to person, place, and time. No cranial nerve deficit.  Skin: Skin is warm and dry. No rash noted. She is not diaphoretic. No erythema. No  pallor.  Psychiatric: She has a normal mood and affect. Her behavior is normal.        Assessment & Plan:   1. Vaginal odor   2. Dysuria   3. Cough   4. Malaise    -recurrent symptoms; benign exam; send wet prep, GC/Chlam, Urine culture. -supportive care for persistent cough. -obtain labs due to ongoing fatigue.   Orders Placed This Encounter  Procedures  . Urine culture  . GC/Chlamydia Probe Amp  . WET PREP BY MOLECULAR PROBE  . Comprehensive metabolic panel  . CBC with Differential/Platelet  . POCT urinalysis dipstick  . POCT urine pregnancy   No orders of the defined types were placed in this encounter.   No Follow-up on file.   Kaleigh Spiegelman Elayne Guerin, M.D. [facility] [address] [place], [place]  [address] [phone] phone [phone] fax

## 2016-09-17 LAB — COMPREHENSIVE METABOLIC PANEL
ALT: 65 U/L — ABNORMAL HIGH (ref 6–29)
AST: 39 U/L — ABNORMAL HIGH (ref 10–30)
Albumin: 4.6 g/dL (ref 3.6–5.1)
Alkaline Phosphatase: 45 U/L (ref 33–115)
BUN: 8 mg/dL (ref 7–25)
CHLORIDE: 102 mmol/L (ref 98–110)
CO2: 23 mmol/L (ref 20–31)
Calcium: 9.9 mg/dL (ref 8.6–10.2)
Creat: 0.64 mg/dL (ref 0.50–1.10)
Glucose, Bld: 68 mg/dL (ref 65–99)
POTASSIUM: 4.1 mmol/L (ref 3.5–5.3)
Sodium: 137 mmol/L (ref 135–146)
TOTAL PROTEIN: 7.3 g/dL (ref 6.1–8.1)
Total Bilirubin: 0.6 mg/dL (ref 0.2–1.2)

## 2016-09-17 LAB — WET PREP BY MOLECULAR PROBE
Candida species: NEGATIVE
GARDNERELLA VAGINALIS: NEGATIVE
TRICHOMONAS VAG: NEGATIVE

## 2016-09-17 LAB — GC/CHLAMYDIA PROBE AMP
CT Probe RNA: NOT DETECTED
GC PROBE AMP APTIMA: NOT DETECTED

## 2016-09-18 LAB — CBC WITH DIFFERENTIAL/PLATELET

## 2016-09-18 LAB — URINE CULTURE: ORGANISM ID, BACTERIA: NO GROWTH

## 2016-10-14 ENCOUNTER — Ambulatory Visit (INDEPENDENT_AMBULATORY_CARE_PROVIDER_SITE_OTHER): Payer: Federal, State, Local not specified - PPO | Admitting: Family Medicine

## 2016-10-14 ENCOUNTER — Encounter: Payer: Self-pay | Admitting: Family Medicine

## 2016-10-14 VITALS — BP 112/71 | HR 68 | Temp 98.9°F | Resp 16 | Ht 69.0 in | Wt 154.0 lb

## 2016-10-14 DIAGNOSIS — R7989 Other specified abnormal findings of blood chemistry: Secondary | ICD-10-CM | POA: Diagnosis not present

## 2016-10-14 DIAGNOSIS — K6289 Other specified diseases of anus and rectum: Secondary | ICD-10-CM

## 2016-10-14 DIAGNOSIS — R5383 Other fatigue: Secondary | ICD-10-CM

## 2016-10-14 DIAGNOSIS — R945 Abnormal results of liver function studies: Secondary | ICD-10-CM

## 2016-10-14 MED ORDER — LIDOCAINE (ANORECTAL) 5 % EX GEL: 1.0000 "application " | Freq: Three times a day (TID) | CUTANEOUS | 0 refills | Status: DC | PRN

## 2016-10-14 MED ORDER — HYDROCORTISONE ACETATE 25 MG RE SUPP: 25.0000 mg | Freq: Two times a day (BID) | RECTAL | 1 refills | Status: DC

## 2016-10-14 NOTE — Patient Instructions (Addendum)
1.  Stool softener daily (Colace one pill twice daily OR Miralax once daily). 2. Sitz baths once daily.    IF you received an x-ray today, you will receive an invoice from [facility]. Please contact [facility] at [phone] with questions or concerns regarding your invoice.   IF you received labwork today, you will receive an invoice from [facility]. Please contact [facility] at [phone] with questions or concerns regarding your invoice.   Our billing staff will not be able to assist you with questions regarding bills from these companies.  You will be contacted with the lab results as soon as they are available. The fastest way to get your results is to activate your My Chart account. Instructions are located on the last page of this paperwork. If you have not heard from [place] regarding the results in 2 weeks, please contact this office.      Anal Fissure, Adult An anal fissure is a small tear or crack in the skin around the anus. Bleeding from a fissure usually stops on its own within a few minutes. However, bleeding will often occur again with each bowel movement until the crack heals. What are the causes? This condition may be caused by:  Passing large, hard stool (feces).  Frequent diarrhea.  Constipation.  Inflammatory bowel disease (Crohn disease or ulcerative colitis).  Infections.  Anal sex. What are the signs or symptoms? Symptoms of this condition include:  Bleeding from the rectum.  Small amounts of blood seen on your stool, on toilet paper, or in the toilet after a bowel movement.  Painful bowel movements.  Itching or irritation around the anus. How is this diagnosed? A health care provider may diagnose this condition by closely examining the anal area. An anal fissure can usually be seen with careful inspection. In some cases, a rectal exam may be performed, or a short tube (anoscope) may be used to examine the anal canal. How is this  treated? Treatment for this condition may include:  Taking steps to avoid constipation. This may include making changes to your diet, such as increasing your intake of fiber or fluid.  Taking fiber supplements. These supplements can soften your stool to help make bowel movements easier. Your health care provider may also prescribe a stool softener if your stool is often hard.  Taking sitz baths. This may help to heal the tear.  Using medicated creams or ointments. These may be prescribed to lessen discomfort. Follow these instructions at home: Eating and drinking  Avoid foods that may be constipating, such as bananas and dairy products.  Drink enough fluid to keep your urine clear or pale yellow.  Maintain a diet that is high in fruits, whole grains, and vegetables. General instructions  Keep the anal area as clean and dry as possible.  Take sitz baths as told by your health care provider. Do not use soap in the sitz baths.  Take over-the-counter and prescription medicines only as told by your health care provider.  Use creams or ointments only as told by your health care provider.  Keep all follow-up visits as told by your health care provider. This is important. Contact a health care provider if:  You have more bleeding.  You have a fever.  You have diarrhea that is mixed with blood.  You continue to have pain.  Your problem is getting worse rather than better. This information is not intended to replace advice given to you by your health care provider. Make sure  you discuss any questions you have with your health care provider. Document Released: 10/06/2005 Document Revised: 02/13/2016 Document Reviewed: 01/01/2015 Elsevier Interactive Patient Education  2017 [employer].

## 2016-10-14 NOTE — Progress Notes (Signed)
Subjective:    Patient ID: Mary Wyatt, female    DOB: Oct 05, 1986, 30 y.o.   MRN: [id number]  10/14/2016  Hemorrhoids   HPI This 30 y.o. female presents for evaluation of one month history of mild perianal region.  Has gradually worsened; acute worsening in the past week. No straining.  Feels constricted.  Difficult to get things moving and out.  Pain with sitting and driving.  Miserable driving to [place].  Noticing blood in stool.  No constipation.   Gets clammy with bowel movement.  Extremely painful to have b.m.  Pain radiates in strange ways.  Pain radiates into arms and vaginal.  Has tried preparation H target off brand; then purchased real deal.  No improvement.  Suppositories for hemorrhoid pain. Not cutting it.  Bright red blood in toilet and toilet paper.  Can feel something underneath.  Noticed a little bulge a year ago but pain free.    Vaginal odor: resolved now; no recurrence.  Now having discharge mild.  Elevated LFTs: on last labs; drinks one drink per week. Takes BC powder rarely.  Wt Readings from Last 3 Encounters:  10/14/16 154 lb (69.9 kg)  09/10/16 147 lb 3.2 oz (66.8 kg)  08/29/16 149 lb (67.6 kg)    BP Readings from Last 3 Encounters:  10/14/16 112/71  09/10/16 116/86  08/29/16 100/62      Review of Systems  Constitutional: Negative for chills, diaphoresis, fatigue and fever.  Eyes: Negative for visual disturbance.  Respiratory: Negative for cough and shortness of breath.   Cardiovascular: Negative for chest pain, palpitations and leg swelling.  Gastrointestinal: Positive for constipation and rectal pain. Negative for abdominal pain, anal bleeding, diarrhea, nausea and vomiting.  Endocrine: Negative for cold intolerance, heat intolerance, polydipsia, polyphagia and polyuria.  Genitourinary: Positive for vaginal discharge. Negative for decreased urine volume, difficulty urinating, dysuria, flank pain, frequency, genital sores, hematuria, menstrual  problem, pelvic pain, urgency, vaginal bleeding and vaginal pain.  Neurological: Negative for dizziness, tremors, seizures, syncope, facial asymmetry, speech difficulty, weakness, light-headedness, numbness and headaches.    Past Medical History:  Diagnosis Date  . Allergy   . Anemia   . Anxiety   . Chest pain   . Depression   . Genital warts   . GERD (gastroesophageal reflux disease)   . Interstitial cystitis   . RMSF [facility] spotted fever) 2015  . Ulcer [facility])    Past Surgical History:  Procedure Laterality Date  . OVARIAN CYST REMOVAL    . TONSILLECTOMY     Allergies  Allergen Reactions  . Diflucan [Fluconazole] Hives    Social History   Social History  . Marital status: Single    Spouse name: N/A  . Number of children: 0  . Years of education: N/A   Occupational History  . [employer]   Social History Main Topics  . Smoking status: Former Research scientist (life sciences)  . Smokeless tobacco: Never Used  . Alcohol use 0.0 - 1.8 oz/week     Comment: Occas.  . Drug use: No  . Sexual activity: Yes    Birth control/ protection: Pill   Other Topics Concern  . Not on file   Social History Narrative      Marital status:  Single; parents in [place]      Children: none      Employment: [employer] works indoors       Tobacco: none  Alcohol: socially; twice weekly      Drugs: none      Exercise: sporadic; walking sometimes         Daily caffeine    Family History  Problem Relation Age of Onset  . GI problems Sister   . Asthma Sister   . Heart disease Maternal Grandmother   . Cancer Maternal Aunt     breast  . Colon cancer Neg Hx        Objective:    BP 112/71 (BP Location: Right Arm, Patient Position: Sitting, Cuff Size: Small)   Pulse 68   Temp 98.9 F (37.2 C) (Oral)   Resp 16   Ht 5' 9"  (1.753 m)   Wt 154 lb (69.9 kg)   SpO2 98%   BMI 22.74 kg/m  Physical Exam  Constitutional: She is oriented to person,  place, and time. She appears well-developed and well-nourished. No distress.  HENT:  Head: Normocephalic and atraumatic.  Right Ear: External ear normal.  Left Ear: External ear normal.  Nose: Nose normal.  Mouth/Throat: Oropharynx is clear and moist.  Eyes: Conjunctivae and EOM are normal. Pupils are equal, round, and reactive to light.  Neck: Normal range of motion. Neck supple. Carotid bruit is not present. No thyromegaly present.  Cardiovascular: Normal rate, regular rhythm, normal heart sounds and intact distal pulses.  Exam reveals no gallop and no friction rub.   No murmur heard. Pulmonary/Chest: Effort normal and breath sounds normal. She has no wheezes. She has no rales.  Abdominal: Soft. Bowel sounds are normal. She exhibits no distension and no mass. There is no tenderness. There is no rebound and no guarding.  Genitourinary: Vagina normal. Rectal exam shows tenderness. Rectal exam shows no external hemorrhoid and no mass. There is no rash, tenderness or lesion on the right labia. There is no rash, tenderness or lesion on the left labia. Right adnexum displays no mass, no tenderness and no fullness. Left adnexum displays no mass, no tenderness and no fullness.  Lymphadenopathy:    She has no cervical adenopathy.  Neurological: She is alert and oriented to person, place, and time. No cranial nerve deficit.  Skin: Skin is warm and dry. No rash noted. She is not diaphoretic. No erythema. No pallor.  Psychiatric: She has a normal mood and affect. Her behavior is normal.   Depression screen [facility] 2/9 10/14/2016 09/10/2016 08/29/2016 07/29/2016 04/07/2016  Decreased Interest 0 0 0 0 0  Down, Depressed, Hopeless 0 0 0 0 0  PHQ - 2 Score 0 0 0 0 0        Assessment & Plan:   1. Perianal pain   2. Other fatigue   3. Elevated LFTs    -New onset; ddx includes hemorrhoids versus anal fissure; history suggestive of anal fissure; rx for Anusol HC and Lidocaine anorectal gel provided; if  no improvement in 1-2 weeks, refer to GI. -recommend stool softener to allow healing of anal fissure. -with recent fatigue, obtain CBC and CMET today; recent stressors. -repeat LFTs today; minimal alcohol and acetaminophen intake.   Orders Placed This Encounter  Procedures  . CBC with Differential/Platelet  . Comprehensive metabolic panel   Meds ordered this encounter  Medications  . hydrocortisone (ANUSOL-HC) 25 MG suppository    Sig: Place 1 suppository (25 mg total) rectally 2 (two) times daily.    Dispense:  24 suppository    Refill:  1  . Lidocaine, Anorectal, 5 % GEL    Sig: Apply  1 application topically 3 (three) times daily as needed.    Dispense:  30 g    Refill:  0    No Follow-up on file.   Kylani Wires Elayne Guerin, M.D. [facility] [address] [place], [place]  [address] [phone] phone [phone] fax

## 2016-10-15 LAB — COMPREHENSIVE METABOLIC PANEL
A/G RATIO: 1.8 (ref 1.2–2.2)
ALT: 32 IU/L (ref 0–32)
AST: 23 IU/L (ref 0–40)
Albumin: 4.7 g/dL (ref 3.5–5.5)
Alkaline Phosphatase: 49 IU/L (ref 39–117)
BILIRUBIN TOTAL: 0.4 mg/dL (ref 0.0–1.2)
BUN/Creatinine Ratio: 13 (ref 9–23)
BUN: 9 mg/dL (ref 6–20)
CHLORIDE: 101 mmol/L (ref 96–106)
CO2: 23 mmol/L (ref 18–29)
Calcium: 10.4 mg/dL — ABNORMAL HIGH (ref 8.7–10.2)
Creatinine, Ser: 0.7 mg/dL (ref 0.57–1.00)
GFR calc non Af Amer: 115 mL/min/{1.73_m2} (ref >59)
GFR, EST AFRICAN AMERICAN: 133 mL/min/{1.73_m2} (ref >59)
GLOBULIN, TOTAL: 2.6 g/dL (ref 1.5–4.5)
Glucose: 84 mg/dL (ref 65–99)
POTASSIUM: 4.3 mmol/L (ref 3.5–5.2)
SODIUM: 140 mmol/L (ref 134–144)
Total Protein: 7.3 g/dL (ref 6.0–8.5)

## 2016-10-15 LAB — CBC WITH DIFFERENTIAL/PLATELET
BASOS: 1 %
Basophils Absolute: 0 10*3/uL (ref 0.0–0.2)
EOS (ABSOLUTE): 0.3 10*3/uL (ref 0.0–0.4)
Eos: 5 %
Hematocrit: 41.4 % (ref 34.0–46.6)
Hemoglobin: 13.8 g/dL (ref 11.1–15.9)
Immature Grans (Abs): 0 10*3/uL (ref 0.0–0.1)
Immature Granulocytes: 0 %
LYMPHS ABS: 2 10*3/uL (ref 0.7–3.1)
Lymphs: 33 %
MCH: 31.2 pg (ref 26.6–33.0)
MCHC: 33.3 g/dL (ref 31.5–35.7)
MCV: 94 fL (ref 79–97)
MONOS ABS: 0.4 10*3/uL (ref 0.1–0.9)
Monocytes: 7 %
NEUTROS ABS: 3.4 10*3/uL (ref 1.4–7.0)
Neutrophils: 54 %
PLATELETS: 304 10*3/uL (ref 150–379)
RBC: 4.42 x10E6/uL (ref 3.77–5.28)
RDW: 13.3 % (ref 12.3–15.4)
WBC: 6.2 10*3/uL (ref 3.4–10.8)

## 2016-12-02 ENCOUNTER — Encounter: Payer: Self-pay | Admitting: Family Medicine

## 2016-12-02 DIAGNOSIS — K6289 Other specified diseases of anus and rectum: Secondary | ICD-10-CM

## 2016-12-05 DIAGNOSIS — N946 Dysmenorrhea, unspecified: Secondary | ICD-10-CM | POA: Diagnosis not present

## 2016-12-08 MED ORDER — HYDROCORTISONE ACETATE 25 MG RE SUPP: 25.0000 mg | Freq: Two times a day (BID) | RECTAL | 1 refills | Status: DC

## 2017-01-01 ENCOUNTER — Encounter (INDEPENDENT_AMBULATORY_CARE_PROVIDER_SITE_OTHER): Payer: Federal, State, Local not specified - PPO

## 2017-01-01 ENCOUNTER — Other Ambulatory Visit: Payer: Self-pay | Admitting: Physician Assistant

## 2017-01-01 ENCOUNTER — Ambulatory Visit (INDEPENDENT_AMBULATORY_CARE_PROVIDER_SITE_OTHER): Payer: Federal, State, Local not specified - PPO | Admitting: Physician Assistant

## 2017-01-01 ENCOUNTER — Encounter: Payer: Self-pay | Admitting: Physician Assistant

## 2017-01-01 VITALS — BP 92/72 | HR 88 | Ht 65.3 in | Wt 152.5 lb

## 2017-01-01 DIAGNOSIS — K900 Celiac disease: Secondary | ICD-10-CM | POA: Diagnosis not present

## 2017-01-01 DIAGNOSIS — K602 Anal fissure, unspecified: Secondary | ICD-10-CM

## 2017-01-01 LAB — SEDIMENTATION RATE: Sed Rate: 1 mm/hr (ref 0–20)

## 2017-01-01 LAB — C-REACTIVE PROTEIN: CRP: 0.2 mg/dL — ABNORMAL LOW (ref 0.5–20.0)

## 2017-01-01 MED ORDER — DILTIAZEM GEL 2 %: 1.0000 "application " | Freq: Three times a day (TID) | CUTANEOUS | 1 refills | Status: DC

## 2017-01-01 NOTE — Progress Notes (Signed)
Subjective:    Patient ID: Mary Wyatt, female    DOB: Feb 04, 1986, 31 y.o.   MRN: [id number]  HPI  Mary Wyatt is a pleasant 31 year old white female, known previously to Dr. Deatra Ina in last seen here in 2014. She comes in today with complaints of rectal pain. She also mentions that she had previously been diagnosed with probable celiac disease but says she isn't sure whether that was confirmed. Patient relates that her anal pain started in December when she knew did excruciating pain with passage of bowel movements. She started using Preparation H suppositories and then Anusol HC suppositories per her PCP which says have helped. She was seen by Dr. Reginia Forts, had rectal exam and was told she probably had an anal fissure. She has not noticed any melena or hematochezia. She's been taking a stool softener on a regular basis but says is making her stools very pasty and she would like to get off of it. She is not having any significant problems with constipation currently and says that her anal discomfort is significantly improved though not resolved. She says she still has some mild burning. She says she has not been on a gluten-free diet and had only ever followed a gluten-free diet for about 6 weeks at one point in 2013. She does have some ongoing abdominal tenderness which she says she's had for years and nausea intermittently. She did have EGD in 2013 per Dr. Verdia Kuba. Biopsies at that time showed severe and diffuse villous blunting most consistent with gluten enteropathy. She then came here and had EGD in March 2014 per Dr. Deatra Ina and biopsy showed an intact villous architecture with increased duodenal lymphocytes, question early sprue.  Review of Systems Pertinent positive and negative review of systems were noted in the above HPI section.  All other review of systems was otherwise negative.  Outpatient Encounter Prescriptions as of 01/01/2017  Medication Sig  . ALPRAZolam (XANAX) 0.5 MG tablet  TAKE ONE TABLET BY MOUTH DAILY AS NEEDED FOR ANXIETY  . docusate sodium (COLACE) 100 MG capsule Take 100 mg by mouth daily.  Marland Kitchen KAITLIB FE 0.8-25 MG-MCG tablet as directed.  . Lidocaine, Anorectal, 5 % GEL Apply 1 application topically 3 (three) times daily as needed.  . diltiazem 2 % GEL Apply 1 application topically 3 (three) times daily.  . hydrocortisone (ANUSOL-HC) 25 MG suppository Place 1 suppository (25 mg total) rectally 2 (two) times daily. (Patient not taking: Reported on 01/01/2017)  . [DISCONTINUED] albuterol (PROVENTIL HFA;VENTOLIN HFA) 108 (90 Base) MCG/ACT inhaler Inhale 2 puffs into the lungs every 4 (four) hours as needed for wheezing or shortness of breath (cough, shortness of breath or wheezing.).  . [DISCONTINUED] BLISOVI 24 FE 1-20 MG-MCG(24) tablet Take 1 tablet by mouth daily.  . [DISCONTINUED] fluticasone (FLONASE) 50 MCG/ACT nasal spray Place 2 sprays into both nostrils daily. (Patient not taking: Reported on 10/14/2016)   No facility-administered encounter medications on file as of 01/01/2017.    Allergies  Allergen Reactions  . Diflucan [Fluconazole] Hives   Patient Active Problem List   Diagnosis Date Noted  . Rhinitis, allergic 04/20/2014  . Generalized anxiety disorder 04/20/2014  . Celiac disease 12/15/2012  . Interstitial cystitis    Social History   Social History  . Marital status: Single    Spouse name: N/A  . Number of children: 0  . Years of education: N/A   Occupational History  . [employer]    Social History Main Topics  .  Smoking status: Former Research scientist (life sciences)  . Smokeless tobacco: Never Used  . Alcohol use 0.0 - 1.8 oz/week     Comment: Occas.  . Drug use: No  . Sexual activity: Yes    Birth control/ protection: Pill   Other Topics Concern  . Not on file   Social History Narrative      Marital status:  Single; parents in [place]: none      Employment: [employer] works indoors       Tobacco: none      Alcohol: socially;  twice weekly      Drugs: none      Exercise: sporadic; walking sometimes         Daily caffeine     Ms. Mcaulay's family history includes Asthma in her sister; Cancer in her maternal aunt; GI problems in her sister; Heart disease in her maternal grandmother.      Objective:    Vitals:   01/01/17 0832  BP: 92/72  Pulse: 88    Physical Exam  well-developed young white female in no acute distress, pleasant blood pressure 92/72 pulse 88, Height 5 foot 5, weight 152, BMII 25.1. HEENT; nontraumatic, cephalic EOMI PERRLA sclera anicteric, Cardiovascular; regular rate and rhythm with S1-S2 no murmur or gallop, Pulmonary ;clear bilaterally, Abdomen ;soft, she has some rather generalized tenderness more noticeable in the right mid and right lower quadrant there is no guarding or rebound no palpable mass or hepatosplenomegaly, bowel sounds are present, Rectal; exam not done, Ext; no clubbing cyanosis or edema skin warm dry, Neuropsych ;mood and affect appropriate       Assessment & Plan:   #29 31 year old white female with 3 month history of anal pain with bowel movements. History is very consistent with an anal fissure which is healing as she is significantly improved. #2 probable celiac disease-2 previous EGDs, one in 2013 very consistent with gluten enteropathy and repeat exam in 2014 less impressive but still showing increased duodenal lymphocytosis consistent with probable early sprue  #3 interstitial cystitis #4  anxiety  Plan; recticare/lidocaine 5% gel apply 2-3 times daily as needed for discomfort will also send a prescription for diltiazem gel 2%. As she is significantly improved currently she may not need to use this at present but if symptoms recur we will start 3 times daily for at least a month or until symptoms have resolved. We'll check sedimentation rate, CRP and celiac panel. Further plans pending results of above. Patient will be established with Dr. Fuller Plan.  Kelan Pritt Genia Harold PA-C 01/01/2017   Cc: Wardell Honour, MD

## 2017-01-01 NOTE — Patient Instructions (Signed)
Your physician has requested that you go to the basement for the following lab work before leaving today: ESR, CRP, Celiac panel   Use Recticare 5% Lidocaine - apply 3 times daily as needed.  Coupon provided.   We have sent the following medications to [facility] for you to pick up at your convenience: Diltiziem 2%   Follow up with Nicoletta Ba PA-C or Dr Lucio Edward as needed.   I appreciate the opportunity to care for you.

## 2017-01-01 NOTE — Progress Notes (Signed)
Reviewed and agree with management plan.  Malcolm T. Stark, MD FACG 

## 2017-01-02 ENCOUNTER — Other Ambulatory Visit: Payer: Federal, State, Local not specified - PPO

## 2017-01-05 LAB — ENDOMYSIAL AB IGA RFLX TITER: ENDOMYSIAL SCREEN: NEGATIVE

## 2017-01-05 LAB — CELIAC DISEASE COMPREHENSIVE PANEL WITH REFLEXES
IGA: 66 mg/dL — AB (ref 81–463)
TISSUE TRANSGLUTAMINASE AB, IGA: 1 U/mL (ref <4)

## 2017-01-05 LAB — TISSUE TRANSGLUTAMINASE, IGG: Tissue Transglut Ab: 1 U/mL (ref <6)

## 2017-01-22 DIAGNOSIS — L814 Other melanin hyperpigmentation: Secondary | ICD-10-CM | POA: Diagnosis not present

## 2017-01-22 DIAGNOSIS — D1801 Hemangioma of skin and subcutaneous tissue: Secondary | ICD-10-CM | POA: Diagnosis not present

## 2017-01-22 DIAGNOSIS — D225 Melanocytic nevi of trunk: Secondary | ICD-10-CM | POA: Diagnosis not present

## 2017-01-22 DIAGNOSIS — D2230 Melanocytic nevi of unspecified part of face: Secondary | ICD-10-CM | POA: Diagnosis not present

## 2017-02-11 ENCOUNTER — Ambulatory Visit (INDEPENDENT_AMBULATORY_CARE_PROVIDER_SITE_OTHER): Payer: BC Managed Care – PPO | Admitting: Family Medicine

## 2017-02-11 ENCOUNTER — Encounter: Payer: Self-pay | Admitting: Family Medicine

## 2017-02-11 VITALS — BP 110/69 | HR 86 | Temp 98.0°F | Resp 16 | Ht 65.0 in | Wt 150.0 lb

## 2017-02-11 DIAGNOSIS — K900 Celiac disease: Secondary | ICD-10-CM | POA: Diagnosis not present

## 2017-02-11 DIAGNOSIS — K602 Anal fissure, unspecified: Secondary | ICD-10-CM | POA: Diagnosis not present

## 2017-02-11 DIAGNOSIS — N3010 Interstitial cystitis (chronic) without hematuria: Secondary | ICD-10-CM | POA: Diagnosis not present

## 2017-02-11 DIAGNOSIS — F411 Generalized anxiety disorder: Secondary | ICD-10-CM

## 2017-02-11 DIAGNOSIS — J301 Allergic rhinitis due to pollen: Secondary | ICD-10-CM

## 2017-02-11 DIAGNOSIS — F430 Acute stress reaction: Secondary | ICD-10-CM | POA: Diagnosis not present

## 2017-02-11 MED ORDER — ALPRAZOLAM 0.5 MG PO TABS: 0.5000 mg | ORAL_TABLET | Freq: Every day | ORAL | 0 refills | Status: DC | PRN

## 2017-02-11 MED ORDER — FLUTICASONE PROPIONATE 50 MCG/ACT NA SUSP: 2.0000 | Freq: Every day | NASAL | 11 refills | Status: DC

## 2017-02-11 MED ORDER — AZELASTINE HCL 0.1 % NA SOLN: 2.0000 | Freq: Two times a day (BID) | NASAL | 11 refills | Status: DC

## 2017-02-11 MED ORDER — MONTELUKAST SODIUM 10 MG PO TABS: 10.0000 mg | ORAL_TABLET | Freq: Every day | ORAL | 5 refills | Status: DC

## 2017-02-11 NOTE — Progress Notes (Signed)
Subjective:    Patient ID: Mary Wyatt, female    DOB: Sep 04, 1986, 31 y.o.   MRN: [id number]  02/11/2017  Sinus Problem (x2 weeks; has a cough every now and then; denies any sneezing); Fatigue (feels run down); and Eye Pain (associated with the sinus issues)   HPI This 31 y.o. female presents for evaluation of sinus congestion.  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  Now having sinus pressure since moving back to [place].  Has undergone allergy testing that was negative; diagnosed with non-allergy rhinitis.  Has been using Astelin nasal spray; using allergy eye drop; taking Claritin daily; also taking Mucinex D.  Feels run down.  Brain fog.  Now has a new job; Glass blower/designer at [employer].  Feels mildly depressed.  Has been in job a little of a month.  This weekend, very sad; really fighting it.  Job is not what thought it would be.  No SI.   Has really struggled with Zoloft, Paxil, Lexapro.  Cymbalta.  Did become suicidal with Paxil.  Really hesitant to take antidepressants. Moved back in with parents.  Does have Xanax for really intense anxiety.       Review of Systems  Constitutional: Negative for chills, diaphoresis, fatigue and fever.  HENT: Positive for congestion, postnasal drip, rhinorrhea, sinus pain, sinus pressure and sneezing. Negative for ear pain, sore throat, trouble swallowing and voice change.   Eyes: Negative for visual disturbance.  Respiratory: Negative for cough and shortness of breath.   Cardiovascular: Negative for chest pain, palpitations and leg swelling.  Gastrointestinal: Negative for abdominal pain, constipation, diarrhea, nausea and vomiting.  Endocrine: Negative for cold intolerance, heat intolerance, polydipsia, polyphagia and polyuria.  Neurological: Negative for dizziness, tremors, seizures, syncope, facial asymmetry, speech difficulty, weakness, light-headedness, numbness and headaches.  Psychiatric/Behavioral: Positive for dysphoric mood.    Past  Medical History:  Diagnosis Date  . Allergy   . Anemia   . Anxiety   . Chest pain   . Depression   . Genital warts   . GERD (gastroesophageal reflux disease)   . Interstitial cystitis   . RMSF [facility] spotted fever) 2015  . Ulcer    Past Surgical History:  Procedure Laterality Date  . OVARIAN CYST REMOVAL    . TONSILLECTOMY     Allergies  Allergen Reactions  . Diflucan [Fluconazole] Hives    Social History   Social History  . Marital status: Single    Spouse name: N/A  . Number of children: 0  . Years of education: N/A   Occupational History  . [employer]    Social History Main Topics  . Smoking status: Former Research scientist (life sciences)  . Smokeless tobacco: Never Used  . Alcohol use 0.0 - 1.8 oz/week     Comment: Occas.  . Drug use: No  . Sexual activity: Yes    Birth control/ protection: Pill   Other Topics Concern  . Not on file   Social History Narrative      Marital status:  Single; parents in [place]: none      Employment: [employer] works indoors       Tobacco: none      Alcohol: socially; twice weekly      Drugs: none      Exercise: sporadic; walking sometimes         Daily caffeine    Family History  Problem Relation Age of Onset  . GI problems Sister   .  Asthma Sister   . Heart disease Maternal Grandmother   . Cancer Maternal Aunt        breast  . Colon cancer Neg Hx        Objective:    BP 110/69   Pulse 86   Temp 98 F (36.7 C) (Oral)   Resp 16   Ht 5' 5"  (1.651 m)   Wt 150 lb (68 kg)   SpO2 98%   BMI 24.96 kg/m  Physical Exam  Constitutional: She is oriented to person, place, and time. She appears well-developed and well-nourished. No distress.  HENT:  Head: Normocephalic and atraumatic.  Right Ear: External ear normal.  Left Ear: External ear normal.  Nose: Nose normal.  Mouth/Throat: Oropharynx is clear and moist.  Eyes: Conjunctivae and EOM are normal. Pupils are equal, round, and reactive to light.  Neck:  Normal range of motion. Neck supple. Carotid bruit is not present. No thyromegaly present.  Cardiovascular: Normal rate, regular rhythm, normal heart sounds and intact distal pulses.  Exam reveals no gallop and no friction rub.   No murmur heard. Pulmonary/Chest: Effort normal and breath sounds normal. She has no wheezes. She has no rales.  Abdominal: Soft. Bowel sounds are normal. She exhibits no distension and no mass. There is no tenderness. There is no rebound and no guarding.  Lymphadenopathy:    She has no cervical adenopathy.  Neurological: She is alert and oriented to person, place, and time. No cranial nerve deficit.  Skin: Skin is warm and dry. No rash noted. She is not diaphoretic. No erythema. No pallor.  Psychiatric: She has a normal mood and affect. Her behavior is normal. Judgment and thought content normal.        Assessment & Plan:   1. Seasonal allergic rhinitis due to pollen   2. Acute stress reaction   3. Generalized anxiety disorder   4. Celiac disease   5. Interstitial cystitis   6. Anal fissure    -uncontrolled allergic rhinitis; rx for Astelin, Flonase,and Singulair provided. -suffering with stress reaction due to recent move, new job, etc.  Rx for Xanax for sparing use; refer to psychology for counseling. -s/p GI consultation for ongoing anal pain; suspected anal fissure; also suspect underlying Celiac disease based on two previous EGD with bx.  Celiac panel however recently negative.   Orders Placed This Encounter  Procedures  . Ambulatory referral to Psychology    Referral Priority:   Routine    Referral Type:   Psychiatric    Referral Reason:   Specialty Services Required    Requested Specialty:   Psychology    Number of Visits Requested:   1   Meds ordered this encounter  Medications  . hydrocortisone (ANUSOL-HC) 25 MG suppository    Sig: PLACE 1 SUPPOSITORY (25 MG TOTAL) RECTALLY 2 (TWO) TIMES DAILY.    Refill:  0  . DISCONTD: azelastine  (ASTELIN) 0.1 % nasal spray    Sig: Place 2 sprays into both nostrils 2 (two) times daily. Use in each nostril as directed  . azelastine (ASTELIN) 0.1 % nasal spray    Sig: Place 2 sprays into both nostrils 2 (two) times daily.    Dispense:  30 mL    Refill:  11  . fluticasone (FLONASE) 50 MCG/ACT nasal spray    Sig: Place 2 sprays into both nostrils daily.    Dispense:  16 g    Refill:  11  . montelukast (SINGULAIR) 10 MG tablet  Sig: Take 1 tablet (10 mg total) by mouth at bedtime.    Dispense:  30 tablet    Refill:  5  . ALPRAZolam (XANAX) 0.5 MG tablet    Sig: Take 1 tablet (0.5 mg total) by mouth daily as needed for anxiety.    Dispense:  30 tablet    Refill:  0    Return in about 2 months (around 04/13/2017) for recheck anxiety, stress.   Toriana Sponsel Elayne Guerin, M.D. [facility] [address] [place], [place]  [address] [phone] phone [phone] fax

## 2017-02-11 NOTE — Patient Instructions (Addendum)
   IF you received an x-ray today, you will receive an invoice from [facility]. Please contact [facility] at [phone] with questions or concerns regarding your invoice.   IF you received labwork today, you will receive an invoice from [facility]. Please contact [facility] at [phone] with questions or concerns regarding your invoice.   Our billing staff will not be able to assist you with questions regarding bills from these companies.  You will be contacted with the lab results as soon as they are available. The fastest way to get your results is to activate your My Chart account. Instructions are located on the last page of this paperwork. If you have not heard from us regarding the results in 2 weeks, please contact this office.     Stress and Stress Management Stress is a normal reaction to life events. It is what you feel when life demands more than you are used to or more than you can handle. Some stress can be useful. For example, the stress reaction can help you catch the last bus of the day, study for a test, or meet a deadline at work. But stress that occurs too often or for too long can cause problems. It can affect your emotional health and interfere with relationships and normal daily activities. Too much stress can weaken your immune system and increase your risk for physical illness. If you already have a medical problem, stress can make it worse. What are the causes? All sorts of life events may cause stress. An event that causes stress for one person may not be stressful for another person. Major life events commonly cause stress. These may be positive or negative. Examples include losing your job, moving into a new home, getting married, having a baby, or losing a loved one. Less obvious life events may also cause stress, especially if they occur day after day or in combination. Examples include working long hours, driving in traffic, caring for children,  being in debt, or being in a difficult relationship. What are the signs or symptoms? Stress may cause emotional symptoms including, the following:  Anxiety. This is feeling worried, afraid, on edge, overwhelmed, or out of control.  Anger. This is feeling irritated or impatient.  Depression. This is feeling sad, down, helpless, or guilty.  Difficulty focusing, remembering, or making decisions. Stress may cause physical symptoms, including the following:  Aches and pains. These may affect your head, neck, back, stomach, or other areas of your body.  Tight muscles or clenched jaw.  Low energy or trouble sleeping. Stress may cause unhealthy behaviors, including the following:  Eating to feel better (overeating) or skipping meals.  Sleeping too little, too much, or both.  Working too much or putting off tasks (procrastination).  Smoking, drinking alcohol, or using drugs to feel better. How is this diagnosed? Stress is diagnosed through an assessment by your health care provider. Your health care provider will ask questions about your symptoms and any stressful life events.Your health care provider will also ask about your medical history and may order blood tests or other tests. Certain medical conditions and medicine can cause physical symptoms similar to stress. Mental illness can cause emotional symptoms and unhealthy behaviors similar to stress. Your health care provider may refer you to a mental health professional for further evaluation. How is this treated? Stress management is the recommended treatment for stress.The goals of stress management are reducing stressful life events and coping with stress in healthy ways. Techniques   for reducing stressful life events include the following:  Stress identification. Self-monitor for stress and identify what causes stress for you. These skills may help you to avoid some stressful events.  Time management. Set your priorities, keep a  calendar of events, and learn to say "no." These tools can help you avoid making too many commitments. Techniques for coping with stress include the following:  Rethinking the problem. Try to think realistically about stressful events rather than ignoring them or overreacting. Try to find the positives in a stressful situation rather than focusing on the negatives.  Exercise. Physical exercise can release both physical and emotional tension. The key is to find a form of exercise you enjoy and do it regularly.  Relaxation techniques. These relax the body and mind. Examples include yoga, meditation, tai chi, biofeedback, deep breathing, progressive muscle relaxation, listening to music, being out in nature, journaling, and other hobbies. Again, the key is to find one or more that you enjoy and can do regularly.  Healthy lifestyle. Eat a balanced diet, get plenty of sleep, and do not smoke. Avoid using alcohol or drugs to relax.  Strong support network. Spend time with family, friends, or other people you enjoy being around.Express your feelings and talk things over with someone you trust. Counseling or talktherapy with a mental health professional may be helpful if you are having difficulty managing stress on your own. Medicine is typically not recommended for the treatment of stress.Talk to your health care provider if you think you need medicine for symptoms of stress. Follow these instructions at home:  Keep all follow-up visits as directed by your health care provider.  Take all medicines as directed by your health care provider. Contact a health care provider if:  Your symptoms get worse or you start having new symptoms.  You feel overwhelmed by your problems and can no longer manage them on your own. Get help right away if:  You feel like hurting yourself or someone else. This information is not intended to replace advice given to you by your health care provider. Make sure you  discuss any questions you have with your health care provider. Document Released: 04/01/2001 Document Revised: 03/13/2016 Document Reviewed: 05/31/2013 Elsevier Interactive Patient Education  2017 Elsevier Inc.  

## 2017-02-14 ENCOUNTER — Encounter: Payer: Self-pay | Admitting: Family Medicine

## 2017-02-19 ENCOUNTER — Telehealth: Payer: Self-pay | Admitting: Family Medicine

## 2017-02-19 NOTE — Telephone Encounter (Signed)
Pt called back. She states the nasal sprays are not working for her & that she also sent a Pharmacist, community message. She would ike an antibiotic sent in for symptoms which have not improved. Please advise.

## 2017-02-19 NOTE — Telephone Encounter (Signed)
LMOV for pt to call back with details of symptoms

## 2017-02-19 NOTE — Telephone Encounter (Signed)
Pt is calling stating that she saw dr Tamala Julian recently and did not get an antibodic because she did think she needed it but she is still having pain and would like to get it called in now   Best number [phone]

## 2017-02-20 MED ORDER — AMOXICILLIN-POT CLAVULANATE 875-125 MG PO TABS: 1.0000 | ORAL_TABLET | Freq: Two times a day (BID) | ORAL | 0 refills | Status: DC

## 2017-02-20 NOTE — Addendum Note (Signed)
Addended by: Virgia Land on: 02/20/2017 04:40 PM   Modules accepted: Orders

## 2017-02-20 NOTE — Telephone Encounter (Signed)
Augmentin sent to pharmacy per Mychart message.

## 2017-03-17 ENCOUNTER — Encounter: Payer: Self-pay | Admitting: Family Medicine

## 2017-03-17 ENCOUNTER — Ambulatory Visit (INDEPENDENT_AMBULATORY_CARE_PROVIDER_SITE_OTHER): Payer: BC Managed Care – PPO | Admitting: Family Medicine

## 2017-03-17 VITALS — BP 112/73 | HR 73 | Temp 98.0°F | Resp 16 | Ht 65.0 in | Wt 145.4 lb

## 2017-03-17 DIAGNOSIS — R5383 Other fatigue: Secondary | ICD-10-CM

## 2017-03-17 DIAGNOSIS — M545 Low back pain, unspecified: Secondary | ICD-10-CM

## 2017-03-17 DIAGNOSIS — R590 Localized enlarged lymph nodes: Secondary | ICD-10-CM

## 2017-03-17 DIAGNOSIS — J301 Allergic rhinitis due to pollen: Secondary | ICD-10-CM

## 2017-03-17 DIAGNOSIS — S335XXA Sprain of ligaments of lumbar spine, initial encounter: Secondary | ICD-10-CM

## 2017-03-17 LAB — POCT URINALYSIS DIP (MANUAL ENTRY)
BILIRUBIN UA: NEGATIVE
BILIRUBIN UA: NEGATIVE mg/dL
Glucose, UA: NEGATIVE mg/dL
Leukocytes, UA: NEGATIVE
Nitrite, UA: NEGATIVE
Protein Ur, POC: NEGATIVE mg/dL
SPEC GRAV UA: 1.01 (ref 1.010–1.025)
Urobilinogen, UA: 0.2 E.U./dL
pH, UA: 6 (ref 5.0–8.0)

## 2017-03-17 LAB — POCT RAPID STREP A (OFFICE): Rapid Strep A Screen: NEGATIVE

## 2017-03-17 MED ORDER — METHOCARBAMOL 500 MG PO TABS: 500.0000 mg | ORAL_TABLET | Freq: Three times a day (TID) | ORAL | 0 refills | Status: DC | PRN

## 2017-03-17 MED ORDER — MELOXICAM 7.5 MG PO TABS: 7.5000 mg | ORAL_TABLET | Freq: Every day | ORAL | 0 refills | Status: DC

## 2017-03-17 NOTE — Progress Notes (Signed)
Subjective:    Patient ID: Mary Wyatt, female    DOB: [birth year], 31 y.o.   MRN: [id number]  03/17/2017  Lymphadenopathy (x 1 week , low back pain x 22mhs off/on)   HPI This 31y.o. female presents for evaluation of swelling of lymph nodes and low back pain for three months.  One week ago, noticed L anterior swollen cervical neck LAD.  Has decreased in size.  Absolutely exhausted.  Work is going better.  Had a productive conversation and sLibrarian, academic  Feels exhausted and run down and sleepy.  Stress has improved at work.  Has a new chair at work that is ergonomic.  Lower back pain: having terrible lower back pain.  Had bad back pain; hard to get out of bed; difficult to walk.  Thinks may be restraining lower back. No specific over-exertion.  With any repeat exertion, pain will occur.  Has bene moving and pain keeps recurring.  No radiation into legs; no n/t/burning in legs. No leg weakness. No saddle paresthesias except after sitting funny.  Normal bowel and bladder dysfunction.  Still has anal fissure. Urinary issues persists.  Onset three months ago; not taking anything except for an Aleve once when moved.  Does not like to take medication.  No heat or ice.  Having lower back pain 4-5 days at a time; then takes it easy and resolves; when lifts again, will recur and starts all over.    One positive: has lost ten pounds from walking around campus which is great.  Treated with abx with significant improvement prescribed Augmentin 02/20/17.  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  No previous sleep study; both parents just completed and dx with OSA with CpAP.  Some walking and some exercise bike.  Back pain started before moving. Started with a really bad period.  Gerting out of  Not sexually active.    Wt Readings from Last 3 Encounters:  03/17/17 145 lb 6.4 oz (66 kg)  02/11/17 150 lb (68 kg)  01/01/17 152 lb 8 oz (69.2 kg)   BP Readings from Last 3 Encounters:  03/17/17 112/73    02/11/17 110/69  01/01/17 92/72   Immunization History  Administered Date(s) Administered  . Hepatitis A, Adult 05/31/2014, 11/30/2014  . Hepatitis B, adult 05/31/2014, 06/28/2014, 11/30/2014  . Influenza,inj,Quad PF,36+ Mos 07/21/2013, 06/28/2014  . Influenza-Unspecified 07/11/2015, 08/24/2016  . Tdap 03/03/2012     Review of Systems  Constitutional: Positive for fever. Negative for chills, diaphoresis and fatigue.  HENT: Negative for ear pain, postnasal drip, rhinorrhea, sinus pressure, sore throat and trouble swallowing.   Eyes: Negative for visual disturbance.  Respiratory: Negative for cough and shortness of breath.   Cardiovascular: Negative for chest pain, palpitations and leg swelling.  Gastrointestinal: Positive for anal bleeding and rectal pain. Negative for abdominal pain, constipation, diarrhea, nausea and vomiting.  Endocrine: Negative for cold intolerance, heat intolerance, polydipsia, polyphagia and polyuria.  Genitourinary: Negative for decreased urine volume and difficulty urinating.  Musculoskeletal: Positive for back pain and myalgias.  Neurological: Negative for dizziness, tremors, seizures, syncope, facial asymmetry, speech difficulty, weakness, light-headedness, numbness and headaches.  Hematological: Positive for adenopathy.    Past Medical History:  Diagnosis Date  . Allergy   . Anemia   . Anxiety   . Chest pain   . Depression   . Genital warts   . GERD (gastroesophageal reflux disease)   . Interstitial cystitis   . [facility]  .  Ulcer    Past Surgical History:  Procedure Laterality Date  . OVARIAN CYST REMOVAL    . TONSILLECTOMY     Allergies  Allergen Reactions  . Diflucan [Fluconazole] Hives    Social History   Social History  . Marital status: Single    Spouse name: N/A  . Number of children: 0  . Years of education: N/A   Occupational History  . [employer]    Social History Main Topics  . Smoking  status: Former Research scientist (life sciences)  . Smokeless tobacco: Never Used  . Alcohol use 0.0 - 1.8 oz/week     Comment: Occas.  . Drug use: No  . Sexual activity: Yes    Birth control/ protection: Pill   Other Topics Concern  . Not on file   Social History Narrative      Marital status:  Single; parents in [place]: none      Employment: [place] [employer] works indoors       Tobacco: none      Alcohol: socially; twice weekly      Drugs: none      Exercise: sporadic; walking sometimes         Daily caffeine    Family History  Problem Relation Age of Onset  . GI problems Sister   . Asthma Sister   . Heart disease Maternal Grandmother   . Cancer Maternal Aunt        breast  . Colon cancer Neg Hx        Objective:    BP 112/73 (BP Location: Right Arm, Patient Position: Sitting, Cuff Size: Small)   Pulse 73   Temp 98 F (36.7 C) (Oral)   Resp 16   Ht 5' 5"  (1.651 m)   Wt 145 lb 6.4 oz (66 kg)   LMP 03/15/2017   SpO2 98%   BMI 24.20 kg/m  Physical Exam  Constitutional: She is oriented to person, place, and time. She appears well-developed and well-nourished. No distress.  HENT:  Head: Normocephalic and atraumatic.  Right Ear: Tympanic membrane, external ear and ear canal normal.  Left Ear: Tympanic membrane, external ear and ear canal normal.  Nose: Mucosal edema and rhinorrhea present. Right sinus exhibits maxillary sinus tenderness. Right sinus exhibits no frontal sinus tenderness. Left sinus exhibits no maxillary sinus tenderness and no frontal sinus tenderness.  Mouth/Throat: Oropharynx is clear and moist.  Eyes: Conjunctivae and EOM are normal. Pupils are equal, round, and reactive to light.  Neck: Normal range of motion. Neck supple. Carotid bruit is not present. No thyromegaly present.  Cardiovascular: Normal rate, regular rhythm, normal heart sounds and intact distal pulses.  Exam reveals no gallop and no friction rub.   No murmur heard. Pulmonary/Chest: Effort  normal and breath sounds normal. She has no wheezes. She has no rales.  Abdominal: Soft. Bowel sounds are normal. She exhibits no distension and no mass. There is no tenderness. There is no rebound and no guarding.  Musculoskeletal:       Lumbar back: She exhibits pain. She exhibits normal range of motion, no tenderness, no bony tenderness, no spasm and normal pulse.  Lumbar spine:  Non-tender midline; non-tender paraspinal regions B.  Straight leg raises negative B; toe and heel walking intact; marching intact; motor 5/5 BLE.  Full ROM lumbar spine without limitation.   Lymphadenopathy:    She has cervical adenopathy.       Left cervical: Superficial cervical adenopathy present.  Neurological:  She is alert and oriented to person, place, and time. No cranial nerve deficit.  Skin: Skin is warm and dry. No rash noted. She is not diaphoretic. No erythema. No pallor.  Psychiatric: She has a normal mood and affect. Her behavior is normal.  Nursing note and vitals reviewed.       Assessment & Plan:   1. Left cervical lymphadenopathy   2. Seasonal allergic rhinitis due to pollen   3. Other fatigue   4. Acute bilateral low back pain without sciatica   5. Lumbar sprain, initial encounter    -new onset L anterior cervical LAD with intermittent fatigue; obtain strep throat cx and rapid strep; obtain CBC and EBV titers.  Recommend rest,fluids, Tylenol. -uncontrolled allergic rhinitis; continue with nasal steroid. New onset lower back pain; treat with Robaxin and Mobic. Home exercise program provided; consider formal physical therapy and imaging if no improvement in one month.   Orders Placed This Encounter  Procedures  . Culture, Group A Strep    Order Specific Question:   Source    Answer:   oropharynx  . CBC with Differential/Platelet  . Comprehensive metabolic panel  . TSH  . VITAMIN D 25 Hydroxy (Vit-D Deficiency, Fractures)  . Vitamin B12  . Epstein-Barr virus VCA antibody panel  .  POCT urinalysis dipstick  . POCT rapid strep A   Meds ordered this encounter  Medications  . methocarbamol (ROBAXIN) 500 MG tablet    Sig: Take 1-2 tablets (500-1,000 mg total) by mouth every 8 (eight) hours as needed for muscle spasms.    Dispense:  30 tablet    Refill:  0  . meloxicam (MOBIC) 7.5 MG tablet    Sig: Take 1 tablet (7.5 mg total) by mouth daily.    Dispense:  30 tablet    Refill:  0    No Follow-up on file.   Patric Vanpelt Elayne Guerin, M.D. Primary Care at [facility] [address] [place], [place]  [address] [phone] phone [phone] fax

## 2017-03-17 NOTE — Patient Instructions (Addendum)
IF you received an x-ray today, you will receive an invoice from [facility]. Please contact [facility] at [phone] with questions or concerns regarding your invoice.   IF you received labwork today, you will receive an invoice from [facility]. Please contact [facility] at [phone] with questions or concerns regarding your invoice.   Our billing staff will not be able to assist you with questions regarding bills from these companies.  You will be contacted with the lab results as soon as they are available. The fastest way to get your results is to activate your My Chart account. Instructions are located on the last page of this paperwork. If you have not heard from [place] regarding the results in 2 weeks, please contact this office.     Low Back Sprain Rehab Ask your health care provider which exercises are safe for you. Do exercises exactly as told by your health care provider and adjust them as directed. It is normal to feel mild stretching, pulling, tightness, or discomfort as you do these exercises, but you should stop right away if you feel sudden pain or your pain gets worse. Do not begin these exercises until told by your health care provider. Stretching and range of motion exercises These exercises warm up your muscles and joints and improve the movement and flexibility of your back. These exercises also help to relieve pain, numbness, and tingling. Exercise A: Lumbar rotation   1. Lie on your back on a firm surface and bend your knees. 2. Straighten your arms out to your sides so each arm forms an "L" shape with a side of your body (a 90 degree angle). 3. Slowly move both of your knees to one side of your body until you feel a stretch in your lower back. Try not to let your shoulders move off of the floor. 4. Hold for __________ seconds. 5. Tense your abdominal muscles and slowly move your knees back to the starting position. 6. Repeat this exercise on the  other side of your body. Repeat __________ times. Complete this exercise __________ times a day. Exercise B: Prone extension on elbows   1. Lie on your abdomen on a firm surface. 2. Prop yourself up on your elbows. 3. Use your arms to help lift your chest up until you feel a gentle stretch in your abdomen and your lower back.  This will place some of your body weight on your elbows. If this is uncomfortable, try stacking pillows under your chest.  Your hips should stay down, against the surface that you are lying on. Keep your hip and back muscles relaxed. 4. Hold for __________ seconds. 5. Slowly relax your upper body and return to the starting position. Repeat __________ times. Complete this exercise __________ times a day. Strengthening exercises These exercises build strength and endurance in your back. Endurance is the ability to use your muscles for a long time, even after they get tired. Exercise C: Pelvic tilt  1. Lie on your back on a firm surface. Bend your knees and keep your feet flat. 2. Tense your abdominal muscles. Tip your pelvis up toward the ceiling and flatten your lower back into the floor.  To help with this exercise, you may place a small towel under your lower back and try to push your back into the towel. 3. Hold for __________ seconds. 4. Let your muscles relax completely before you repeat this exercise. Repeat __________ times. Complete this exercise __________ times a day. Exercise  D: Alternating arm and leg raises   1. Get on your hands and knees on a firm surface. If you are on a hard floor, you may want to use padding to cushion your knees, such as an exercise mat. 2. Line up your arms and legs. Your hands should be below your shoulders, and your knees should be below your hips. 3. Lift your left leg behind you. At the same time, raise your right arm and straighten it in front of you.  Do not lift your leg higher than your hip.  Do not lift your arm  higher than your shoulder.  Keep your abdominal and back muscles tight.  Keep your hips facing the ground.  Do not arch your back.  Keep your balance carefully, and do not hold your breath. 4. Hold for __________ seconds. 5. Slowly return to the starting position and repeat with your right leg and your left arm. Repeat __________ times. Complete this exercise __________ times a day. Exercise E: Abdominal set with straight leg raise   1. Lie on your back on a firm surface. 2. Bend one of your knees and keep your other leg straight. 3. Tense your abdominal muscles and lift your straight leg up, 4-6 inches (10-15 cm) off the ground. 4. Keep your abdominal muscles tight and hold for __________ seconds.  Do not hold your breath.  Do not arch your back. Keep it flat against the ground. 5. Keep your abdominal muscles tense as you slowly lower your leg back to the starting position. 6. Repeat with your other leg. Repeat __________ times. Complete this exercise __________ times a day. Posture and body mechanics   Body mechanics refers to the movements and positions of your body while you do your daily activities. Posture is part of body mechanics. Good posture and healthy body mechanics can help to relieve stress in your body's tissues and joints. Good posture means that your spine is in its natural S-curve position (your spine is neutral), your shoulders are pulled back slightly, and your head is not tipped forward. The following are general guidelines for applying improved posture and body mechanics to your everyday activities. Standing    When standing, keep your spine neutral and your feet about hip-width apart. Keep a slight bend in your knees. Your ears, shoulders, and hips should line up.  When you do a task in which you stand in one place for a long time, place one foot up on a stable object that is 2-4 inches (5-10 cm) high, such as a footstool. This helps keep your spine  neutral. Sitting    When sitting, keep your spine neutral and keep your feet flat on the floor. Use a footrest, if necessary, and keep your thighs parallel to the floor. Avoid rounding your shoulders, and avoid tilting your head forward.  When working at a desk or a computer, keep your desk at a height where your hands are slightly lower than your elbows. Slide your chair under your desk so you are close enough to maintain good posture.  When working at a computer, place your monitor at a height where you are looking straight ahead and you do not have to tilt your head forward or downward to look at the screen. Resting    When lying down and resting, avoid positions that are most painful for you.  If you have pain with activities such as sitting, bending, stooping, or squatting (flexion-based activities), lie in a position in which  your body does not bend very much. For example, avoid curling up on your side with your arms and knees near your chest (fetal position).  If you have pain with activities such as standing for a long time or reaching with your arms (extension-based activities), lie with your spine in a neutral position and bend your knees slightly. Try the following positions:  Lying on your side with a pillow between your knees.  Lying on your back with a pillow under your knees. Lifting    When lifting objects, keep your feet at least shoulder-width apart and tighten your abdominal muscles.  Bend your knees and hips and keep your spine neutral. It is important to lift using the strength of your legs, not your back. Do not lock your knees straight out.  Always ask for help to lift heavy or awkward objects. This information is not intended to replace advice given to you by your health care provider. Make sure you discuss any questions you have with your health care provider. Document Released: 10/06/2005 Document Revised: 06/12/2016 Document Reviewed: 07/18/2015 Elsevier  Interactive Patient Education  2017 [employer].

## 2017-03-18 LAB — CBC WITH DIFFERENTIAL/PLATELET
BASOS: 1 %
Basophils Absolute: 0 10*3/uL (ref 0.0–0.2)
EOS (ABSOLUTE): 0.1 10*3/uL (ref 0.0–0.4)
EOS: 2 %
HEMATOCRIT: 38.3 % (ref 34.0–46.6)
Hemoglobin: 13.1 g/dL (ref 11.1–15.9)
Immature Grans (Abs): 0 10*3/uL (ref 0.0–0.1)
Immature Granulocytes: 0 %
LYMPHS ABS: 1.6 10*3/uL (ref 0.7–3.1)
Lymphs: 38 %
MCH: 31.9 pg (ref 26.6–33.0)
MCHC: 34.2 g/dL (ref 31.5–35.7)
MCV: 93 fL (ref 79–97)
Monocytes Absolute: 0.3 10*3/uL (ref 0.1–0.9)
Monocytes: 6 %
Neutrophils Absolute: 2.1 10*3/uL (ref 1.4–7.0)
Neutrophils: 53 %
Platelets: 245 10*3/uL (ref 150–379)
RBC: 4.11 x10E6/uL (ref 3.77–5.28)
RDW: 13.1 % (ref 12.3–15.4)
WBC: 4.1 10*3/uL (ref 3.4–10.8)

## 2017-03-18 LAB — COMPREHENSIVE METABOLIC PANEL
A/G RATIO: 2 (ref 1.2–2.2)
ALT: 18 IU/L (ref 0–32)
AST: 18 IU/L (ref 0–40)
Albumin: 4.6 g/dL (ref 3.5–5.5)
Alkaline Phosphatase: 42 IU/L (ref 39–117)
BUN / CREAT RATIO: 15 (ref 9–23)
BUN: 9 mg/dL (ref 6–20)
Bilirubin Total: 0.3 mg/dL (ref 0.0–1.2)
CALCIUM: 9.2 mg/dL (ref 8.7–10.2)
CO2: 21 mmol/L (ref 18–29)
Chloride: 103 mmol/L (ref 96–106)
Creatinine, Ser: 0.6 mg/dL (ref 0.57–1.00)
GFR calc Af Amer: 139 mL/min/{1.73_m2} (ref >59)
GFR, EST NON AFRICAN AMERICAN: 120 mL/min/{1.73_m2} (ref >59)
GLOBULIN, TOTAL: 2.3 g/dL (ref 1.5–4.5)
Glucose: 95 mg/dL (ref 65–99)
POTASSIUM: 4.5 mmol/L (ref 3.5–5.2)
SODIUM: 138 mmol/L (ref 134–144)
Total Protein: 6.9 g/dL (ref 6.0–8.5)

## 2017-03-18 LAB — TSH: TSH: 2.19 u[IU]/mL (ref 0.450–4.500)

## 2017-03-18 LAB — EPSTEIN-BARR VIRUS VCA ANTIBODY PANEL
EBV Early Antigen Ab, IgG: >150 U/mL — ABNORMAL HIGH (ref 0.0–8.9)
EBV NA IGG: 524 U/mL — AB (ref 0.0–17.9)
EBV VCA IgM: <36 U/mL (ref 0.0–35.9)

## 2017-03-18 LAB — VITAMIN D 25 HYDROXY (VIT D DEFICIENCY, FRACTURES): VIT D 25 HYDROXY: 44.1 ng/mL (ref 30.0–100.0)

## 2017-03-18 LAB — VITAMIN B12: Vitamin B-12: 374 pg/mL (ref 232–1245)

## 2017-03-19 LAB — CULTURE, GROUP A STREP: Strep A Culture: NEGATIVE

## 2017-03-26 ENCOUNTER — Ambulatory Visit (INDEPENDENT_AMBULATORY_CARE_PROVIDER_SITE_OTHER): Payer: BC Managed Care – PPO | Admitting: Licensed Clinical Social Worker

## 2017-03-26 DIAGNOSIS — F331 Major depressive disorder, recurrent, moderate: Secondary | ICD-10-CM

## 2017-04-13 ENCOUNTER — Other Ambulatory Visit: Payer: Self-pay | Admitting: Family Medicine

## 2017-04-14 ENCOUNTER — Ambulatory Visit (INDEPENDENT_AMBULATORY_CARE_PROVIDER_SITE_OTHER): Payer: BC Managed Care – PPO | Admitting: Licensed Clinical Social Worker

## 2017-04-14 DIAGNOSIS — F331 Major depressive disorder, recurrent, moderate: Secondary | ICD-10-CM | POA: Diagnosis not present

## 2017-04-15 ENCOUNTER — Encounter: Payer: Self-pay | Admitting: Family Medicine

## 2017-04-15 ENCOUNTER — Ambulatory Visit (INDEPENDENT_AMBULATORY_CARE_PROVIDER_SITE_OTHER): Payer: BC Managed Care – PPO | Admitting: Family Medicine

## 2017-04-15 VITALS — BP 113/71 | HR 68 | Temp 98.0°F | Resp 18 | Ht 65.75 in | Wt 147.0 lb

## 2017-04-15 DIAGNOSIS — Z8249 Family history of ischemic heart disease and other diseases of the circulatory system: Secondary | ICD-10-CM

## 2017-04-15 DIAGNOSIS — G44209 Tension-type headache, unspecified, not intractable: Secondary | ICD-10-CM

## 2017-04-15 DIAGNOSIS — F411 Generalized anxiety disorder: Secondary | ICD-10-CM | POA: Diagnosis not present

## 2017-04-15 DIAGNOSIS — F4323 Adjustment disorder with mixed anxiety and depressed mood: Secondary | ICD-10-CM

## 2017-04-15 DIAGNOSIS — N3010 Interstitial cystitis (chronic) without hematuria: Secondary | ICD-10-CM

## 2017-04-15 DIAGNOSIS — J301 Allergic rhinitis due to pollen: Secondary | ICD-10-CM

## 2017-04-15 MED ORDER — FLUOXETINE HCL 20 MG PO TABS: 20.0000 mg | ORAL_TABLET | Freq: Every day | ORAL | 3 refills | Status: DC

## 2017-04-15 NOTE — Patient Instructions (Signed)
     IF you received an x-ray today, you will receive an invoice from [facility]. Please contact [facility] at [phone] with questions or concerns regarding your invoice.   IF you received labwork today, you will receive an invoice from [facility]. Please contact [facility] at [phone] with questions or concerns regarding your invoice.   Our billing staff will not be able to assist you with questions regarding bills from these companies.  You will be contacted with the lab results as soon as they are available. The fastest way to get your results is to activate your My Chart account. Instructions are located on the last page of this paperwork. If you have not heard from us regarding the results in 2 weeks, please contact this office.     

## 2017-04-15 NOTE — Progress Notes (Signed)
Subjective:    Patient ID: Mary Wyatt, female    DOB: [birth year], 31 y.o.   MRN: [id number]  04/15/2017  Depression (1 month follow up)   HPI This 31 y.o. female presents for evaluation of depression/anxiety.  Seeing a therapist; has gone 2 times thus far.  Feeling worse emotionally; really miserable with current employment.  Has job interview coming up. Very intolerant to difficult coworkers; same issues at last job.  Good local support from family.  No interest in social events; struggling to form relationships right now due to depressed mood. Now willing try medication.   Almy[facility] [place]  Headaches: having headaches; started on Friday on LEFT; then moved to L periorbital region; now sinus regino.  Really unusual.  No congestion.  Using two nasal sprays and allergy pill.  No rhinorrhea; having PND.  Intermittent nasal congestion but not much.  Mother had brain aneurysm so headaches freak patient out.  Took Tylenol without relief; took muscle relaxer and resolved.  Took BC powder today and did not help.   Severity 5/10.  Annoying pain.   No fever/chills/sweats.  No photophobia.  History of migraines; definitely not a migraine.    Hates job; hated last job.  Therapist called pt out yesterday.  Cannot tolerate rude coworkers and neediness.   Works with difficult people.  Manage up/down/across.  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  Worried about being bipolar; admits to episodes of heightened mood.  No SI.    Zoloft --- soon after trying it, started crying at school Lexapro ---- noticed the least; just not effective? Paxil ---  Not right on Paxil; had bad withdrawal symptoms; suicidal Cymbalta --- tried because of weird pain; took one pill and could not sleep; sl uncomfortable; really stimulated.   Aunt takes Prozac; mom takes something really simple.   No Effexor. No Wellbutrin.  BP Readings from Last 3 Encounters:  04/15/17 113/71    03/17/17 112/73  02/11/17 110/69   Wt Readings from Last 3 Encounters:  04/15/17 147 lb (66.7 kg)  03/17/17 145 lb 6.4 oz (66 kg)  02/11/17 150 lb (68 kg)   Immunization History  Administered Date(s) Administered  . Hepatitis A, Adult 05/31/2014, 11/30/2014  . Hepatitis B, adult 05/31/2014, 06/28/2014, 11/30/2014  . Influenza,inj,Quad PF,36+ Mos 07/21/2013, 06/28/2014  . Influenza-Unspecified 07/11/2015, 08/24/2016  . Tdap 03/03/2012    Review of Systems  Constitutional: Negative for chills, diaphoresis, fatigue and fever.  HENT: Negative for congestion, postnasal drip, rhinorrhea, sinus pain and sore throat.   Eyes: Negative for photophobia, pain, redness and visual disturbance.  Respiratory: Negative for cough and shortness of breath.   Cardiovascular: Negative for chest pain, palpitations and leg swelling.  Gastrointestinal: Negative for abdominal pain, constipation, diarrhea, nausea and vomiting.  Endocrine: Negative for cold intolerance, heat intolerance, polydipsia, polyphagia and polyuria.  Neurological: Positive for headaches. Negative for dizziness, tremors, seizures, syncope, facial asymmetry, speech difficulty, weakness, light-headedness and numbness.  Psychiatric/Behavioral: Positive for decreased concentration and dysphoric mood. Negative for self-injury, sleep disturbance and suicidal ideas. The patient is nervous/anxious.     Past Medical History:  Diagnosis Date  . Allergy   . Anemia   . Anxiety   . Chest pain   . Depression   . Genital warts   . GERD (gastroesophageal reflux disease)   . Interstitial cystitis   . RMSF [facility] spotted fever) 2015  . Ulcer    Past Surgical History:  Procedure Laterality Date  .  OVARIAN CYST REMOVAL    . TONSILLECTOMY     Allergies  Allergen Reactions  . Diflucan [Fluconazole] Hives    Social History   Social History  . Marital status: Single    Spouse name: N/A  . Number of children: 0  . Years of  education: N/A   Occupational History  . [employer]    Social History Main Topics  . Smoking status: Former Research scientist (life sciences)  . Smokeless tobacco: Never Used  . Alcohol use 0.0 - 1.8 oz/week     Comment: Occas.  . Drug use: No  . Sexual activity: Yes    Birth control/ protection: Pill   Other Topics Concern  . Not on file   Social History Narrative      Marital status:  Single; parents in [place]: none      Employment: [place] [employer] works indoors       Tobacco: none      Alcohol: socially; twice weekly      Drugs: none      Exercise: sporadic; walking sometimes         Daily caffeine    Family History  Problem Relation Age of Onset  . GI problems Sister   . Asthma Sister   . Heart disease Maternal Grandmother   . Cancer Maternal Aunt        breast  . Colon cancer Neg Hx        Objective:    BP 113/71   Pulse 68   Temp 98 F (36.7 C) (Oral)   Resp 18   Ht 5' 5.75" (1.67 m)   Wt 147 lb (66.7 kg)   SpO2 98%   BMI 23.91 kg/m  Physical Exam  Constitutional: She is oriented to person, place, and time. She appears well-developed and well-nourished. No distress.  HENT:  Head: Normocephalic and atraumatic.  Right Ear: External ear normal.  Left Ear: External ear normal.  Nose: Nose normal.  Mouth/Throat: Oropharynx is clear and moist.  Eyes: Conjunctivae and EOM are normal. Pupils are equal, round, and reactive to light.  Neck: Normal range of motion. Neck supple. Carotid bruit is not present. No thyromegaly present.  Cardiovascular: Normal rate, regular rhythm, normal heart sounds and intact distal pulses.  Exam reveals no gallop and no friction rub.   No murmur heard. Pulmonary/Chest: Effort normal and breath sounds normal. She has no wheezes. She has no rales.  Abdominal: Soft. Bowel sounds are normal. She exhibits no distension and no mass. There is no tenderness. There is no rebound and no guarding.  Lymphadenopathy:    She has no cervical adenopathy.    Neurological: She is alert and oriented to person, place, and time. No cranial nerve deficit.  Skin: Skin is warm and dry. No rash noted. She is not diaphoretic. No erythema. No pallor.  Psychiatric: She has a normal mood and affect. Her behavior is normal.   Results for orders placed or performed in visit on 03/17/17  Culture, Group A Strep  Result Value Ref Range   Strep A Culture Negative   CBC with Differential/Platelet  Result Value Ref Range   WBC 4.1 3.4 - 10.8 x10E3/uL   RBC 4.11 3.77 - 5.28 x10E6/uL   Hemoglobin 13.1 11.1 - 15.9 g/dL   Hematocrit 38.3 34.0 - 46.6 %   MCV 93 79 - 97 fL   MCH 31.9 26.6 - 33.0 pg   MCHC 34.2 31.5 - 35.7 g/dL  RDW 13.1 12.3 - 15.4 %   Platelets 245 150 - 379 x10E3/uL   Neutrophils 53 Not Estab. %   Lymphs 38 Not Estab. %   Monocytes 6 Not Estab. %   Eos 2 Not Estab. %   Basos 1 Not Estab. %   Neutrophils Absolute 2.1 1.4 - 7.0 x10E3/uL   Lymphocytes Absolute 1.6 0.7 - 3.1 x10E3/uL   Monocytes Absolute 0.3 0.1 - 0.9 x10E3/uL   EOS (ABSOLUTE) 0.1 0.0 - 0.4 x10E3/uL   Basophils Absolute 0.0 0.0 - 0.2 x10E3/uL   Immature Granulocytes 0 Not Estab. %   Immature Grans (Abs) 0.0 0.0 - 0.1 x10E3/uL  Comprehensive metabolic panel  Result Value Ref Range   Glucose 95 65 - 99 mg/dL   BUN 9 6 - 20 mg/dL   Creatinine, Ser 0.60 0.57 - 1.00 mg/dL   GFR calc non Af Amer 120 >59 mL/min/1.73   GFR calc Af Amer 139 >59 mL/min/1.73   BUN/Creatinine Ratio 15 9 - 23   Sodium 138 134 - 144 mmol/L   Potassium 4.5 3.5 - 5.2 mmol/L   Chloride 103 96 - 106 mmol/L   CO2 21 18 - 29 mmol/L   Calcium 9.2 8.7 - 10.2 mg/dL   Total Protein 6.9 6.0 - 8.5 g/dL   Albumin 4.6 3.5 - 5.5 g/dL   Globulin, Total 2.3 1.5 - 4.5 g/dL   Albumin/Globulin Ratio 2.0 1.2 - 2.2   Bilirubin Total 0.3 0.0 - 1.2 mg/dL   Alkaline Phosphatase 42 39 - 117 IU/L   AST 18 0 - 40 IU/L   ALT 18 0 - 32 IU/L  TSH  Result Value Ref Range   TSH 2.190 0.450 - 4.500 uIU/mL  VITAMIN D 25  Hydroxy (Vit-D Deficiency, Fractures)  Result Value Ref Range   Vit D, 25-Hydroxy 44.1 30.0 - 100.0 ng/mL  Vitamin B12  Result Value Ref Range   Vitamin B-12 374 232 - 1,245 pg/mL  Epstein-Barr virus VCA antibody panel  Result Value Ref Range   EBV VCA IgM <36.0 0.0 - 35.9 U/mL   EBV Early Antigen Ab, IgG >150.0 (H) 0.0 - 8.9 U/mL   EBV VCA IgG >600.0 (H) 0.0 - 17.9 U/mL   EBV NA IgG 524.0 (H) 0.0 - 17.9 U/mL   Interpretation: Comment   POCT urinalysis dipstick  Result Value Ref Range   Color, UA yellow yellow   Clarity, UA clear clear   Glucose, UA negative negative mg/dL   Bilirubin, UA negative negative   Ketones, POC UA negative negative mg/dL   Spec Grav, UA 1.010 1.010 - 1.025   Blood, UA trace-lysed (A) negative   pH, UA 6.0 5.0 - 8.0   Protein Ur, POC negative negative mg/dL   Urobilinogen, UA 0.2 0.2 or 1.0 E.U./dL   Nitrite, UA Negative Negative   Leukocytes, UA Negative Negative  POCT rapid strep A  Result Value Ref Range   Rapid Strep A Screen Negative Negative       Assessment & Plan:   1. Generalized anxiety disorder   2. Interstitial cystitis   3. Seasonal allergic rhinitis due to pollen   4. Adjustment disorder with mixed anxiety and depressed mood   5. Tension headache   6. Family history of brain aneurysm    -worsening anxiety and depression.  Rx for Prozac 63m daily; refer to psychiatry for further evaluation for bipolar disorder; continue psychotherapy. -new onset headaches suggestive of tension headaches;no migraine features and not as  severe; treat anxiety and depression and reevaluate headaches.   -allergies stable yet ongoing; swollen cervical LAD has resolved. -prolonged face-to-face for 30 minutes with greater than 50% of time dedicated to counseling and coordination of care.   Orders Placed This Encounter  Procedures  . Ambulatory referral to Psychiatry    Referral Priority:   Routine    Referral Type:   Psychiatric    Referral Reason:    Specialty Services Required    Requested Specialty:   Psychiatry    Number of Visits Requested:   1   Meds ordered this encounter  Medications  . FLUoxetine (PROZAC) 20 MG tablet    Sig: Take 1 tablet (20 mg total) by mouth daily.    Dispense:  30 tablet    Refill:  3    Return in about 6 weeks (around 05/27/2017) for recheck anxiety/depression.   Elhadj Girton Elayne Guerin, M.D. Primary Care at [facility] previously [facility] [address] [place], [place]  [address] [phone] phone [phone] fax

## 2017-04-19 DIAGNOSIS — Z8249 Family history of ischemic heart disease and other diseases of the circulatory system: Secondary | ICD-10-CM | POA: Insufficient documentation

## 2017-04-24 ENCOUNTER — Ambulatory Visit: Payer: BC Managed Care – PPO | Admitting: Licensed Clinical Social Worker

## 2017-05-12 ENCOUNTER — Ambulatory Visit: Payer: BC Managed Care – PPO | Admitting: Licensed Clinical Social Worker

## 2017-05-27 ENCOUNTER — Ambulatory Visit (INDEPENDENT_AMBULATORY_CARE_PROVIDER_SITE_OTHER): Payer: BC Managed Care – PPO | Admitting: Family Medicine

## 2017-05-27 ENCOUNTER — Encounter: Payer: Self-pay | Admitting: Family Medicine

## 2017-05-27 VITALS — BP 120/68 | HR 65 | Temp 98.5°F | Resp 18 | Ht 65.75 in | Wt 141.0 lb

## 2017-05-27 DIAGNOSIS — F329 Major depressive disorder, single episode, unspecified: Secondary | ICD-10-CM

## 2017-05-27 DIAGNOSIS — K900 Celiac disease: Secondary | ICD-10-CM

## 2017-05-27 DIAGNOSIS — N3010 Interstitial cystitis (chronic) without hematuria: Secondary | ICD-10-CM

## 2017-05-27 DIAGNOSIS — K602 Anal fissure, unspecified: Secondary | ICD-10-CM | POA: Diagnosis not present

## 2017-05-27 DIAGNOSIS — F419 Anxiety disorder, unspecified: Secondary | ICD-10-CM

## 2017-05-27 DIAGNOSIS — F32A Depression, unspecified: Secondary | ICD-10-CM

## 2017-05-27 NOTE — Progress Notes (Signed)
Subjective:    Patient ID: Mary Wyatt, female    DOB: 17-May-1986, 31 y.o.   MRN: [id number]  05/27/2017  Anxiety (6 week follow-up, pt states that taking the 1/2 tab of Prozac workd best for her. )   HPI This 31 y.o. female presents for evaluation of anxiety and depression; doing better on Prozac yet taking 1/2 tablet daily; could not tolerate one whole tablet. Has upcoming appointment with psychiatry to evaluate for bipolar disorder.  Did not return to therapist.  No SI.  Less down; less depressive symptoms.  Anxiety improved but still very much present.  Miserable in current job.  Coworkers are miserable.  No SI/HI.  Hemorrhoid:  Treated fissure; did not clear up; referred to gastroenterology; prescribed more medication.  Used it but not beneficial.  In past few days, has noticed recurrent painful bulge.  Using preparation H.     BP Readings from Last 3 Encounters:  05/27/17 120/68  04/15/17 113/71  03/17/17 112/73   Wt Readings from Last 3 Encounters:  05/27/17 141 lb (64 kg)  04/15/17 147 lb (66.7 kg)  03/17/17 145 lb 6.4 oz (66 kg)   Immunization History  Administered Date(s) Administered  . Hepatitis A, Adult 05/31/2014, 11/30/2014  . Hepatitis B, adult 05/31/2014, 06/28/2014, 11/30/2014  . Influenza,inj,Quad PF,6+ Mos 07/21/2013, 06/28/2014  . Influenza-Unspecified 07/11/2015, 08/24/2016  . Tdap 03/03/2012    Review of Systems  Constitutional: Negative for chills, diaphoresis, fatigue and fever.  Eyes: Negative for visual disturbance.  Respiratory: Negative for cough and shortness of breath.   Cardiovascular: Negative for chest pain, palpitations and leg swelling.  Gastrointestinal: Positive for anal bleeding and rectal pain. Negative for abdominal distention, abdominal pain, blood in stool, constipation, diarrhea, nausea and vomiting.  Endocrine: Negative for cold intolerance, heat intolerance, polydipsia, polyphagia and polyuria.  Neurological: Negative for  dizziness, tremors, seizures, syncope, facial asymmetry, speech difficulty, weakness, light-headedness, numbness and headaches.  Psychiatric/Behavioral: Positive for dysphoric mood. Negative for self-injury, sleep disturbance and suicidal ideas. The patient is nervous/anxious.     Past Medical History:  Diagnosis Date  . Allergy   . Anemia   . Anxiety   . Chest pain   . Depression   . Genital warts   . GERD (gastroesophageal reflux disease)   . Interstitial cystitis   . RMSF [facility] spotted fever) 2015  . Ulcer    Past Surgical History:  Procedure Laterality Date  . OVARIAN CYST REMOVAL    . TONSILLECTOMY     Allergies  Allergen Reactions  . Diflucan [Fluconazole] Hives    Social History   Social History  . Marital status: Single    Spouse name: N/A  . Number of children: 0  . Years of education: N/A   Occupational History  . [employer]    Social History Main Topics  . Smoking status: Former Research scientist (life sciences)  . Smokeless tobacco: Never Used  . Alcohol use 4.2 oz/week    7 Standard drinks or equivalent per week     Comment: Drinks at least 1 drink a day of different things  . Drug use: No  . Sexual activity: Not Currently    Birth control/ protection: Pill   Other Topics Concern  . Not on file   Social History Narrative      Marital status:  Single; parents in [place]      Children: none      Employment: [place] [employer] works indoors       Tobacco:  none      Alcohol: socially; twice weekly      Drugs: none      Exercise: sporadic; walking sometimes         Daily caffeine    Family History  Problem Relation Age of Onset  . GI problems Sister   . Asthma Sister   . Anxiety disorder Sister   . Heart disease Maternal Grandmother   . Anxiety disorder Mother   . Depression Mother   . Alcohol abuse Maternal Grandfather   . Anxiety disorder Sister   . Cancer Maternal Aunt        breast  . OCD Maternal Aunt   . Colon cancer Neg Hx        Objective:    BP  120/68   Pulse 65   Temp 98.5 F (36.9 C) (Oral)   Resp 18   Ht 5' 5.75" (1.67 m)   Wt 141 lb (64 kg)   SpO2 98%   BMI 22.93 kg/m  Physical Exam  Constitutional: She is oriented to person, place, and time. She appears well-developed and well-nourished. No distress.  HENT:  Head: Normocephalic and atraumatic.  Right Ear: External ear normal.  Left Ear: External ear normal.  Nose: Nose normal.  Mouth/Throat: Oropharynx is clear and moist.  Eyes: Pupils are equal, round, and reactive to light. Conjunctivae and EOM are normal.  Neck: Normal range of motion. Neck supple. Carotid bruit is not present. No thyromegaly present.  Cardiovascular: Normal rate, regular rhythm, normal heart sounds and intact distal pulses.  Exam reveals no gallop and no friction rub.   No murmur heard. Pulmonary/Chest: Effort normal and breath sounds normal. She has no wheezes. She has no rales.  Abdominal: Soft. Bowel sounds are normal. She exhibits no distension and no mass. There is no tenderness. There is no rebound and no guarding.  Genitourinary: Rectal exam shows fissure and tenderness. Rectal exam shows no mass and anal tone normal.  Lymphadenopathy:    She has no cervical adenopathy.  Neurological: She is alert and oriented to person, place, and time. No cranial nerve deficit.  Skin: Skin is warm and dry. No rash noted. She is not diaphoretic. No erythema. No pallor.  Psychiatric: She has a normal mood and affect. Her behavior is normal. Judgment and thought content normal.   Results for orders placed or performed in visit on 03/17/17  Culture, Group A Strep  Result Value Ref Range   Strep A Culture Negative   CBC with Differential/Platelet  Result Value Ref Range   WBC 4.1 3.4 - 10.8 x10E3/uL   RBC 4.11 3.77 - 5.28 x10E6/uL   Hemoglobin 13.1 11.1 - 15.9 g/dL   Hematocrit 38.3 34.0 - 46.6 %   MCV 93 79 - 97 fL   MCH 31.9 26.6 - 33.0 pg   MCHC 34.2 31.5 - 35.7 g/dL   RDW 13.1 12.3 - 15.4 %    Platelets 245 150 - 379 x10E3/uL   Neutrophils 53 Not Estab. %   Lymphs 38 Not Estab. %   Monocytes 6 Not Estab. %   Eos 2 Not Estab. %   Basos 1 Not Estab. %   Neutrophils Absolute 2.1 1.4 - 7.0 x10E3/uL   Lymphocytes Absolute 1.6 0.7 - 3.1 x10E3/uL   Monocytes Absolute 0.3 0.1 - 0.9 x10E3/uL   EOS (ABSOLUTE) 0.1 0.0 - 0.4 x10E3/uL   Basophils Absolute 0.0 0.0 - 0.2 x10E3/uL   Immature Granulocytes 0 Not Estab. %  Immature Grans (Abs) 0.0 0.0 - 0.1 x10E3/uL  Comprehensive metabolic panel  Result Value Ref Range   Glucose 95 65 - 99 mg/dL   BUN 9 6 - 20 mg/dL   Creatinine, Ser 0.60 0.57 - 1.00 mg/dL   GFR calc non Af Amer 120 >59 mL/min/1.73   GFR calc Af Amer 139 >59 mL/min/1.73   BUN/Creatinine Ratio 15 9 - 23   Sodium 138 134 - 144 mmol/L   Potassium 4.5 3.5 - 5.2 mmol/L   Chloride 103 96 - 106 mmol/L   CO2 21 18 - 29 mmol/L   Calcium 9.2 8.7 - 10.2 mg/dL   Total Protein 6.9 6.0 - 8.5 g/dL   Albumin 4.6 3.5 - 5.5 g/dL   Globulin, Total 2.3 1.5 - 4.5 g/dL   Albumin/Globulin Ratio 2.0 1.2 - 2.2   Bilirubin Total 0.3 0.0 - 1.2 mg/dL   Alkaline Phosphatase 42 39 - 117 IU/L   AST 18 0 - 40 IU/L   ALT 18 0 - 32 IU/L  TSH  Result Value Ref Range   TSH 2.190 0.450 - 4.500 uIU/mL  VITAMIN D 25 Hydroxy (Vit-D Deficiency, Fractures)  Result Value Ref Range   Vit D, 25-Hydroxy 44.1 30.0 - 100.0 ng/mL  Vitamin B12  Result Value Ref Range   Vitamin B-12 374 232 - 1,245 pg/mL  Epstein-Barr virus VCA antibody panel  Result Value Ref Range   EBV VCA IgM <36.0 0.0 - 35.9 U/mL   EBV Early Antigen Ab, IgG >150.0 (H) 0.0 - 8.9 U/mL   EBV VCA IgG >600.0 (H) 0.0 - 17.9 U/mL   EBV NA IgG 524.0 (H) 0.0 - 17.9 U/mL   Interpretation: Comment   POCT urinalysis dipstick  Result Value Ref Range   Color, UA yellow yellow   Clarity, UA clear clear   Glucose, UA negative negative mg/dL   Bilirubin, UA negative negative   Ketones, POC UA negative negative mg/dL   Spec Grav, UA 1.010 1.010  - 1.025   Blood, UA trace-lysed (A) negative   pH, UA 6.0 5.0 - 8.0   Protein Ur, POC negative negative mg/dL   Urobilinogen, UA 0.2 0.2 or 1.0 E.U./dL   Nitrite, UA Negative Negative   Leukocytes, UA Negative Negative  POCT rapid strep A  Result Value Ref Range   Rapid Strep A Screen Negative Negative   Depression screen [facility] 2/9 05/27/2017 02/11/2017 10/14/2016 09/10/2016 08/29/2016  Decreased Interest 0 0 0 0 0  Down, Depressed, Hopeless 0 0 0 0 0  PHQ - 2 Score 0 0 0 0 0   No flowsheet data found.       Assessment & Plan:   1. Anxiety and depression   2. Anal fissure   3. Celiac disease   4. Interstitial cystitis    -slightly improving anxiety and depression with Prozac therapy; continue current medications; appointment in upcoming month with psychiatry; encouraged to keep appointment; recommend regular exercise daily for anxiety management. -persistent anal fissure despite aggressive treatment; recommend follow-up with GI for reevaluation.  Patient to call for follow-up appointment; continue Anusol [facility] suppositories, stool softeners. -prolonged face-to-face for 25 minutes with greater than 50% of time dedicated to counseling and coordination of care.   No orders of the defined types were placed in this encounter.  No orders of the defined types were placed in this encounter.   No Follow-up on file.   Danissa Rundle Elayne Guerin, M.D. Primary Care at [facility] previously  [facility] [address] [place], [place]  [address] [phone] phone [phone] fax

## 2017-05-27 NOTE — Patient Instructions (Addendum)
Please call gastroenterologist to follow-up on anal fissure.     IF you received an x-ray today, you will receive an invoice from [facility]. Please contact [facility] at [phone] with questions or concerns regarding your invoice.   IF you received labwork today, you will receive an invoice from [facility]. Please contact [facility] at [phone] with questions or concerns regarding your invoice.   Our billing staff will not be able to assist you with questions regarding bills from these companies.  You will be contacted with the lab results as soon as they are available. The fastest way to get your results is to activate your My Chart account. Instructions are located on the last page of this paperwork. If you have not heard from [place] regarding the results in 2 weeks, please contact this office.      Anal Fissure, Adult An anal fissure is a small tear or crack in the skin around the anus. Bleeding from a fissure usually stops on its own within a few minutes. However, bleeding will often occur again with each bowel movement until the crack heals. What are the causes? This condition may be caused by:  Passing large, hard stool (feces).  Frequent diarrhea.  Constipation.  Inflammatory bowel disease (Crohn disease or ulcerative colitis).  Infections.  Anal sex.  What are the signs or symptoms? Symptoms of this condition include:  Bleeding from the rectum.  Small amounts of blood seen on your stool, on toilet paper, or in the toilet after a bowel movement.  Painful bowel movements.  Itching or irritation around the anus.  How is this diagnosed? A health care provider may diagnose this condition by closely examining the anal area. An anal fissure can usually be seen with careful inspection. In some cases, a rectal exam may be performed, or a short tube (anoscope) may be used to examine the anal canal. How is this treated? Treatment for this condition  may include:  Taking steps to avoid constipation. This may include making changes to your diet, such as increasing your intake of fiber or fluid.  Taking fiber supplements. These supplements can soften your stool to help make bowel movements easier. Your health care provider may also prescribe a stool softener if your stool is often hard.  Taking sitz baths. This may help to heal the tear.  Using medicated creams or ointments. These may be prescribed to lessen discomfort.  Follow these instructions at home: Eating and drinking  Avoid foods that may be constipating, such as bananas and dairy products.  Drink enough fluid to keep your urine clear or pale yellow.  Maintain a diet that is high in fruits, whole grains, and vegetables. General instructions  Keep the anal area as clean and dry as possible.  Take sitz baths as told by your health care provider. Do not use soap in the sitz baths.  Take over-the-counter and prescription medicines only as told by your health care provider.  Use creams or ointments only as told by your health care provider.  Keep all follow-up visits as told by your health care provider. This is important. Contact a health care provider if:  You have more bleeding.  You have a fever.  You have diarrhea that is mixed with blood.  You continue to have pain.  Your problem is getting worse rather than better. This information is not intended to replace advice given to you by your health care provider. Make sure you discuss any questions you have with  your health care provider. Document Released: 10/06/2005 Document Revised: 02/13/2016 Document Reviewed: 01/01/2015 Elsevier Interactive Patient Education  [employer].

## 2017-06-03 ENCOUNTER — Encounter: Payer: Self-pay | Admitting: Family Medicine

## 2017-06-08 DIAGNOSIS — Z6823 Body mass index (BMI) 23.0-23.9, adult: Secondary | ICD-10-CM | POA: Diagnosis not present

## 2017-06-08 DIAGNOSIS — Z01419 Encounter for gynecological examination (general) (routine) without abnormal findings: Secondary | ICD-10-CM | POA: Diagnosis not present

## 2017-06-08 DIAGNOSIS — Z113 Encounter for screening for infections with a predominantly sexual mode of transmission: Secondary | ICD-10-CM | POA: Diagnosis not present

## 2017-06-08 MED ORDER — HYDROCORTISONE ACETATE 25 MG RE SUPP: 25.0000 mg | Freq: Two times a day (BID) | RECTAL | 2 refills | Status: DC | PRN

## 2017-06-10 ENCOUNTER — Ambulatory Visit (INDEPENDENT_AMBULATORY_CARE_PROVIDER_SITE_OTHER): Payer: BC Managed Care – PPO | Admitting: Psychiatry

## 2017-06-10 ENCOUNTER — Encounter (HOSPITAL_COMMUNITY): Payer: Self-pay | Admitting: Psychiatry

## 2017-06-10 VITALS — BP 104/66 | HR 71 | Ht 65.0 in | Wt 141.0 lb

## 2017-06-10 DIAGNOSIS — Z91410 Personal history of adult physical and sexual abuse: Secondary | ICD-10-CM | POA: Diagnosis not present

## 2017-06-10 DIAGNOSIS — F4322 Adjustment disorder with anxiety: Secondary | ICD-10-CM

## 2017-06-10 DIAGNOSIS — Z87891 Personal history of nicotine dependence: Secondary | ICD-10-CM | POA: Diagnosis not present

## 2017-06-10 DIAGNOSIS — F4323 Adjustment disorder with mixed anxiety and depressed mood: Secondary | ICD-10-CM

## 2017-06-10 DIAGNOSIS — Z818 Family history of other mental and behavioral disorders: Secondary | ICD-10-CM

## 2017-06-10 DIAGNOSIS — Z560 Unemployment, unspecified: Secondary | ICD-10-CM | POA: Diagnosis not present

## 2017-06-10 DIAGNOSIS — F419 Anxiety disorder, unspecified: Secondary | ICD-10-CM | POA: Diagnosis not present

## 2017-06-10 DIAGNOSIS — Z811 Family history of alcohol abuse and dependence: Secondary | ICD-10-CM | POA: Diagnosis not present

## 2017-06-10 MED ORDER — HYDROXYZINE PAMOATE 25 MG PO CAPS: 25.0000 mg | ORAL_CAPSULE | Freq: Three times a day (TID) | ORAL | 1 refills | Status: DC | PRN
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
## 2017-06-15 DIAGNOSIS — F32A Depression, unspecified: Secondary | ICD-10-CM | POA: Insufficient documentation

## 2017-06-15 DIAGNOSIS — F329 Major depressive disorder, single episode, unspecified: Secondary | ICD-10-CM | POA: Insufficient documentation

## 2017-06-15 DIAGNOSIS — F419 Anxiety disorder, unspecified: Secondary | ICD-10-CM | POA: Insufficient documentation

## 2017-06-17 ENCOUNTER — Other Ambulatory Visit: Payer: Self-pay | Admitting: Family Medicine

## 2017-06-24 ENCOUNTER — Encounter: Payer: Self-pay | Admitting: Family Medicine

## 2017-07-10 ENCOUNTER — Encounter: Payer: Self-pay | Admitting: Family Medicine

## 2017-07-10 ENCOUNTER — Ambulatory Visit (INDEPENDENT_AMBULATORY_CARE_PROVIDER_SITE_OTHER): Payer: BC Managed Care – PPO | Admitting: Family Medicine

## 2017-07-10 VITALS — BP 108/56 | HR 71 | Temp 98.0°F | Resp 16 | Ht 65.35 in | Wt 145.0 lb

## 2017-07-10 DIAGNOSIS — N3010 Interstitial cystitis (chronic) without hematuria: Secondary | ICD-10-CM | POA: Diagnosis not present

## 2017-07-10 DIAGNOSIS — R5383 Other fatigue: Secondary | ICD-10-CM | POA: Diagnosis not present

## 2017-07-10 DIAGNOSIS — F329 Major depressive disorder, single episode, unspecified: Secondary | ICD-10-CM

## 2017-07-10 DIAGNOSIS — F5104 Psychophysiologic insomnia: Secondary | ICD-10-CM | POA: Diagnosis not present

## 2017-07-10 DIAGNOSIS — F419 Anxiety disorder, unspecified: Secondary | ICD-10-CM

## 2017-07-10 DIAGNOSIS — F32A Depression, unspecified: Secondary | ICD-10-CM

## 2017-07-10 DIAGNOSIS — J301 Allergic rhinitis due to pollen: Secondary | ICD-10-CM

## 2017-07-10 DIAGNOSIS — K900 Celiac disease: Secondary | ICD-10-CM

## 2017-07-10 DIAGNOSIS — R5381 Other malaise: Secondary | ICD-10-CM | POA: Diagnosis not present

## 2017-07-10 LAB — POCT URINALYSIS DIP (MANUAL ENTRY)
Bilirubin, UA: NEGATIVE
GLUCOSE UA: NEGATIVE mg/dL
Ketones, POC UA: NEGATIVE mg/dL
LEUKOCYTES UA: NEGATIVE
NITRITE UA: NEGATIVE
PROTEIN UA: NEGATIVE mg/dL
Spec Grav, UA: 1.015 (ref 1.010–1.025)
UROBILINOGEN UA: 0.2 U/dL
pH, UA: 7 (ref 5.0–8.0)

## 2017-07-10 LAB — GLUCOSE, POCT (MANUAL RESULT ENTRY): POC GLUCOSE: 79 mg/dL (ref 70–99)

## 2017-07-10 MED ORDER — AMOXICILLIN 500 MG PO TABS: 1000.0000 mg | ORAL_TABLET | Freq: Two times a day (BID) | ORAL | 0 refills | Status: DC

## 2017-07-10 NOTE — Progress Notes (Signed)
Subjective:    Patient ID: Mary Wyatt, female    DOB: Feb 17, 1986, 31 y.o.   MRN: [id number]  07/10/2017  Sore Throat (with mucus and chest congestion )   HPI This 31 y.o. female presents for evaluation of ongoing URI symptoms.  Low grade fever Tmax 99.2.  Onset two months ago.  Excessive phlegm.  Tinged with yellow versus clear.  Then having wet cough.  Using left over inhaler.  Fluttering feeling in chest.  After rain, AC units smell musty; not using AC for the past couple of days.  Lives in apartment.  Depending on landlord.  Running dehumidifier.  Excessive moisture; feels whoozy.  Running errands; feels weird.  Felt like going to pass out in [employer]. Unemployed.  Sleep is off.  Wakes up at 3:30am bored.  No sinus pressure; eyes have pressure.    Using Astelin bid.  Congestion is not terrible.   Not exercising.  Got a gym membership; baby stepping.  [employer] seems toxic work environment.    Day of resignation, manager moved patient to the fourth office in five months.  Went home that Friday; went to HR on Monday.  Quit to [employer]; no notice.  Documented everything.  Maybe manager was trying to make her quit.  Lack of trust.   No tick bites.    Taking Loratadine every morning.  Has Hydroxyzine but not sleeping well.  Has tried Melatonin in past but had a bad nightmare.  BP Readings from Last 3 Encounters:  07/10/17 (!) 108/56  05/27/17 120/68  04/15/17 113/71   Wt Readings from Last 3 Encounters:  07/10/17 145 lb (65.8 kg)  05/27/17 141 lb (64 kg)  04/15/17 147 lb (66.7 kg)   Immunization History  Administered Date(s) Administered  . Hepatitis A, Adult 05/31/2014, 11/30/2014  . Hepatitis B, adult 05/31/2014, 06/28/2014, 11/30/2014  . Influenza,inj,Quad PF,6+ Mos 07/21/2013, 06/28/2014  . Influenza-Unspecified 07/11/2015, 08/24/2016  . Tdap 03/03/2012    Review of Systems  Constitutional: Positive for fatigue. Negative for chills, diaphoresis and fever.  HENT:  Positive for congestion, postnasal drip and rhinorrhea. Negative for sinus pain, sinus pressure, sneezing, sore throat, trouble swallowing and voice change.   Eyes: Negative for visual disturbance.  Respiratory: Positive for cough. Negative for shortness of breath.   Cardiovascular: Negative for chest pain, palpitations and leg swelling.  Gastrointestinal: Negative for abdominal pain, constipation, diarrhea, nausea and vomiting.  Endocrine: Negative for cold intolerance, heat intolerance, polydipsia, polyphagia and polyuria.  Neurological: Negative for dizziness, tremors, seizures, syncope, facial asymmetry, speech difficulty, weakness, light-headedness, numbness and headaches.  Psychiatric/Behavioral: Positive for dysphoric mood and sleep disturbance. The patient is nervous/anxious.     Past Medical History:  Diagnosis Date  . Allergy   . Anemia   . Anxiety   . Chest pain   . Depression   . Genital warts   . GERD (gastroesophageal reflux disease)   . Interstitial cystitis   . RMSF [facility] spotted fever) 2015  . Ulcer    Past Surgical History:  Procedure Laterality Date  . OVARIAN CYST REMOVAL    . TONSILLECTOMY     Allergies  Allergen Reactions  . Diflucan [Fluconazole] Hives   Current Outpatient Prescriptions  Medication Sig Dispense Refill  . azelastine (ASTELIN) 0.1 % nasal spray Place 2 sprays into both nostrils 2 (two) times daily. 30 mL 11  . diltiazem 2 % GEL Apply 1 application topically 3 (three) times daily. 30 g 1  . hydrocortisone (  ANUSOL-HC) 25 MG suppository Place 1 suppository (25 mg total) rectally 2 (two) times daily as needed for hemorrhoids or anal itching. 24 suppository 2  . hydrOXYzine (VISTARIL) 25 MG capsule Take 1 capsule (25 mg total) by mouth every 8 (eight) hours as needed for anxiety or nausea. 120 capsule 1  . KAITLIB FE 0.8-25 MG-MCG tablet as directed.  2  . amoxicillin (AMOXIL) 500 MG tablet Take 2 tablets (1,000 mg total) by mouth 2  (two) times daily. 40 tablet 0   No current facility-administered medications for this visit.    Social History   Social History  . Marital status: Single    Spouse name: N/A  . Number of children: 0  . Years of education: N/A   Occupational History  . [employer]    Social History Main Topics  . Smoking status: Former Research scientist (life sciences)  . Smokeless tobacco: Never Used  . Alcohol use 4.2 oz/week    7 Standard drinks or equivalent per week     Comment: Drinks at least 1 drink a day of different things  . Drug use: No  . Sexual activity: Not Currently    Birth control/ protection: Pill   Other Topics Concern  . Not on file   Social History Narrative      Marital status:  Single; parents in [place]: none      Employment: [employer] works indoors       Tobacco: none      Alcohol: socially; twice weekly      Drugs: none      Exercise: sporadic; walking sometimes         Daily caffeine    Family History  Problem Relation Age of Onset  . GI problems Sister   . Asthma Sister   . Anxiety disorder Sister   . Heart disease Maternal Grandmother   . Anxiety disorder Mother   . Depression Mother   . Alcohol abuse Maternal Grandfather   . Anxiety disorder Sister   . Cancer Maternal Aunt        breast  . OCD Maternal Aunt   . Colon cancer Neg Hx        Objective:    BP (!) 108/56   Pulse 71   Temp 98 F (36.7 C) (Oral)   Resp 16   Ht 5' 5.35" (1.66 m)   Wt 145 lb (65.8 kg)   SpO2 98%   BMI 23.87 kg/m  Physical Exam  Constitutional: She is oriented to person, place, and time. She appears well-developed and well-nourished. No distress.  HENT:  Head: Normocephalic and atraumatic.  Right Ear: External ear normal.  Left Ear: External ear normal.  Nose: Nose normal.  Mouth/Throat: Oropharynx is clear and moist.  Eyes: Pupils are equal, round, and reactive to light. Conjunctivae and EOM are normal.  Neck: Normal range of motion. Neck supple. Carotid bruit is  not present. No thyromegaly present.  Cardiovascular: Normal rate, regular rhythm, normal heart sounds and intact distal pulses.  Exam reveals no gallop and no friction rub.   No murmur heard. Pulmonary/Chest: Effort normal and breath sounds normal. She has no wheezes. She has no rales.  Abdominal: Soft. Bowel sounds are normal. She exhibits no distension and no mass. There is no tenderness. There is no rebound and no guarding.  Lymphadenopathy:    She has no cervical adenopathy.  Neurological: She is alert and oriented to person, place, and time. No cranial  nerve deficit. She exhibits normal muscle tone. Coordination normal.  Skin: Skin is warm and dry. No rash noted. She is not diaphoretic. No erythema. No pallor.  Psychiatric: She has a normal mood and affect. Her behavior is normal. Judgment and thought content normal.    No results found. Depression screen [facility] 2/9 07/10/2017 05/27/2017 02/11/2017 10/14/2016 09/10/2016  Decreased Interest 0 0 0 0 0  Down, Depressed, Hopeless 0 0 0 0 0  PHQ - 2 Score 0 0 0 0 0   Fall Risk  07/10/2017 05/27/2017 04/15/2017 02/11/2017 10/14/2016  Falls in the past year? No No No No No        Assessment & Plan:   1. Malaise and fatigue   2. Seasonal allergic rhinitis due to pollen   3. Anxiety and depression   4. Interstitial cystitis   5. Celiac disease    -recurrent malaise and fatigue; obtain labs; treat allergic rhinitis aggressively with oral antihistamine of choice, Flonase, and Astelin.  Recommend nasal irrigation as well. -contact landlord regarding musty odor in apartment. -recommend unisom and Melatonin for insomnia. -encourage daily exercise for stress management, insomnia, and anxiety/depression. -s/p psychiatry consultation who ruled out bipolar disorder and recommended weaning Prozac therapy. -rx for Amoxicillin to start if no improvement in nasal congestion and cough with treatment of allergic rhinitis.  Orders Placed This Encounter    Procedures  . Urine Culture    Order Specific Question:   Source    Answer:   clean catch  . Comprehensive metabolic panel  . Epstein-Barr virus VCA antibody panel  . CBC with Differential/Platelet  . CBC with Differential/Platelet  . POCT glucose (manual entry)  . POCT urinalysis dipstick   Meds ordered this encounter  Medications  . amoxicillin (AMOXIL) 500 MG tablet    Sig: Take 2 tablets (1,000 mg total) by mouth 2 (two) times daily.    Dispense:  40 tablet    Refill:  0    No Follow-up on file.   Ilene Witcher Elayne Guerin, M.D. [facility] at [facility] [address] [place], [place]  [address] [phone] phone [phone] fax

## 2017-07-10 NOTE — Patient Instructions (Addendum)
Call your landlord regarding musty smelling air from air conditioner.  Recommend Melatonin. Consider Unisom which you can take with Melatonin but not with Hydroxyzine. Recommend exercise daily to help with insomnia.    Insomnia Insomnia is a sleep disorder that makes it difficult to fall asleep or to stay asleep. Insomnia can cause tiredness (fatigue), low energy, difficulty concentrating, mood swings, and poor performance at work or school. There are three different ways to classify insomnia:  Difficulty falling asleep.  Difficulty staying asleep.  Waking up too early in the morning.  Any type of insomnia can be long-term (chronic) or short-term (acute). Both are common. Short-term insomnia usually lasts for three months or less. Chronic insomnia occurs at least three times a week for longer than three months. What are the causes? Insomnia may be caused by another condition, situation, or substance, such as:  Anxiety.  Certain medicines.  Gastroesophageal reflux disease (GERD) or other gastrointestinal conditions.  Asthma or other breathing conditions.  Restless legs syndrome, sleep apnea, or other sleep disorders.  Chronic pain.  Menopause. This may include hot flashes.  Stroke.  Abuse of alcohol, tobacco, or illegal drugs.  Depression.  Caffeine.  Neurological disorders, such as Alzheimer disease.  An overactive thyroid (hyperthyroidism).  The cause of insomnia may not be known. What increases the risk? Risk factors for insomnia include:  Gender. Women are more commonly affected than men.  Age. Insomnia is more common as you get older.  Stress. This may involve your professional or personal life.  Income. Insomnia is more common in people with lower income.  Lack of exercise.  Irregular work schedule or night shifts.  Traveling between different time zones.  What are the signs or symptoms? If you have insomnia, trouble falling asleep or trouble  staying asleep is the main symptom. This may lead to other symptoms, such as:  Feeling fatigued.  Feeling nervous about going to sleep.  Not feeling rested in the morning.  Having trouble concentrating.  Feeling irritable, anxious, or depressed.  How is this treated? Treatment for insomnia depends on the cause. If your insomnia is caused by an underlying condition, treatment will focus on addressing the condition. Treatment may also include:  Medicines to help you sleep.  Counseling or therapy.  Lifestyle adjustments.  Follow these instructions at home:  Take medicines only as directed by your health care provider.  Keep regular sleeping and waking hours. Avoid naps.  Keep a sleep diary to help you and your health care provider figure out what could be causing your insomnia. Include: ? When you sleep. ? When you wake up during the night. ? How well you sleep. ? How rested you feel the next day. ? Any side effects of medicines you are taking. ? What you eat and drink.  Make your bedroom a comfortable place where it is easy to fall asleep: ? Put up shades or special blackout curtains to block light from outside. ? Use a white noise machine to block noise. ? Keep the temperature cool.  Exercise regularly as directed by your health care provider. Avoid exercising right before bedtime.  Use relaxation techniques to manage stress. Ask your health care provider to suggest some techniques that may work well for you. These may include: ? Breathing exercises. ? Routines to release muscle tension. ? Visualizing peaceful scenes.  Cut back on alcohol, caffeinated beverages, and cigarettes, especially close to bedtime. These can disrupt your sleep.  Do not overeat or eat spicy foods  right before bedtime. This can lead to digestive discomfort that can make it hard for you to sleep.  Limit screen use before bedtime. This includes: ? Watching TV. ? Using your smartphone, tablet,  and computer.  Stick to a routine. This can help you fall asleep faster. Try to do a quiet activity, brush your teeth, and go to bed at the same time each night.  Get out of bed if you are still awake after 15 minutes of trying to sleep. Keep the lights down, but try reading or doing a quiet activity. When you feel sleepy, go back to bed.  Make sure that you drive carefully. Avoid driving if you feel very sleepy.  Keep all follow-up appointments as directed by your health care provider. This is important. Contact a health care provider if:  You are tired throughout the day or have trouble in your daily routine due to sleepiness.  You continue to have sleep problems or your sleep problems get worse. Get help right away if:  You have serious thoughts about hurting yourself or someone else. This information is not intended to replace advice given to you by your health care provider. Make sure you discuss any questions you have with your health care provider. Document Released: 10/03/2000 Document Revised: 03/07/2016 Document Reviewed: 07/07/2014 Elsevier Interactive Patient Education  2018 [employer].    IF you received an x-ray today, you will receive an invoice from [facility]. Please contact [facility] at [phone] with questions or concerns regarding your invoice.   IF you received labwork today, you will receive an invoice from [place]. Please contact [facility] at [phone] with questions or concerns regarding your invoice.   Our billing staff will not be able to assist you with questions regarding bills from these companies.  You will be contacted with the lab results as soon as they are available. The fastest way to get your results is to activate your My Chart account. Instructions are located on the last page of this paperwork. If you have not heard from [place] regarding the results in 2 weeks, please contact this office.

## 2017-07-11 LAB — COMPREHENSIVE METABOLIC PANEL
A/G RATIO: 1.9 (ref 1.2–2.2)
ALBUMIN: 4.7 g/dL (ref 3.5–5.5)
ALT: 22 IU/L (ref 0–32)
AST: 27 IU/L (ref 0–40)
Alkaline Phosphatase: 47 IU/L (ref 39–117)
BUN/Creatinine Ratio: 11 (ref 9–23)
BUN: 9 mg/dL (ref 6–20)
Bilirubin Total: 0.3 mg/dL (ref 0.0–1.2)
CHLORIDE: 103 mmol/L (ref 96–106)
CO2: 22 mmol/L (ref 20–29)
CREATININE: 0.83 mg/dL (ref 0.57–1.00)
Calcium: 9.6 mg/dL (ref 8.7–10.2)
GFR calc Af Amer: 107 mL/min/{1.73_m2} (ref >59)
GFR calc non Af Amer: 93 mL/min/{1.73_m2} (ref >59)
GLOBULIN, TOTAL: 2.5 g/dL (ref 1.5–4.5)
Glucose: 83 mg/dL (ref 65–99)
Potassium: 4.4 mmol/L (ref 3.5–5.2)
SODIUM: 139 mmol/L (ref 134–144)
TOTAL PROTEIN: 7.2 g/dL (ref 6.0–8.5)

## 2017-07-11 LAB — CBC WITH DIFFERENTIAL/PLATELET
BASOS: 1 %
Basophils Absolute: 0 10*3/uL (ref 0.0–0.2)
EOS (ABSOLUTE): 0.2 10*3/uL (ref 0.0–0.4)
EOS: 4 %
HEMATOCRIT: 39.8 % (ref 34.0–46.6)
HEMOGLOBIN: 13.5 g/dL (ref 11.1–15.9)
IMMATURE GRANULOCYTES: 0 %
Immature Grans (Abs): 0 10*3/uL (ref 0.0–0.1)
Lymphocytes Absolute: 1.8 10*3/uL (ref 0.7–3.1)
Lymphs: 42 %
MCH: 31.8 pg (ref 26.6–33.0)
MCHC: 33.9 g/dL (ref 31.5–35.7)
MCV: 94 fL (ref 79–97)
MONOCYTES: 6 %
MONOS ABS: 0.3 10*3/uL (ref 0.1–0.9)
NEUTROS PCT: 47 %
Neutrophils Absolute: 2 10*3/uL (ref 1.4–7.0)
Platelets: 251 10*3/uL (ref 150–379)
RBC: 4.24 x10E6/uL (ref 3.77–5.28)
RDW: 12.9 % (ref 12.3–15.4)
WBC: 4.3 10*3/uL (ref 3.4–10.8)

## 2017-07-11 LAB — EPSTEIN-BARR VIRUS VCA ANTIBODY PANEL
EBV EARLY ANTIGEN AB, IGG: 124 U/mL — AB (ref 0.0–8.9)
EBV NA IGG: 452 U/mL — AB (ref 0.0–17.9)
EBV VCA IgG: >600 U/mL — ABNORMAL HIGH (ref 0.0–17.9)
EBV VCA IgM: <36 U/mL (ref 0.0–35.9)

## 2017-07-11 LAB — URINE CULTURE

## 2017-07-29 ENCOUNTER — Ambulatory Visit (INDEPENDENT_AMBULATORY_CARE_PROVIDER_SITE_OTHER): Payer: BLUE CROSS/BLUE SHIELD | Admitting: Gastroenterology

## 2017-07-29 ENCOUNTER — Encounter: Payer: Self-pay | Admitting: Gastroenterology

## 2017-07-29 ENCOUNTER — Other Ambulatory Visit: Payer: BLUE CROSS/BLUE SHIELD

## 2017-07-29 VITALS — BP 110/78 | HR 66 | Ht 65.0 in | Wt 148.2 lb

## 2017-07-29 DIAGNOSIS — K602 Anal fissure, unspecified: Secondary | ICD-10-CM | POA: Diagnosis not present

## 2017-07-29 DIAGNOSIS — K900 Celiac disease: Secondary | ICD-10-CM

## 2017-07-29 NOTE — Progress Notes (Signed)
    History of Present Illness: This is a 31 year old female with an anal fissure diagnosed in March. Symptoms resolved then fissure recurred in August. Former patient of Dr. Kelby Fam. She was evaluated by Donald Siva in March and placed on diltiazem gel and stool softeners. When her symptoms recurred she restarted diltiazem gel in August and over the past few weeks she has noted resolution of symptoms. She's been taking stool softeners and continues on diltiazem. She a suspected diagnosis of celiac disease. She does not follow gluten free diet.  EGD in 2013 by Dr. 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  EGD in 2014 by Dr. Deatra Ina with duodenal biopsies showing an intact villous architecture with increased duodenal lymphocytes, question early sprue. March 2018: Normal tTG IgA of 1 with an IgA of 66 and a normal tTG IgG of 1.  Feb 2014:  Normal tTG IgA of 9.5 with an IgA of 54 and a normal tTG IgG of 12.7.  March 2018: Endomysial IgA and tTG IgG were negative. \ Current Medications, Allergies, Past Medical History, Past Surgical History, Family History and Social History were reviewed in [employer] record.  Physical Exam: General: Well developed, well nourished, no acute distress Head: Normocephalic and atraumatic Eyes:  sclerae anicteric, EOMI Ears: Normal auditory acuity Mouth: No deformity or lesions Lungs: Clear throughout to auscultation Heart: Regular rate and rhythm; no murmurs, rubs or bruits Abdomen: Soft, non tender and non distended. No masses, hepatosplenomegaly or hernias noted. Normal Bowel sounds Rectal: no lesions, no tenderness, heme negative brown stool in the vault Musculoskeletal: Symmetrical with no gross deformities  Pulses:  Normal pulses noted Extremities: No clubbing, cyanosis, edema or deformities noted Neurological: Alert oriented x 4, grossly  nonfocal Psychological:  Alert and cooperative. Normal mood and affect  Assessment and Recommendations:  1. Anal fissure, resolved. Continue diltiazem gel 3 times a day for 1 more month to complete a total of 3 months of therapy. High fiber diet with adequate daily water intake long-term. Continue stool softeners for another 4 months.  2. Suspected celiac disease based on prior duodenal biopsies however serologies are negative in the setting a low IgA. Send HLA DQ2/DQ8. Repeat EGD with biopsies if positive.

## 2017-07-29 NOTE — Patient Instructions (Signed)
Your physician has requested that you go to the basement for lab work before leaving today.  Continue diltiazem gel x 1 more month and stool softeners x 3-4 months.   Thank you for choosing me and [facility].  Pricilla Riffle. Dagoberto Ligas., MD., Marval Regal

## 2017-08-04 LAB — CELIAC DISEASE HLA DQ ASSOC.
DQ2 (DQA1 0501/0505, DQB1 02XX): POSITIVE
DQ8 (DQA1 03XX, DQB1 0302): NEGATIVE

## 2017-08-25 NOTE — Progress Notes (Signed)
Chief Complaint  Patient presents with  . URI    cough, chest congestion with green phlegm x 1 week, tried otc meds with no relief    HPI   Chief Complaint  Patient presents with  . URI    cough, chest congestion with green phlegm x 1 week, tried otc meds with no relief   Social History   Social History Narrative      Marital status:  Single; parents in [place]: none      Employment: [employer] works indoors       Tobacco: none      Alcohol: socially; twice weekly      Drugs: none      Exercise: sporadic; walking sometimes         Daily caffeine     Subjective: Mary Wyatt is a 31 y.o. who comes in today for a URI. This has been going on for 1 weeks The worst part is congestion and cough . It is getting worse she has not had sick exposure(s) or recent travel.  Current Outpatient Medications  Medication Sig Dispense Refill  . azelastine (ASTELIN) 0.1 % nasal spray Place 2 sprays into both nostrils 2 (two) times daily. 30 mL 11  . diltiazem 2 % GEL Apply 1 application topically 3 (three) times daily. 30 g 1  . docusate sodium (COLACE) 100 MG capsule Take 100 mg by mouth 2 (two) times daily as needed for mild constipation.    . hydrOXYzine (VISTARIL) 25 MG capsule Take 1 capsule (25 mg total) by mouth every 8 (eight) hours as needed for anxiety or nausea. 120 capsule 1  . KAITLIB FE 0.8-25 MG-MCG tablet as directed.  2  . benzonatate (TESSALON) 100 MG capsule Take 1 capsule (100 mg total) 2 (two) times daily as needed by mouth for cough. 20 capsule 0  . fluticasone (FLONASE) 50 MCG/ACT nasal spray Place 2 sprays daily into both nostrils. 16 g 6   No current facility-administered medications for this visit.    Past Medical History:  Diagnosis Date  . Allergy   . Anemia   . Anxiety   . Chest pain   . Depression   . Genital warts   . GERD (gastroesophageal reflux disease)   . Interstitial cystitis   . [facility] spotted fever)  2015  . Ulcer    Allergies  Allergen Reactions  . Diflucan [Fluconazole] Hives   Past Surgical History:  Procedure Laterality Date  . OVARIAN CYST REMOVAL    . TONSILLECTOMY     Social History   Socioeconomic History  . Marital status: Single    Spouse name: Not on file  . Number of children: 0  . Years of education: Not on file  . Highest education level: Not on file  Social Needs  . Financial resource strain: Not on file  . Food insecurity - worry: Not on file  . Food insecurity - inability: Not on file  . Transportation needs - medical: Not on file  . Transportation needs - non-medical: Not on file  Occupational History  . Occupation: [employer]  Tobacco Use  . Smoking status: Former Research scientist (life sciences)  . Smokeless tobacco: Never Used  Substance and Sexual Activity  . Alcohol use: Yes    Alcohol/week: 4.2 oz    Types: 7 Standard drinks or equivalent per week    Comment: Drinks at least 1 drink a day of different things  . Drug  use: No  . Sexual activity: Not Currently    Birth control/protection: Pill  Other Topics Concern  . Not on file  Social History Narrative      Marital status:  Single; parents in [place]: none      Employment: [employer] works indoors       Tobacco: none      Alcohol: socially; twice weekly      Drugs: none      Exercise: sporadic; walking sometimes         Daily caffeine    Family History  Problem Relation Age of Onset  . GI problems Sister   . Asthma Sister   . Anxiety disorder Sister   . Heart disease Maternal Grandmother   . Anxiety disorder Mother   . Depression Mother   . Alcohol abuse Maternal Grandfather   . Anxiety disorder Sister   . Cancer Maternal Aunt        breast  . OCD Maternal Aunt   . Colon cancer Neg Hx     Review of systems for fever is negative. Respiratory ROS: positive for - cough and sputum changes negative for - hemoptysis, pleuritic pain, shortness of breath, stridor, tachypnea or  wheezing ENT ROS: positive for - nasal congestion and sinus pain negative for - epistaxis, headaches, hearing change, nasal discharge, nasal polyps, sneezing, tinnitus or vertigo   Physical Exam: Vitals:   08/26/17 1514  BP: 100/60  Pulse: 87  Resp: 16  Temp: 98.4 F (36.9 C)  SpO2: 98%   Constitutional: Appears well-developed and well-nourished. No distress.  Eyes: Conjunctivae normal. Pupils equal and reactive bilaterally. Neck: Neck supple. No tracheal deviation present. No thyroid enlargement. ENT:  Nasal passages swollen, oropharynx benign, TMs normal Heart: Regular rate, no murmurs appreciated on auscultation. No thrills appreciated on palpation. Lungs: Clear to ausculatation bilaterally, normal respiratory rate and effort.  Assessment and Plan: Leonette was seen today for uri.  Diagnoses and all orders for this visit:  Acute URI- advised supportive care Discussed using menthol free lozenges Continue antihistamine  Other orders -     Cancel: Flu Vaccine QUAD 36+ mos IM -     fluticasone (FLONASE) 50 MCG/ACT nasal spray; Place 2 sprays daily into both nostrils. -     benzonatate (TESSALON) 100 MG capsule; Take 1 capsule (100 mg total) 2 (two) times daily as needed by mouth for cough.     Return to clinic if worsens or new symptoms    Physical Exam

## 2017-08-26 ENCOUNTER — Encounter: Payer: Self-pay | Admitting: Family Medicine

## 2017-08-26 ENCOUNTER — Ambulatory Visit: Payer: BLUE CROSS/BLUE SHIELD | Admitting: Family Medicine

## 2017-08-26 VITALS — BP 100/60 | HR 87 | Temp 98.4°F | Resp 16 | Ht 65.0 in | Wt 146.4 lb

## 2017-08-26 DIAGNOSIS — J069 Acute upper respiratory infection, unspecified: Secondary | ICD-10-CM | POA: Diagnosis not present

## 2017-08-26 MED ORDER — FLUTICASONE PROPIONATE 50 MCG/ACT NA SUSP: 2.0000 | Freq: Every day | NASAL | 6 refills | Status: DC

## 2017-08-26 MED ORDER — BENZONATATE 100 MG PO CAPS: 100.0000 mg | ORAL_CAPSULE | Freq: Two times a day (BID) | ORAL | 0 refills | Status: DC | PRN

## 2017-08-26 NOTE — Patient Instructions (Addendum)
IF you received an x-ray today, you will receive an invoice from [facility]. Please contact [facility] Radiology at [phone] with questions or concerns regarding your invoice.   IF you received labwork today, you will receive an invoice from [place]. Please contact [facility] at [phone] with questions or concerns regarding your invoice.   Our billing staff will not be able to assist you with questions regarding bills from these companies.  You will be contacted with the lab results as soon as they are available. The fastest way to get your results is to activate your My Chart account. Instructions are located on the last page of this paperwork. If you have not heard from [place] regarding the results in 2 weeks, please contact this office.     Viral Respiratory Infection A viral respiratory infection is an illness that affects parts of the body used for breathing, like the lungs, nose, and throat. It is caused by a germ called a virus. Some examples of this kind of infection are:  A cold.  The flu (influenza).  A respiratory syncytial virus (RSV) infection.  How do I know if I have this infection? Most of the time this infection causes:  A stuffy or runny nose.  Yellow or green fluid in the nose.  A cough.  Sneezing.  Tiredness (fatigue).  Achy muscles.  A sore throat.  Sweating or chills.  A fever.  A headache.  How is this infection treated? If the flu is diagnosed early, it may be treated with an antiviral medicine. This medicine shortens the length of time a person has symptoms. Symptoms may be treated with over-the-counter and prescription medicines, such as:  Expectorants. These make it easier to cough up mucus.  Decongestant nasal sprays.  Doctors do not prescribe antibiotic medicines for viral infections. They do not work with this kind of infection. How do I know if I should stay home? To keep others from getting sick, stay home if you  have:  A fever.  A lasting cough.  A sore throat.  A runny nose.  Sneezing.  Muscles aches.  Headaches.  Tiredness.  Weakness.  Chills.  Sweating.  An upset stomach (nausea).  Follow these instructions at home:  Rest as much as possible.  Take over-the-counter and prescription medicines only as told by your doctor.  Drink enough fluid to keep your pee (urine) clear or pale yellow.  Gargle with salt water. Do this 3-4 times per day or as needed. To make a salt-water mixture, dissolve -1 tsp of salt in 1 cup of warm water. Make sure the salt dissolves all the way.  Use nose drops made from salt water. This helps with stuffiness (congestion). It also helps soften the skin around your nose.  Do not drink alcohol.  Do not use tobacco products, including cigarettes, chewing tobacco, and e-cigarettes. If you need help quitting, ask your doctor. Get help if:  Your symptoms last for 10 days or longer.  Your symptoms get worse over time.  You have a fever.  You have very bad pain in your face or forehead.  Parts of your jaw or neck become very swollen. Get help right away if:  You feel pain or pressure in your chest.  You have shortness of breath.  You faint or feel like you will faint.  You keep throwing up (vomiting).  You feel confused. This information is not intended to replace advice given to you by your health care provider.  Make sure you discuss any questions you have with your health care provider. Document Released: 09/18/2008 Document Revised: 03/13/2016 Document Reviewed: 03/14/2015 Elsevier Interactive Patient Education  2018 [employer].

## 2017-08-31 ENCOUNTER — Ambulatory Visit: Payer: BLUE CROSS/BLUE SHIELD | Admitting: Clinical

## 2017-08-31 DIAGNOSIS — F411 Generalized anxiety disorder: Secondary | ICD-10-CM

## 2017-09-09 ENCOUNTER — Ambulatory Visit: Payer: BLUE CROSS/BLUE SHIELD | Admitting: Clinical

## 2017-09-09 DIAGNOSIS — F411 Generalized anxiety disorder: Secondary | ICD-10-CM | POA: Diagnosis not present

## 2017-09-11 ENCOUNTER — Ambulatory Visit (INDEPENDENT_AMBULATORY_CARE_PROVIDER_SITE_OTHER): Payer: BLUE CROSS/BLUE SHIELD | Admitting: Physician Assistant

## 2017-09-11 ENCOUNTER — Encounter: Payer: Self-pay | Admitting: Physician Assistant

## 2017-09-11 ENCOUNTER — Other Ambulatory Visit: Payer: Self-pay

## 2017-09-11 VITALS — BP 108/80 | HR 82 | Temp 98.4°F | Resp 18 | Ht 65.0 in | Wt 151.2 lb

## 2017-09-11 DIAGNOSIS — H6691 Otitis media, unspecified, right ear: Secondary | ICD-10-CM

## 2017-09-11 DIAGNOSIS — H9201 Otalgia, right ear: Secondary | ICD-10-CM

## 2017-09-11 MED ORDER — AMOXICILLIN 500 MG PO CAPS: 1000.0000 mg | ORAL_CAPSULE | Freq: Three times a day (TID) | ORAL | 0 refills | Status: AC

## 2017-09-11 NOTE — Patient Instructions (Addendum)
Stay well hydrated and get plenty of rest. Drink at least 64 oz water daily.  Take the entire course of antibiotics, even if you start to feel better.  If you are a smoker, do not smoke while you are sick.  Use your nasal spray at night as well. Hot shower in the am will help to clear mucus.  Come back if you are not better in 10-14 days.   Stay well hydrated. Get lost of rest.   Advil or ibuprofen for pain. Use Elderberry syrup.   For sore throat try using a honey-based tea. Use 3 teaspoons of honey with juice squeezed from half lemon. Place shaved pieces of ginger into 1/2-1 cup of water and warm over stove top. Then mix the ingredients and repeat every 4 hours as needed.  Cough Syrup Recipe: Sweet Lemon & Honey Thyme  Ingredients a handful of fresh thyme sprigs   1 pint of water (2 cups)  1/2 cup honey (raw is best, but regular will do)  1/2 lemon chopped Instructions 1. Place the lemon in the pint jar and cover with the honey. The honey will macerate the lemons and draw out liquids which taste so delicious! 2. Meanwhile, toss the thyme leaves into a saucepan and cover them with the water. 3. Bring the water to a gentle simmer and reduce it to half, about a cup of tea. 4. When the tea is reduced and cooled a bit, strain the sprigs & leaves, add it into the pint jar and stir it well. 5. Give it a shake and use a spoonful as needed. 6. Store your homemade cough syrup in the refrigerator for about a month.  Is there anything I can do on my own to get rid of my cough? Yes. To help get rid of your cough, you can: ?Use a humidifier in your bedroom ?Use an over-the-counter cough medicine, or suck on cough drops or hard candy ?Stop smoking, if you smoke ?If you have allergies, avoid the things you are allergic to (like pollen, dust, animals, or mold) If you have acid reflux, your doctor or nurse will tell you which lifestyle changes can help reduce symptoms.    Thank you for coming  in today. I hope you feel we met your needs.  Feel free to call PCP if you have any questions or further requests.  Please consider signing up for MyChart if you do not already have it, as this is a great way to communicate with me.  Best,  Whitney McVey, PA-C   IF you received an x-ray today, you will receive an invoice from [facility]. Please contact [facility] at [phone] with questions or concerns regarding your invoice.   IF you received labwork today, you will receive an invoice from [place]. Please contact [facility] at [phone] with questions or concerns regarding your invoice.   Our billing staff will not be able to assist you with questions regarding bills from these companies.  You will be contacted with the lab results as soon as they are available. The fastest way to get your results is to activate your My Chart account. Instructions are located on the last page of this paperwork. If you have not heard from [place] regarding the results in 2 weeks, please contact this office.

## 2017-09-11 NOTE — Progress Notes (Signed)
Mary Wyatt  MRN: [id number] DOB: [birth year]  PCP: Wardell Honour, MD  Subjective:  Pt is a pleasant 31 year old female who presents to clinic for ear pain. She was here 11/7 for congestion and treated supportively for URI. She has been sick x >3 weeks.  Her symptoms never improved and this morning she woke up with ear ache. Endorses headache and nasal congestion. She is using nasal spray. Denies fever, chills, n/v, sinus pain.   Review of Systems  Constitutional: Negative for chills, diaphoresis, fatigue and fever.  HENT: Positive for congestion, ear pain, rhinorrhea and sinus pressure. Negative for ear discharge, postnasal drip, sinus pain and sore throat.   Respiratory: Negative for cough, shortness of breath and wheezing.   Cardiovascular: Negative for chest pain and palpitations.    Patient Active Problem List   Diagnosis Date Noted  . Anxiety and depression 06/15/2017  . Family history of brain aneurysm 04/19/2017  . Rhinitis, allergic 04/20/2014  . Generalized anxiety disorder 04/20/2014  . Celiac disease 12/15/2012  . Interstitial cystitis     Current Outpatient Medications on File Prior to Visit  Medication Sig Dispense Refill  . diltiazem 2 % GEL Apply 1 application topically 3 (three) times daily. 30 g 1  . docusate sodium (COLACE) 100 MG capsule Take 100 mg by mouth 2 (two) times daily as needed for mild constipation.    . fluticasone (FLONASE) 50 MCG/ACT nasal spray Place 2 sprays daily into both nostrils. 16 g 6  . hydrOXYzine (VISTARIL) 25 MG capsule Take 1 capsule (25 mg total) by mouth every 8 (eight) hours as needed for anxiety or nausea. 120 capsule 1  . KAITLIB FE 0.8-25 MG-MCG tablet as directed.  2  . azelastine (ASTELIN) 0.1 % nasal spray Place 2 sprays into both nostrils 2 (two) times daily. (Patient not taking: Reported on 09/11/2017) 30 mL 11  . benzonatate (TESSALON) 100 MG capsule Take 1 capsule (100 mg total) 2 (two) times daily as needed by  mouth for cough. (Patient not taking: Reported on 09/11/2017) 20 capsule 0   No current facility-administered medications on file prior to visit.     Allergies  Allergen Reactions  . Diflucan [Fluconazole] Hives     Objective:  BP 108/80 (BP Location: Right Arm, Patient Position: Sitting, Cuff Size: Normal)   Pulse 82   Temp 98.4 F (36.9 C) (Oral)   Resp 18   Ht 5' 5"  (1.651 m)   Wt 151 lb 3.2 oz (68.6 kg)   SpO2 100%   BMI 25.16 kg/m   Physical Exam  Constitutional: She is oriented to person, place, and time and well-developed, well-nourished, and in no distress. No distress.  HENT:  Right Ear: Tympanic membrane is bulging. A middle ear effusion is present.  Left Ear: Tympanic membrane is bulging.  Nose: Mucosal edema present. No rhinorrhea. Right sinus exhibits no maxillary sinus tenderness and no frontal sinus tenderness. Left sinus exhibits no maxillary sinus tenderness and no frontal sinus tenderness.  Mouth/Throat: Oropharynx is clear and moist and mucous membranes are normal.  Cardiovascular: Normal rate, regular rhythm and normal heart sounds.  Pulmonary/Chest: Effort normal and breath sounds normal.  Neurological: She is alert and oriented to person, place, and time. GCS score is 15.  Skin: Skin is warm and dry.  Psychiatric: Mood, memory, affect and judgment normal.  Vitals reviewed.   Assessment and Plan :  1. Right otitis media, unspecified otitis media type 2. Right ear pain -  amoxicillin (AMOXIL) 500 MG capsule; Take 2 capsules (1,000 mg total) by mouth 3 (three) times daily for 10 days.  Dispense: 60 capsule; Refill: 0 - Supportive care discussed. RTC in 10 days if no improvement. She agrees with plan.   Mercer Pod, PA-C  [facility] 09/11/2017 2:41 PM

## 2017-09-16 ENCOUNTER — Ambulatory Visit: Payer: BLUE CROSS/BLUE SHIELD | Admitting: Clinical

## 2017-09-16 DIAGNOSIS — F411 Generalized anxiety disorder: Secondary | ICD-10-CM

## 2017-09-25 ENCOUNTER — Ambulatory Visit: Payer: BLUE CROSS/BLUE SHIELD | Admitting: Family Medicine

## 2017-09-25 ENCOUNTER — Other Ambulatory Visit: Payer: Self-pay

## 2017-09-25 ENCOUNTER — Encounter: Payer: Self-pay | Admitting: Family Medicine

## 2017-09-25 VITALS — BP 108/88 | HR 83 | Temp 98.7°F | Resp 16 | Ht 66.14 in | Wt 147.0 lb

## 2017-09-25 DIAGNOSIS — J301 Allergic rhinitis due to pollen: Secondary | ICD-10-CM

## 2017-09-25 DIAGNOSIS — H6501 Acute serous otitis media, right ear: Secondary | ICD-10-CM

## 2017-09-25 DIAGNOSIS — F32A Depression, unspecified: Secondary | ICD-10-CM

## 2017-09-25 DIAGNOSIS — F329 Major depressive disorder, single episode, unspecified: Secondary | ICD-10-CM | POA: Diagnosis not present

## 2017-09-25 DIAGNOSIS — F419 Anxiety disorder, unspecified: Secondary | ICD-10-CM

## 2017-09-25 MED ORDER — AMOXICILLIN 500 MG PO TABS: 1000.0000 mg | ORAL_TABLET | Freq: Two times a day (BID) | ORAL | 0 refills | Status: DC

## 2017-09-25 NOTE — Patient Instructions (Addendum)
   Recommend holding Flonase for one week and then restart. Start using [employer] every day or night. Recommend pseudoephedrine 12 hour formula and take for 5-7 days only.    IF you received an x-ray today, you will receive an invoice from [facility]. Please contact [facility] at [phone] with questions or concerns regarding your invoice.   IF you received labwork today, you will receive an invoice from [place]. Please contact [facility] at [phone] with questions or concerns regarding your invoice.   Our billing staff will not be able to assist you with questions regarding bills from these companies.  You will be contacted with the lab results as soon as they are available. The fastest way to get your results is to activate your My Chart account. Instructions are located on the last page of this paperwork. If you have not heard from [place] regarding the results in 2 weeks, please contact this office.

## 2017-09-25 NOTE — Progress Notes (Signed)
Subjective:    Patient ID: Mary Wyatt, female    DOB: 10-22-1985, 31 y.o.   MRN: [id number]  09/25/2017  Ear Pain (follow-up from 11/23 pt states her ear pain is still present. )    HPI This 31 y.o. female presents for evaluation of LEFT ear pressure and fluid.  Completed Amoxicillin.  Still having nasal congestion and PND; bloody drainage.  Stopped Astelin due to inflamed nasal passages.  Pain in nose.  Has nasal saline but not using due to taste. Has netty pot.  Claritin every morning.  Mostly white drainage; thick and gunky.  Not blowing nose excessively. 75% improved.   Mucinex D makes very productive. Taking hydroxyzine every night for insomnia and anxiety.  Dulls anxiety. Happily unemployed.  Doing better emotionally.    BP Readings from Last 3 Encounters:  09/25/17 108/88  09/11/17 108/80  08/26/17 100/60   Wt Readings from Last 3 Encounters:  09/25/17 147 lb (66.7 kg)  09/11/17 151 lb 3.2 oz (68.6 kg)  08/26/17 146 lb 6.4 oz (66.4 kg)   Immunization History  Administered Date(s) Administered  . Hepatitis A, Adult 05/31/2014, 11/30/2014  . Hepatitis B, adult 05/31/2014, 06/28/2014, 11/30/2014  . Influenza,inj,Quad PF,6+ Mos 07/21/2013, 06/28/2014  . Influenza-Unspecified 07/11/2015, 08/24/2016  . Tdap 03/03/2012    Review of Systems  Constitutional: Negative for chills, diaphoresis, fatigue and fever.  HENT: Positive for congestion, postnasal drip and rhinorrhea. Negative for ear discharge, ear pain, sinus pressure, sinus pain, sore throat, trouble swallowing and voice change.   Eyes: Negative for visual disturbance.  Respiratory: Negative for cough and shortness of breath.   Cardiovascular: Negative for chest pain, palpitations and leg swelling.  Gastrointestinal: Negative for abdominal pain, constipation, diarrhea, nausea and vomiting.  Endocrine: Negative for cold intolerance, heat intolerance, polydipsia, polyphagia and polyuria.  Neurological: Negative  for dizziness, tremors, seizures, syncope, facial asymmetry, speech difficulty, weakness, light-headedness, numbness and headaches.  Psychiatric/Behavioral: Positive for dysphoric mood. Negative for self-injury and suicidal ideas. The patient is nervous/anxious.     Past Medical History:  Diagnosis Date  . Allergy   . Anemia   . Anxiety   . Chest pain   . Depression   . Genital warts   . GERD (gastroesophageal reflux disease)   . Interstitial cystitis   . RMSF [facility] spotted fever) 2015  . Ulcer    Past Surgical History:  Procedure Laterality Date  . OVARIAN CYST REMOVAL    . TONSILLECTOMY     Allergies  Allergen Reactions  . Diflucan [Fluconazole] Hives   Current Outpatient Medications on File Prior to Visit  Medication Sig Dispense Refill  . diltiazem 2 % GEL Apply 1 application topically 3 (three) times daily. 30 g 1  . docusate sodium (COLACE) 100 MG capsule Take 100 mg by mouth 2 (two) times daily as needed for mild constipation.    . fluticasone (FLONASE) 50 MCG/ACT nasal spray Place 2 sprays daily into both nostrils. 16 g 6  . hydrOXYzine (VISTARIL) 25 MG capsule Take 1 capsule (25 mg total) by mouth every 8 (eight) hours as needed for anxiety or nausea. 120 capsule 1  . KAITLIB FE 0.8-25 MG-MCG tablet as directed.  2   No current facility-administered medications on file prior to visit.    Social History   Socioeconomic History  . Marital status: Single    Spouse name: Not on file  . Number of children: 0  . Years of education: Not on file  .  Highest education level: Not on file  Social Needs  . Financial resource strain: Not on file  . Food insecurity - worry: Not on file  . Food insecurity - inability: Not on file  . Transportation needs - medical: Not on file  . Transportation needs - non-medical: Not on file  Occupational History  . Occupation: [employer]  Tobacco Use  . Smoking status: Former Research scientist (life sciences)  . Smokeless tobacco: Never Used  Substance and  Sexual Activity  . Alcohol use: Yes    Alcohol/week: 4.2 oz    Types: 7 Standard drinks or equivalent per week    Comment: Drinks at least 1 drink a day of different things  . Drug use: No  . Sexual activity: Not Currently    Birth control/protection: Pill  Other Topics Concern  . Not on file  Social History Narrative      Marital status:  Single; parents in [place]: none      Employment: [employer] works indoors       Tobacco: none      Alcohol: socially; twice weekly      Drugs: none      Exercise: sporadic; walking sometimes         Daily caffeine    Family History  Problem Relation Age of Onset  . GI problems Sister   . Asthma Sister   . Anxiety disorder Sister   . Heart disease Maternal Grandmother   . Anxiety disorder Mother   . Depression Mother   . Alcohol abuse Maternal Grandfather   . Anxiety disorder Sister   . Cancer Maternal Aunt        breast  . OCD Maternal Aunt   . Colon cancer Neg Hx        Objective:    BP 108/88   Pulse 83   Temp 98.7 F (37.1 C) (Oral)   Resp 16   Ht 5' 6.14" (1.68 m)   Wt 147 lb (66.7 kg)   SpO2 95%   BMI 23.62 kg/m  Physical Exam  Constitutional: She is oriented to person, place, and time. She appears well-developed and well-nourished. No distress.  HENT:  Head: Normocephalic and atraumatic.  Right Ear: Tympanic membrane, external ear and ear canal normal.  Left Ear: Tympanic membrane, external ear and ear canal normal.  Nose: Nose normal.  Mouth/Throat: Oropharynx is clear and moist. No oropharyngeal exudate.  Eyes: Conjunctivae and EOM are normal. Pupils are equal, round, and reactive to light.  Neck: Normal range of motion. Neck supple. Carotid bruit is not present. No thyromegaly present.  Cardiovascular: Normal rate, regular rhythm, normal heart sounds and intact distal pulses. Exam reveals no gallop and no friction rub.  No murmur heard. Pulmonary/Chest: Effort normal and breath sounds  normal. She has no wheezes. She has no rales.  Lymphadenopathy:    She has no cervical adenopathy.  Neurological: She is alert and oriented to person, place, and time. No cranial nerve deficit. Coordination normal.  Skin: Skin is warm and dry. No rash noted. She is not diaphoretic. No erythema. No pallor.  Psychiatric: She has a normal mood and affect. Her behavior is normal. Judgment and thought content normal.   No results found. Depression screen [facility] 2/9 09/25/2017 09/11/2017 08/26/2017 07/10/2017 05/27/2017  Decreased Interest 0 0 0 0 0  Down, Depressed, Hopeless 0 0 0 0 0  PHQ - 2 Score 0 0 0 0 0   Fall Risk  09/25/2017 09/11/2017 08/26/2017 07/10/2017 05/27/2017  Falls in the past year? No No No No No        Assessment & Plan:   1. Right acute serous otitis media, recurrence not specified   2. Anxiety and depression   3. Seasonal allergic rhinitis due to pollen    Resolved acute otitis media.  75% improved since taking amoxicillin. Hold Flonase for 1 week and then restart due to nasal congestion that is bloody. Recommend starting pseudoephedrine 12-hour formula twice daily for 5-7 days. Continue Nettie pot daily. Provided with a prescription for amoxicillin to treat residual sinusitis if symptoms have not improved in 1 week. Anxiety and depression have improved since presenting from previous employment.  Doing well at this time.  Recommend regular daily exercise for stress management.  No orders of the defined types were placed in this encounter.  Meds ordered this encounter  Medications  . amoxicillin (AMOXIL) 500 MG tablet    Sig: Take 2 tablets (1,000 mg total) by mouth 2 (two) times daily.    Dispense:  40 tablet    Refill:  0    No Follow-up on file.   Chenel Wernli Elayne Guerin, M.D. Primary Care at [facility] previously Urgent [place] [address] [place], [place]  [address] [phone] phone [phone] fax

## 2017-10-07 ENCOUNTER — Ambulatory Visit (INDEPENDENT_AMBULATORY_CARE_PROVIDER_SITE_OTHER): Payer: BLUE CROSS/BLUE SHIELD | Admitting: Clinical

## 2017-10-07 DIAGNOSIS — F411 Generalized anxiety disorder: Secondary | ICD-10-CM

## 2017-10-10 ENCOUNTER — Other Ambulatory Visit: Payer: Self-pay

## 2017-10-10 ENCOUNTER — Ambulatory Visit: Payer: BLUE CROSS/BLUE SHIELD | Admitting: Family Medicine

## 2017-10-10 ENCOUNTER — Encounter: Payer: Self-pay | Admitting: Family Medicine

## 2017-10-10 VITALS — BP 108/66 | HR 80 | Temp 99.1°F | Resp 18 | Ht 65.83 in | Wt 150.2 lb

## 2017-10-10 DIAGNOSIS — N3010 Interstitial cystitis (chronic) without hematuria: Secondary | ICD-10-CM

## 2017-10-10 DIAGNOSIS — R350 Frequency of micturition: Secondary | ICD-10-CM | POA: Diagnosis not present

## 2017-10-10 DIAGNOSIS — F32A Depression, unspecified: Secondary | ICD-10-CM

## 2017-10-10 DIAGNOSIS — F419 Anxiety disorder, unspecified: Secondary | ICD-10-CM | POA: Diagnosis not present

## 2017-10-10 DIAGNOSIS — J029 Acute pharyngitis, unspecified: Secondary | ICD-10-CM | POA: Diagnosis not present

## 2017-10-10 DIAGNOSIS — H6982 Other specified disorders of Eustachian tube, left ear: Secondary | ICD-10-CM | POA: Diagnosis not present

## 2017-10-10 DIAGNOSIS — F329 Major depressive disorder, single episode, unspecified: Secondary | ICD-10-CM

## 2017-10-10 LAB — POCT CBC
Granulocyte percent: 60.7 %G (ref 37–80)
HCT, POC: 39.2 % (ref 37.7–47.9)
Hemoglobin: 13.3 g/dL (ref 12.2–16.2)
Lymph, poc: 1.5 (ref 0.6–3.4)
MCH: 31.7 pg — AB (ref 27–31.2)
MCHC: 34 g/dL (ref 31.8–35.4)
MCV: 93.2 fL (ref 80–97)
MID (CBC): 0.4 (ref 0–0.9)
MPV: 7.6 fL (ref 0–99.8)
POC Granulocyte: 2.9 (ref 2–6.9)
POC LYMPH PERCENT: 31.6 %L (ref 10–50)
POC MID %: 7.7 % (ref 0–12)
Platelet Count, POC: 262 10*3/uL (ref 142–424)
RBC: 4.21 M/uL (ref 4.04–5.48)
RDW, POC: 12.1 %
WBC: 4.7 10*3/uL (ref 4.6–10.2)

## 2017-10-10 LAB — POCT URINALYSIS DIP (MANUAL ENTRY)
BILIRUBIN UA: NEGATIVE
GLUCOSE UA: NEGATIVE mg/dL
Leukocytes, UA: NEGATIVE
Nitrite, UA: NEGATIVE
Protein Ur, POC: NEGATIVE mg/dL
SPEC GRAV UA: 1.025 (ref 1.010–1.025)
Urobilinogen, UA: 0.2 E.U./dL
pH, UA: 7 (ref 5.0–8.0)

## 2017-10-10 LAB — POC MICROSCOPIC URINALYSIS (UMFC): Mucus: ABSENT

## 2017-10-10 MED ORDER — PREDNISONE 20 MG PO TABS: ORAL_TABLET | ORAL | 0 refills | Status: DC

## 2017-10-10 NOTE — Progress Notes (Signed)
Subjective:    Patient ID: Mary Wyatt, female    DOB: 09/09/1986, 31 y.o.   MRN: [id number]  10/10/2017  Sinus Problem (f/u- with ears and headache) and Depression (sreening done & GAD7)    HPI This 31 y.o. female presents for evaluation of urinary frequency and nocturia and persistent sinus symptoms.  Onset two days ago.  Started Amoxicillin ten days ago. Denies dysuria, urgency, vaginal discharge, vaginal pain.   Has one more dose of Amoxicillin.  Having L ear fluid, headaches.  Netti pot and running humidifier.  Sinuses have been so intensely dry.  Stopped nasal spray.  Some hydroxyzine at night; taking Claritin.  Suffering with urinary frequency for the past several weeks. Denies fever/chills/sweats.  Ongoing intermittent anxiety and low mood.  Currently unemployed which has been a positive impact yet needs to find a job.  Denies suicidal ideation.  BP Readings from Last 3 Encounters:  10/31/17 122/82  10/10/17 108/66  09/25/17 108/88   Wt Readings from Last 3 Encounters:  10/31/17 151 lb 9.6 oz (68.8 kg)  10/10/17 150 lb 3.2 oz (68.1 kg)  09/25/17 147 lb (66.7 kg)   Immunization History  Administered Date(s) Administered  . Hepatitis A, Adult 05/31/2014, 11/30/2014  . Hepatitis B, adult 05/31/2014, 06/28/2014, 11/30/2014  . Influenza,inj,Quad PF,6+ Mos 07/21/2013, 06/28/2014  . Influenza-Unspecified 07/11/2015, 08/24/2016  . Tdap 03/03/2012    Review of Systems  Constitutional: Negative for chills, diaphoresis, fatigue and fever.  HENT: Positive for congestion, ear pain, postnasal drip, rhinorrhea, sinus pressure, sinus pain and sore throat. Negative for drooling, ear discharge, facial swelling, hearing loss, mouth sores, sneezing, tinnitus, trouble swallowing and voice change.   Eyes: Negative for visual disturbance.  Respiratory: Negative for cough and shortness of breath.   Cardiovascular: Negative for chest pain, palpitations and leg swelling.    Gastrointestinal: Negative for abdominal pain, constipation, diarrhea, nausea and vomiting.  Endocrine: Negative for cold intolerance, heat intolerance, polydipsia, polyphagia and polyuria.  Genitourinary: Positive for frequency. Negative for decreased urine volume, dysuria, enuresis, flank pain, pelvic pain, urgency, vaginal bleeding, vaginal discharge and vaginal pain.  Neurological: Negative for dizziness, tremors, seizures, syncope, facial asymmetry, speech difficulty, weakness, light-headedness, numbness and headaches.  Psychiatric/Behavioral: Positive for dysphoric mood. Negative for sleep disturbance and suicidal ideas. The patient is nervous/anxious.     Past Medical History:  Diagnosis Date  . Allergy   . Anemia   . Anxiety   . Chest pain   . Depression   . Genital warts   . GERD (gastroesophageal reflux disease)   . Interstitial cystitis   . RMSF [facility] spotted fever) 2015  . Ulcer    Past Surgical History:  Procedure Laterality Date  . OVARIAN CYST REMOVAL    . TONSILLECTOMY     Allergies  Allergen Reactions  . Diflucan [Fluconazole] Hives   Current Outpatient Medications on File Prior to Visit  Medication Sig Dispense Refill  . amoxicillin (AMOXIL) 500 MG tablet Take 2 tablets (1,000 mg total) by mouth 2 (two) times daily. (Patient not taking: Reported on 10/31/2017) 40 tablet 0  . diltiazem 2 % GEL Apply 1 application topically 3 (three) times daily. (Patient not taking: Reported on 10/31/2017) 30 g 1  . docusate sodium (COLACE) 100 MG capsule Take 100 mg by mouth 2 (two) times daily as needed for mild constipation.    . fluticasone (FLONASE) 50 MCG/ACT nasal spray Place 2 sprays daily into both nostrils. 16 g 6  . hydrOXYzine (  VISTARIL) 25 MG capsule Take 1 capsule (25 mg total) by mouth every 8 (eight) hours as needed for anxiety or nausea. 120 capsule 1  . KAITLIB FE 0.8-25 MG-MCG tablet as directed.  2   No current facility-administered medications on  file prior to visit.    Social History   Socioeconomic History  . Marital status: Single    Spouse name: Not on file  . Number of children: 0  . Years of education: Not on file  . Highest education level: Not on file  Social Needs  . Financial resource strain: Not on file  . Food insecurity - worry: Not on file  . Food insecurity - inability: Not on file  . Transportation needs - medical: Not on file  . Transportation needs - non-medical: Not on file  Occupational History  . Occupation: [employer]  Tobacco Use  . Smoking status: Former Research scientist (life sciences)  . Smokeless tobacco: Never Used  Substance and Sexual Activity  . Alcohol use: Yes    Alcohol/week: 4.2 oz    Types: 7 Standard drinks or equivalent per week    Comment: Drinks at least 1 drink a day of different things  . Drug use: No  . Sexual activity: Yes    Birth control/protection: Pill  Other Topics Concern  . Not on file  Social History Narrative      Marital status:  Single; parents in [place]: none      Employment: [employer] works indoors       Tobacco: none      Alcohol: socially; twice weekly      Drugs: none      Exercise: sporadic; walking sometimes         Daily caffeine    Family History  Problem Relation Age of Onset  . GI problems Sister   . Asthma Sister   . Anxiety disorder Sister   . Heart disease Maternal Grandmother   . Anxiety disorder Mother   . Depression Mother   . Alcohol abuse Maternal Grandfather   . Anxiety disorder Sister   . Cancer Maternal Aunt        breast  . OCD Maternal Aunt   . Colon cancer Neg Hx        Objective:    BP 108/66 (BP Location: Left Arm, Patient Position: Sitting, Cuff Size: Normal)   Pulse 80   Temp 99.1 F (37.3 C) (Oral)   Resp 18   Ht 5' 5.83" (1.672 m)   Wt 150 lb 3.2 oz (68.1 kg)   LMP 09/28/2017 (Approximate)   SpO2 98%   BMI 24.37 kg/m  Physical Exam  Constitutional: She is oriented to person, place, and time. She appears  well-developed and well-nourished. No distress.  HENT:  Head: Normocephalic and atraumatic.  Right Ear: External ear normal.  Left Ear: External ear normal.  Nose: Nose normal.  Mouth/Throat: Oropharynx is clear and moist. No oropharyngeal exudate.  Eyes: Conjunctivae and EOM are normal. Pupils are equal, round, and reactive to light.  Neck: Normal range of motion. Neck supple. Carotid bruit is not present. No thyromegaly present.  Cardiovascular: Normal rate, regular rhythm, normal heart sounds and intact distal pulses. Exam reveals no gallop and no friction rub.  No murmur heard. Pulmonary/Chest: Effort normal and breath sounds normal. She has no wheezes. She has no rales.  Abdominal: Soft. Bowel sounds are normal. She exhibits no distension and no mass. There is no tenderness. There  is no rebound and no guarding.  Lymphadenopathy:    She has no cervical adenopathy.  Neurological: She is alert and oriented to person, place, and time. No cranial nerve deficit.  Skin: Skin is warm and dry. No rash noted. She is not diaphoretic. No erythema. No pallor.  Psychiatric: She has a normal mood and affect. Her behavior is normal.   No results found. Depression screen [facility] 2/9 10/31/2017 10/10/2017 09/25/2017 09/11/2017 08/26/2017  Decreased Interest 1 0 0 0 0  Down, Depressed, Hopeless 1 1 0 0 0  PHQ - 2 Score 2 1 0 0 0  Altered sleeping 3 0 - - -  Tired, decreased energy 3 1 - - -  Change in appetite 0 0 - - -  Feeling bad or failure about yourself  0 0 - - -  Trouble concentrating 0 0 - - -  Moving slowly or fidgety/restless 0 0 - - -  Suicidal thoughts 0 0 - - -  PHQ-9 Score 8 2 - - -  Difficult doing work/chores Very difficult Somewhat difficult - - -   Fall Risk  10/31/2017 10/10/2017 09/25/2017 09/11/2017 08/26/2017  Falls in the past year? No No No No No    Results for orders placed or performed in visit on 10/10/17  POCT urinalysis dipstick  Result Value Ref Range   Color, UA yellow  yellow   Clarity, UA clear clear   Glucose, UA negative negative mg/dL   Bilirubin, UA negative negative   Ketones, POC UA trace (5) (A) negative mg/dL   Spec Grav, UA 1.025 1.010 - 1.025   Blood, UA trace-intact (A) negative   pH, UA 7.0 5.0 - 8.0   Protein Ur, POC negative negative mg/dL   Urobilinogen, UA 0.2 0.2 or 1.0 E.U./dL   Nitrite, UA Negative Negative   Leukocytes, UA Negative Negative  POCT Microscopic Urinalysis ([facility])  Result Value Ref Range   WBC,UR,HPF,POC Few (A) None WBC/hpf   RBC,UR,HPF,POC None None RBC/hpf   Bacteria None None, Too numerous to count   Mucus Absent Absent   Epithelial Cells, UR Per Microscopy Many (A) None, Too numerous to count cells/hpf  POCT CBC  Result Value Ref Range   WBC 4.7 4.6 - 10.2 K/uL   Lymph, poc 1.5 0.6 - 3.4   POC LYMPH PERCENT 31.6 10 - 50 %L   MID (cbc) 0.4 0 - 0.9   POC MID % 7.7 0 - 12 %M   POC Granulocyte 2.9 2 - 6.9   Granulocyte percent 60.7 37 - 80 %G   RBC 4.21 4.04 - 5.48 M/uL   Hemoglobin 13.3 12.2 - 16.2 g/dL   HCT, POC 39.2 37.7 - 47.9 %   MCV 93.2 80 - 97 fL   MCH, POC 31.7 (A) 27 - 31.2 pg   MCHC 34.0 31.8 - 35.4 g/dL   RDW, POC 12.1 %   Platelet Count, POC 262 142 - 424 K/uL   MPV 7.6 0 - 99.8 fL       Assessment & Plan:   1. Urinary frequency   2. Sore throat   3. Dysfunction of left eustachian tube   4. Anxiety and depression   5. Interstitial cystitis     New onset urinary frequency.  Urine normal in office today.  Reassurance provided.  Return to clinic for development of worsening symptoms or vaginal symptoms.  Persistent sore throat and left eustachian tube dysfunction.  Status post amoxicillin therapy.  Prescription from  redness and provided for ongoing inflammation.  Continues to suffer with intermittent anxiety and dysthymia.  Coping well at this time.  Good support in [place].  Encourage daily exercise depression and anxiety management.  Orders Placed This Encounter  Procedures    . POCT urinalysis dipstick  . POCT Microscopic Urinalysis ([facility])  . POCT CBC   Meds ordered this encounter  Medications  . predniSONE (DELTASONE) 20 MG tablet    Sig: Two tablets daily x 5 days then one tablet daily x 5 days    Dispense:  15 tablet    Refill:  0    No Follow-up on file.   Euna Armon Elayne Guerin, M.D. Primary Care at [facility] previously Urgent [place] [address] [place],   [address] [phone] phone [phone] fax

## 2017-10-10 NOTE — Patient Instructions (Addendum)
IF you received an x-ray today, you will receive an invoice from [facility]. Please contact [facility]iology at [phone] with questions or concerns regarding your invoice.   IF you received labwork today, you will receive an invoice from [place]. Please contact [facility] at [phone] with questions or concerns regarding your invoice.   Our billing staff will not be able to assist you with questions regarding bills from these companies.  You will be contacted with the lab results as soon as they are available. The fastest way to get your results is to activate your My Chart account. Instructions are located on the last page of this paperwork. If you have not heard from [place] regarding the results in 2 weeks, please contact this office.     Eustachian Tube Dysfunction The eustachian tube connects the middle ear to the back of the nose. It regulates air pressure in the middle ear by allowing air to move between the ear and nose. It also helps to drain fluid from the middle ear space. When the eustachian tube does not function properly, air pressure, fluid, or both can build up in the middle ear. Eustachian tube dysfunction can affect one or both ears. What are the causes? This condition happens when the eustachian tube becomes blocked or cannot open normally. This may result from:  Ear infections.  Colds and other upper respiratory infections.  Allergies.  Irritation, such as from cigarette smoke or acid from the stomach coming up into the esophagus (gastroesophageal reflux).  Sudden changes in air pressure, such as from descending in an airplane.  Abnormal growths in the nose or throat, such as nasal polyps, tumors, or enlarged tissue at the back of the throat (adenoids).  What increases the risk? This condition may be more likely to develop in people who smoke and people who are overweight. Eustachian tube dysfunction may also be more likely to develop in  children, especially children who have:  Certain birth defects of the mouth, such as cleft palate.  Large tonsils and adenoids.  What are the signs or symptoms? Symptoms of this condition may include:  A feeling of fullness in the ear.  Ear pain.  Clicking or popping noises in the ear.  Ringing in the ear.  Hearing loss.  Loss of balance.  Symptoms may get worse when the air pressure around you changes, such as when you travel to an area of high elevation or fly on an airplane. How is this diagnosed? This condition may be diagnosed based on:  Your symptoms.  A physical exam of your ear, nose, and throat.  Tests, such as those that measure: ? The movement of your eardrum (tympanogram). ? Your hearing (audiometry).  How is this treated? Treatment depends on the cause and severity of your condition. If your symptoms are mild, you may be able to relieve your symptoms by moving air into ("popping") your ears. If you have symptoms of fluid in your ears, treatment may include:  Decongestants.  Antihistamines.  Nasal sprays or ear drops that contain medicines that reduce swelling (steroids).  In some cases, you may need to have a procedure to drain the fluid in your eardrum (myringotomy). In this procedure, a small tube is placed in the eardrum to:  Drain the fluid.  Restore the air in the middle ear space.  Follow these instructions at home:  Take over-the-counter and prescription medicines only as told by your health care provider.  Use techniques to help pop  your ears as recommended by your health care provider. These may include: ? Chewing gum. ? Yawning. ? Frequent, forceful swallowing. ? Closing your mouth, holding your nose closed, and gently blowing as if you are trying to blow air out of your nose.  Do not do any of the following until your health care provider approves: ? Travel to high altitudes. ? Fly in airplanes. ? Work in a Pension scheme manager or  room. ? Scuba dive.  Keep your ears dry. Dry your ears completely after showering or bathing.  Do not smoke.  Keep all follow-up visits as told by your health care provider. This is important. Contact a health care provider if:  Your symptoms do not go away after treatment.  Your symptoms come back after treatment.  You are unable to pop your ears.  You have: ? A fever. ? Pain in your ear. ? Pain in your head or neck. ? Fluid draining from your ear.  Your hearing suddenly changes.  You become very dizzy.  You lose your balance. This information is not intended to replace advice given to you by your health care provider. Make sure you discuss any questions you have with your health care provider. Document Released: 11/02/2015 Document Revised: 03/13/2016 Document Reviewed: 10/25/2014 Elsevier Interactive Patient Education  [employer].

## 2017-10-21 ENCOUNTER — Ambulatory Visit (INDEPENDENT_AMBULATORY_CARE_PROVIDER_SITE_OTHER): Payer: BLUE CROSS/BLUE SHIELD | Admitting: Clinical

## 2017-10-21 DIAGNOSIS — F411 Generalized anxiety disorder: Secondary | ICD-10-CM | POA: Diagnosis not present

## 2017-10-31 ENCOUNTER — Encounter: Payer: Self-pay | Admitting: Family Medicine

## 2017-10-31 ENCOUNTER — Ambulatory Visit: Payer: BLUE CROSS/BLUE SHIELD | Admitting: Family Medicine

## 2017-10-31 ENCOUNTER — Other Ambulatory Visit: Payer: Self-pay

## 2017-10-31 VITALS — BP 122/82 | HR 65 | Temp 97.8°F | Resp 18 | Ht 65.75 in | Wt 151.6 lb

## 2017-10-31 DIAGNOSIS — Z7251 High risk heterosexual behavior: Secondary | ICD-10-CM

## 2017-10-31 DIAGNOSIS — R102 Pelvic and perineal pain: Secondary | ICD-10-CM | POA: Diagnosis not present

## 2017-10-31 DIAGNOSIS — N644 Mastodynia: Secondary | ICD-10-CM | POA: Diagnosis not present

## 2017-10-31 LAB — POCT URINE PREGNANCY: PREG TEST UR: NEGATIVE

## 2017-10-31 LAB — POCT URINALYSIS DIP (MANUAL ENTRY)
Bilirubin, UA: NEGATIVE
GLUCOSE UA: NEGATIVE mg/dL
Ketones, POC UA: NEGATIVE mg/dL
LEUKOCYTES UA: NEGATIVE
Nitrite, UA: NEGATIVE
PROTEIN UA: NEGATIVE mg/dL
Spec Grav, UA: <=1.005 — AB (ref 1.010–1.025)
UROBILINOGEN UA: 0.2 U/dL
pH, UA: 5.5 (ref 5.0–8.0)

## 2017-10-31 NOTE — Progress Notes (Signed)
Subjective:    Patient ID: Mary Wyatt, female    DOB: 08/05/1986, 32 y.o.   MRN: [id number]  10/31/2017  Breast Pain (X 2 weeks - pt states having pain under the nipple of  right breast) and Depression    HPI This 32 y.o. female presents for evaluation of R BREAST PAIN UNDER NIPPLE.  History of breast cyst.  S/p mammogram a few years ago. If rubs against something and then lingers.  Getting worse.  Changing in two weeks.    Typically only gets a few periods per year which is nice.Had a period last month and having one this month/now; really heavy and horrible cramping.  Noticed too, after orgasm has bad pain in abdomen.   R stabbing pain in abdomen. Worried about constipation/need to have a bowel movement.  Always R sided.  Was sexually active; broke up with partner.  Concerned about STDs. This new partner 2.5 months ago; last exposure two weeks ago. Bleeding some with sex.    Dr. Corinna Capra at [facility].  In 204, had R sided pain; CT scan was normal; pelvic US normal.  Found ovarian cyst and endometriosis and scar tissue.  Last gynecological exam 05/2017.     BP Readings from Last 3 Encounters:  10/31/17 122/82  10/10/17 108/66  09/25/17 108/88   Wt Readings from Last 3 Encounters:  10/31/17 151 lb 9.6 oz (68.8 kg)  10/10/17 150 lb 3.2 oz (68.1 kg)  09/25/17 147 lb (66.7 kg)   Immunization History  Administered Date(s) Administered  . Hepatitis A, Adult 05/31/2014, 11/30/2014  . Hepatitis B, adult 05/31/2014, 06/28/2014, 11/30/2014  . Influenza,inj,Quad PF,6+ Mos 07/21/2013, 06/28/2014  . Influenza-Unspecified 07/11/2015, 08/24/2016  . Tdap 03/03/2012    Review of Systems  Constitutional: Negative for chills, diaphoresis, fatigue and fever.  Eyes: Negative for visual disturbance.  Respiratory: Negative for cough and shortness of breath.   Cardiovascular: Negative for chest pain, palpitations and leg swelling.  Gastrointestinal: Positive for abdominal pain.  Negative for abdominal distention, anal bleeding, blood in stool, constipation, diarrhea, nausea and vomiting.  Endocrine: Negative for cold intolerance, heat intolerance, polydipsia, polyphagia and polyuria.  Genitourinary: Positive for menstrual problem and pelvic pain. Negative for decreased urine volume, difficulty urinating, dyspareunia, dysuria, enuresis, flank pain, frequency, genital sores, hematuria, urgency, vaginal bleeding, vaginal discharge and vaginal pain.  Neurological: Negative for dizziness, tremors, seizures, syncope, facial asymmetry, speech difficulty, weakness, light-headedness, numbness and headaches.    Past Medical History:  Diagnosis Date  . Allergy   . Anemia   . Anxiety   . Chest pain   . Depression   . Genital warts   . GERD (gastroesophageal reflux disease)   . Interstitial cystitis   . RMSF [facility] spotted fever) 2015  . Ulcer    Past Surgical History:  Procedure Laterality Date  . OVARIAN CYST REMOVAL    . TONSILLECTOMY     Allergies  Allergen Reactions  . Diflucan [Fluconazole] Hives   Current Outpatient Medications on File Prior to Visit  Medication Sig Dispense Refill  . docusate sodium (COLACE) 100 MG capsule Take 100 mg by mouth 2 (two) times daily as needed for mild constipation.    . fluticasone (FLONASE) 50 MCG/ACT nasal spray Place 2 sprays daily into both nostrils. 16 g 6  . hydrOXYzine (VISTARIL) 25 MG capsule Take 1 capsule (25 mg total) by mouth every 8 (eight) hours as needed for anxiety or nausea. 120 capsule 1  . KAITLIB  FE 0.8-25 MG-MCG tablet as directed.  2  . amoxicillin (AMOXIL) 500 MG tablet Take 2 tablets (1,000 mg total) by mouth 2 (two) times daily. (Patient not taking: Reported on 10/31/2017) 40 tablet 0  . diltiazem 2 % GEL Apply 1 application topically 3 (three) times daily. (Patient not taking: Reported on 10/31/2017) 30 g 1  . predniSONE (DELTASONE) 20 MG tablet Two tablets daily x 5 days then one tablet daily x 5  days (Patient not taking: Reported on 10/31/2017) 15 tablet 0   No current facility-administered medications on file prior to visit.    Social History   Socioeconomic History  . Marital status: Single    Spouse name: Not on file  . Number of children: 0  . Years of education: Not on file  . Highest education level: Not on file  Social Needs  . Financial resource strain: Not on file  . Food insecurity - worry: Not on file  . Food insecurity - inability: Not on file  . Transportation needs - medical: Not on file  . Transportation needs - non-medical: Not on file  Occupational History  . Occupation: [employer]  Tobacco Use  . Smoking status: Former Research scientist (life sciences)  . Smokeless tobacco: Never Used  Substance and Sexual Activity  . Alcohol use: Yes    Alcohol/week: 4.2 oz    Types: 7 Standard drinks or equivalent per week    Comment: Drinks at least 1 drink a day of different things  . Drug use: No  . Sexual activity: Yes    Birth control/protection: Pill  Other Topics Concern  . Not on file  Social History Narrative      Marital status:  Single; parents in [place]: none      Employment: [place] [employer] works indoors       Tobacco: none      Alcohol: socially; twice weekly      Drugs: none      Exercise: sporadic; walking sometimes         Daily caffeine    Family History  Problem Relation Age of Onset  . GI problems Sister   . Asthma Sister   . Anxiety disorder Sister   . Heart disease Maternal Grandmother   . Anxiety disorder Mother   . Depression Mother   . Alcohol abuse Maternal Grandfather   . Anxiety disorder Sister   . Cancer Maternal Aunt        breast  . OCD Maternal Aunt   . Colon cancer Neg Hx        Objective:    BP 122/82 (BP Location: Left Arm, Patient Position: Sitting, Cuff Size: Normal)   Pulse 65   Temp 97.8 F (36.6 C) (Oral)   Resp 18   Ht 5' 5.75" (1.67 m)   Wt 151 lb 9.6 oz (68.8 kg)   LMP 10/31/2017 (Approximate)   SpO2 100%    BMI 24.66 kg/m  Physical Exam  Constitutional: She is oriented to person, place, and time. She appears well-developed and well-nourished. No distress.  HENT:  Head: Normocephalic and atraumatic.  Right Ear: External ear normal.  Left Ear: External ear normal.  Nose: Nose normal.  Mouth/Throat: Oropharynx is clear and moist.  Eyes: Conjunctivae and EOM are normal. Pupils are equal, round, and reactive to light.  Neck: Normal range of motion. Neck supple. Carotid bruit is not present. No thyromegaly present.  Cardiovascular: Normal rate, regular rhythm, normal heart sounds and  intact distal pulses. Exam reveals no gallop and no friction rub.  No murmur heard. Pulmonary/Chest: Effort normal and breath sounds normal. She has no wheezes. She has no rales. Right breast exhibits tenderness. Right breast exhibits no inverted nipple, no mass, no nipple discharge and no skin change. Left breast exhibits no inverted nipple, no mass, no nipple discharge, no skin change and no tenderness. Breasts are symmetrical.  Abdominal: Soft. Bowel sounds are normal. She exhibits no distension and no mass. There is no tenderness. There is no rebound and no guarding.  Lymphadenopathy:    She has no cervical adenopathy.  Neurological: She is alert and oriented to person, place, and time. No cranial nerve deficit.  Skin: Skin is warm and dry. No rash noted. She is not diaphoretic. No erythema. No pallor.  Psychiatric: She has a normal mood and affect. Her behavior is normal.   No results found. Depression screen [facility] 2/9 10/31/2017 10/10/2017 09/25/2017 09/11/2017 08/26/2017  Decreased Interest 1 0 0 0 0  Down, Depressed, Hopeless 1 1 0 0 0  PHQ - 2 Score 2 1 0 0 0  Altered sleeping 3 0 - - -  Tired, decreased energy 3 1 - - -  Change in appetite 0 0 - - -  Feeling bad or failure about yourself  0 0 - - -  Trouble concentrating 0 0 - - -  Moving slowly or fidgety/restless 0 0 - - -  Suicidal thoughts 0 0 - - -    PHQ-9 Score 8 2 - - -  Difficult doing work/chores Very difficult Somewhat difficult - - -   Fall Risk  10/31/2017 10/10/2017 09/25/2017 09/11/2017 08/26/2017  Falls in the past year? No No No No No        Assessment & Plan:   1. Breast pain, right   2. High risk heterosexual behavior   3. Pelvic pain in female     New onset right breast pain.  Refer for diagnostic right mammogram with right breast ultrasound.  Urine pregnancy  test negative.  New onset right-sided pelvic pain.  Obtain urinalysis, urine pregnancy, urine cx.  Patient will return in the morning for URiprobe to rule out STD.  Currently on menses, so pelvic exam not performed.  If persistent and testing negative, recommend follow-up with gynecology.  May warrant pelvic ultrasound to evaluate ovaries.  Also concern of constipation; thus, recommend increasing fiber intake and water intake.  Also recommend increasing exercise to help with regular bowel movements.  High risk sexual behavior.  Obtain STD screening including gonorrhea, chlamydia, syphilis, HIV.  Safe sexual practices reviewed in detail during visit.  Orders Placed This Encounter  Procedures  . Urine Culture  . MM Digital Diagnostic Unilat R    Standing Status:   Future    Standing Expiration Date:   12/30/2018    Order Specific Question:   Reason for Exam (SYMPTOM  OR DIAGNOSIS REQUIRED)    Answer:   R breast pain 6 o'clock    Order Specific Question:   Is the patient pregnant?    Answer:   No    Order Specific Question:   Preferred imaging location?    Answer:   [facility]  . US BREAST LTD UNI RIGHT INC AXILLA    Standing Status:   Future    Standing Expiration Date:   12/30/2018    Order Specific Question:   Reason for Exam (SYMPTOM  OR DIAGNOSIS REQUIRED)    Answer:  R breast pain at 6 o'clock    Order Specific Question:   Preferred imaging location?    Answer:   [facility]  . RPR  . HIV antibody  . POCT urine pregnancy  . POCT  urinalysis dipstick   No orders of the defined types were placed in this encounter.   No Follow-up on file.   Aniaya Bacha Elayne Guerin, M.D. Primary Care at [facility] previously [facility] [address] [place], [place]  [address] [phone] phone [phone] fax

## 2017-10-31 NOTE — Patient Instructions (Signed)
     IF you received an x-ray today, you will receive an invoice from  Radiology. Please contact  Radiology at [phone] with questions or concerns regarding your invoice.   IF you received labwork today, you will receive an invoice from [facility]. Please contact [facility] at [phone] with questions or concerns regarding your invoice.   Our billing staff will not be able to assist you with questions regarding bills from these companies.  You will be contacted with the lab results as soon as they are available. The fastest way to get your results is to activate your My Chart account. Instructions are located on the last page of this paperwork. If you have not heard from us regarding the results in 2 weeks, please contact this office.     

## 2017-11-01 LAB — RPR: RPR: NONREACTIVE

## 2017-11-01 LAB — HIV ANTIBODY (ROUTINE TESTING W REFLEX): HIV Screen 4th Generation wRfx: NONREACTIVE

## 2017-11-02 ENCOUNTER — Other Ambulatory Visit: Payer: Self-pay | Admitting: Family Medicine

## 2017-11-02 DIAGNOSIS — Z7251 High risk heterosexual behavior: Secondary | ICD-10-CM | POA: Diagnosis not present

## 2017-11-02 LAB — URINE CULTURE

## 2017-11-02 MED ORDER — AMOXICILLIN 500 MG PO TABS: 1000.0000 mg | ORAL_TABLET | Freq: Two times a day (BID) | ORAL | 0 refills | Status: DC

## 2017-11-03 ENCOUNTER — Ambulatory Visit (INDEPENDENT_AMBULATORY_CARE_PROVIDER_SITE_OTHER): Payer: BLUE CROSS/BLUE SHIELD | Admitting: Clinical

## 2017-11-03 ENCOUNTER — Telehealth: Payer: Self-pay | Admitting: Family Medicine

## 2017-11-03 DIAGNOSIS — F411 Generalized anxiety disorder: Secondary | ICD-10-CM | POA: Diagnosis not present

## 2017-11-03 NOTE — Telephone Encounter (Signed)
Copied from [place] [id number]. Topic: Quick Communication - See Telephone Encounter >> Nov 03, 2017  1:48 PM Bea Graff, NT wrote: CRM for notification. See Telephone encounter for: Pt went to pick up her rx for amoxcillin and [facility] told her they are waiting to hear back from her doctor. I see that the medicine was called in yesterday and pt is also unsure of what the pharmacy actually needs. Please advise.   11/03/17.

## 2017-11-04 LAB — CHLAMYDIA/GC NAA, CONFIRMATION
Chlamydia trachomatis, NAA: NEGATIVE
Neisseria gonorrhoeae, NAA: NEGATIVE

## 2017-11-04 NOTE — Telephone Encounter (Signed)
Received request from pharmacy with note stating to verify dose of amoxicillin 517m.  Rx written as 3.5 day supply. Sig: Take 2 capsules by mouth twice a day Qty:14. Did you want a 1 week supply?

## 2017-11-05 MED ORDER — AMOXICILLIN 500 MG PO TABS: 500.0000 mg | ORAL_TABLET | Freq: Two times a day (BID) | ORAL | 0 refills | Status: DC

## 2017-11-05 NOTE — Telephone Encounter (Signed)
Rx sig corrected and [facility] to [facility].

## 2017-11-09 ENCOUNTER — Other Ambulatory Visit: Payer: Self-pay

## 2017-11-09 ENCOUNTER — Encounter: Payer: Self-pay | Admitting: Family Medicine

## 2017-11-09 ENCOUNTER — Ambulatory Visit: Payer: BLUE CROSS/BLUE SHIELD | Admitting: Family Medicine

## 2017-11-09 VITALS — BP 110/78 | HR 85 | Temp 98.3°F | Ht 66.0 in | Wt 153.6 lb

## 2017-11-09 DIAGNOSIS — H6982 Other specified disorders of Eustachian tube, left ear: Secondary | ICD-10-CM

## 2017-11-09 MED ORDER — TRIAMCINOLONE ACETONIDE 55 MCG/ACT NA AERO: 1.0000 | INHALATION_SPRAY | Freq: Two times a day (BID) | NASAL | 12 refills | Status: DC

## 2017-11-09 NOTE — Patient Instructions (Addendum)
1. Stop flonase, start nasa Eustachian Tube Dysfunction The eustachian tube connects the middle ear to the back of the nose. It regulates air pressure in the middle ear by allowing air to move between the ear and nose. It also helps to drain fluid from the middle ear space. When the eustachian tube does not function properly, air pressure, fluid, or both can build up in the middle ear. Eustachian tube dysfunction can affect one or both ears. What are the causes? This condition happens when the eustachian tube becomes blocked or cannot open normally. This may result from:  Ear infections.  Colds and other upper respiratory infections.  Allergies.  Irritation, such as from cigarette smoke or acid from the stomach coming up into the esophagus (gastroesophageal reflux).  Sudden changes in air pressure, such as from descending in an airplane.  Abnormal growths in the nose or throat, such as nasal polyps, tumors, or enlarged tissue at the back of the throat (adenoids).  What increases the risk? This condition may be more likely to develop in people who smoke and people who are overweight. Eustachian tube dysfunction may also be more likely to develop in children, especially children who have:  Certain birth defects of the mouth, such as cleft palate.  Large tonsils and adenoids.  What are the signs or symptoms? Symptoms of this condition may include:  A feeling of fullness in the ear.  Ear pain.  Clicking or popping noises in the ear.  Ringing in the ear.  Hearing loss.  Loss of balance.  Symptoms may get worse when the air pressure around you changes, such as when you travel to an area of high elevation or fly on an airplane. How is this diagnosed? This condition may be diagnosed based on:  Your symptoms.  A physical exam of your ear, nose, and throat.  Tests, such as those that measure: ? The movement of your eardrum (tympanogram). ? Your hearing (audiometry).  How is  this treated? Treatment depends on the cause and severity of your condition. If your symptoms are mild, you may be able to relieve your symptoms by moving air into ("popping") your ears. If you have symptoms of fluid in your ears, treatment may include:  Decongestants.  Antihistamines.  Nasal sprays or ear drops that contain medicines that reduce swelling (steroids).  In some cases, you may need to have a procedure to drain the fluid in your eardrum (myringotomy). In this procedure, a small tube is placed in the eardrum to:  Drain the fluid.  Restore the air in the middle ear space.  Follow these instructions at home:  Take over-the-counter and prescription medicines only as told by your health care provider.  Use techniques to help pop your ears as recommended by your health care provider. These may include: ? Chewing gum. ? Yawning. ? Frequent, forceful swallowing. ? Closing your mouth, holding your nose closed, and gently blowing as if you are trying to blow air out of your nose.  Do not do any of the following until your health care provider approves: ? Travel to high altitudes. ? Fly in airplanes. ? Work in a Pension scheme manager or room. ? Scuba dive.  Keep your ears dry. Dry your ears completely after showering or bathing.  Do not smoke.  Keep all follow-up visits as told by your health care provider. This is important. Contact a health care provider if:  Your symptoms do not go away after treatment.  Your symptoms come back  after treatment.  You are unable to pop your ears.  You have: ? A fever. ? Pain in your ear. ? Pain in your head or neck. ? Fluid draining from your ear.  Your hearing suddenly changes.  You become very dizzy.  You lose your balance. This information is not intended to replace advice given to you by your health care provider. Make sure you discuss any questions you have with your health care provider. Document Released: 11/02/2015  Document Revised: 03/13/2016 Document Reviewed: 10/25/2014 Elsevier Interactive Patient Education  2018 [employer]. cort 1 spray BID, when symptoms improved decrease to once a day 2. Continue with claritin for now, can consider changing to generic zyrtec or allegra 3. Start oral decongestant, phenylephrine, over the counter, 1 tab every 4 hours while awake    IF you received an x-ray today, you will receive an invoice from [facility]. Please contact [facility] at [phone] with questions or concerns regarding your invoice.   IF you received labwork today, you will receive an invoice from [place]. Please contact [facility] at [phone] with questions or concerns regarding your invoice.   Our billing staff will not be able to assist you with questions regarding bills from these companies.  You will be contacted with the lab results as soon as they are available. The fastest way to get your results is to activate your My Chart account. Instructions are located on the last page of this paperwork. If you have not heard from [place] regarding the results in 2 weeks, please contact this office.

## 2017-11-09 NOTE — Progress Notes (Signed)
1/21/201911:23 AM  Mary Wyatt 09-Jun-1986, 32 y.o. female [id number]  Chief Complaint  Patient presents with  . Otalgia    has muffled hearing in left ear for 2 month    HPI:   Patient is a 32 y.o. female who presents today for 2 months of muffled, pressure feeling in left ear. Started with otitis media, has been having issues with sinus. Has been treated with abx twice during this time for sinus infections. She denies any sinus surgery, does not smoke. She denies any sinus pressure, fever or chills, she denies any ear drainage. She does endorse PND. She does a claritin, humidifier and netty pot daily.  Flonase and sudafed have been too drying and caused worsening symptoms.   Depression screen [facility] 2/9 11/09/2017 10/31/2017 10/10/2017  Decreased Interest 0 1 0  Down, Depressed, Hopeless 0 1 1  PHQ - 2 Score 0 2 1  Altered sleeping - 3 0  Tired, decreased energy - 3 1  Change in appetite - 0 0  Feeling bad or failure about yourself  - 0 0  Trouble concentrating - 0 0  Moving slowly or fidgety/restless - 0 0  Suicidal thoughts - 0 0  PHQ-9 Score - 8 2  Difficult doing work/chores - Very difficult Somewhat difficult    Allergies  Allergen Reactions  . Diflucan [Fluconazole] Hives    Prior to Admission medications   Medication Sig Start Date End Date Taking? Authorizing Provider  amoxicillin (AMOXIL) 500 MG tablet Take 1 tablet (500 mg total) by mouth 2 (two) times daily. 11/05/17  Yes Wardell Honour, MD  diltiazem 2 % GEL Apply 1 application topically 3 (three) times daily. 01/01/17  Yes Esterwood, Amy S, PA-C  docusate sodium (COLACE) 100 MG capsule Take 100 mg by mouth 2 (two) times daily as needed for mild constipation.   Yes [provider]  fluticasone (FLONASE) 50 MCG/ACT nasal spray Place 2 sprays daily into both nostrils. 08/26/17  Yes Forrest Moron, MD  hydrOXYzine (VISTARIL) 25 MG capsule Take 1 capsule (25 mg total) by mouth every 8 (eight) hours as  needed for anxiety or nausea. 06/10/17  Yes Eksir, Richard Miu, MD  KAITLIB FE 0.8-25 MG-MCG tablet as directed. 11/03/16  Yes [provider]  predniSONE (DELTASONE) 20 MG tablet Two tablets daily x 5 days then one tablet daily x 5 days Patient not taking: Reported on 11/09/2017 10/10/17   Wardell Honour, MD    Past Medical History:  Diagnosis Date  . Allergy   . Anemia   . Anxiety   . Chest pain   . Depression   . Genital warts   . GERD (gastroesophageal reflux disease)   . Interstitial cystitis   . RMSF [facility] spotted fever) 2015  . Ulcer     Past Surgical History:  Procedure Laterality Date  . OVARIAN CYST REMOVAL    . TONSILLECTOMY      Social History   Tobacco Use  . Smoking status: Former Research scientist (life sciences)  . Smokeless tobacco: Never Used  Substance Use Topics  . Alcohol use: Yes    Alcohol/week: 4.2 oz    Types: 7 Standard drinks or equivalent per week    Comment: Drinks at least 1 drink a day of different things    Family History  Problem Relation Age of Onset  . GI problems Sister   . Asthma Sister   . Anxiety disorder Sister   . Heart disease Maternal Grandmother   .  Anxiety disorder Mother   . Depression Mother   . Alcohol abuse Maternal Grandfather   . Anxiety disorder Sister   . Cancer Maternal Aunt        breast  . OCD Maternal Aunt   . Colon cancer Neg Hx     ROS Per hpi  OBJECTIVE:  Blood pressure 110/78, pulse 85, temperature 98.3 F (36.8 C), temperature source Oral, height 5' 6"  (1.676 m), weight 153 lb 9.6 oz (69.7 kg), last menstrual period 10/31/2017, SpO2 97 %.  Physical Exam  Constitutional: She is oriented to person, place, and time and well-developed, well-nourished, and in no distress.  HENT:  Head: Normocephalic and atraumatic.  Right Ear: Hearing, tympanic membrane, external ear and ear canal normal.  Left Ear: Hearing, tympanic membrane, external ear and ear canal normal.  Nose: Mucosal edema present. No  rhinorrhea. No epistaxis. Right sinus exhibits no maxillary sinus tenderness and no frontal sinus tenderness. Left sinus exhibits no maxillary sinus tenderness and no frontal sinus tenderness.  Mouth/Throat: Oropharynx is clear and moist.  + PND and cobblestonning  Eyes: EOM are normal. Pupils are equal, round, and reactive to light.  Neck: Neck supple.  Cardiovascular: Normal rate, regular rhythm and normal heart sounds. Exam reveals no gallop and no friction rub.  No murmur heard. Pulmonary/Chest: Effort normal and breath sounds normal. She has no wheezes. She has no rales.  Lymphadenopathy:    She has no cervical adenopathy.  Neurological: She is alert and oriented to person, place, and time. Gait normal.  Skin: Skin is warm and dry.     ASSESSMENT and PLAN  1. Eustachian tube dysfunction, left Discussed supportive measures, new meds r/se/b and RTC precautions. Patient educational handout given. - stop flonase, consider changing oral antihistamine, start phenylephrine, cont with humidifier and netty pot - triamcinolone (NASACORT) 55 MCG/ACT AERO nasal inhaler; Place 1 spray into the nose 2 (two) times daily.  Return if symptoms worsen or fail to improve.    Rutherford Guys, MD Primary Care at [facility] [place], [place] [address] Ph.  [phone] Fax [phone]

## 2017-11-12 ENCOUNTER — Encounter: Payer: Self-pay | Admitting: Family Medicine

## 2017-11-12 ENCOUNTER — Ambulatory Visit: Payer: BLUE CROSS/BLUE SHIELD | Admitting: Family Medicine

## 2017-11-12 ENCOUNTER — Other Ambulatory Visit: Payer: Self-pay

## 2017-11-12 VITALS — BP 108/78 | HR 74 | Temp 98.5°F | Ht 65.0 in | Wt 151.8 lb

## 2017-11-12 DIAGNOSIS — R350 Frequency of micturition: Secondary | ICD-10-CM | POA: Diagnosis not present

## 2017-11-12 DIAGNOSIS — H6982 Other specified disorders of Eustachian tube, left ear: Secondary | ICD-10-CM | POA: Diagnosis not present

## 2017-11-12 LAB — POCT WET + KOH PREP
Trich by wet prep: ABSENT
Yeast by KOH: ABSENT
Yeast by wet prep: ABSENT

## 2017-11-12 LAB — POCT URINALYSIS DIP (MANUAL ENTRY)
Bilirubin, UA: NEGATIVE
Glucose, UA: NEGATIVE mg/dL
Ketones, POC UA: NEGATIVE mg/dL
Leukocytes, UA: NEGATIVE
Nitrite, UA: NEGATIVE
Protein Ur, POC: NEGATIVE mg/dL
Spec Grav, UA: 1.01 (ref 1.010–1.025)
Urobilinogen, UA: 0.2 E.U./dL
pH, UA: 7 (ref 5.0–8.0)

## 2017-11-12 LAB — POC MICROSCOPIC URINALYSIS (UMFC): Mucus: ABSENT

## 2017-11-12 NOTE — Progress Notes (Signed)
1/24/20195:17 PM  RAMON BRANT 11/16/1985, 32 y.o. female [id number]  Chief Complaint  Patient presents with  . Urinary Tract Infection    took the meds given for the uti,does not feel like it is working. Polyuria and strong smell to urine. wants to have another culture done  . Ear Pain    HPI:   Patient is a 32 y.o. female who presents today for follow- up  1. Left ear pressure, almost 3 months, started after an ear infection, feels pressure, decreased hearting, at times fluid, at times painful. Not responding with nasal steroids and oral decongestants. Starting to get mild bloody nose from nasacort. Denies any sinus surgery.  2. Patient continues to have urinary symptoms of urgency, frequency and strong odor. Urine culture on 1/12 was positive for GBS, treated with amoxicillin. GC/Ch neg. Patient not feeling better after completion of abx. Would like to be recultured.   Depression screen [facility] 2/9 11/12/2017 11/09/2017 10/31/2017  Decreased Interest 0 0 1  Down, Depressed, Hopeless 0 0 1  PHQ - 2 Score 0 0 2  Altered sleeping - - 3  Tired, decreased energy - - 3  Change in appetite - - 0  Feeling bad or failure about yourself  - - 0  Trouble concentrating - - 0  Moving slowly or fidgety/restless - - 0  Suicidal thoughts - - 0  PHQ-9 Score - - 8  Difficult doing work/chores - - Very difficult    Allergies  Allergen Reactions  . Diflucan [Fluconazole] Hives    Prior to Admission medications   Medication Sig Start Date End Date Taking? Authorizing Provider  diltiazem 2 % GEL Apply 1 application topically 3 (three) times daily. 01/01/17  Yes Esterwood, Amy S, PA-C  docusate sodium (COLACE) 100 MG capsule Take 100 mg by mouth 2 (two) times daily as needed for mild constipation.   Yes [provider]  hydrOXYzine (VISTARIL) 25 MG capsule Take 1 capsule (25 mg total) by mouth every 8 (eight) hours as needed for anxiety or nausea. 06/10/17  Yes Eksir, Richard Miu,  MD  KAITLIB FE 0.8-25 MG-MCG tablet as directed. 11/03/16  Yes [provider]  triamcinolone (NASACORT) 55 MCG/ACT AERO nasal inhaler Place 1 spray into the nose 2 (two) times daily. 11/09/17  Yes Rutherford Guys, MD  amoxicillin (AMOXIL) 500 MG tablet Take 1 tablet (500 mg total) by mouth 2 (two) times daily. Patient not taking: Reported on 11/12/2017 11/05/17   Wardell Honour, MD  predniSONE (DELTASONE) 20 MG tablet Two tablets daily x 5 days then one tablet daily x 5 days Patient not taking: Reported on 11/09/2017 10/10/17   Wardell Honour, MD    Past Medical History:  Diagnosis Date  . Allergy   . Anemia   . Anxiety   . Chest pain   . Depression   . Genital warts   . GERD (gastroesophageal reflux disease)   . Interstitial cystitis   . RMSF [facility] spotted fever) 2015  . Ulcer     Past Surgical History:  Procedure Laterality Date  . OVARIAN CYST REMOVAL    . TONSILLECTOMY      Social History   Tobacco Use  . Smoking status: Former Research scientist (life sciences)  . Smokeless tobacco: Never Used  Substance Use Topics  . Alcohol use: Yes    Alcohol/week: 4.2 oz    Types: 7 Standard drinks or equivalent per week    Comment: Drinks at least 1 drink  a day of different things    Family History  Problem Relation Age of Onset  . GI problems Sister   . Asthma Sister   . Anxiety disorder Sister   . Heart disease Maternal Grandmother   . Anxiety disorder Mother   . Depression Mother   . Alcohol abuse Maternal Grandfather   . Anxiety disorder Sister   . Cancer Maternal Aunt        breast  . OCD Maternal Aunt   . Colon cancer Neg Hx     ROS Per hpi  OBJECTIVE:  Blood pressure 108/78, pulse 74, temperature 98.5 F (36.9 C), temperature source Oral, height 5' 5"  (1.651 m), weight 151 lb 12.8 oz (68.9 kg), last menstrual period 10/31/2017, SpO2 97 %.  Physical Exam  Constitutional: She is oriented to person, place, and time and well-developed, well-nourished, and in no  distress.  HENT:  Head: Normocephalic and atraumatic.  Right Ear: Hearing, tympanic membrane, external ear and ear canal normal.  Left Ear: Hearing, tympanic membrane, external ear and ear canal normal.  Mouth/Throat: Oropharynx is clear and moist.  Eyes: EOM are normal. Pupils are equal, round, and reactive to light.  Neck: Neck supple.  Cardiovascular: Normal rate, regular rhythm and normal heart sounds. Exam reveals no gallop and no friction rub.  No murmur heard. Pulmonary/Chest: Effort normal and breath sounds normal. She has no wheezes. She has no rales.  Lymphadenopathy:    She has no cervical adenopathy.  Neurological: She is alert and oriented to person, place, and time. Gait normal.  Skin: Skin is warm and dry.      Results for orders placed or performed in visit on 11/12/17 (from the past 24 hour(s))  POCT urinalysis dipstick     Status: Abnormal   Collection Time: 11/12/17  5:11 PM  Result Value Ref Range   Color, UA yellow yellow   Clarity, UA clear clear   Glucose, UA negative negative mg/dL   Bilirubin, UA negative negative   Ketones, POC UA negative negative mg/dL   Spec Grav, UA 1.010 1.010 - 1.025   Blood, UA trace-intact (A) negative   pH, UA 7.0 5.0 - 8.0   Protein Ur, POC negative negative mg/dL   Urobilinogen, UA 0.2 0.2 or 1.0 E.U./dL   Nitrite, UA Negative Negative   Leukocytes, UA Negative Negative  POCT Wet + KOH Prep     Status: Abnormal   Collection Time: 11/12/17  5:50 PM  Result Value Ref Range   Yeast by KOH Absent Absent   Yeast by wet prep Absent Absent   WBC by wet prep None (A) Few   Clue Cells Wet Prep HPF POC None None   Trich by wet prep Absent Absent   Bacteria Wet Prep HPF POC Many (A) Few   Epithelial Cells By [employer] ([facility]) Many (A) None, Few, Too numerous to count   RBC,UR,HPF,POC None None RBC/hpf  POCT Microscopic Urinalysis ([facility])     Status: Abnormal   Collection Time: 11/12/17  5:53 PM  Result Value Ref Range    WBC,UR,HPF,POC None None WBC/hpf   RBC,UR,HPF,POC None None RBC/hpf   Bacteria Many (A) None, Too numerous to count   Mucus Absent Absent   Epithelial Cells, UR Per Microscopy Many (A) None, Too numerous to count cells/hpf    ASSESSMENT and PLAN  1. Urinary frequency Resending for culture. Wet prep negative today. Discussed trial of pyridium for 2-3 days and avoidance of bladder irritating foods/liquids.  Patient has pyridium at home, no rx needed.  - POCT urinalysis dipstick - Urine Culture - POCT Wet + KOH Prep - POCT Microscopic Urinalysis ([facility])  2. Eustachian tube dysfunction, left Patient not responding to conservative treatment, has decreased hearing, further eval and mgt needed. - Ambulatory referral to ENT  Return in about 6 weeks (around 12/24/2017) for after ENT.    Rutherford Guys, MD Primary Care at [place], [place] [address] Ph.  [phone] Fax [phone]

## 2017-11-12 NOTE — Patient Instructions (Signed)
     IF you received an x-ray today, you will receive an invoice from [facility]. Please contact [facility] at [phone] with questions or concerns regarding your invoice.   IF you received labwork today, you will receive an invoice from [facility]. Please contact [facility] at [phone] with questions or concerns regarding your invoice.   Our billing staff will not be able to assist you with questions regarding bills from these companies.  You will be contacted with the lab results as soon as they are available. The fastest way to get your results is to activate your My Chart account. Instructions are located on the last page of this paperwork. If you have not heard from us regarding the results in 2 weeks, please contact this office.     

## 2017-11-14 LAB — URINE CULTURE

## 2017-11-16 ENCOUNTER — Ambulatory Visit (INDEPENDENT_AMBULATORY_CARE_PROVIDER_SITE_OTHER): Payer: BLUE CROSS/BLUE SHIELD | Admitting: Clinical

## 2017-11-16 DIAGNOSIS — F411 Generalized anxiety disorder: Secondary | ICD-10-CM

## 2017-11-17 ENCOUNTER — Encounter: Payer: Self-pay | Admitting: Family Medicine

## 2017-11-17 ENCOUNTER — Other Ambulatory Visit: Payer: Self-pay | Admitting: Family Medicine

## 2017-11-17 MED ORDER — NITROFURANTOIN MONOHYD MACRO 100 MG PO CAPS: 100.0000 mg | ORAL_CAPSULE | Freq: Two times a day (BID) | ORAL | 0 refills | Status: DC

## 2017-11-18 DIAGNOSIS — J322 Chronic ethmoidal sinusitis: Secondary | ICD-10-CM | POA: Diagnosis not present

## 2017-11-18 DIAGNOSIS — J320 Chronic maxillary sinusitis: Secondary | ICD-10-CM | POA: Diagnosis not present

## 2017-11-18 DIAGNOSIS — J040 Acute laryngitis: Secondary | ICD-10-CM | POA: Diagnosis not present

## 2017-11-18 DIAGNOSIS — H6522 Chronic serous otitis media, left ear: Secondary | ICD-10-CM | POA: Diagnosis not present

## 2017-11-23 ENCOUNTER — Ambulatory Visit: Payer: BLUE CROSS/BLUE SHIELD | Admitting: Family Medicine

## 2017-11-23 ENCOUNTER — Other Ambulatory Visit: Payer: Self-pay

## 2017-11-23 ENCOUNTER — Encounter: Payer: Self-pay | Admitting: Family Medicine

## 2017-11-23 VITALS — BP 108/76 | HR 88 | Temp 98.9°F | Resp 16 | Ht 66.0 in | Wt 149.8 lb

## 2017-11-23 DIAGNOSIS — N3010 Interstitial cystitis (chronic) without hematuria: Secondary | ICD-10-CM

## 2017-11-23 DIAGNOSIS — N809 Endometriosis, unspecified: Secondary | ICD-10-CM | POA: Diagnosis not present

## 2017-11-23 DIAGNOSIS — R350 Frequency of micturition: Secondary | ICD-10-CM | POA: Diagnosis not present

## 2017-11-23 DIAGNOSIS — R102 Pelvic and perineal pain: Secondary | ICD-10-CM | POA: Diagnosis not present

## 2017-11-23 LAB — POCT URINALYSIS DIP (MANUAL ENTRY)
Bilirubin, UA: NEGATIVE
Glucose, UA: NEGATIVE mg/dL
Leukocytes, UA: NEGATIVE
Nitrite, UA: NEGATIVE
Protein Ur, POC: NEGATIVE mg/dL
Spec Grav, UA: 1.01 (ref 1.010–1.025)
Urobilinogen, UA: 0.2 E.U./dL
pH, UA: 6 (ref 5.0–8.0)

## 2017-11-23 LAB — POC MICROSCOPIC URINALYSIS (UMFC): Mucus: ABSENT

## 2017-11-23 MED ORDER — PHENAZOPYRIDINE HCL 100 MG PO TABS
100.0000 mg | ORAL_TABLET | Freq: Three times a day (TID) | ORAL | 0 refills | Status: DC | PRN
Start: 2017-11-23 — End: 2017-12-23

## 2017-11-23 NOTE — Progress Notes (Signed)
2/4/20195:16 PM  Mary Wyatt 03-23-1986, 32 y.o. female [id number]  Chief Complaint  Patient presents with  . UTI    still taking Macrobid,   . urinary frequency    urethral pain and belly pain "worsened last night  . medication    per pt "finished" taking Azithromycin 500 mg from 1/30-11/20/17 for URI     HPI:   Patient is a 32 y.o. female who presents today for continued urinary frequency, dysuria and pelvic pain.  Patient reports ongoing symptoms since Dec 2018, Urine cx + GBS treated with amoxicillin. Symptoms did not resolve, repeat urine culture a month later low CFU of e coli, treated with nitrofurantoin. Has not completed course yet, symptoms worsened last night.   Patient reports h/o endometriosis diagnosed by laporascopy, had been stable on OCPs, but for past 2 months, having periods about every 3 weeks, heavy, painful. LMP 10/31/17.  She reports having very similar symptoms about 10 years ago, saw specialist, no testing done, diagnosed with interstitial cystitis. Symptoms self resolved until of recent. Has stopped all caffeine, sexual intercourse, baths.   Denies constipation, reports normal comfortable daily BM with stool softner BID  Depression screen [facility] 2/9 11/23/2017 11/12/2017 11/09/2017  Decreased Interest 0 0 0  Down, Depressed, Hopeless 0 0 0  PHQ - 2 Score 0 0 0  Altered sleeping - - -  Tired, decreased energy - - -  Change in appetite - - -  Feeling bad or failure about yourself  - - -  Trouble concentrating - - -  Moving slowly or fidgety/restless - - -  Suicidal thoughts - - -  PHQ-9 Score - - -  Difficult doing work/chores - - -    Allergies  Allergen Reactions  . Diflucan [Fluconazole] Hives    Prior to Admission medications   Medication Sig Start Date End Date Taking? Authorizing Provider  diltiazem 2 % GEL Apply 1 application topically 3 (three) times daily. 01/01/17  Yes Esterwood, Amy S, PA-C  docusate sodium (COLACE) 100 MG capsule  Take 100 mg by mouth 2 (two) times daily as needed for mild constipation.   Yes [provider]  hydrOXYzine (VISTARIL) 25 MG capsule Take 1 capsule (25 mg total) by mouth every 8 (eight) hours as needed for anxiety or nausea. 06/10/17  Yes Eksir, Richard Miu, MD  KAITLIB FE 0.8-25 MG-MCG tablet as directed. 11/03/16  Yes [provider]  nitrofurantoin, macrocrystal-monohydrate, (MACROBID) 100 MG capsule Take 1 capsule (100 mg total) by mouth 2 (two) times daily. 11/17/17  Yes Rutherford Guys, MD  triamcinolone (NASACORT) 55 MCG/ACT AERO nasal inhaler Place 1 spray into the nose 2 (two) times daily. 11/09/17  Yes Rutherford Guys, MD    Past Medical History:  Diagnosis Date  . Allergy   . Anemia   . Anxiety   . Chest pain   . Depression   . Genital warts   . GERD (gastroesophageal reflux disease)   . Interstitial cystitis   . RMSF [facility] spotted fever) 2015  . Ulcer     Past Surgical History:  Procedure Laterality Date  . OVARIAN CYST REMOVAL    . TONSILLECTOMY      Social History   Tobacco Use  . Smoking status: Former Research scientist (life sciences)  . Smokeless tobacco: Never Used  Substance Use Topics  . Alcohol use: Yes    Alcohol/week: 4.2 oz    Types: 7 Standard drinks or equivalent per week  Comment: Drinks at least 1 drink a day of different things    Family History  Problem Relation Age of Onset  . GI problems Sister   . Asthma Sister   . Anxiety disorder Sister   . Heart disease Maternal Grandmother   . Anxiety disorder Mother   . Depression Mother   . Alcohol abuse Maternal Grandfather   . Anxiety disorder Sister   . Cancer Maternal Aunt        breast  . OCD Maternal Aunt   . Colon cancer Neg Hx     Review of Systems  Constitutional: Negative for chills, fever, malaise/fatigue and weight loss.  Gastrointestinal: Positive for abdominal pain. Negative for constipation, diarrhea, nausea and vomiting.  Genitourinary: Positive for dysuria,  frequency and urgency. Negative for flank pain and hematuria.     OBJECTIVE:  Blood pressure 108/76, pulse 88, temperature 98.9 F (37.2 C), temperature source Oral, resp. rate 16, height 5' 6"  (1.676 m), weight 149 lb 12.8 oz (67.9 kg), last menstrual period 10/31/2017, SpO2 95 %.  Physical Exam  Constitutional: She is well-developed, well-nourished, and in no distress.  Abdominal: Soft. Bowel sounds are normal. She exhibits no distension. There is tenderness (LLQ ). There is no rebound and no guarding.  Genitourinary: Uterus is not fixed and not tender.  Cervix is not fixed. Cervix exhibits no motion tenderness and no lesion. Right adnexum displays no mass and no tenderness. Left adnexum displays tenderness. Left adnexum displays no mass. Vulva exhibits no erythema, no lesion and no rash. Vagina exhibits normal mucosa and no lesion. Bloody and vaginal discharge found.    Results for orders placed or performed in visit on 11/23/17 (from the past 24 hour(s))  POCT urinalysis dipstick     Status: Abnormal   Collection Time: 11/23/17  5:21 PM  Result Value Ref Range   Color, UA yellow yellow   Clarity, UA clear clear   Glucose, UA negative negative mg/dL   Bilirubin, UA negative negative   Ketones, POC UA trace (5) (A) negative mg/dL   Spec Grav, UA 1.010 1.010 - 1.025   Blood, UA trace-intact (A) negative   pH, UA 6.0 5.0 - 8.0   Protein Ur, POC negative negative mg/dL   Urobilinogen, UA 0.2 0.2 or 1.0 E.U./dL   Nitrite, UA Negative Negative   Leukocytes, UA Negative Negative  POCT Microscopic Urinalysis ([facility])     Status: Abnormal   Collection Time: 11/23/17  5:38 PM  Result Value Ref Range   WBC,UR,HPF,POC None None WBC/hpf   RBC,UR,HPF,POC None None RBC/hpf   Bacteria Few (A) None, Too numerous to count   Mucus Absent Absent   Epithelial Cells, UR Per Microscopy Moderate (A) None, Too numerous to count cells/hpf      ASSESSMENT and PLAN  1. Interstitial  cystitis Ongoing symptoms for 2 months that have not resolved with abx treatment in setting of what seems to be worsening of endometriosis, which I believe is trigger her interstitial cystitis. Pyridium prn for comfort, discussed risk of medication, cont with avoidance of irritants, importance of managing constipation, referring back to gyn for management of endometriosis.  - Ambulatory referral to Gynecology  2. Urinary frequency - POCT urinalysis dipstick - POCT Microscopic Urinalysis ([facility]) - Urine Culture  3. Pelvic pain in female - [place], YEAST, CLUE - GC/Chlamydia Probe Amp([facility]) - Ambulatory referral to Gynecology  4. Endometriosis - Ambulatory referral to Gynecology  Other orders - phenazopyridine (PYRIDIUM) 100 MG  tablet; Take 1 tablet (100 mg total) by mouth 3 (three) times daily as needed for pain.  Return if symptoms worsen or fail to improve.    Rutherford Guys, MD Primary Care at [place], [place] [address] Ph.  [phone] Fax [phone]

## 2017-11-23 NOTE — Patient Instructions (Addendum)
IF you received an x-ray today, you will receive an invoice from [facility]. Please contact [facility] at [phone] with questions or concerns regarding your invoice.   IF you received labwork today, you will receive an invoice from [facility]. Please contact [facility] at [phone] with questions or concerns regarding your invoice.   Our billing staff will not be able to assist you with questions regarding bills from these companies.  You will be contacted with the lab results as soon as they are available. The fastest way to get your results is to activate your My Chart account. Instructions are located on the last page of this paperwork. If you have not heard from [place] regarding the results in 2 weeks, please contact this office.     Interstitial Cystitis Interstitial cystitis is a condition that causes inflammation of the bladder. The bladder is a hollow organ in the lower part of your abdomen. It stores urine after the urine is made by your kidneys. With interstitial cystitis, you may have pain in the bladder area. You may also have a frequent and urgent need to urinate. The severity of interstitial cystitis can vary from person to person. You may have flare-ups of the condition, and then it may go away for a while. For many people who have this condition, it becomes a long-term problem. What are the causes? The cause of this condition is not known. What increases the risk? This condition is more likely to develop in women. What are the signs or symptoms? Symptoms of interstitial cystitis vary, and they can change over time. Symptoms may include:  Discomfort or pain in the bladder area. This can range from mild to severe. The pain may change in intensity as the bladder fills with urine or as it empties.  Pelvic pain.  An urgent need to urinate.  Frequent urination.  Pain during sexual intercourse.  Pinpoint bleeding on the bladder wall.  For women, the  symptoms often get worse during menstruation. How is this diagnosed? This condition is diagnosed by evaluating your symptoms and ruling out other causes. A physical exam will be done. Various tests may be done to rule out other conditions. Common tests include:  Urine tests.  Cystoscopy. In this test, a tool that is like a very thin telescope is used to look into your bladder.  Biopsy. This involves taking a sample of tissue from the bladder wall to be examined under a microscope.  How is this treated? There is no cure for interstitial cystitis, but treatment methods are available to control your symptoms. Work closely with your health care provider to find the treatments that will be most effective for you. Treatment options may include:  Medicines to relieve pain and to help reduce the number of times that you feel the need to urinate.  Bladder training. This involves learning ways to control when you urinate, such as: ? Urinating at scheduled times. ? Training yourself to delay urination. ? Doing exercises (Kegel exercises) to strengthen the muscles that control urine flow.  Lifestyle changes, such as changing your diet or taking steps to control stress.  Use of a device that provides electrical stimulation in order to reduce pain.  A procedure that stretches your bladder by filling it with air or fluid.  Surgery. This is rare. It is only done for extreme cases if other treatments do not help.  Follow these instructions at home:  Take medicines only as directed by your health care  provider.  Use bladder training techniques as directed. ? Keep a bladder diary to find out which foods, liquids, or activities make your symptoms worse. ? Use your bladder diary to schedule bathroom trips. If you are away from home, plan to be near a bathroom at each of your scheduled times. ? Make sure you urinate just before you leave the house and just before you go to bed.  Do Kegel exercises as  directed by your health care provider.  Do not drink alcohol.  Do not use any tobacco products, including cigarettes, chewing tobacco, or electronic cigarettes. If you need help quitting, ask your health care provider.  Make dietary changes as directed by your health care provider. You may need to avoid spicy foods and foods that contain a high amount of potassium.  Limit your drinking of beverages that stimulate urination. These include soda, coffee, and tea.  Keep all follow-up visits as directed by your health care provider. This is important. Contact a health care provider if:  Your symptoms do not get better after treatment.  Your pain and discomfort are getting worse.  You have more frequent urges to urinate.  You have a fever. Get help right away if:  You are not able to control your bladder at all. This information is not intended to replace advice given to you by your health care provider. Make sure you discuss any questions you have with your health care provider. Document Released: 06/06/2004 Document Revised: 03/13/2016 Document Reviewed: 06/13/2014 Elsevier Interactive Patient Education  [employer].

## 2017-11-24 LAB — WET PREP FOR TRICH, YEAST, CLUE
Clue Cell Exam: NEGATIVE
Trichomonas Exam: NEGATIVE
Yeast Exam: NEGATIVE

## 2017-11-25 LAB — URINE CULTURE: Organism ID, Bacteria: NO GROWTH

## 2017-11-25 LAB — GC/CHLAMYDIA PROBE AMP
Chlamydia trachomatis, NAA: NEGATIVE
Neisseria gonorrhoeae by PCR: NEGATIVE

## 2017-11-27 ENCOUNTER — Encounter: Payer: Self-pay | Admitting: Family Medicine

## 2017-11-30 ENCOUNTER — Ambulatory Visit (INDEPENDENT_AMBULATORY_CARE_PROVIDER_SITE_OTHER): Payer: BLUE CROSS/BLUE SHIELD | Admitting: Clinical

## 2017-11-30 DIAGNOSIS — F411 Generalized anxiety disorder: Secondary | ICD-10-CM

## 2017-12-01 DIAGNOSIS — J322 Chronic ethmoidal sinusitis: Secondary | ICD-10-CM | POA: Diagnosis not present

## 2017-12-01 DIAGNOSIS — J3081 Allergic rhinitis due to animal (cat) (dog) hair and dander: Secondary | ICD-10-CM | POA: Diagnosis not present

## 2017-12-01 DIAGNOSIS — J320 Chronic maxillary sinusitis: Secondary | ICD-10-CM | POA: Diagnosis not present

## 2017-12-03 DIAGNOSIS — N951 Menopausal and female climacteric states: Secondary | ICD-10-CM | POA: Diagnosis not present

## 2017-12-03 DIAGNOSIS — R319 Hematuria, unspecified: Secondary | ICD-10-CM | POA: Diagnosis not present

## 2017-12-03 DIAGNOSIS — R102 Pelvic and perineal pain: Secondary | ICD-10-CM | POA: Diagnosis not present

## 2017-12-03 DIAGNOSIS — N390 Urinary tract infection, site not specified: Secondary | ICD-10-CM | POA: Diagnosis not present

## 2017-12-04 ENCOUNTER — Encounter: Payer: Self-pay | Admitting: Family Medicine

## 2017-12-08 MED ORDER — MONTELUKAST SODIUM 10 MG PO TABS: 10.0000 mg | ORAL_TABLET | Freq: Every day | ORAL | 3 refills | Status: DC

## 2017-12-14 ENCOUNTER — Ambulatory Visit (INDEPENDENT_AMBULATORY_CARE_PROVIDER_SITE_OTHER): Payer: BLUE CROSS/BLUE SHIELD | Admitting: Clinical

## 2017-12-14 DIAGNOSIS — F411 Generalized anxiety disorder: Secondary | ICD-10-CM

## 2017-12-22 DIAGNOSIS — N809 Endometriosis, unspecified: Secondary | ICD-10-CM | POA: Diagnosis not present

## 2017-12-22 DIAGNOSIS — N946 Dysmenorrhea, unspecified: Secondary | ICD-10-CM | POA: Diagnosis not present

## 2017-12-22 DIAGNOSIS — N924 Excessive bleeding in the premenopausal period: Secondary | ICD-10-CM | POA: Diagnosis not present

## 2017-12-22 DIAGNOSIS — R102 Pelvic and perineal pain: Secondary | ICD-10-CM | POA: Diagnosis not present

## 2017-12-23 ENCOUNTER — Encounter: Payer: Self-pay | Admitting: Family Medicine

## 2017-12-23 ENCOUNTER — Ambulatory Visit: Payer: BLUE CROSS/BLUE SHIELD | Admitting: Family Medicine

## 2017-12-23 ENCOUNTER — Other Ambulatory Visit: Payer: Self-pay

## 2017-12-23 VITALS — BP 113/73 | HR 96 | Temp 98.3°F | Resp 16 | Ht 65.0 in | Wt 148.0 lb

## 2017-12-23 DIAGNOSIS — H6982 Other specified disorders of Eustachian tube, left ear: Secondary | ICD-10-CM | POA: Diagnosis not present

## 2017-12-23 DIAGNOSIS — R0602 Shortness of breath: Secondary | ICD-10-CM | POA: Diagnosis not present

## 2017-12-23 DIAGNOSIS — E538 Deficiency of other specified B group vitamins: Secondary | ICD-10-CM | POA: Diagnosis not present

## 2017-12-23 DIAGNOSIS — F411 Generalized anxiety disorder: Secondary | ICD-10-CM | POA: Diagnosis not present

## 2017-12-23 MED ORDER — PREDNISONE 20 MG PO TABS: ORAL_TABLET | ORAL | 0 refills | Status: DC

## 2017-12-23 NOTE — Progress Notes (Signed)
Subjective:    Patient ID: Mary Wyatt, female    DOB: April 02, 1986, 32 y.o.   MRN: [id number]  12/23/2017  Ear Pain (pt states her left ear has been giving her trouble for months. Pt states there is pain and pressure with some fluid. )    HPI This 32 y.o. female presents for evaluation of ear pain for months LEFT. Pain and pressure.  Onset in November.  List of medications:  Abx, flonase and prednisone, [place] and switched to Zyrtec, Flonase to [employer]. Tried two different decongestants. Switched to [employer].  ENT twice.  First time abx; second time did nothing.  Discouraged cat.  Better in a few weeks. Every day saline rinse bid; saline mist spray in between; allegra and singulair which has always worked; stopped nasal sprays because way too bloody.  Heat is dusty and hot.  Cannot hear out of ear; feels pressure; cannot take it; cannot hear well.  Using humidifier.  Best results were after initial treatment.  Thought due to weather.  In past when allergies were the worst, added singulair and allegra; no improvement. Did see an allergist years ago; diagnosed with non-allergic rhinitis.  Knows has triggers.  Tried Astelin.  Exposure to mold was severe.  No mold in bedroom.    Some SOB which is weird.  Having some tinlging in LEFT hand and B feet.  Waking up at night so bored.  Gets 4-5 hours of sleep; exhausted at bedtime but wakes up wired.  Breatning feels weird; not breathing deep. Will move around and starts breathing normally.   Admits super anxious.  Not taking decongestant daily.  When took decongestant daily, nasal passages were super dry. Getting winded 3 times daily; might be holding breath.  Tingling in feet.  L hand.  Leans on L hand.    No exercise.  Blaming the ear.   BP Readings from Last 3 Encounters:  12/23/17 113/73  11/23/17 108/76  11/12/17 108/78   Wt Readings from Last 3 Encounters:  12/23/17 148 lb (67.1 kg)  11/23/17 149 lb 12.8 oz (67.9 kg)    11/12/17 151 lb 12.8 oz (68.9 kg)   Immunization History  Administered Date(s) Administered  . Hepatitis A, Adult 05/31/2014, 11/30/2014  . Hepatitis B, adult 05/31/2014, 06/28/2014, 11/30/2014  . Influenza,inj,Quad PF,6+ Mos 07/21/2013, 06/28/2014  . Influenza-Unspecified 07/11/2015, 08/24/2016, 07/20/2017  . Tdap 03/03/2012    Review of Systems  Constitutional: Negative for chills, diaphoresis, fatigue and fever.  HENT: Positive for congestion, ear pain, postnasal drip, rhinorrhea, sinus pressure and sinus pain. Negative for ear discharge, sore throat and trouble swallowing.   Respiratory: Positive for shortness of breath. Negative for cough.   Cardiovascular: Negative for chest pain, palpitations and leg swelling.  Gastrointestinal: Negative for abdominal pain, constipation, diarrhea, nausea and vomiting.  Neurological: Positive for numbness.  Psychiatric/Behavioral: The patient is nervous/anxious.     Past Medical History:  Diagnosis Date  . Allergy   . Anemia   . Anxiety   . Chest pain   . Depression   . Genital warts   . GERD (gastroesophageal reflux disease)   . Interstitial cystitis   . RMSF [facility] spotted fever) 2015  . Ulcer    Past Surgical History:  Procedure Laterality Date  . OVARIAN CYST REMOVAL    . TONSILLECTOMY     Allergies  Allergen Reactions  . Diflucan [Fluconazole] Hives   Current Outpatient Medications on File Prior to Visit  Medication Sig  Dispense Refill  . docusate sodium (COLACE) 100 MG capsule Take 100 mg by mouth 2 (two) times daily as needed for mild constipation.    . hydrOXYzine (VISTARIL) 25 MG capsule Take 1 capsule (25 mg total) by mouth every 8 (eight) hours as needed for anxiety or nausea. 120 capsule 1  . KAITLIB FE 0.8-25 MG-MCG tablet as directed.  2  . montelukast (SINGULAIR) 10 MG tablet Take 1 tablet (10 mg total) by mouth at bedtime. 30 tablet 3   No current facility-administered medications on file prior to  visit.    Social History   Socioeconomic History  . Marital status: Single    Spouse name: Not on file  . Number of children: 0  . Years of education: Not on file  . Highest education level: Not on file  Social Needs  . Financial resource strain: Not on file  . Food insecurity - worry: Not on file  . Food insecurity - inability: Not on file  . Transportation needs - medical: Not on file  . Transportation needs - non-medical: Not on file  Occupational History  . Occupation: [employer]  Tobacco Use  . Smoking status: Former Research scientist (life sciences)  . Smokeless tobacco: Never Used  Substance and Sexual Activity  . Alcohol use: Yes    Alcohol/week: 4.2 oz    Types: 7 Standard drinks or equivalent per week    Comment: Drinks at least 1 drink a day of different things  . Drug use: No  . Sexual activity: Yes    Birth control/protection: Pill  Other Topics Concern  . Not on file  Social History Narrative      Marital status:  Single; parents in [place]: none      Employment: [place] [employer] works indoors       Tobacco: none      Alcohol: socially; twice weekly      Drugs: none      Exercise: sporadic; walking sometimes         Daily caffeine    Family History  Problem Relation Age of Onset  . GI problems Sister   . Asthma Sister   . Anxiety disorder Sister   . Heart disease Maternal Grandmother   . Anxiety disorder Mother   . Depression Mother   . Alcohol abuse Maternal Grandfather   . Anxiety disorder Sister   . Cancer Maternal Aunt        breast  . OCD Maternal Aunt   . Colon cancer Neg Hx        Objective:    BP 113/73   Pulse 96   Temp 98.3 F (36.8 C) (Oral)   Resp 16   Ht 5' 5"  (1.651 m)   Wt 148 lb (67.1 kg)   SpO2 95%   BMI 24.63 kg/m  Physical Exam  Constitutional: She is oriented to person, place, and time. She appears well-developed and well-nourished. No distress.  HENT:  Head: Normocephalic and atraumatic.  Right Ear: Tympanic membrane, external  ear and ear canal normal.  Left Ear: Tympanic membrane, external ear and ear canal normal.  Nose: No mucosal edema or rhinorrhea. Right sinus exhibits no maxillary sinus tenderness and no frontal sinus tenderness. Left sinus exhibits maxillary sinus tenderness. Left sinus exhibits no frontal sinus tenderness.  Mouth/Throat: Uvula is midline, oropharynx is clear and moist and mucous membranes are normal.  Eyes: Pupils are equal, round, and reactive to light. Conjunctivae and EOM are normal.  Neck: Normal range of motion. Neck supple. Carotid bruit is not present. No thyromegaly present.  Cardiovascular: Normal rate, regular rhythm, normal heart sounds and intact distal pulses. Exam reveals no gallop and no friction rub.  No murmur heard. Pulmonary/Chest: Effort normal and breath sounds normal. She has no wheezes. She has no rales.  Abdominal: Soft. Bowel sounds are normal. She exhibits no distension and no mass. There is no tenderness. There is no rebound and no guarding.  Lymphadenopathy:    She has no cervical adenopathy.  Neurological: She is alert and oriented to person, place, and time. No cranial nerve deficit. She exhibits normal muscle tone. Coordination normal.  Skin: Skin is warm and dry. No rash noted. She is not diaphoretic. No erythema. No pallor.  Psychiatric: She has a normal mood and affect. Her behavior is normal. Judgment and thought content normal.  Nursing note and vitals reviewed.  No results found. Depression screen [facility] 2/9 12/23/2017 11/23/2017 11/12/2017 11/09/2017 10/31/2017  Decreased Interest 0 0 0 0 1  Down, Depressed, Hopeless 0 0 0 0 1  PHQ - 2 Score 0 0 0 0 2  Altered sleeping - - - - 3  Tired, decreased energy - - - - 3  Change in appetite - - - - 0  Feeling bad or failure about yourself  - - - - 0  Trouble concentrating - - - - 0  Moving slowly or fidgety/restless - - - - 0  Suicidal thoughts - - - - 0  PHQ-9 Score - - - - 8  Difficult doing work/chores - - - -  Very difficult   Fall Risk  12/23/2017 11/23/2017 11/12/2017 11/09/2017 10/31/2017  Falls in the past year? No No No No No        Assessment & Plan:   1. Dysfunction of left eustachian tube   2. Vitamin B12 deficiency   3. Shortness of breath   4. Anxiety state     L ear Eustachian tube dysfunction: persistent; rx for Prednisone provided. Vitamin B12 deficiency: repeat labs today. SOB: New onset; obtain labs to rule out secondary causes; consider CXR if persistent; consistent with anxiety. Anxiety: worsening; highly recommend daily exercise for anxiety management; continue psychotherapy; avoid stimulants including decongestants and caffeine.  Orders Placed This Encounter  Procedures  . CBC with Differential/Platelet  . Comprehensive metabolic panel  . Vitamin B12  . VITAMIN D 25 Hydroxy (Vit-D Deficiency, Fractures)  . TSH   Meds ordered this encounter  Medications  . predniSONE (DELTASONE) 20 MG tablet    Sig: Take 3 PO QAM x 1 day, 2 PO QAM x 5 days, 1 PO QAM x 5 days    Dispense:  18 tablet    Refill:  0    Return in about 2 weeks (around 01/06/2018) for recheck.   Kristi Elayne Guerin, M.D. Primary Care at [facility] previously [facility] [place] [address] [place], [place]  [address] [phone] phone [phone] fax

## 2017-12-23 NOTE — Patient Instructions (Addendum)
IF you received an x-ray today, you will receive an invoice from [facility]. Please contact [facility] at [phone] with questions or concerns regarding your invoice.   IF you received labwork today, you will receive an invoice from [place]. Please contact [facility] at [phone] with questions or concerns regarding your invoice.   Our billing staff will not be able to assist you with questions regarding bills from these companies.  You will be contacted with the lab results as soon as they are available. The fastest way to get your results is to activate your My Chart account. Instructions are located on the last page of this paperwork. If you have not heard from [place] regarding the results in 2 weeks, please contact this office.      Eustachian Tube Dysfunction The eustachian tube connects the middle ear to the back of the nose. It regulates air pressure in the middle ear by allowing air to move between the ear and nose. It also helps to drain fluid from the middle ear space. When the eustachian tube does not function properly, air pressure, fluid, or both can build up in the middle ear. Eustachian tube dysfunction can affect one or both ears. What are the causes? This condition happens when the eustachian tube becomes blocked or cannot open normally. This may result from:  Ear infections.  Colds and other upper respiratory infections.  Allergies.  Irritation, such as from cigarette smoke or acid from the stomach coming up into the esophagus (gastroesophageal reflux).  Sudden changes in air pressure, such as from descending in an airplane.  Abnormal growths in the nose or throat, such as nasal polyps, tumors, or enlarged tissue at the back of the throat (adenoids).  What increases the risk? This condition may be more likely to develop in people who smoke and people who are overweight. Eustachian tube dysfunction may also be more likely to develop in  children, especially children who have:  Certain birth defects of the mouth, such as cleft palate.  Large tonsils and adenoids.  What are the signs or symptoms? Symptoms of this condition may include:  A feeling of fullness in the ear.  Ear pain.  Clicking or popping noises in the ear.  Ringing in the ear.  Hearing loss.  Loss of balance.  Symptoms may get worse when the air pressure around you changes, such as when you travel to an area of high elevation or fly on an airplane. How is this diagnosed? This condition may be diagnosed based on:  Your symptoms.  A physical exam of your ear, nose, and throat.  Tests, such as those that measure: ? The movement of your eardrum (tympanogram). ? Your hearing (audiometry).  How is this treated? Treatment depends on the cause and severity of your condition. If your symptoms are mild, you may be able to relieve your symptoms by moving air into ("popping") your ears. If you have symptoms of fluid in your ears, treatment may include:  Decongestants.  Antihistamines.  Nasal sprays or ear drops that contain medicines that reduce swelling (steroids).  In some cases, you may need to have a procedure to drain the fluid in your eardrum (myringotomy). In this procedure, a small tube is placed in the eardrum to:  Drain the fluid.  Restore the air in the middle ear space.  Follow these instructions at home:  Take over-the-counter and prescription medicines only as told by your health care provider.  Use techniques to help  pop your ears as recommended by your health care provider. These may include: ? Chewing gum. ? Yawning. ? Frequent, forceful swallowing. ? Closing your mouth, holding your nose closed, and gently blowing as if you are trying to blow air out of your nose.  Do not do any of the following until your health care provider approves: ? Travel to high altitudes. ? Fly in airplanes. ? Work in a Pension scheme manager or  room. ? Scuba dive.  Keep your ears dry. Dry your ears completely after showering or bathing.  Do not smoke.  Keep all follow-up visits as told by your health care provider. This is important. Contact a health care provider if:  Your symptoms do not go away after treatment.  Your symptoms come back after treatment.  You are unable to pop your ears.  You have: ? A fever. ? Pain in your ear. ? Pain in your head or neck. ? Fluid draining from your ear.  Your hearing suddenly changes.  You become very dizzy.  You lose your balance. This information is not intended to replace advice given to you by your health care provider. Make sure you discuss any questions you have with your health care provider. Document Released: 11/02/2015 Document Revised: 03/13/2016 Document Reviewed: 10/25/2014 Elsevier Interactive Patient Education  [employer].

## 2017-12-24 LAB — COMPREHENSIVE METABOLIC PANEL
ALBUMIN: 4.6 g/dL (ref 3.5–5.5)
ALK PHOS: 46 IU/L (ref 39–117)
ALT: 18 IU/L (ref 0–32)
AST: 14 IU/L (ref 0–40)
Albumin/Globulin Ratio: 1.9 (ref 1.2–2.2)
BILIRUBIN TOTAL: 0.2 mg/dL (ref 0.0–1.2)
BUN / CREAT RATIO: 11 (ref 9–23)
BUN: 7 mg/dL (ref 6–20)
CHLORIDE: 103 mmol/L (ref 96–106)
CO2: 23 mmol/L (ref 20–29)
Calcium: 9.5 mg/dL (ref 8.7–10.2)
Creatinine, Ser: 0.61 mg/dL (ref 0.57–1.00)
GFR calc non Af Amer: 119 mL/min/{1.73_m2} (ref >59)
GFR, EST AFRICAN AMERICAN: 137 mL/min/{1.73_m2} (ref >59)
Globulin, Total: 2.4 g/dL (ref 1.5–4.5)
Glucose: 115 mg/dL — ABNORMAL HIGH (ref 65–99)
POTASSIUM: 4.1 mmol/L (ref 3.5–5.2)
Sodium: 137 mmol/L (ref 134–144)
TOTAL PROTEIN: 7 g/dL (ref 6.0–8.5)

## 2017-12-24 LAB — CBC WITH DIFFERENTIAL/PLATELET
BASOS: 0 %
Basophils Absolute: 0 10*3/uL (ref 0.0–0.2)
EOS (ABSOLUTE): 0.1 10*3/uL (ref 0.0–0.4)
Eos: 2 %
HEMOGLOBIN: 13.3 g/dL (ref 11.1–15.9)
Hematocrit: 39.9 % (ref 34.0–46.6)
IMMATURE GRANS (ABS): 0 10*3/uL (ref 0.0–0.1)
Immature Granulocytes: 0 %
LYMPHS ABS: 1.4 10*3/uL (ref 0.7–3.1)
LYMPHS: 32 %
MCH: 31.6 pg (ref 26.6–33.0)
MCHC: 33.3 g/dL (ref 31.5–35.7)
MCV: 95 fL (ref 79–97)
Monocytes Absolute: 0.3 10*3/uL (ref 0.1–0.9)
Monocytes: 8 %
NEUTROS ABS: 2.6 10*3/uL (ref 1.4–7.0)
Neutrophils: 58 %
PLATELETS: 255 10*3/uL (ref 150–379)
RBC: 4.21 x10E6/uL (ref 3.77–5.28)
RDW: 13 % (ref 12.3–15.4)
WBC: 4.4 10*3/uL (ref 3.4–10.8)

## 2017-12-24 LAB — VITAMIN B12: Vitamin B-12: 516 pg/mL (ref 232–1245)

## 2017-12-24 LAB — VITAMIN D 25 HYDROXY (VIT D DEFICIENCY, FRACTURES): Vit D, 25-Hydroxy: 41.9 ng/mL (ref 30.0–100.0)

## 2017-12-24 LAB — TSH: TSH: 1.26 u[IU]/mL (ref 0.450–4.500)

## 2017-12-29 ENCOUNTER — Ambulatory Visit (INDEPENDENT_AMBULATORY_CARE_PROVIDER_SITE_OTHER): Payer: BLUE CROSS/BLUE SHIELD | Admitting: Clinical

## 2017-12-29 DIAGNOSIS — F411 Generalized anxiety disorder: Secondary | ICD-10-CM

## 2018-01-06 ENCOUNTER — Encounter: Payer: Self-pay | Admitting: Family Medicine

## 2018-01-06 ENCOUNTER — Ambulatory Visit: Payer: BLUE CROSS/BLUE SHIELD | Admitting: Family Medicine

## 2018-01-06 ENCOUNTER — Other Ambulatory Visit: Payer: Self-pay

## 2018-01-06 VITALS — BP 114/72 | HR 73 | Temp 98.5°F | Resp 16 | Ht 66.93 in | Wt 148.2 lb

## 2018-01-06 DIAGNOSIS — J320 Chronic maxillary sinusitis: Secondary | ICD-10-CM | POA: Diagnosis not present

## 2018-01-06 DIAGNOSIS — J301 Allergic rhinitis due to pollen: Secondary | ICD-10-CM | POA: Diagnosis not present

## 2018-01-06 DIAGNOSIS — H6982 Other specified disorders of Eustachian tube, left ear: Secondary | ICD-10-CM

## 2018-01-06 NOTE — Progress Notes (Signed)
Subjective:    Patient ID: Mary Wyatt, female    DOB: 28-Nov-1985, 32 y.o.   MRN: [id number]  01/06/2018  Dysfuntion of Left Eustachian (2 week follow-up )    HPI This 32 y.o. female presents for evaluation of LEFT eustachian tube dysfunction.  S/p prednisone taper; no improvement.  Still having some pain; muffled in L ear.  With prednisone, two good days yet has returned.  Two days ago, noticed bloody sinuses.  No nasal sprays.  Worse than usual.  Truly bloody nasal congestions for two days.  When spits, will be tinged with bloods.  Today blew nose and all yellow.  Feels L eustachian drainage area; tender.  SIngulair and Allegra daily. Would like to switch to Claritin.  No difference with Singulair.  Lots of sinus inflammation; moving back to [place] one year ago; developed immediate sinus problems.  ENT x 2 visits.  Allergist visit in past five years; s/p allergy testing; non-allergic rhinitis.  Cat is somewhat new; October 2018.  Sleeps with patient. Nettie pot bid.    Went to the gym three times in one week since last visit; sleeping better; waking up at 3:00am ready to go.  Cat is really annoying.    BP Readings from Last 3 Encounters:  02/20/18 120/86  02/17/18 110/80  02/09/18 102/68   Wt Readings from Last 3 Encounters:  02/20/18 148 lb 9.6 oz (67.4 kg)  02/17/18 147 lb (66.7 kg)  02/09/18 147 lb (66.7 kg)   Immunization History  Administered Date(s) Administered  . Hepatitis A, Adult 05/31/2014, 11/30/2014  . Hepatitis B, adult 05/31/2014, 06/28/2014, 11/30/2014  . Influenza,inj,Quad PF,6+ Mos 07/21/2013, 06/28/2014  . Influenza-Unspecified 07/11/2015, 08/24/2016, 07/20/2017  . Tdap 03/03/2012, 02/02/2018    Review of Systems  Constitutional: Negative for chills, diaphoresis, fatigue and fever.  HENT: Positive for congestion and sinus pain. Negative for dental problem, drooling, ear discharge, ear pain, postnasal drip, rhinorrhea, sinus pressure, sneezing,  sore throat, trouble swallowing and voice change.   Respiratory: Negative for cough and shortness of breath.   Cardiovascular: Negative for chest pain, palpitations and leg swelling.  Gastrointestinal: Negative for abdominal pain, constipation, diarrhea, nausea and vomiting.    Past Medical History:  Diagnosis Date  . Allergy   . Anemia   . Anxiety   . Chest pain   . Depression   . Genital warts   . GERD (gastroesophageal reflux disease)   . Interstitial cystitis   . RMSF [facility] spotted fever) 2015  . Ulcer    Past Surgical History:  Procedure Laterality Date  . OVARIAN CYST REMOVAL    . TONSILLECTOMY     Allergies  Allergen Reactions  . Diflucan [Fluconazole] Hives   Current Outpatient Medications on File Prior to Visit  Medication Sig Dispense Refill  . docusate sodium (COLACE) 100 MG capsule Take 100 mg by mouth 2 (two) times daily as needed for mild constipation.    . hydrOXYzine (VISTARIL) 25 MG capsule Take 1 capsule (25 mg total) by mouth every 8 (eight) hours as needed for anxiety or nausea. 120 capsule 1  . Norethin Ace-Eth Estrad-FE 1-20 MG-MCG(24) CHEW TAKE 1 TABLET BY MOUTH EVERY DAY CONTINUOUSLY (SKIPPING PLACEBOS)  12   No current facility-administered medications on file prior to visit.    Social History   Socioeconomic History  . Marital status: Single    Spouse name: Not on file  . Number of children: 0  . Years of education: Not on file  .  Highest education level: Not on file  Occupational History  . Occupation: [employer]  Social Needs  . Financial resource strain: Not on file  . Food insecurity:    Worry: Not on file    Inability: Not on file  . Transportation needs:    Medical: Not on file    Non-medical: Not on file  Tobacco Use  . Smoking status: Former Research scientist (life sciences)  . Smokeless tobacco: Never Used  Substance and Sexual Activity  . Alcohol use: Yes    Alcohol/week: 4.2 oz    Types: 7 Standard drinks or equivalent per week    Comment:  Drinks at least 1 drink a day of different things  . Drug use: No  . Sexual activity: Yes    Birth control/protection: Pill  Lifestyle  . Physical activity:    Days per week: Not on file    Minutes per session: Not on file  . Stress: Not on file  Relationships  . Social connections:    Talks on phone: Not on file    Gets together: Not on file    Attends religious service: Not on file    Active member of club or organization: Not on file    Attends meetings of clubs or organizations: Not on file    Relationship status: Not on file  . Intimate partner violence:    Fear of current or ex partner: Not on file    Emotionally abused: Not on file    Physically abused: Not on file    Forced sexual activity: Not on file  Other Topics Concern  . Not on file  Social History Narrative      Marital status:  Single; parents in [place]: none      Employment: [place] [employer] works indoors       Tobacco: none      Alcohol: socially; twice weekly      Drugs: none      Exercise: sporadic; walking sometimes         Daily caffeine    Family History  Problem Relation Age of Onset  . GI problems Sister   . Asthma Sister   . Anxiety disorder Sister   . Heart disease Maternal Grandmother   . Anxiety disorder Mother   . Depression Mother   . Alcohol abuse Maternal Grandfather   . Anxiety disorder Sister   . Cancer Maternal Aunt        breast  . OCD Maternal Aunt   . Colon cancer Neg Hx        Objective:    BP 114/72 (BP Location: Left Arm, Patient Position: Sitting, Cuff Size: Normal)   Pulse 73   Temp 98.5 F (36.9 C) (Oral)   Resp 16   Ht 5' 6.93" (1.7 m)   Wt 148 lb 3.2 oz (67.2 kg)   LMP 11/24/2017   SpO2 97%   BMI 23.26 kg/m  Physical Exam  Constitutional: She is oriented to person, place, and time. She appears well-developed and well-nourished. No distress.  HENT:  Head: Normocephalic and atraumatic.  Right Ear: Tympanic membrane, external ear and ear  canal normal.  Left Ear: Tympanic membrane, external ear and ear canal normal.  Nose: Mucosal edema present. No rhinorrhea or sinus tenderness.  No foreign bodies. Right sinus exhibits no maxillary sinus tenderness and no frontal sinus tenderness. Left sinus exhibits no maxillary sinus tenderness and no frontal sinus tenderness.  Mouth/Throat: Uvula is midline, oropharynx  is clear and moist and mucous membranes are normal. No oropharyngeal exudate.  Eyes: Pupils are equal, round, and reactive to light. Conjunctivae are normal.  Neck: Normal range of motion. Neck supple.  Cardiovascular: Normal rate, regular rhythm and normal heart sounds. Exam reveals no gallop and no friction rub.  No murmur heard. Pulmonary/Chest: Effort normal and breath sounds normal. She has no wheezes. She has no rales.  Lymphadenopathy:    She has no cervical adenopathy.  Neurological: She is alert and oriented to person, place, and time.  Skin: She is not diaphoretic.  Psychiatric: She has a normal mood and affect. Her behavior is normal. Judgment and thought content normal.  Nursing note and vitals reviewed.  No results found. Depression screen [facility] 2/9 02/20/2018 02/17/2018 02/09/2018 02/02/2018 01/06/2018  Decreased Interest 0 0 0 0 0  Down, Depressed, Hopeless 0 0 0 0 0  PHQ - 2 Score 0 0 0 0 0  Altered sleeping - - - - -  Tired, decreased energy - - - - -  Change in appetite - - - - -  Feeling bad or failure about yourself  - - - - -  Trouble concentrating - - - - -  Moving slowly or fidgety/restless - - - - -  Suicidal thoughts - - - - -  PHQ-9 Score - - - - -  Difficult doing work/chores - - - - -  Some recent data might be hidden   Fall Risk  02/20/2018 02/17/2018 02/09/2018 02/02/2018 01/06/2018  Falls in the past year? No No No No No        Assessment & Plan:   1. Chronic maxillary sinusitis   2. Seasonal allergic rhinitis due to pollen   3. Eustachian tube dysfunction, left    Persistent symptoms  despite repeat antibiotic therapy, two prednisone tapers, allergy medication. S/p ENT consultation; refer to allergy for allergy testing; obtain CT maxillofacial to evaluate further.  Recommend stopping Singulair and Netti pot.    Orders Placed This Encounter  Procedures  . CT Maxillofacial WO CM    Epic order Wt. 148/ no needs Ins- bcbs  Pda/pt      Standing Status:   Future    Standing Expiration Date:   04/09/2019    Order Specific Question:   Is patient pregnant?    Answer:   No    Order Specific Question:   Preferred imaging location?    Answer:   [id number]    Order Specific Question:   Radiology Contrast Protocol - do NOT remove file path    Answer:   [email or web address]  . Ambulatory referral to Allergy    Referral Priority:   Routine    Referral Type:   Allergy Testing    Referral Reason:   Specialty Services Required    Referred to Provider:   Mosetta Anis, MD    Requested Specialty:   Allergy    Number of Visits Requested:   1   No orders of the defined types were placed in this encounter.   No follow-ups on file.   Mary Wyatt, M.D. Primary Care at [facility] previously [facility] [place] [address] [place], [place]  [address] [phone] phone [phone] fax

## 2018-01-06 NOTE — Patient Instructions (Addendum)
  STOP SINGULAIR. STOP NETTI POT.     IF you received an x-ray today, you will receive an invoice from [facility]. Please contact [facility] at [phone] with questions or concerns regarding your invoice.   IF you received labwork today, you will receive an invoice from [place]. Please contact [facility] at [phone] with questions or concerns regarding your invoice.   Our billing staff will not be able to assist you with questions regarding bills from these companies.  You will be contacted with the lab results as soon as they are available. The fastest way to get your results is to activate your My Chart account. Instructions are located on the last page of this paperwork. If you have not heard from [place] regarding the results in 2 weeks, please contact this office.

## 2018-01-11 ENCOUNTER — Ambulatory Visit (INDEPENDENT_AMBULATORY_CARE_PROVIDER_SITE_OTHER): Payer: BLUE CROSS/BLUE SHIELD | Admitting: Clinical

## 2018-01-11 DIAGNOSIS — F411 Generalized anxiety disorder: Secondary | ICD-10-CM

## 2018-01-13 ENCOUNTER — Encounter: Payer: Self-pay | Admitting: Family Medicine

## 2018-01-14 ENCOUNTER — Telehealth: Payer: Self-pay | Admitting: Family Medicine

## 2018-01-14 DIAGNOSIS — J320 Chronic maxillary sinusitis: Secondary | ICD-10-CM

## 2018-01-14 NOTE — Telephone Encounter (Signed)
Pt's CT Maxillofacial has not been approved and is under review with [employer]. They stated this CT is generally not performed for acute sinusitis without risk factors for complications of poorly controlled diabetes mellitus, long-term chronic steroid use (systemic or intranasal), immunocompromised due to an advanced infectious state, neutropenia caused by chemotherapy or disease-modifying therapy, history of solid organ or stem cell transplantation, hematologic malignancy, or chronic renal failure. If one of these is present for pt, I will reinitiate auth and select this. I did not see any of these mentioned in note. This case is currently under review until 01/18/18. The physician can call [employer] at [phone] to do a peer to peer. Member ID number is [id number] and DOB is Feb 03, 1986. I am sending pt MyChart message to let her know we are still trying to get this approved with her insurance. Thanks!

## 2018-01-19 NOTE — Telephone Encounter (Signed)
Checked with [employer] and case is still not approved. Will need to proceed with peer to peer if we would like to try to get this covered. See message below. Pt sent MyChart message checking on status of request as exam is scheduled for 01/27/18

## 2018-01-25 ENCOUNTER — Ambulatory Visit (INDEPENDENT_AMBULATORY_CARE_PROVIDER_SITE_OTHER): Payer: BLUE CROSS/BLUE SHIELD | Admitting: Clinical

## 2018-01-25 DIAGNOSIS — F411 Generalized anxiety disorder: Secondary | ICD-10-CM | POA: Diagnosis not present

## 2018-01-26 NOTE — Telephone Encounter (Signed)
Spoke with pt last week concerning CT that was originally scheduled for 01/27/18 and told her I would let her know by 01/26/18 if we were able to get prior auth. We still have not received this and it looks like pt's exam was rescheduled to 02/06/18. I had to leave pt a message to let her know we still did not have this auth and that I was unsure if exam was rescheduled to try and see if we could get auth, or if pt rescheduled that herself. I told her right now we still do not have auth and I could let her know a couple days before 02/06/18 if we have received this. I also let her know she could cancel this for right now until we receive further instructions if she needs to. Please advise on next steps. Thanks.

## 2018-01-27 ENCOUNTER — Other Ambulatory Visit: Payer: Self-pay

## 2018-02-02 ENCOUNTER — Encounter: Payer: Self-pay | Admitting: Family Medicine

## 2018-02-02 ENCOUNTER — Ambulatory Visit: Payer: BLUE CROSS/BLUE SHIELD | Admitting: Family Medicine

## 2018-02-02 VITALS — BP 116/68 | HR 90 | Temp 98.4°F | Resp 17 | Ht 66.0 in | Wt 152.0 lb

## 2018-02-02 DIAGNOSIS — Z23 Encounter for immunization: Secondary | ICD-10-CM | POA: Diagnosis not present

## 2018-02-02 DIAGNOSIS — T23231A Burn of second degree of multiple right fingers (nail), not including thumb, initial encounter: Secondary | ICD-10-CM | POA: Diagnosis not present

## 2018-02-02 MED ORDER — SILVER SULFADIAZINE 1 % EX CREA: TOPICAL_CREAM | CUTANEOUS | 0 refills | Status: DC

## 2018-02-02 NOTE — Patient Instructions (Addendum)
IF you received an x-ray today, you will receive an invoice from [facility]. Please contact [facility] at [phone] with questions or concerns regarding your invoice.   IF you received labwork today, you will receive an invoice from [facility]. Please contact [facility] at [phone] with questions or concerns regarding your invoice.   Our billing staff will not be able to assist you with questions regarding bills from these companies.  You will be contacted with the lab results as soon as they are available. The fastest way to get your results is to activate your My Chart account. Instructions are located on the last page of this paperwork. If you have not heard from [place] regarding the results in 2 weeks, please contact this office.      Burn Care, Adult A burn is an injury to the skin or the tissues under the skin. There are three types of burns:  First degree. These burns may cause the skin to be red and slightly swollen.  Second degree. These burns are very painful and cause the skin to be very red. The skin may also leak fluid, look shiny, and develop blisters.  Third degree. These burns cause permanent damage. They either turn the skin white or black and make it look charred, dry, and leathery.  Taking care of your burn properly can help to prevent pain and infection. It can also help the burn to heal more quickly. What are the risks? Complications from burns include:  Damage to the skin.  Reduced blood flow near the injury.  Dead tissue.  Scarring.  Problems with movement, if the burn happened near a joint or on the hands or feet.  Severe burns can lead to problems that affect the whole body, such as:  Fluid loss.  Less blood circulating in the body.  Inability to maintain a normal core body temperature (thermoregulation).  Infection.  Shock.  Problems breathing.  How to care for a first-degree burn Right after a burn:  Rinse or  soak the burn under cool water until the pain stops. Do not put ice on your burn. This can cause more damage.  Lightly cover the burn with a sterile cloth (dressing). Burn care  Follow instructions from your health care provider about: ? How to clean and take care of the burn. ? When to change and remove the dressing.  Check your burn every day for signs of infection. Check for: ? More redness, swelling, or pain. ? Warmth. ? Pus or a bad smell. Medicine  Take over-the-counter and prescription medicines only as told by your health care provider.  If you were prescribed antibiotic medicine, take or apply it as told by your health care provider. Do not stop using the antibiotic even if your condition improves. General instructions  To prevent infection, do not put butter, oil, or other home remedies on your burn.  Do not rub your burn, even when you are cleaning it.  Protect your burn from the sun. How to care for a second-degree burn Right after a burn:  Rinse or soak the burn under cool water. Do this for several minutes. Do not put ice on your burn. This can cause more damage.  Lightly cover the burn with a sterile cloth (dressing). Burn care  Raise (elevate) the injured area above the level of your heart while sitting or lying down.  Follow instructions from your health care provider about: ? How to clean and take care of the  burn. ? When to change and remove the dressing.  Check your burn every day for signs of infection. Check for: ? More redness, swelling, or pain. ? Warmth. ? Pus or a bad smell. Medicine   Take over-the-counter and prescription medicines only as told by your health care provider.  If you were prescribed antibiotic medicine, take or apply it as told by your health care provider. Do not stop using the antibiotic even if your condition improves. General instructions  To prevent infection: ? Do not put butter, oil, or other home remedies on the  burn. ? Do not scratch or pick at the burn. ? Do not break any blisters. ? Do not peel skin.  Do not rub your burn, even when you are cleaning it.  Protect your burn from the sun. How to care for a third-degree burn Right after a burn:  Lightly cover the burn with gauze.  Seek immediate medical attention. Burn care  Raise (elevate) the injured area above the level of your heart while sitting or lying down.  Drink enough fluid to keep your urine clear or pale yellow.  Rest as told by your health care provider. Do not participate in sports or other physical activities until your health care provider approves.  Follow instructions from your health care provider about: ? How to clean and take care of the burn. ? When to change and remove the dressing.  Check your burn every day for signs of infection. Check for: ? More redness, swelling, or pain. ? Warmth. ? Pus or a bad smell. Medicine  Take over-the-counter and prescription medicines only as told by your health care provider.  If you were prescribed antibiotic medicine, take or apply it as told by your health care provider. Do not stop using the antibiotic even if your condition improves. General instructions  To prevent infection: ? Do not put butter, oil, or other home remedies on the burn. ? Do not scratch or pick at the burn. ? Do not break any blisters. ? Do not peel skin. ? Do not rub your burn, even when you are cleaning it.  Protect your burn from the sun.  Keep all follow-up visits as told by your health care provider. This is important. Contact a health care provider if:  Your condition does not improve.  Your condition gets worse.  You have a fever.  Your burn changes in appearance or develops black or red spots.  Your burn feels warm to the touch.  Your pain is not controlled with medicine. Get help right away if:  You have redness, swelling, or pain at the site of the burn.  You have fluid,  blood, or pus coming from your burn.  You have red streaks near the burn.  You have severe pain. This information is not intended to replace advice given to you by your health care provider. Make sure you discuss any questions you have with your health care provider. Document Released: 10/06/2005 Document Revised: 04/27/2016 Document Reviewed: 03/25/2016 Elsevier Interactive Patient Education  2018 [employer].

## 2018-02-02 NOTE — Progress Notes (Signed)
Subjective:  By signing my name below, I, Moises Blood, attest that this documentation has been prepared under the direction and in the presence of Delman Cheadle, MD. Electronically Signed: Moises Blood, [place]. 02/02/2018 , 4:01 PM .  Patient was seen in Room 1 .   Patient ID: Mary Wyatt, female    DOB: [birth year], 32 y.o.   MRN: [id number] Chief Complaint  Patient presents with  . burn on hand   HPI Mary Wyatt is a 32 y.o. female who presents to Primary Care at [facility] complaining of burn injury on hand yesterday evening. Patient states she was pouring boiling water, and the top fell off, with water and steam burning her 3rd and 4th right fingers. She describes excruciating pain initially. She applied OTC antibiotic cream over the areas.   She works in an office, typing a lot for work.   Past Medical History:  Diagnosis Date  . Allergy   . Anemia   . Anxiety   . Chest pain   . Depression   . Genital warts   . GERD (gastroesophageal reflux disease)   . Interstitial cystitis   . RMSF [facility] spotted fever) 2015  . Ulcer    Past Surgical History:  Procedure Laterality Date  . OVARIAN CYST REMOVAL    . TONSILLECTOMY     Prior to Admission medications   Medication Sig Start Date End Date Taking? Authorizing Provider  docusate sodium (COLACE) 100 MG capsule Take 100 mg by mouth 2 (two) times daily as needed for mild constipation.    [provider]  hydrOXYzine (VISTARIL) 25 MG capsule Take 1 capsule (25 mg total) by mouth every 8 (eight) hours as needed for anxiety or nausea. 06/10/17   Aundra Dubin, MD  montelukast (SINGULAIR) 10 MG tablet Take 1 tablet (10 mg total) by mouth at bedtime. 12/08/17   Rutherford Guys, MD  Norethin Ace-Eth Estrad-FE 1-20 MG-MCG(24) CHEW TAKE 1 TABLET BY MOUTH EVERY DAY CONTINUOUSLY (SKIPPING PLACEBOS) 12/23/17   [provider]   Allergies  Allergen Reactions  . Diflucan [Fluconazole] Hives    Family History  Problem Relation Age of Onset  . GI problems Sister   . Asthma Sister   . Anxiety disorder Sister   . Heart disease Maternal Grandmother   . Anxiety disorder Mother   . Depression Mother   . Alcohol abuse Maternal Grandfather   . Anxiety disorder Sister   . Cancer Maternal Aunt        breast  . OCD Maternal Aunt   . Colon cancer Neg Hx    Social History   Socioeconomic History  . Marital status: Single    Spouse name: Not on file  . Number of children: 0  . Years of education: Not on file  . Highest education level: Not on file  Occupational History  . Occupation: [employer]  Social Needs  . Financial resource strain: Not on file  . Food insecurity:    Worry: Not on file    Inability: Not on file  . Transportation needs:    Medical: Not on file    Non-medical: Not on file  Tobacco Use  . Smoking status: Former Research scientist (life sciences)  . Smokeless tobacco: Never Used  Substance and Sexual Activity  . Alcohol use: Yes    Alcohol/week: 4.2 oz    Types: 7 Standard drinks or equivalent per week    Comment: Drinks at least 1 drink a day of different things  .  Drug use: No  . Sexual activity: Yes    Birth control/protection: Pill  Lifestyle  . Physical activity:    Days per week: Not on file    Minutes per session: Not on file  . Stress: Not on file  Relationships  . Social connections:    Talks on phone: Not on file    Gets together: Not on file    Attends religious service: Not on file    Active member of club or organization: Not on file    Attends meetings of clubs or organizations: Not on file    Relationship status: Not on file  Other Topics Concern  . Not on file  Social History Narrative      Marital status:  Single; parents in [place]: none      Employment: [employer] works indoors       Tobacco: none      Alcohol: socially; twice weekly      Drugs: none      Exercise: sporadic; walking sometimes         Daily caffeine     Depression screen [facility] 2/9 02/02/2018 01/06/2018 12/23/2017 11/23/2017 11/12/2017  Decreased Interest 0 0 0 0 0  Down, Depressed, Hopeless 0 0 0 0 0  PHQ - 2 Score 0 0 0 0 0  Altered sleeping - - - - -  Tired, decreased energy - - - - -  Change in appetite - - - - -  Feeling bad or failure about yourself  - - - - -  Trouble concentrating - - - - -  Moving slowly or fidgety/restless - - - - -  Suicidal thoughts - - - - -  PHQ-9 Score - - - - -  Difficult doing work/chores - - - - -  Some recent data might be hidden    Review of Systems  Constitutional: Negative for chills, fatigue, fever and unexpected weight change.  Respiratory: Negative for cough.   Gastrointestinal: Negative for constipation, diarrhea, nausea and vomiting.  Musculoskeletal: Positive for myalgias. Negative for arthralgias and joint swelling.  Skin: Positive for wound. Negative for rash.  Neurological: Negative for dizziness, weakness, numbness and headaches.       Objective:   Physical Exam  Constitutional: She is oriented to person, place, and time. She appears well-developed and well-nourished. No distress.  HENT:  Head: Normocephalic and atraumatic.  Eyes: Pupils are equal, round, and reactive to light. EOM are normal.  Neck: Neck supple.  Cardiovascular: Normal rate.  Pulmonary/Chest: Effort normal. No respiratory distress.  Musculoskeletal: Normal range of motion.  Neurological: She is alert and oriented to person, place, and time.  Skin: Skin is warm and dry.  See picture, no burns, lesions or rashes on palmar aspect, blisters on 3rd and 4th from PIP to nail edge   Psychiatric: She has a normal mood and affect. Her behavior is normal.  Nursing note and vitals reviewed.   BP 116/68   Pulse 90   Temp 98.4 F (36.9 C) (Oral)   Resp 17   Ht 5' 6"  (1.676 m)   Wt 152 lb (68.9 kg)   SpO2 98%   BMI 24.53 kg/m            Assessment & Plan:   1. Second degree burn of multiple fingers excluding  thumb, right, initial encounter   wound gently washed with soap and water, applied silver sulfadiazine and appropriated dressed in the  office while teaching pt how to do this at home - can change bandages qd or when they become dirty. Try to avoid popping blisters. No concern for deeper tissue injury at this time w/ no signs of loss of sensation - suspect full recovery -  RTC immed for any increased pain, redness, swelling, or purulent drainage or any other questions/concerns.   Orders Placed This Encounter  Procedures  . Tdap vaccine greater than or equal to 7yo IM    Meds ordered this encounter  Medications  . silver sulfADIAZINE (SILVADENE) 1 % cream    Sig: Apply 1 application topically to burns once to twice daily    Dispense:  50 g    Refill:  0    I personally performed the services described in this documentation, which was scribed in my presence. The recorded information has been reviewed and considered, and addended by me as needed.   Delman Cheadle, M.D.  Primary Care at [facility] [address] [place], [place] [address] [phone] phone [phone] fax  06/28/18 5:21 PM

## 2018-02-06 ENCOUNTER — Other Ambulatory Visit: Payer: Self-pay

## 2018-02-09 ENCOUNTER — Encounter: Payer: Self-pay | Admitting: Family Medicine

## 2018-02-09 ENCOUNTER — Other Ambulatory Visit: Payer: Self-pay

## 2018-02-09 ENCOUNTER — Ambulatory Visit: Payer: BLUE CROSS/BLUE SHIELD | Admitting: Family Medicine

## 2018-02-09 VITALS — BP 102/68 | HR 78 | Temp 98.0°F | Resp 16 | Wt 147.0 lb

## 2018-02-09 DIAGNOSIS — N3010 Interstitial cystitis (chronic) without hematuria: Secondary | ICD-10-CM

## 2018-02-09 DIAGNOSIS — R350 Frequency of micturition: Secondary | ICD-10-CM | POA: Diagnosis not present

## 2018-02-09 DIAGNOSIS — T23231D Burn of second degree of multiple right fingers (nail), not including thumb, subsequent encounter: Secondary | ICD-10-CM | POA: Diagnosis not present

## 2018-02-09 LAB — POCT URINALYSIS DIP (MANUAL ENTRY)
Bilirubin, UA: NEGATIVE
GLUCOSE UA: NEGATIVE mg/dL
NITRITE UA: NEGATIVE
PH UA: 5.5 (ref 5.0–8.0)
PROTEIN UA: NEGATIVE mg/dL
SPEC GRAV UA: 1.01 (ref 1.010–1.025)
UROBILINOGEN UA: 0.2 U/dL

## 2018-02-09 NOTE — Progress Notes (Signed)
Subjective:    Patient ID: Mary Wyatt, female    DOB: November 03, 1985, 32 y.o.   MRN: [id number]  02/09/2018  Second Degree Burn (pt follow-up for OV on 4/16 from burn to right hand/fingers )    HPI This 32 y.o. female presents for evaluation of SECOND DEGREE BURNS OF RIGHT HAND/FINGERS. One week follow-up of second degree burn.  Evaluated by Dr. Brigitte Pulse  Fourth finger with open vesicle.  Denies fever, chills, sweats.  No active drainage.  No redness along the burn sites.  Denies numbness, tingling, burning.  Keeping wounds clean and covered.  Urinary tingling pain with urination; urethra.  Nocturia x 2-3.  Urgency. No coffee; only drinks tea; two servings of caffeine per day.  Not having sex currently; no recent intercourse.  Routine testing; every four month month.     Immunization History  Administered Date(s) Administered  . Hepatitis A, Adult 05/31/2014, 11/30/2014  . Hepatitis B, adult 05/31/2014, 06/28/2014, 11/30/2014  . Influenza,inj,Quad PF,6+ Mos 07/21/2013, 06/28/2014  . Influenza-Unspecified 07/11/2015, 08/24/2016, 07/20/2017  . Tdap 03/03/2012, 02/02/2018    Review of Systems  Constitutional: Negative for chills, diaphoresis, fatigue and fever.  HENT: Negative for ear pain, postnasal drip, rhinorrhea, sinus pressure, sore throat and trouble swallowing.   Respiratory: Negative for cough and shortness of breath.   Cardiovascular: Negative for chest pain, palpitations and leg swelling.  Gastrointestinal: Negative for abdominal pain, constipation, diarrhea, nausea and vomiting.  Genitourinary: Positive for dysuria, frequency and urgency. Negative for decreased urine volume, flank pain, genital sores, hematuria, pelvic pain, vaginal bleeding, vaginal discharge and vaginal pain.  Skin: Positive for wound. Negative for color change, pallor and rash.    Past Medical History:  Diagnosis Date  . Allergy   . Anemia   . Anxiety   . Chest pain   . Depression   . Genital  warts   . GERD (gastroesophageal reflux disease)   . Interstitial cystitis   . RMSF [facility] spotted fever) 2015  . Ulcer    Past Surgical History:  Procedure Laterality Date  . OVARIAN CYST REMOVAL    . TONSILLECTOMY     Allergies  Allergen Reactions  . Diflucan [Fluconazole] Hives   Current Outpatient Medications on File Prior to Visit  Medication Sig Dispense Refill  . docusate sodium (COLACE) 100 MG capsule Take 100 mg by mouth 2 (two) times daily as needed for mild constipation.    . hydrOXYzine (VISTARIL) 25 MG capsule Take 1 capsule (25 mg total) by mouth every 8 (eight) hours as needed for anxiety or nausea. 120 capsule 1  . Norethin Ace-Eth Estrad-FE 1-20 MG-MCG(24) CHEW TAKE 1 TABLET BY MOUTH EVERY DAY CONTINUOUSLY (SKIPPING PLACEBOS)  12   No current facility-administered medications on file prior to visit.    Social History   Socioeconomic History  . Marital status: Single    Spouse name: Not on file  . Number of children: 0  . Years of education: Not on file  . Highest education level: Not on file  Occupational History  . Occupation: [employer]  Social Needs  . Financial resource strain: Not on file  . Food insecurity:    Worry: Not on file    Inability: Not on file  . Transportation needs:    Medical: Not on file    Non-medical: Not on file  Tobacco Use  . Smoking status: Former Research scientist (life sciences)  . Smokeless tobacco: Never Used  Substance and Sexual Activity  . Alcohol use: Yes  Alcohol/week: 4.2 oz    Types: 7 Standard drinks or equivalent per week    Comment: Drinks at least 1 drink a day of different things  . Drug use: No  . Sexual activity: Yes    Birth control/protection: Pill  Lifestyle  . Physical activity:    Days per week: Not on file    Minutes per session: Not on file  . Stress: Not on file  Relationships  . Social connections:    Talks on phone: Not on file    Gets together: Not on file    Attends religious service: Not on file     Active member of club or organization: Not on file    Attends meetings of clubs or organizations: Not on file    Relationship status: Not on file  . Intimate partner violence:    Fear of current or ex partner: Not on file    Emotionally abused: Not on file    Physically abused: Not on file    Forced sexual activity: Not on file  Other Topics Concern  . Not on file  Social History Narrative      Marital status:  Single; parents in [place]: none      Employment: [employer] works indoors       Tobacco: none      Alcohol: socially; twice weekly      Drugs: none      Exercise: sporadic; walking sometimes         Daily caffeine    Family History  Problem Relation Age of Onset  . GI problems Sister   . Asthma Sister   . Anxiety disorder Sister   . Heart disease Maternal Grandmother   . Anxiety disorder Mother   . Depression Mother   . Alcohol abuse Maternal Grandfather   . Anxiety disorder Sister   . Cancer Maternal Aunt        breast  . OCD Maternal Aunt   . Colon cancer Neg Hx        Objective:    BP 102/68   Pulse 78   Temp 98 F (36.7 C) (Oral)   Resp 16   Wt 147 lb (66.7 kg)   SpO2 96%   BMI 23.73 kg/m  Physical Exam  Constitutional: She is oriented to person, place, and time. She appears well-developed and well-nourished. No distress.  HENT:  Head: Normocephalic and atraumatic.  Eyes: Pupils are equal, round, and reactive to light. Conjunctivae are normal.  Neck: Normal range of motion. Neck supple.  Cardiovascular: Normal rate, regular rhythm and normal heart sounds. Exam reveals no gallop and no friction rub.  No murmur heard. Pulmonary/Chest: Effort normal and breath sounds normal. She has no wheezes. She has no rales.  Abdominal: Soft. Bowel sounds are normal. She exhibits no distension and no mass. There is no tenderness. There is no rebound and no guarding. No hernia.  Neurological: She is alert and oriented to person, place, and  time.  Skin: She is not diaphoretic.  Well-healing wound/burn along right fourth digit.  No surrounding erythema, drainage, fluctuance.  Nontender surrounding healing burn.  Slight skin breakdown along finger at site of bandage.  Psychiatric: She has a normal mood and affect. Her behavior is normal.  Nursing note and vitals reviewed.  No results found.        Assessment & Plan:   1. Partial thickness burn of multiple fingers of right hand excluding  thumb, subsequent encounter   2. Urinary frequency   3. Interstitial cystitis     Second-degree burns of multiple fingers of right hand: Healing well at this time.  Recommend removing bandage at bedtime to allow wound healing and to prevent skin breakdown.  Local wound care.  Keep wound covered during the day for the next week and then remove bandage indefinitely.  Return to clinic for fever, redness, drainage, swelling.  Urinary frequency: New onset.  History of interstitial cystitis.  Send urinalysis and urine culture.  Treat if positive culture.  Recommend increase water intake for the next several days.  Orders Placed This Encounter  Procedures  . Urine Culture  . Urine Microscopic  . POCT urinalysis dipstick   No orders of the defined types were placed in this encounter.   No follow-ups on file.   Stalin Gruenberg Elayne Guerin, M.D. Primary Care at [facility] [place] [address] [place], [place]  [address] [phone] phone [phone] fax

## 2018-02-09 NOTE — Patient Instructions (Addendum)
CONTINUE TO USE SILVADENE CREAM TO  3RD AND 4TH FINGERS FOR THE REMAINDER OF THE WEEK. NEXT WEEK, CONTINUE SILVADENE FOURTH FINGER FOR ONE MORE WEEK AND THEN STOP BANDAGING. RETURN FOR PAIN AT BURN SITE, DRAINAGE, FEVER, REDNESS.   IF you received an x-ray today, you will receive an invoice from [facility]. Please contact [facility] at [phone] with questions or concerns regarding your invoice.   IF you received labwork today, you will receive an invoice from [facility]. Please contact [facility] at [phone] with questions or concerns regarding your invoice.   Our billing staff will not be able to assist you with questions regarding bills from these companies.  You will be contacted with the lab results as soon as they are available. The fastest way to get your results is to activate your My Chart account. Instructions are located on the last page of this paperwork. If you have not heard from [place] regarding the results in 2 weeks, please contact this office.      Interstitial Cystitis Interstitial cystitis is a condition that causes inflammation of the bladder. The bladder is a hollow organ in the lower part of your abdomen. It stores urine after the urine is made by your kidneys. With interstitial cystitis, you may have pain in the bladder area. You may also have a frequent and urgent need to urinate. The severity of interstitial cystitis can vary from person to person. You may have flare-ups of the condition, and then it may go away for a while. For many people who have this condition, it becomes a long-term problem. What are the causes? The cause of this condition is not known. What increases the risk? This condition is more likely to develop in women. What are the signs or symptoms? Symptoms of interstitial cystitis vary, and they can change over time. Symptoms may include:  Discomfort or pain in the bladder area. This can range from mild to severe. The pain may  change in intensity as the bladder fills with urine or as it empties.  Pelvic pain.  An urgent need to urinate.  Frequent urination.  Pain during sexual intercourse.  Pinpoint bleeding on the bladder wall.  For women, the symptoms often get worse during menstruation. How is this diagnosed? This condition is diagnosed by evaluating your symptoms and ruling out other causes. A physical exam will be done. Various tests may be done to rule out other conditions. Common tests include:  Urine tests.  Cystoscopy. In this test, a tool that is like a very thin telescope is used to look into your bladder.  Biopsy. This involves taking a sample of tissue from the bladder wall to be examined under a microscope.  How is this treated? There is no cure for interstitial cystitis, but treatment methods are available to control your symptoms. Work closely with your health care provider to find the treatments that will be most effective for you. Treatment options may include:  Medicines to relieve pain and to help reduce the number of times that you feel the need to urinate.  Bladder training. This involves learning ways to control when you urinate, such as: ? Urinating at scheduled times. ? Training yourself to delay urination. ? Doing exercises (Kegel exercises) to strengthen the muscles that control urine flow.  Lifestyle changes, such as changing your diet or taking steps to control stress.  Use of a device that provides electrical stimulation in order to reduce pain.  A procedure that stretches your bladder by  filling it with air or fluid.  Surgery. This is rare. It is only done for extreme cases if other treatments do not help.  Follow these instructions at home:  Take medicines only as directed by your health care provider.  Use bladder training techniques as directed. ? Keep a bladder diary to find out which foods, liquids, or activities make your symptoms worse. ? Use your bladder  diary to schedule bathroom trips. If you are away from home, plan to be near a bathroom at each of your scheduled times. ? Make sure you urinate just before you leave the house and just before you go to bed.  Do Kegel exercises as directed by your health care provider.  Do not drink alcohol.  Do not use any tobacco products, including cigarettes, chewing tobacco, or electronic cigarettes. If you need help quitting, ask your health care provider.  Make dietary changes as directed by your health care provider. You may need to avoid spicy foods and foods that contain a high amount of potassium.  Limit your drinking of beverages that stimulate urination. These include soda, coffee, and tea.  Keep all follow-up visits as directed by your health care provider. This is important. Contact a health care provider if:  Your symptoms do not get better after treatment.  Your pain and discomfort are getting worse.  You have more frequent urges to urinate.  You have a fever. Get help right away if:  You are not able to control your bladder at all. This information is not intended to replace advice given to you by your health care provider. Make sure you discuss any questions you have with your health care provider. Document Released: 06/06/2004 Document Revised: 03/13/2016 Document Reviewed: 06/13/2014 Elsevier Interactive Patient Education  [employer].

## 2018-02-10 LAB — URINALYSIS, MICROSCOPIC ONLY: CASTS: NONE SEEN /LPF

## 2018-02-10 LAB — URINE CULTURE

## 2018-02-15 ENCOUNTER — Ambulatory Visit: Payer: BLUE CROSS/BLUE SHIELD | Admitting: Family Medicine

## 2018-02-17 ENCOUNTER — Ambulatory Visit: Payer: BLUE CROSS/BLUE SHIELD | Admitting: Family Medicine

## 2018-02-17 ENCOUNTER — Ambulatory Visit: Payer: BLUE CROSS/BLUE SHIELD | Admitting: Physician Assistant

## 2018-02-17 ENCOUNTER — Encounter: Payer: Self-pay | Admitting: Physician Assistant

## 2018-02-17 VITALS — BP 110/80 | HR 68 | Temp 98.9°F | Resp 16 | Ht 66.0 in | Wt 147.0 lb

## 2018-02-17 DIAGNOSIS — B9689 Other specified bacterial agents as the cause of diseases classified elsewhere: Secondary | ICD-10-CM

## 2018-02-17 DIAGNOSIS — R350 Frequency of micturition: Secondary | ICD-10-CM

## 2018-02-17 DIAGNOSIS — R319 Hematuria, unspecified: Secondary | ICD-10-CM | POA: Diagnosis not present

## 2018-02-17 DIAGNOSIS — N949 Unspecified condition associated with female genital organs and menstrual cycle: Secondary | ICD-10-CM

## 2018-02-17 DIAGNOSIS — N760 Acute vaginitis: Secondary | ICD-10-CM | POA: Diagnosis not present

## 2018-02-17 DIAGNOSIS — N9489 Other specified conditions associated with female genital organs and menstrual cycle: Secondary | ICD-10-CM

## 2018-02-17 LAB — POCT WET + KOH PREP
Trich by wet prep: ABSENT
Yeast by KOH: ABSENT
Yeast by wet prep: ABSENT

## 2018-02-17 LAB — POCT URINALYSIS DIP (MANUAL ENTRY)
Bilirubin, UA: NEGATIVE
Glucose, UA: NEGATIVE mg/dL
Ketones, POC UA: NEGATIVE mg/dL
Leukocytes, UA: NEGATIVE
Nitrite, UA: NEGATIVE
Protein Ur, POC: NEGATIVE mg/dL
Spec Grav, UA: <=1.005 — AB (ref 1.010–1.025)
Urobilinogen, UA: 0.2 E.U./dL
pH, UA: 6.5 (ref 5.0–8.0)

## 2018-02-17 LAB — POC MICROSCOPIC URINALYSIS (UMFC): Mucus: ABSENT

## 2018-02-17 MED ORDER — METRONIDAZOLE 0.75 % VA GEL: 1.0000 | Freq: Every day | VAGINAL | 0 refills | Status: AC

## 2018-02-17 MED ORDER — METRONIDAZOLE 0.75 % VA GEL: 1.0000 | Freq: Every day | VAGINAL | 0 refills | Status: DC

## 2018-02-17 NOTE — Patient Instructions (Addendum)
Your labs are negative for UTI, BV and yeast (see back page).  (+spec grav = little bit of dehydration) Drink at least 1-2 liters water/daily.   Start Metrogel 1 suppository at night before bed for the next 5 days.   Come back if your symptoms are not improving.   You will get a call to schedule an appointment with urology.   Thank you for coming in today. I hope you feel we met your needs.  Feel free to call PCP if you have any questions or further requests.  Please consider signing up for MyChart if you do not already have it, as this is a great way to communicate with me.  Best,  Whitney McVey, PA-C  IF you received an x-ray today, you will receive an invoice from [facility]. Please contact [facility] at [phone] with questions or concerns regarding your invoice.   IF you received labwork today, you will receive an invoice from [facility]. Please contact [facility] at [phone] with questions or concerns regarding your invoice.   Our billing staff will not be able to assist you with questions regarding bills from these companies.  You will be contacted with the lab results as soon as they are available. The fastest way to get your results is to activate your My Chart account. Instructions are located on the last page of this paperwork. If you have not heard from [place] regarding the results in 2 weeks, please contact this office.

## 2018-02-17 NOTE — Progress Notes (Signed)
Mary Wyatt  MRN: [id number] DOB: 11-Dec-1985  PCP: Wardell Honour, MD  Subjective:  Pt is a 32 year old female who presents to clinic for urinary frequency.   She was here 4/23 for urinary tingling pain with urgency. Not currently sexually active. No recent intercourse. Negative urine culture.   "feels off". Uncomfortable vaginal burning. "pang" in lower abdomen occasionally.  She has had BV in the past, however not recently.  Last 3 weeks she has not been drinking much water - she is drinking about 6 cups/daily.  She applies boric acid today and yesterday.  She is taking OCPs.  Last intercourse was Dec. 2018.  She tested negative for STDs several weeks after that.  She has been diagnosed with interstitial cystitis in the past.    Review of Systems  Constitutional: Negative for chills, fatigue and fever.  Respiratory: Negative for cough, shortness of breath and wheezing.   Cardiovascular: Negative for chest pain and palpitations.  Gastrointestinal: Positive for abdominal pain. Negative for diarrhea, nausea and vomiting.  Genitourinary: Positive for dysuria. Negative for decreased urine volume, difficulty urinating, enuresis, flank pain, frequency, hematuria and urgency.  Musculoskeletal: Negative for back pain.  Neurological: Negative for dizziness, weakness, light-headedness and headaches.    Patient Active Problem List   Diagnosis Date Noted  . Anxiety and depression 06/15/2017  . Family history of brain aneurysm 04/19/2017  . Rhinitis, allergic 04/20/2014  . Generalized anxiety disorder 04/20/2014  . Celiac disease 12/15/2012  . Interstitial cystitis     Current Outpatient Medications on File Prior to Visit  Medication Sig Dispense Refill  . docusate sodium (COLACE) 100 MG capsule Take 100 mg by mouth 2 (two) times daily as needed for mild constipation.    . hydrOXYzine (VISTARIL) 25 MG capsule Take 1 capsule (25 mg total) by mouth every 8 (eight) hours as  needed for anxiety or nausea. 120 capsule 1  . Norethin Ace-Eth Estrad-FE 1-20 MG-MCG(24) CHEW TAKE 1 TABLET BY MOUTH EVERY DAY CONTINUOUSLY (SKIPPING PLACEBOS)  12  . silver sulfADIAZINE (SILVADENE) 1 % cream Apply 1 application topically to burns once to twice daily 50 g 0   No current facility-administered medications on file prior to visit.     Allergies  Allergen Reactions  . Diflucan [Fluconazole] Hives     Objective:  BP 110/80   Pulse 68   Temp 98.9 F (37.2 C) (Oral)   Resp 16   Ht 5' 6"  (1.676 m)   Wt 147 lb (66.7 kg)   SpO2 99%   BMI 23.73 kg/m   Physical Exam  Constitutional: She is oriented to person, place, and time. No distress.  Genitourinary: Uterus normal. Cervix exhibits no discharge. Vaginal discharge (thin, white) found.  Neurological: She is alert and oriented to person, place, and time.  Skin: Skin is warm and dry.  Psychiatric: Judgment normal.  Vitals reviewed.   Assessment and Plan :  1. Bacterial vaginal infection 2. Vaginal burning - Wet prep +bacteria. Plan to treat with metrogel. RTC if no improvement.  - POCT Wet + KOH Prep - metroNIDAZOLE (METROGEL VAGINAL) 0.75 % vaginal gel; Place 1 Applicatorful vaginally at bedtime for 5 days.  Dispense: 70 g; Refill: 0  3. Urinary frequency - UA negative for UTI. Plan to send culture  - Urine Culture - POCT Microscopic Urinalysis ([facility]) - POCT urinalysis dipstick  4. Hematuria, unspecified type - Several samples +hematuria. Plan to send to uology for evaluation.  - Ambulatory referral  to Urology   Mercer Pod, PA-C  [facility] [place] 02/17/2018 3:24 PM

## 2018-02-18 LAB — URINE CULTURE: Organism ID, Bacteria: NO GROWTH

## 2018-02-20 ENCOUNTER — Other Ambulatory Visit: Payer: Self-pay

## 2018-02-20 ENCOUNTER — Ambulatory Visit (INDEPENDENT_AMBULATORY_CARE_PROVIDER_SITE_OTHER): Payer: BLUE CROSS/BLUE SHIELD | Admitting: Physician Assistant

## 2018-02-20 ENCOUNTER — Encounter: Payer: Self-pay | Admitting: Physician Assistant

## 2018-02-20 VITALS — BP 120/86 | HR 74 | Temp 98.8°F | Ht 65.0 in | Wt 148.6 lb

## 2018-02-20 DIAGNOSIS — Z113 Encounter for screening for infections with a predominantly sexual mode of transmission: Secondary | ICD-10-CM | POA: Diagnosis not present

## 2018-02-20 DIAGNOSIS — Z0000 Encounter for general adult medical examination without abnormal findings: Secondary | ICD-10-CM

## 2018-02-20 NOTE — Patient Instructions (Addendum)
When you see Dr. Corinna Capra, make sure that he knows that we coded today's visit as a preventive maintenance visit, so that he uses the GYN code, and that he knows what tests we did here as he considers what tests to order then.    IF you received an x-ray today, you will receive an invoice from [facility]. Please contact [facility] at [phone] with questions or concerns regarding your invoice.   IF you received labwork today, you will receive an invoice from [facility]. Please contact [facility] at [phone] with questions or concerns regarding your invoice.   Our billing staff will not be able to assist you with questions regarding bills from these companies.  You will be contacted with the lab results as soon as they are available. The fastest way to get your results is to activate your My Chart account. Instructions are located on the last page of this paperwork. If you have not heard from [place] regarding the results in 2 weeks, please contact this office.     Preventive Care 18-39 Years, Female Preventive care refers to lifestyle choices and visits with your health care provider that can promote health and wellness. What does preventive care include?  A yearly physical exam. This is also called an annual well check.  Dental exams once or twice a year.  Routine eye exams. Ask your health care provider how often you should have your eyes checked.  Personal lifestyle choices, including: ? Daily care of your teeth and gums. ? Regular physical activity. ? Eating a healthy diet. ? Avoiding tobacco and drug use. ? Limiting alcohol use. ? Practicing safe sex. ? Taking vitamin and mineral supplements as recommended by your health care provider. What happens during an annual well check? The services and screenings done by your health care provider during your annual well check will depend on your age, overall health, lifestyle risk factors, and family history of  disease. Counseling Your health care provider may ask you questions about your:  Alcohol use.  Tobacco use.  Drug use.  Emotional well-being.  Home and relationship well-being.  Sexual activity.  Eating habits.  Work and work Statistician.  Method of birth control.  Menstrual cycle.  Pregnancy history.  Screening You may have the following tests or measurements:  Height, weight, and BMI.  Diabetes screening. This is done by checking your blood sugar (glucose) after you have not eaten for a while (fasting).  Blood pressure.  Lipid and cholesterol levels. These may be checked every 5 years starting at age 58.  Skin check.  Hepatitis C blood test.  Hepatitis B blood test.  Sexually transmitted disease (STD) testing.  BRCA-related cancer screening. This may be done if you have a family history of breast, ovarian, tubal, or peritoneal cancers.  Pelvic exam and Pap test. This may be done every 3 years starting at age 60. Starting at age 28, this may be done every 5 years if you have a Pap test in combination with an HPV test.  Discuss your test results, treatment options, and if necessary, the need for more tests with your health care provider. Vaccines Your health care provider may recommend certain vaccines, such as:  Influenza vaccine. This is recommended every year.  Tetanus, diphtheria, and acellular pertussis (Tdap, Td) vaccine. You may need a Td booster every 10 years.  Varicella vaccine. You may need this if you have not been vaccinated.  HPV vaccine. If you are 59 or younger, you may need  three doses over 6 months.  Measles, mumps, and rubella (MMR) vaccine. You may need at least one dose of MMR. You may also need a second dose.  Pneumococcal 13-valent conjugate (PCV13) vaccine. You may need this if you have certain conditions and were not previously vaccinated.  Pneumococcal polysaccharide (PPSV23) vaccine. You may need one or two doses if you smoke  cigarettes or if you have certain conditions.  Meningococcal vaccine. One dose is recommended if you are age 14-21 years and a first-year college student living in a residence hall, or if you have one of several medical conditions. You may also need additional booster doses.  Hepatitis A vaccine. You may need this if you have certain conditions or if you travel or work in places where you may be exposed to hepatitis A.  Hepatitis B vaccine. You may need this if you have certain conditions or if you travel or work in places where you may be exposed to hepatitis B.  Haemophilus influenzae type b (Hib) vaccine. You may need this if you have certain risk factors.  Talk to your health care provider about which screenings and vaccines you need and how often you need them. This information is not intended to replace advice given to you by your health care provider. Make sure you discuss any questions you have with your health care provider. Document Released: 12/02/2001 Document Revised: 06/25/2016 Document Reviewed: 08/07/2015 Elsevier Interactive Patient Education  [employer].

## 2018-02-20 NOTE — Progress Notes (Signed)
Patient ID: EMNET MONK, female    DOB: 1986/10/18, 32 y.o.   MRN: [id number]  PCP: Wardell Honour, MD  Chief Complaint  Patient presents with  . Exposure to STD    not having any syptoms, just want to rule out any STD. Only want the test her insurance will pay for    Subjective:   Presents for [employer] Visit.  Has an appointment with Asthma/allergy next week, and has been referred to but not yet scheduled with urology for a second opinion regarding IC.  Desires HIV, Hep C and syphilis testing today. Relationship ended in 09/2017. Had GC/CT testing 2 and 5 weeks following the breakup. HSV screening is not covered by her insurance plan. Has had Hep B vaccine series. No previous titer. She denies symptoms of STI.  Recently seen for vaginitis. Diagnosed with and treated for BV. Notes and labs from visits here 4/16, 4/23 and 5/01 reviewed.  Cervical Cancer Screening: scheduled with GYN 05/2017 Breast Cancer Screening: scheduled with GYN 05/2017 Colorectal Cancer Screening: not yet a candidate Bone Density Testing: not yet a candidate HIV Screening: today STI Screening: today Seasonal Influenza Vaccination: annually Td/Tdap Vaccination: 02/02/2018 Pneumococcal Vaccination: not yet a candidate Zoster Vaccination: not yet a candidate Frequency of Dental evaluation: Q6 months Frequency of Eye evaluation: annually   Review of Systems  Constitutional: Negative.   HENT: Negative.   Eyes: Negative.   Respiratory: Negative.   Cardiovascular: Negative.   Gastrointestinal: Negative.   Endocrine: Negative.   Genitourinary:       Recurrent dysuria. Referred to urology for evaluation.  Musculoskeletal: Negative.   Skin: Negative.   Allergic/Immunologic: Negative.   Neurological: Negative.   Hematological: Negative.   Psychiatric/Behavioral: Negative.     Patient Active Problem List   Diagnosis Date Noted  . Anxiety and depression 06/15/2017  . Family history  of brain aneurysm 04/19/2017  . Rhinitis, allergic 04/20/2014  . Generalized anxiety disorder 04/20/2014  . Celiac disease 12/15/2012  . Interstitial cystitis     Past Medical History:  Diagnosis Date  . Allergy   . Anemia   . Anxiety   . Chest pain   . Depression   . Genital warts   . GERD (gastroesophageal reflux disease)   . Interstitial cystitis   . RMS[facility] spotted fever) 2015  . Ulcer      Prior to Admission medications   Medication Sig Start Date End Date Taking? Authorizing Provider  docusate sodium (COLACE) 100 MG capsule Take 100 mg by mouth 2 (two) times daily as needed for mild constipation.   Yes [provider]  hydrOXYzine (VISTARIL) 25 MG capsule Take 1 capsule (25 mg total) by mouth every 8 (eight) hours as needed for anxiety or nausea. 06/10/17  Yes Eksir, Richard Miu, MD  metroNIDAZOLE (METROGEL VAGINAL) 0.75 % vaginal gel Place 1 Applicatorful vaginally at bedtime for 5 days. 02/17/18 02/22/18 Yes McVey, Gelene Mink, PA-C  Norethin Ace-Eth Estrad-FE 1-20 MG-MCG(24) CHEW TAKE 1 TABLET BY MOUTH EVERY DAY CONTINUOUSLY (SKIPPING PLACEBOS) 12/23/17  Yes [provider]  silver sulfADIAZINE (SILVADENE) 1 % cream Apply 1 application topically to burns once to twice daily 02/02/18  Yes Shawnee Knapp, MD    Allergies  Allergen Reactions  . Diflucan [Fluconazole] Hives    Past Surgical History:  Procedure Laterality Date  . OVARIAN CYST REMOVAL    . TONSILLECTOMY      Family History  Problem Relation Age of  Onset  . GI problems Sister   . Asthma Sister   . Anxiety disorder Sister   . Heart disease Maternal Grandmother   . Anxiety disorder Mother   . Depression Mother   . Alcohol abuse Maternal Grandfather   . Anxiety disorder Sister   . Cancer Maternal Aunt        breast  . OCD Maternal Aunt   . Colon cancer Neg Hx     Social History   Socioeconomic History  . Marital status: Single    Spouse name: Not on file  .  Number of children: 0  . Years of education: Not on file  . Highest education level: Not on file  Occupational History  . Occupation: [employer]  Social Needs  . Financial resource strain: Not on file  . Food insecurity:    Worry: Not on file    Inability: Not on file  . Transportation needs:    Medical: Not on file    Non-medical: Not on file  Tobacco Use  . Smoking status: Former Research scientist (life sciences)  . Smokeless tobacco: Never Used  Substance and Sexual Activity  . Alcohol use: Yes    Alcohol/week: 4.2 oz    Types: 7 Standard drinks or equivalent per week    Comment: Drinks at least 1 drink a day of different things  . Drug use: No  . Sexual activity: Yes    Birth control/protection: Pill  Lifestyle  . Physical activity:    Days per week: Not on file    Minutes per session: Not on file  . Stress: Not on file  Relationships  . Social connections:    Talks on phone: Not on file    Gets together: Not on file    Attends religious service: Not on file    Active member of club or organization: Not on file    Attends meetings of clubs or organizations: Not on file    Relationship status: Not on file  Other Topics Concern  . Not on file  Social History Narrative      Marital status:  Single; parents in [place]: none      Employment: [place] [employer] works indoors       Tobacco: none      Alcohol: socially; twice weekly      Drugs: none      Exercise: sporadic; walking sometimes         Daily caffeine         Objective:  Physical Exam  Constitutional: She is oriented to person, place, and time. Vital signs are normal. She appears well-developed and well-nourished. She is active and cooperative. No distress.  BP 120/86 (BP Location: Left Arm, Patient Position: Sitting, Cuff Size: Normal)   Pulse 74   Temp 98.8 F (37.1 C) (Oral)   Ht 5' 5"  (1.651 m)   Wt 148 lb 9.6 oz (67.4 kg)   SpO2 100%   BMI 24.73 kg/m    HENT:  Head: Normocephalic and atraumatic.  Right  Ear: Hearing, tympanic membrane, external ear and ear canal normal. No foreign bodies.  Left Ear: Hearing, tympanic membrane, external ear and ear canal normal. No foreign bodies.  Nose: Nose normal.  Mouth/Throat: Uvula is midline, oropharynx is clear and moist and mucous membranes are normal. No oral lesions. Normal dentition. No dental abscesses or uvula swelling. No oropharyngeal exudate.  Eyes: Pupils are equal, round, and reactive to light. Conjunctivae, EOM and lids  are normal. Right eye exhibits no discharge. Left eye exhibits no discharge. No scleral icterus.  Fundoscopic exam:      The right eye shows no arteriolar narrowing, no AV nicking, no exudate, no hemorrhage and no papilledema. The right eye shows red reflex.       The left eye shows no arteriolar narrowing, no AV nicking, no exudate, no hemorrhage and no papilledema. The left eye shows red reflex.  Neck: Trachea normal, normal range of motion and full passive range of motion without pain. Neck supple. No spinous process tenderness and no muscular tenderness present. No thyroid mass and no thyromegaly present.  Cardiovascular: Normal rate, regular rhythm, normal heart sounds, intact distal pulses and normal pulses.  Pulmonary/Chest: Effort normal and breath sounds normal.  Musculoskeletal: She exhibits no edema or tenderness.       Cervical back: Normal.       Thoracic back: Normal.       Lumbar back: Normal.  Lymphadenopathy:       Head (right side): No tonsillar, no preauricular, no posterior auricular and no occipital adenopathy present.       Head (left side): No tonsillar, no preauricular, no posterior auricular and no occipital adenopathy present.    She has no cervical adenopathy.       Right: No supraclavicular adenopathy present.       Left: No supraclavicular adenopathy present.  Neurological: She is alert and oriented to person, place, and time. She has normal strength and normal reflexes. No cranial nerve deficit.  She exhibits normal muscle tone. Coordination and gait normal.  Skin: Skin is warm, dry and intact. No rash noted. She is not diaphoretic. No cyanosis or erythema. Nails show no clubbing.  Psychiatric: Her speech is normal and behavior is normal. Judgment and thought content normal. Her mood appears anxious. Her affect is not angry, not blunt, not labile and not inappropriate. She is not actively hallucinating. Cognition and memory are normal. She does not exhibit a depressed mood. She is attentive.     Wt Readings from Last 3 Encounters:  02/20/18 148 lb 9.6 oz (67.4 kg)  02/17/18 147 lb (66.7 kg)  02/09/18 147 lb (66.7 kg)       Assessment & Plan:  1. Annual physical exam Age appropriate anticipatory guidance provided. GYN components deferred to visit 05/2017 with Dr. Corinna Capra.  2. Routine screening for STI (sexually transmitted infection) Counseled on hepatitis B (recommend titer at some point to verify immunity). - HIV antibody - RPR - Hepatitis C antibody    No follow-ups on file.   Fara Chute, PA-C [facility] [place]

## 2018-02-21 LAB — HEPATITIS C ANTIBODY: Hep C Virus Ab: <0.1 s/co ratio (ref 0.0–0.9)

## 2018-02-21 LAB — HIV ANTIBODY (ROUTINE TESTING W REFLEX): HIV Screen 4th Generation wRfx: NONREACTIVE

## 2018-02-21 LAB — RPR: RPR Ser Ql: NONREACTIVE

## 2018-02-24 DIAGNOSIS — R05 Cough: Secondary | ICD-10-CM | POA: Diagnosis not present

## 2018-02-24 DIAGNOSIS — H1045 Other chronic allergic conjunctivitis: Secondary | ICD-10-CM | POA: Diagnosis not present

## 2018-02-24 DIAGNOSIS — J309 Allergic rhinitis, unspecified: Secondary | ICD-10-CM | POA: Diagnosis not present

## 2018-02-27 ENCOUNTER — Ambulatory Visit: Payer: BLUE CROSS/BLUE SHIELD | Admitting: Family Medicine

## 2018-02-27 ENCOUNTER — Other Ambulatory Visit: Payer: Self-pay

## 2018-02-27 ENCOUNTER — Encounter: Payer: Self-pay | Admitting: Family Medicine

## 2018-02-27 VITALS — BP 102/70 | HR 86 | Temp 98.0°F | Resp 16 | Ht 66.14 in | Wt 149.0 lb

## 2018-02-27 DIAGNOSIS — Z7251 High risk heterosexual behavior: Secondary | ICD-10-CM | POA: Diagnosis not present

## 2018-02-27 DIAGNOSIS — L308 Other specified dermatitis: Secondary | ICD-10-CM | POA: Diagnosis not present

## 2018-02-27 DIAGNOSIS — N9489 Other specified conditions associated with female genital organs and menstrual cycle: Secondary | ICD-10-CM

## 2018-02-27 DIAGNOSIS — N949 Unspecified condition associated with female genital organs and menstrual cycle: Secondary | ICD-10-CM

## 2018-02-27 LAB — POCT WET + KOH PREP
TRICH BY WET PREP: ABSENT
YEAST BY WET PREP: ABSENT
Yeast by KOH: ABSENT

## 2018-02-27 MED ORDER — TRIAMCINOLONE ACETONIDE 0.1 % EX CREA: 1.0000 "application " | TOPICAL_CREAM | Freq: Two times a day (BID) | CUTANEOUS | 1 refills | Status: DC

## 2018-02-27 NOTE — Patient Instructions (Signed)
     IF you received an x-ray today, you will receive an invoice from [facility]. Please contact  Radiology at [phone] with questions or concerns regarding your invoice.   IF you received labwork today, you will receive an invoice from [facility]. Please contact [facility] at [phone] with questions or concerns regarding your invoice.   Our billing staff will not be able to assist you with questions regarding bills from these companies.  You will be contacted with the lab results as soon as they are available. The fastest way to get your results is to activate your My Chart account. Instructions are located on the last page of this paperwork. If you have not heard from us regarding the results in 2 weeks, please contact this office.     

## 2018-02-27 NOTE — Progress Notes (Signed)
Subjective:    Patient ID: Mary Wyatt, female    DOB: 1986-09-27, 32 y.o.   MRN: [id number]  02/27/2018  Vaginal Burning    HPI This 32 y.o. female presents for evaluation of vaginal burning. S/P STD screening. Got really stressed out; lack of trust. Not sure of testing. Treated with metrogel; not 100% improved; clumpy discharge; might still be metrogel; last dose one week ago. Decided to take boric acid before came in.  85% improved.  Dull aching.   Previous HSV testing.  No cold sore history.   Previous boyfriend with genital bumps.  No warts.  Folliculitis.    BP Readings from Last 3 Encounters:  03/31/18 102/72  02/27/18 102/70  02/20/18 120/86   Wt Readings from Last 3 Encounters:  03/31/18 147 lb (66.7 kg)  02/27/18 149 lb (67.6 kg)  02/20/18 148 lb 9.6 oz (67.4 kg)   Immunization History  Administered Date(s) Administered  . Hepatitis A, Adult 05/31/2014, 11/30/2014  . Hepatitis B, adult 05/31/2014, 06/28/2014, 11/30/2014  . Influenza,inj,Quad PF,6+ Mos 07/21/2013, 06/28/2014  . Influenza-Unspecified 07/11/2015, 08/24/2016, 07/20/2017  . Tdap 03/03/2012, 02/02/2018    Review of Systems  Constitutional: Negative for chills, diaphoresis, fatigue and fever.  HENT: Negative for ear pain, postnasal drip, rhinorrhea, sinus pressure, sore throat and trouble swallowing.   Respiratory: Negative for cough and shortness of breath.   Cardiovascular: Negative for chest pain, palpitations and leg swelling.  Gastrointestinal: Negative for abdominal distention, abdominal pain, anal bleeding, blood in stool, constipation, diarrhea, nausea, rectal pain and vomiting.  Genitourinary: Positive for vaginal discharge and vaginal pain. Negative for decreased urine volume, difficulty urinating, dyspareunia, dysuria, flank pain, frequency, genital sores, hematuria, menstrual problem, pelvic pain, urgency and vaginal bleeding.    Past Medical History:  Diagnosis Date  . Allergy    . Anemia   . Anxiety   . Chest pain   . Depression   . Genital warts   . GERD (gastroesophageal reflux disease)   . Interstitial cystitis   . RMSF [facility] spotted fever) 2015  . Ulcer    Past Surgical History:  Procedure Laterality Date  . OVARIAN CYST REMOVAL    . TONSILLECTOMY     Allergies  Allergen Reactions  . Diflucan [Fluconazole] Hives   Current Outpatient Medications on File Prior to Visit  Medication Sig Dispense Refill  . docusate sodium (COLACE) 100 MG capsule Take 100 mg by mouth 2 (two) times daily as needed for mild constipation.    . hydrOXYzine (VISTARIL) 25 MG capsule Take 1 capsule (25 mg total) by mouth every 8 (eight) hours as needed for anxiety or nausea. 120 capsule 1  . Norethin Ace-Eth Estrad-FE 1-20 MG-MCG(24) CHEW TAKE 1 TABLET BY MOUTH EVERY DAY CONTINUOUSLY (SKIPPING PLACEBOS)  12   No current facility-administered medications on file prior to visit.    Social History   Socioeconomic History  . Marital status: Single    Spouse name: Not on file  . Number of children: 0  . Years of education: Not on file  . Highest education level: Not on file  Occupational History  . Occupation: [employer]  Social Needs  . Financial resource strain: Not on file  . Food insecurity:    Worry: Not on file    Inability: Not on file  . Transportation needs:    Medical: Not on file    Non-medical: Not on file  Tobacco Use  . Smoking status: Former Research scientist (life sciences)  . Smokeless tobacco:  Never Used  Substance and Sexual Activity  . Alcohol use: Yes    Alcohol/week: 4.2 oz    Types: 7 Standard drinks or equivalent per week    Comment: Drinks at least 1 drink a day of different things  . Drug use: No  . Sexual activity: Yes    Birth control/protection: Pill  Lifestyle  . Physical activity:    Days per week: Not on file    Minutes per session: Not on file  . Stress: Not on file  Relationships  . Social connections:    Talks on phone: Not on file    Gets  together: Not on file    Attends religious service: Not on file    Active member of club or organization: Not on file    Attends meetings of clubs or organizations: Not on file    Relationship status: Not on file  . Intimate partner violence:    Fear of current or ex partner: Not on file    Emotionally abused: Not on file    Physically abused: Not on file    Forced sexual activity: Not on file  Other Topics Concern  . Not on file  Social History Narrative      Marital status:  Single; parents in [place]: none      Employment: [employer] works indoors       Tobacco: none      Alcohol: socially; twice weekly      Drugs: none      Exercise: sporadic; walking sometimes         Daily caffeine    Family History  Problem Relation Age of Onset  . GI problems Sister   . Asthma Sister   . Anxiety disorder Sister   . Heart disease Maternal Grandmother   . Anxiety disorder Mother   . Depression Mother   . Alcohol abuse Maternal Grandfather   . Anxiety disorder Sister   . Cancer Maternal Aunt        breast  . OCD Maternal Aunt   . Colon cancer Neg Hx        Objective:    BP 102/70   Pulse 86   Temp 98 F (36.7 C) (Oral)   Resp 16   Ht 5' 6.14" (1.68 m)   Wt 149 lb (67.6 kg)   SpO2 98%   BMI 23.95 kg/m  Physical Exam  Constitutional: She is oriented to person, place, and time. She appears well-developed and well-nourished. No distress.  HENT:  Head: Normocephalic and atraumatic.  Eyes: Pupils are equal, round, and reactive to light. Conjunctivae are normal.  Neck: Normal range of motion. Neck supple.  Cardiovascular: Normal rate, regular rhythm and normal heart sounds. Exam reveals no gallop and no friction rub.  No murmur heard. Pulmonary/Chest: Effort normal and breath sounds normal. She has no wheezes. She has no rales.  Genitourinary: Vagina normal and uterus normal. There is no rash, tenderness, lesion or injury on the right labia. There is no  rash, tenderness, lesion or injury on the left labia. Cervix exhibits no motion tenderness, no discharge and no friability. Right adnexum displays no mass, no tenderness and no fullness. Left adnexum displays no mass, no tenderness and no fullness. No vaginal discharge found.  Neurological: She is alert and oriented to person, place, and time.  Skin: Rash noted. She is not diaphoretic.  Psychiatric: She has a normal mood and affect. Her behavior is  normal.  Nursing note and vitals reviewed.  No results found. Depression screen [facility] 2/9 03/31/2018 02/27/2018 02/27/2018 02/20/2018 02/17/2018  Decreased Interest 0 0 0 0 0  Down, Depressed, Hopeless 0 0 0 0 0  PHQ - 2 Score 0 0 0 0 0  Altered sleeping - - - - -  Tired, decreased energy - - - - -  Change in appetite - - - - -  Feeling bad or failure about yourself  - - - - -  Trouble concentrating - - - - -  Moving slowly or fidgety/restless - - - - -  Suicidal thoughts - - - - -  PHQ-9 Score - - - - -  Difficult doing work/chores - - - - -  Some recent data might be hidden   Fall Risk  03/31/2018 02/27/2018 02/27/2018 02/20/2018 02/17/2018  Falls in the past year? No No No No No         Assessment & Plan:   1. Vaginal burning   2. High risk heterosexual behavior   3. Other eczema     Vaginal burning: Persistent despite Metro gel vaginal suppositories.  Wet prep negative in office today. High risk heterosexual behavior: New.  Obtain hepatitis B surface antibody and antigen as well as HSV IgG of 1 and 2. Eczema: Chronic with recent acute exacerbation.  Refill of triamcinolone cream provided.  Orders Placed This Encounter  Procedures  . Hepatitis B surface antibody  . Hepatitis B surface antigen  . HSV(herpes simplex vrs) 1+2 ab-IgG  . POCT Wet + KOH Prep   Meds ordered this encounter  Medications  . triamcinolone cream (KENALOG) 0.1 %    Sig: Apply 1 application topically 2 (two) times daily.    Dispense:  30 g    Refill:  1    No  follow-ups on file.   Payeton Germani Elayne Guerin, M.D. Primary Care at [facility] previously [facility] [place] [address] [place], [place]  [address] [phone] phone [phone] fax,s

## 2018-02-28 LAB — HEPATITIS B SURFACE ANTIBODY, QUANTITATIVE: Hepatitis B Surf Ab Quant: >1000 m[IU]/mL (ref >9.9)

## 2018-02-28 LAB — HEPATITIS B SURFACE ANTIGEN: Hepatitis B Surface Ag: NEGATIVE

## 2018-02-28 LAB — HSV(HERPES SIMPLEX VRS) I + II AB-IGG: HSV 2 IgG, Type Spec: <0.91 index (ref 0.00–0.90)

## 2018-03-03 ENCOUNTER — Ambulatory Visit: Payer: BLUE CROSS/BLUE SHIELD | Admitting: Family Medicine

## 2018-03-05 ENCOUNTER — Encounter: Payer: Self-pay | Admitting: Family Medicine

## 2018-03-10 ENCOUNTER — Encounter: Payer: Self-pay | Admitting: Family Medicine

## 2018-03-11 ENCOUNTER — Encounter: Payer: Self-pay | Admitting: Physician Assistant

## 2018-03-31 ENCOUNTER — Encounter: Payer: Self-pay | Admitting: Family Medicine

## 2018-03-31 ENCOUNTER — Ambulatory Visit: Payer: BLUE CROSS/BLUE SHIELD | Admitting: Family Medicine

## 2018-03-31 ENCOUNTER — Other Ambulatory Visit: Payer: Self-pay

## 2018-03-31 VITALS — BP 102/72 | HR 104 | Temp 98.2°F | Resp 16 | Wt 147.0 lb

## 2018-03-31 DIAGNOSIS — R590 Localized enlarged lymph nodes: Secondary | ICD-10-CM

## 2018-03-31 DIAGNOSIS — T148XXA Other injury of unspecified body region, initial encounter: Secondary | ICD-10-CM

## 2018-03-31 DIAGNOSIS — S0000XA Unspecified superficial injury of scalp, initial encounter: Secondary | ICD-10-CM

## 2018-03-31 DIAGNOSIS — R112 Nausea with vomiting, unspecified: Secondary | ICD-10-CM

## 2018-03-31 LAB — CBC WITH DIFFERENTIAL/PLATELET
BASOS: 1 %
Basophils Absolute: 0 10*3/uL (ref 0.0–0.2)
EOS (ABSOLUTE): 0.1 10*3/uL (ref 0.0–0.4)
Eos: 3 %
HEMATOCRIT: 41.1 % (ref 34.0–46.6)
Hemoglobin: 13.8 g/dL (ref 11.1–15.9)
IMMATURE GRANULOCYTES: 0 %
Immature Grans (Abs): 0 10*3/uL (ref 0.0–0.1)
LYMPHS ABS: 1.6 10*3/uL (ref 0.7–3.1)
Lymphs: 39 %
MCH: 31.3 pg (ref 26.6–33.0)
MCHC: 33.6 g/dL (ref 31.5–35.7)
MCV: 93 fL (ref 79–97)
Monocytes Absolute: 0.2 10*3/uL (ref 0.1–0.9)
Monocytes: 4 %
NEUTROS ABS: 2.1 10*3/uL (ref 1.4–7.0)
NEUTROS PCT: 53 %
Platelets: 259 10*3/uL (ref 150–450)
RBC: 4.41 x10E6/uL (ref 3.77–5.28)
RDW: 12.7 % (ref 12.3–15.4)
WBC: 4.1 10*3/uL (ref 3.4–10.8)

## 2018-03-31 NOTE — Patient Instructions (Addendum)
  ZANTAC/RANITIDINE 150MG ONE TABLET TWICE DAILY BEFORE BREAKFAST AND BEFORE SUPPER FOR TWO WEEKS.  Take Tylenol for pain at lymph node.  IF you received an x-ray today, you will receive an invoice from [facility]. Please contact [facility] at [phone] with questions or concerns regarding your invoice.   IF you received labwork today, you will receive an invoice from [facility]. Please contact [facility] at [phone] with questions or concerns regarding your invoice.   Our billing staff will not be able to assist you with questions regarding bills from these companies.  You will be contacted with the lab results as soon as they are available. The fastest way to get your results is to activate your My Chart account. Instructions are located on the last page of this paperwork. If you have not heard from [place] regarding the results in 2 weeks, please contact this office.

## 2018-03-31 NOTE — Progress Notes (Deleted)
Subjective:    Patient ID: Mary Wyatt, female    DOB: 18-Apr-1986, 32 y.o.   MRN: [id number]  03/31/2018  Mass (pt. states she noticed a large lump on the back of her head Sat. pt states the lump is painful ) and Nausea (x 2 weeks )    HPI This 32 y.o. female presents for evaluation of ***. BP Readings from Last 3 Encounters:  03/31/18 102/72  02/27/18 102/70  02/20/18 120/86   Wt Readings from Last 3 Encounters:  03/31/18 147 lb (66.7 kg)  02/27/18 149 lb (67.6 kg)  02/20/18 148 lb 9.6 oz (67.4 kg)   Immunization History  Administered Date(s) Administered  . Hepatitis A, Adult 05/31/2014, 11/30/2014  . Hepatitis B, adult 05/31/2014, 06/28/2014, 11/30/2014  . Influenza,inj,Quad PF,6+ Mos 07/21/2013, 06/28/2014  . Influenza-Unspecified 07/11/2015, 08/24/2016, 07/20/2017  . Tdap 03/03/2012, 02/02/2018    Review of Systems  Past Medical History:  Diagnosis Date  . Allergy   . Anemia   . Anxiety   . Chest pain   . Depression   . Genital warts   . GERD (gastroesophageal reflux disease)   . Interstitial cystitis   . RMSF [facility] spotted fever) 2015  . Ulcer    Past Surgical History:  Procedure Laterality Date  . OVARIAN CYST REMOVAL    . TONSILLECTOMY     Allergies  Allergen Reactions  . Diflucan [Fluconazole] Hives   Current Outpatient Medications on File Prior to Visit  Medication Sig Dispense Refill  . docusate sodium (COLACE) 100 MG capsule Take 100 mg by mouth 2 (two) times daily as needed for mild constipation.    . hydrOXYzine (VISTARIL) 25 MG capsule Take 1 capsule (25 mg total) by mouth every 8 (eight) hours as needed for anxiety or nausea. 120 capsule 1  . Norethin Ace-Eth Estrad-FE 1-20 MG-MCG(24) CHEW TAKE 1 TABLET BY MOUTH EVERY DAY CONTINUOUSLY (SKIPPING PLACEBOS)  12  . triamcinolone cream (KENALOG) 0.1 % Apply 1 application topically 2 (two) times daily. 30 g 1   No current facility-administered medications on file prior to  visit.    Social History   Socioeconomic History  . Marital status: Single    Spouse name: Not on file  . Number of children: 0  . Years of education: Not on file  . Highest education level: Not on file  Occupational History  . Occupation: [employer]  Social Needs  . Financial resource strain: Not on file  . Food insecurity:    Worry: Not on file    Inability: Not on file  . Transportation needs:    Medical: Not on file    Non-medical: Not on file  Tobacco Use  . Smoking status: Former Research scientist (life sciences)  . Smokeless tobacco: Never Used  Substance and Sexual Activity  . Alcohol use: Yes    Alcohol/week: 4.2 oz    Types: 7 Standard drinks or equivalent per week    Comment: Drinks at least 1 drink a day of different things  . Drug use: No  . Sexual activity: Yes    Birth control/protection: Pill  Lifestyle  . Physical activity:    Days per week: Not on file    Minutes per session: Not on file  . Stress: Not on file  Relationships  . Social connections:    Talks on phone: Not on file    Gets together: Not on file    Attends religious service: Not on file    Active member of  club or organization: Not on file    Attends meetings of clubs or organizations: Not on file    Relationship status: Not on file  . Intimate partner violence:    Fear of current or ex partner: Not on file    Emotionally abused: Not on file    Physically abused: Not on file    Forced sexual activity: Not on file  Other Topics Concern  . Not on file  Social History Narrative      Marital status:  Single; parents in [place]: none      Employment: [employer] works indoors       Tobacco: none      Alcohol: socially; twice weekly      Drugs: none      Exercise: sporadic; walking sometimes         Daily caffeine    Family History  Problem Relation Age of Onset  . GI problems Sister   . Asthma Sister   . Anxiety disorder Sister   . Heart disease Maternal Grandmother   . Anxiety disorder  Mother   . Depression Mother   . Alcohol abuse Maternal Grandfather   . Anxiety disorder Sister   . Cancer Maternal Aunt        breast  . OCD Maternal Aunt   . Colon cancer Neg Hx        Objective:    BP 102/72   Pulse (!) 104   Temp 98.2 F (36.8 C) (Oral)   Resp 16   Wt 147 lb (66.7 kg)   SpO2 100%   BMI 23.62 kg/m  Physical Exam No results found. Depression screen [facility] 2/9 03/31/2018 02/27/2018 02/27/2018 02/20/2018 02/17/2018  Decreased Interest 0 0 0 0 0  Down, Depressed, Hopeless 0 0 0 0 0  PHQ - 2 Score 0 0 0 0 0  Altered sleeping - - - - -  Tired, decreased energy - - - - -  Change in appetite - - - - -  Feeling bad or failure about yourself  - - - - -  Trouble concentrating - - - - -  Moving slowly or fidgety/restless - - - - -  Suicidal thoughts - - - - -  PHQ-9 Score - - - - -  Difficult doing work/chores - - - - -  Some recent data might be hidden   Fall Risk  03/31/2018 02/27/2018 02/27/2018 02/20/2018 02/17/2018  Falls in the past year? No No No No No        Assessment & Plan:  No diagnosis found.  ***  No orders of the defined types were placed in this encounter.  No orders of the defined types were placed in this encounter.   No follow-ups on file.   Aleatha Taite Elayne Guerin, M.D. Primary Care at [facility] previously [facility] [place] [address] [place], [place]  [address] [phone] phone [phone] fax

## 2018-03-31 NOTE — Progress Notes (Signed)
Subjective:    Patient ID: Mary Wyatt, female    DOB: 06/30/1986, 32 y.o.   MRN: [id number]  03/31/2018  Mass (pt. states she noticed a large lump on the back of her head Sat. pt states the lump is painful ) and Nausea (x 2 weeks )    HPI This 32 y.o. female presents for large lump on the back of head.   Weird lump on R side of neck and really hurts.  Woke up five days ago with soreness; found a lump; also had a zit on hairline; not sure if related; this is separate.   No tick bites. Denies fever/chills/sweats.  Denies headaches. Denies scalp irritation other than insect bite.  Denies dental issues.  Denies ear pain, sore throat, rhinorrhea, nasal congestion.   Nausea for two weeks; no vomiting/diarrhea/constipation; no abdominal pain; no dysuria, urgency, frequency, hematuria.  No late menses.   BP Readings from Last 3 Encounters:  03/31/18 102/72  02/27/18 102/70  02/20/18 120/86   Wt Readings from Last 3 Encounters:  03/31/18 147 lb (66.7 kg)  02/27/18 149 lb (67.6 kg)  02/20/18 148 lb 9.6 oz (67.4 kg)   Immunization History  Administered Date(s) Administered  . Hepatitis A, Adult 05/31/2014, 11/30/2014  . Hepatitis B, adult 05/31/2014, 06/28/2014, 11/30/2014  . Influenza,inj,Quad PF,6+ Mos 07/21/2013, 06/28/2014  . Influenza-Unspecified 07/11/2015, 08/24/2016, 07/20/2017  . Tdap 03/03/2012, 02/02/2018    Review of Systems  Constitutional: Negative for activity change, appetite change, chills, diaphoresis, fatigue, fever and unexpected weight change.  HENT: Negative for congestion, dental problem, drooling, ear discharge, ear pain, facial swelling, hearing loss, mouth sores, nosebleeds, postnasal drip, rhinorrhea, sinus pressure, sneezing, sore throat, tinnitus, trouble swallowing and voice change.   Eyes: Negative for photophobia, pain, discharge, redness, itching and visual disturbance.  Respiratory: Negative for apnea, cough, choking, chest tightness, shortness  of breath, wheezing and stridor.   Cardiovascular: Negative for chest pain, palpitations and leg swelling.  Gastrointestinal: Positive for nausea. Negative for abdominal distention, abdominal pain, anal bleeding, blood in stool, constipation, diarrhea, rectal pain and vomiting.  Endocrine: Negative for cold intolerance, heat intolerance, polydipsia, polyphagia and polyuria.  Genitourinary: Negative for decreased urine volume, difficulty urinating, dyspareunia, dysuria, enuresis, flank pain, frequency, genital sores, hematuria, menstrual problem, pelvic pain, urgency, vaginal bleeding, vaginal discharge and vaginal pain.  Musculoskeletal: Negative for arthralgias, back pain, gait problem, joint swelling, myalgias, neck pain and neck stiffness.  Skin: Negative for color change, pallor, rash and wound.  Allergic/Immunologic: Negative for environmental allergies, food allergies and immunocompromised state.  Neurological: Negative for dizziness, tremors, seizures, syncope, facial asymmetry, speech difficulty, weakness, light-headedness, numbness and headaches.  Hematological: Positive for adenopathy. Does not bruise/bleed easily.  Psychiatric/Behavioral: Negative for agitation, behavioral problems, confusion, decreased concentration, dysphoric mood, hallucinations, self-injury, sleep disturbance and suicidal ideas. The patient is not nervous/anxious and is not hyperactive.     Past Medical History:  Diagnosis Date  . Allergy   . Anemia   . Anxiety   . Chest pain   . Depression   . Genital warts   . GERD (gastroesophageal reflux disease)   . Interstitial cystitis   . RMSF [facility] spotted fever) 2015  . Ulcer    Past Surgical History:  Procedure Laterality Date  . OVARIAN CYST REMOVAL    . TONSILLECTOMY     Allergies  Allergen Reactions  . Diflucan [Fluconazole] Hives   Current Outpatient Medications on File Prior to Visit  Medication Sig Dispense Refill  .  docusate sodium  (COLACE) 100 MG capsule Take 100 mg by mouth 2 (two) times daily as needed for mild constipation.    . hydrOXYzine (VISTARIL) 25 MG capsule Take 1 capsule (25 mg total) by mouth every 8 (eight) hours as needed for anxiety or nausea. 120 capsule 1  . Norethin Ace-Eth Estrad-FE 1-20 MG-MCG(24) CHEW TAKE 1 TABLET BY MOUTH EVERY DAY CONTINUOUSLY (SKIPPING PLACEBOS)  12  . triamcinolone cream (KENALOG) 0.1 % Apply 1 application topically 2 (two) times daily. 30 g 1   No current facility-administered medications on file prior to visit.    Social History   Socioeconomic History  . Marital status: Single    Spouse name: Not on file  . Number of children: 0  . Years of education: Not on file  . Highest education level: Not on file  Occupational History  . Occupation: [employer]  Social Needs  . Financial resource strain: Not on file  . Food insecurity:    Worry: Not on file    Inability: Not on file  . Transportation needs:    Medical: Not on file    Non-medical: Not on file  Tobacco Use  . Smoking status: Former Research scientist (life sciences)  . Smokeless tobacco: Never Used  Substance and Sexual Activity  . Alcohol use: Yes    Alcohol/week: 4.2 oz    Types: 7 Standard drinks or equivalent per week    Comment: Drinks at least 1 drink a day of different things  . Drug use: No  . Sexual activity: Yes    Birth control/protection: Pill  Lifestyle  . Physical activity:    Days per week: Not on file    Minutes per session: Not on file  . Stress: Not on file  Relationships  . Social connections:    Talks on phone: Not on file    Gets together: Not on file    Attends religious service: Not on file    Active member of club or organization: Not on file    Attends meetings of clubs or organizations: Not on file    Relationship status: Not on file  . Intimate partner violence:    Fear of current or ex partner: Not on file    Emotionally abused: Not on file    Physically abused: Not on file    Forced sexual  activity: Not on file  Other Topics Concern  . Not on file  Social History Narrative      Marital status:  Single; parents in [place]: none      Employment: [employer] works indoors       Tobacco: none      Alcohol: socially; twice weekly      Drugs: none      Exercise: sporadic; walking sometimes         Daily caffeine    Family History  Problem Relation Age of Onset  . GI problems Sister   . Asthma Sister   . Anxiety disorder Sister   . Heart disease Maternal Grandmother   . Anxiety disorder Mother   . Depression Mother   . Alcohol abuse Maternal Grandfather   . Anxiety disorder Sister   . Cancer Maternal Aunt        breast  . OCD Maternal Aunt   . Colon cancer Neg Hx        Objective:    BP 102/72   Pulse (!) 104   Temp 98.2 F (36.8  C) (Oral)   Resp 16   Wt 147 lb (66.7 kg)   SpO2 100%   BMI 23.62 kg/m  Physical Exam  Constitutional: She is oriented to person, place, and time. She appears well-developed and well-nourished. No distress.  HENT:  Head: Normocephalic and atraumatic.  Right Ear: Hearing, tympanic membrane, external ear and ear canal normal.  Left Ear: Hearing, tympanic membrane, external ear and ear canal normal.  Nose: Nose normal. No mucosal edema or rhinorrhea.  Mouth/Throat: Uvula is midline, oropharynx is clear and moist and mucous membranes are normal. She does not have dentures. No oral lesions. No trismus in the jaw. Normal dentition. No dental abscesses, uvula swelling, lacerations or dental caries. No oropharyngeal exudate. No tonsillar exudate.  Eyes: Pupils are equal, round, and reactive to light. Conjunctivae and EOM are normal.  Neck: Trachea normal and normal range of motion. Neck supple. No spinous process tenderness and no muscular tenderness present. Carotid bruit is not present. No neck rigidity. No edema, no erythema and normal range of motion present. No thyromegaly present.    1 cm mobile slightly tender R  posterior occipital node; no erythema.  Immediately superior to occipital node, mild skin scaling and erythema.   Cardiovascular: Normal rate, regular rhythm, normal heart sounds and intact distal pulses. Exam reveals no gallop and no friction rub.  No murmur heard. Pulmonary/Chest: Effort normal and breath sounds normal. She has no wheezes. She has no rales.  Abdominal: Soft. Bowel sounds are normal. She exhibits no distension and no mass. There is no tenderness. There is no rebound and no guarding.  Lymphadenopathy:    She has cervical adenopathy.  Neurological: She is alert and oriented to person, place, and time. No cranial nerve deficit.  Skin: Skin is warm and dry. No rash noted. She is not diaphoretic. No erythema. No pallor.  Psychiatric: She has a normal mood and affect. Her behavior is normal. Judgment and thought content normal.  Nursing note and vitals reviewed.  No results found. Depression screen [facility] 2/9 03/31/2018 02/27/2018 02/27/2018 02/20/2018 02/17/2018  Decreased Interest 0 0 0 0 0  Down, Depressed, Hopeless 0 0 0 0 0  PHQ - 2 Score 0 0 0 0 0  Altered sleeping - - - - -  Tired, decreased energy - - - - -  Change in appetite - - - - -  Feeling bad or failure about yourself  - - - - -  Trouble concentrating - - - - -  Moving slowly or fidgety/restless - - - - -  Suicidal thoughts - - - - -  PHQ-9 Score - - - - -  Difficult doing work/chores - - - - -  Some recent data might be hidden   Fall Risk  03/31/2018 02/27/2018 02/27/2018 02/20/2018 02/17/2018  Falls in the past year? No No No No No        Assessment & Plan:   1. LAD (lymphadenopathy), posterior cervical   2. Bruising   3. Non-intractable vomiting with nausea, unspecified vomiting type     New onset L posterior occipital lymphadenopathy.  Also patient complaining of increased bruising; obtain CBC.  No other associated symptoms other than healing insect bite along scalp superior to node.  Monitor for the next three  weeks.  RTC for persistent LAD or sooner for acute worsening.  Nausea: mild and onset two weeks ago.  Monitor as well.  Obtain CBC.  If persistent, will warrant further work up.  Suggestive  of gastritis; recommend Zantac 141m bid for two weeks.  Avoid NSAID use.   Orders Placed This Encounter  Procedures  . CBC with Differential/Platelet   No orders of the defined types were placed in this encounter.   No follow-ups on file.   Kristi MElayne Guerin M.D. [facility] [place]  [phone] [phone]

## 2018-04-09 ENCOUNTER — Telehealth: Payer: Self-pay | Admitting: Physician Assistant

## 2018-04-09 NOTE — Telephone Encounter (Signed)
Waiting for ov notes to be close to send pt referral. Thanks  Please advise

## 2018-05-04 DIAGNOSIS — R102 Pelvic and perineal pain: Secondary | ICD-10-CM | POA: Diagnosis not present

## 2018-05-06 ENCOUNTER — Telehealth: Payer: Self-pay

## 2018-05-06 NOTE — Telephone Encounter (Signed)
Copied from [place] [phone]. Topic: Bill or Statement - Patient/Guarantor Inquiry >> May 06, 2018  9:58 AM Vernona Rieger wrote: Patient already spoke with billing about date of service 5/4. She was billed for the labs from [place], she thinks that the diagnostic codes were coded wrong and she keeps getting the run around with billing and wants someone in the office to look into this. She said that billing did send an email to the office on Monday 7/15. Patient would like a call back from the office.  Message to [facility] re: billing

## 2018-05-06 NOTE — Telephone Encounter (Signed)
Copied from [place] [phone]. Topic: Bill or Statement - Patient/Guarantor Inquiry >> May 06, 2018  9:58 AM Vernona Rieger wrote: Patient already spoke with billing about date of service 5/4. She was billed for the labs from [place], she thinks that the diagnostic codes were coded wrong and she keeps getting the run around with billing and wants someone in the office to look into this. She said that billing did send an email to the office on Monday 7/15. Patient would like a call back from the office.  Message sent to [facility] re: billing

## 2018-05-14 ENCOUNTER — Ambulatory Visit: Payer: BLUE CROSS/BLUE SHIELD | Admitting: Physician Assistant

## 2018-05-14 ENCOUNTER — Telehealth: Payer: Self-pay | Admitting: Gastroenterology

## 2018-05-14 DIAGNOSIS — R1031 Right lower quadrant pain: Secondary | ICD-10-CM | POA: Diagnosis not present

## 2018-05-14 NOTE — Telephone Encounter (Signed)
Reports 3 weeks of "pretty much constant nausea" and lower abdominal pain that comes and goes. Describes the pain as moderate to severe. She has seen her GYN. She has a history of endometriosis. Patient wants to be sure this is not GI related before having laparoscopic surgery.

## 2018-05-20 ENCOUNTER — Encounter: Payer: Self-pay | Admitting: Physician Assistant

## 2018-05-20 ENCOUNTER — Ambulatory Visit: Payer: BLUE CROSS/BLUE SHIELD | Admitting: Physician Assistant

## 2018-05-20 VITALS — BP 104/70 | HR 72 | Ht 65.0 in | Wt 150.2 lb

## 2018-05-20 DIAGNOSIS — K900 Celiac disease: Secondary | ICD-10-CM | POA: Diagnosis not present

## 2018-05-20 DIAGNOSIS — R1084 Generalized abdominal pain: Secondary | ICD-10-CM | POA: Diagnosis not present

## 2018-05-20 DIAGNOSIS — R194 Change in bowel habit: Secondary | ICD-10-CM | POA: Diagnosis not present

## 2018-05-20 MED ORDER — NA SULFATE-K SULFATE-MG SULF 17.5-3.13-1.6 GM/177ML PO SOLN: 1.0000 | ORAL | 0 refills | Status: AC

## 2018-05-20 NOTE — Progress Notes (Signed)
Reviewed and agree with management plan.  Kassidee Narciso T. Kristyna Bradstreet, MD FACG 

## 2018-05-20 NOTE — Progress Notes (Signed)
Thank you for sending this case to me. I reviewed the note, will perform procedures as requested, and convey results to Dr. Fuller Plan for ongoing management.  Wilfrid Lund, MD

## 2018-05-20 NOTE — Progress Notes (Signed)
Chief Complaint: Abdominal pain, nausea and rectal bleeding  HPI:    Mary Wyatt is a 32 year old female, who follows with Dr. Fuller Plan, with a past medical history as listed below including anxiety and depression, who was referred to me by Wardell Honour, MD for a complaint of abdominal pain, nausea and rectal bleeding.     05/14/2018 phone call, patient described lower abdominal pain and nausea.  She has seen her OB/GYN and they want to do laparoscopic procedure.  She went to see [place] prior to having this done to make sure it was not GI related.    Today, explains that she is scheduled for another laparoscopic surgery for endometriosis in the middle of August.  Currently experiencing similar pain to what she had 6 years ago prior to her last office laparoscopic surgery when they found a cyst and removed it.  Patient would like to make sure though that GI symptoms are not part of what is going on to make sure "I am not missing anything".  Describes that on 4th of July she started with a stabbing pain on the right side of her abdomen and she felt like she had to go to the bathroom.  She tried to do so for a long time but could never get anything out.  This pain was a 9-10/21/08 at that time.  Since then it has decreased but is a constant gnawing rated as a 4-5/10 which seems to move around her abdomen, sometimes on the right side down to her pelvis and other times on the left and sometimes in the middle.  Associated symptoms include nausea which is almost constant with no vomiting and a dull radiating back pain.  Describes she has had a CT in the past which showed constipation.  Per her gynecologist tried MiraLAX once daily for a week and this made her stools looser, now she feels that she is never "completely empty".  Though tells me that she does typically have a bowel movement every day.    Also describes seeing some bright red blood on the toilet paper last night when wiping and about a month ago.   Describes history of anal fissure, but this time is not having any pain.    Denies fever, chills, weight loss or anorexia, nausea, vomiting, heartburn or reflux.  Previous GI History:  07/29/2017 office visit Dr. Fuller Plan to follow-up on fissure diagnosed 7 months prior, at that time had restarted her diltiazem.  At that time continue on diltiazem 3 times daily. EGD in 2013 by Dr. 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  EGD in 2014 by Dr. Deatra Ina with duodenal biopsies showing an intact villous architecture with increased duodenal lymphocytes, question early sprue. March 2018: Normal tTG IgA of 1 with an IgA of 66 and a normal tTG IgG of 1.  Feb 2014:  Normal tTG IgA of 9.5 with an IgA of 54 and a normal tTG IgG of 12.7.  March 2018: Endomysial IgA and tTG IgG were negative October 2018 HLA DQ testing showed DQ 2+ it was discussed she may have celiac disease but would need an EGD with biopsies as next step would she want to pursue this.  Past Medical History:  Diagnosis Date  . Allergy   . Anemia   . Anxiety   . Chest pain   . Depression   . Genital warts   . GERD (gastroesophageal reflux disease)   .  Interstitial cystitis   . RMSF [facility] spotted fever) 2015  . Ulcer     Past Surgical History:  Procedure Laterality Date  . OVARIAN CYST REMOVAL    . TONSILLECTOMY      Current Outpatient Medications  Medication Sig Dispense Refill  . docusate sodium (COLACE) 100 MG capsule Take 100 mg by mouth 2 (two) times daily as needed for mild constipation.    . Norethin Ace-Eth Estrad-FE 1-20 MG-MCG(24) CHEW TAKE 1 TABLET BY MOUTH EVERY DAY CONTINUOUSLY (SKIPPING PLACEBOS)  12  . triamcinolone cream (KENALOG) 0.1 % Apply 1 application topically 2 (two) times daily. 30 g 1   No current facility-administered medications for this visit.     Allergies as of 05/20/2018 - Review Complete 05/20/2018  Allergen  Reaction Noted  . Diflucan [fluconazole] Hives 04/09/2012    Family History  Problem Relation Age of Onset  . GI problems Sister   . Asthma Sister   . Anxiety disorder Sister   . Heart disease Maternal Grandmother   . Anxiety disorder Mother   . Depression Mother   . Alcohol abuse Maternal Grandfather   . Anxiety disorder Sister   . Cancer Maternal Aunt        breast  . OCD Maternal Aunt   . Colon cancer Neg Hx   . Pancreatic cancer Neg Hx        Not to patient's knowlwdge  . Rectal cancer Neg Hx        Not to patient's knowledge    Social History   Socioeconomic History  . Marital status: Single    Spouse name: Not on file  . Number of children: 0  . Years of education: Not on file  . Highest education level: Not on file  Occupational History  . Occupation: [employer]  Social Needs  . Financial resource strain: Not on file  . Food insecurity:    Worry: Not on file    Inability: Not on file  . Transportation needs:    Medical: Not on file    Non-medical: Not on file  Tobacco Use  . Smoking status: Former Research scientist (life sciences)  . Smokeless tobacco: Never Used  Substance and Sexual Activity  . Alcohol use: Yes    Alcohol/week: 4.2 oz    Types: 7 Standard drinks or equivalent per week    Comment: Drinks at least 1 drink a day of different things  . Drug use: No  . Sexual activity: Yes    Birth control/protection: Pill  Lifestyle  . Physical activity:    Days per week: Not on file    Minutes per session: Not on file  . Stress: Not on file  Relationships  . Social connections:    Talks on phone: Not on file    Gets together: Not on file    Attends religious service: Not on file    Active member of club or organization: Not on file    Attends meetings of clubs or organizations: Not on file    Relationship status: Not on file  . Intimate partner violence:    Fear of current or ex partner: Not on file    Emotionally abused: Not on file    Physically abused: Not on file     Forced sexual activity: Not on file  Other Topics Concern  . Not on file  Social History Narrative      Marital status:  Single; parents in [place]: none  Employment: [employer] works indoors       Tobacco: none      Alcohol: socially; twice weekly      Drugs: none      Exercise: sporadic; walking sometimes         Daily caffeine     Review of Systems:    Constitutional: No weight loss, fever or chills Cardiovascular: No chest pain Respiratory: No SOB Gastrointestinal: See HPI and otherwise negative   Physical Exam:  Vital signs: BP 104/70   Pulse 72   Ht 5' 5"  (1.651 m)   Wt 150 lb 4 oz (68.2 kg)   BMI 25.00 kg/m   Constitutional:   Pleasant Caucasian female appears to be in NAD, Well developed, Well nourished, alert and cooperative Respiratory: Respirations even and unlabored. Lungs clear to auscultation bilaterally.   No wheezes, crackles, or rhonchi.  Cardiovascular: Normal S1, S2. No MRG. Regular rate and rhythm. No peripheral edema, cyanosis or pallor.  Gastrointestinal:  Soft, nondistended, Moderate generalized ttp. No rebound or guarding. Normal bowel sounds. No appreciable masses or hepatomegaly. Psychiatric: Demonstrates good judgement and reason without abnormal affect or behaviors.  RELEVANT LABS AND IMAGING: CBC    Component Value Date/Time   WBC 4.1 03/31/2018 1000   WBC 4.7 10/10/2017 1415   WBC CANCELED 09/10/2016 1358   RBC 4.41 03/31/2018 1000   RBC 4.21 10/10/2017 1415   RBC CANCELED 09/10/2016 1358   HGB 13.8 03/31/2018 1000   HCT 41.1 03/31/2018 1000   PLT 259 03/31/2018 1000   MCV 93 03/31/2018 1000   MCH 31.3 03/31/2018 1000   MCH 31.7 (A) 10/10/2017 1415   MCH CANCELED 09/10/2016 1358   MCHC 33.6 03/31/2018 1000   MCHC 34.0 10/10/2017 1415   MCHC CANCELED 09/10/2016 1358   RDW 12.7 03/31/2018 1000   LYMPHSABS 1.6 03/31/2018 1000   MONOABS CANCELED 09/10/2016 1358   EOSABS 0.1 03/31/2018 1000   BASOSABS 0.0  03/31/2018 1000    CMP     Component Value Date/Time   NA 137 12/23/2017 1626   K 4.1 12/23/2017 1626   CL 103 12/23/2017 1626   CO2 23 12/23/2017 1626   GLUCOSE 115 (H) 12/23/2017 1626   GLUCOSE 68 09/10/2016 1358   BUN 7 12/23/2017 1626   CREATININE 0.61 12/23/2017 1626   CREATININE 0.64 09/10/2016 1358   CALCIUM 9.5 12/23/2017 1626   PROT 7.0 12/23/2017 1626   ALBUMIN 4.6 12/23/2017 1626   AST 14 12/23/2017 1626   ALT 18 12/23/2017 1626   ALKPHOS 46 12/23/2017 1626   BILITOT 0.2 12/23/2017 1626   GFRNONAA 119 12/23/2017 1626   GFRAA 137 12/23/2017 1626    Assessment: 1.  HLA DQ 2+: Per Dr. Fuller Plan recommended EGD with biopsies as the next step to see if she had celiac disease, patient wishes to proceed 2.  Change in bowel habits: Towards constipation off-and-on, no help with MiraLAX, could be contributing to abdominal pain 3.  Abdominal pain: History of endometriosis, it has caused similar pain in the past, patient would like to make sure that she is not missing anything; consider IBS  Plan: 1.  Scheduled patient for diagnostic EGD and colonoscopy with Dr. Loletha Carrow due to schedule availability.  Discussed risk, benefits, limitations and alternatives and the patient agrees to proceed.  Patient will follow with Dr. Fuller Plan going forward after time of procedures. 2.  Recommend patient start Align probiotic.  She should start this after time of procedures.  Take once daily  for 2 to 3 months. 3.   Patient may be moving in September. 4.  Patient to follow in clinic with Dr. Fuller Plan per recommendations from Dr. Loletha Carrow after time of procedures.  Ellouise Newer, PA-C [facility] 05/20/2018, 3:27 PM  Cc: Wardell Honour, MD

## 2018-05-20 NOTE — Patient Instructions (Signed)
Please purchase the following medications over the counter and take as directed: Align probiotic once a day for 2-3 months  You have been scheduled for an endoscopy and colonoscopy. Please follow the written instructions given to you at your visit today. Please pick up your prep supplies at the pharmacy within the next 1-3 days. If you use inhalers (even only as needed), please bring them with you on the day of your procedure. Your physician has requested that you go to www.startemmi.com and enter the access code given to you at your visit today. This web site gives a general overview about your procedure. However, you should still follow specific instructions given to you by our office regarding your preparation for the procedure.

## 2018-05-22 IMAGING — US US PELVIS COMPLETE
1 series · 15 of 25 positions shown · non-contrast
Comparison: 08/03/2015

CLINICAL DATA: Pelvic pain with irregular vaginal bleeding.



[Series 1: us pelvis complete · 15 of 98 slices shown]
[im 1/98]
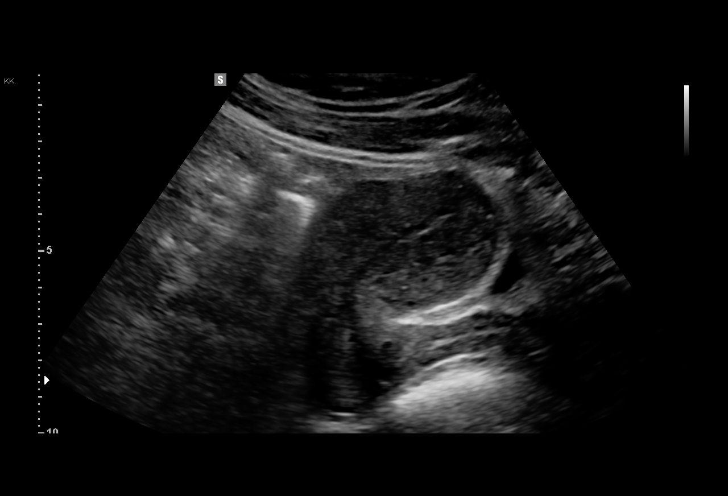
[im 9/98]
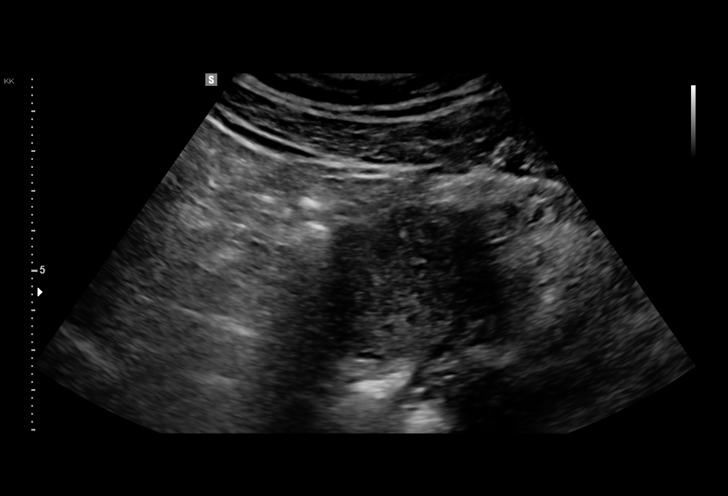
[im 17/98]
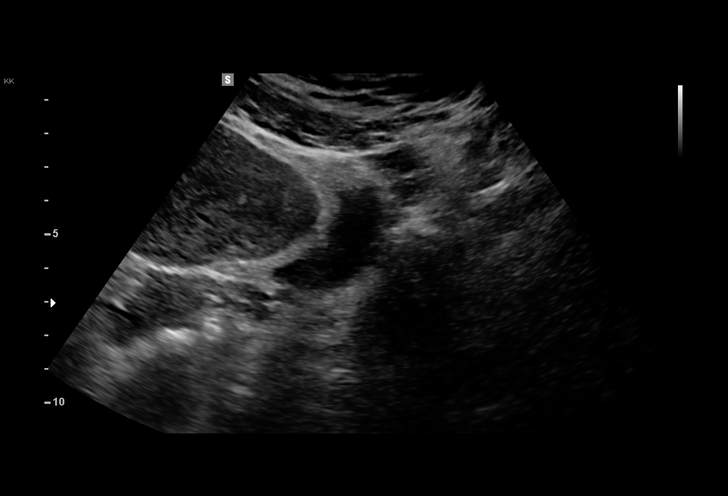
[im 21/98]
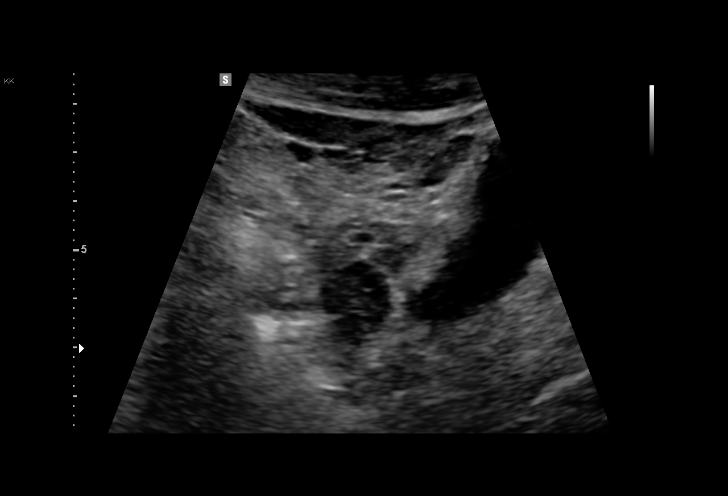
[im 29/98]
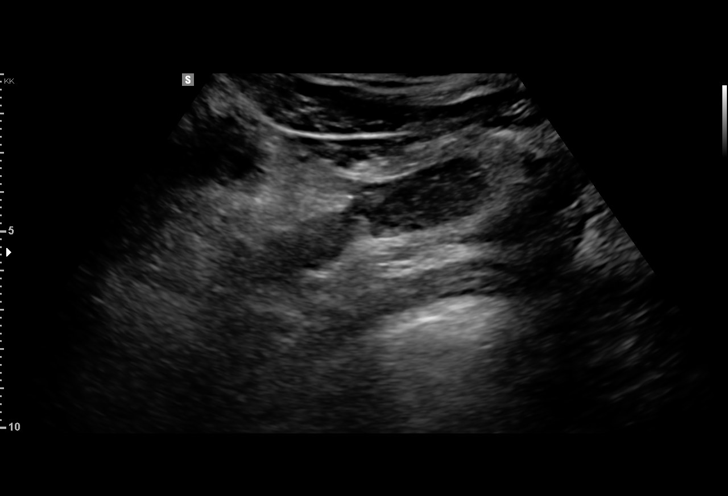
[im 37/98]
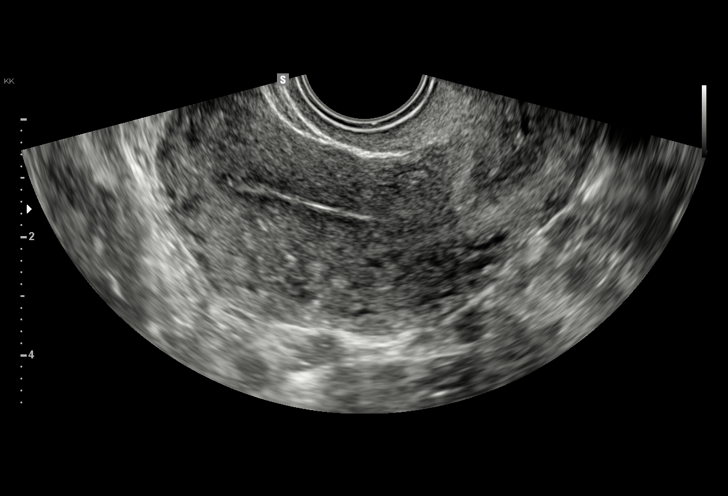
[im 41/98]
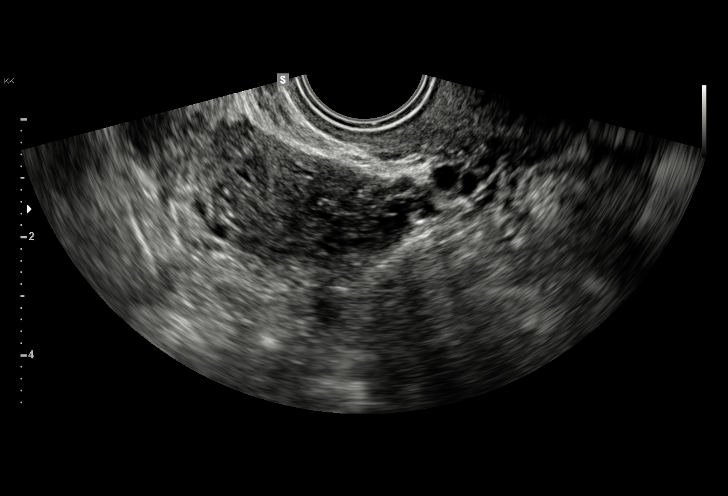
[im 49/98]
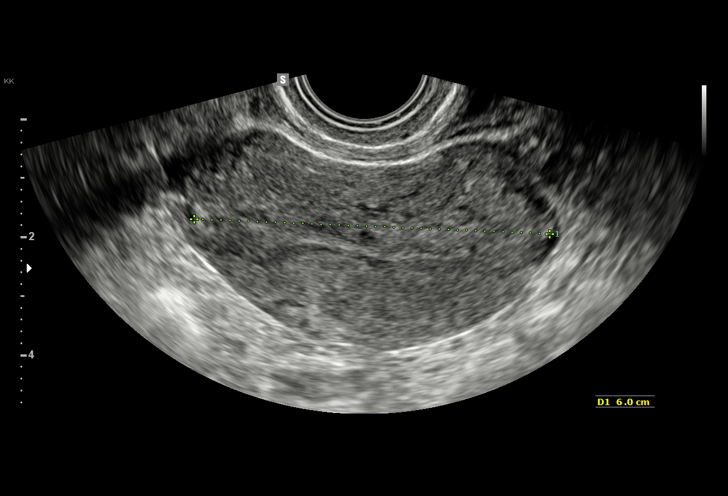
[im 57/98]
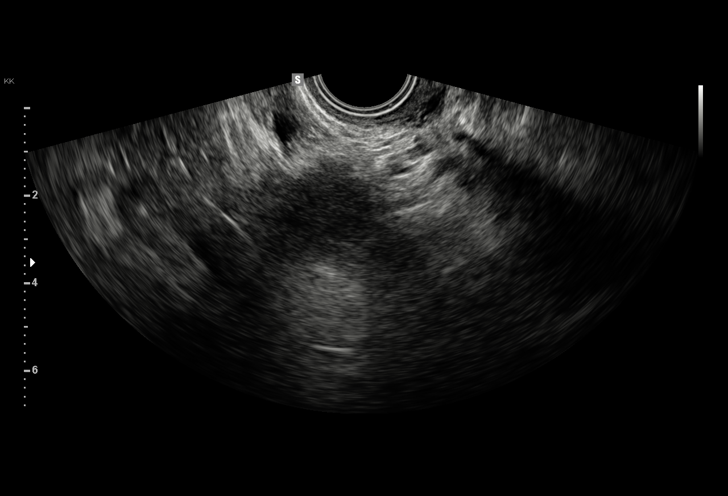
[im 61/98]
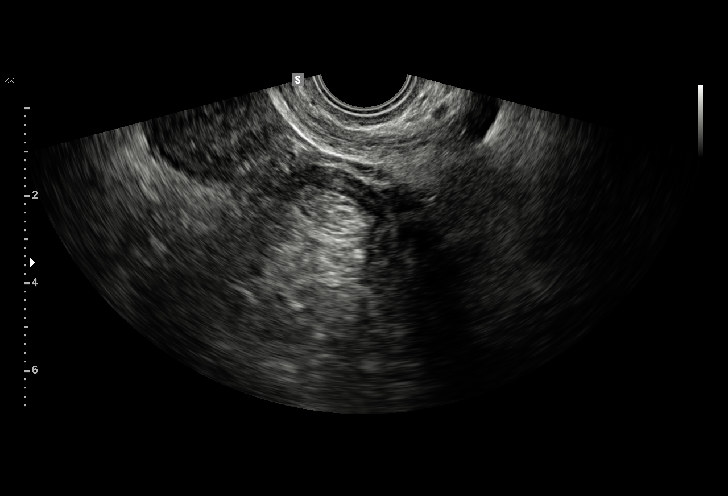
[im 69/98]
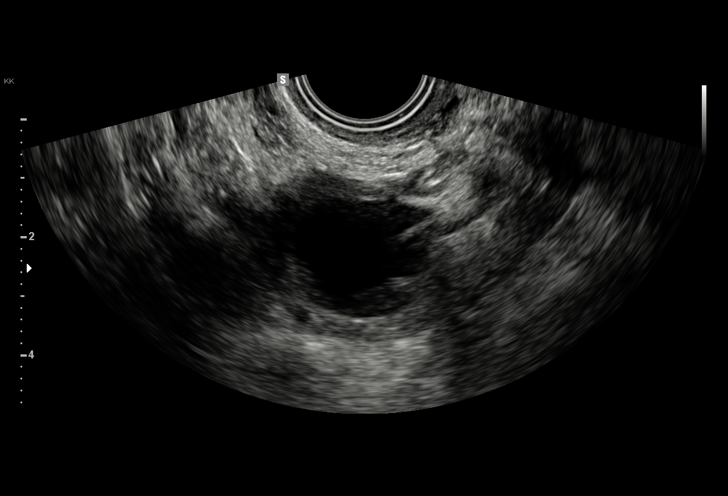
[im 77/98]
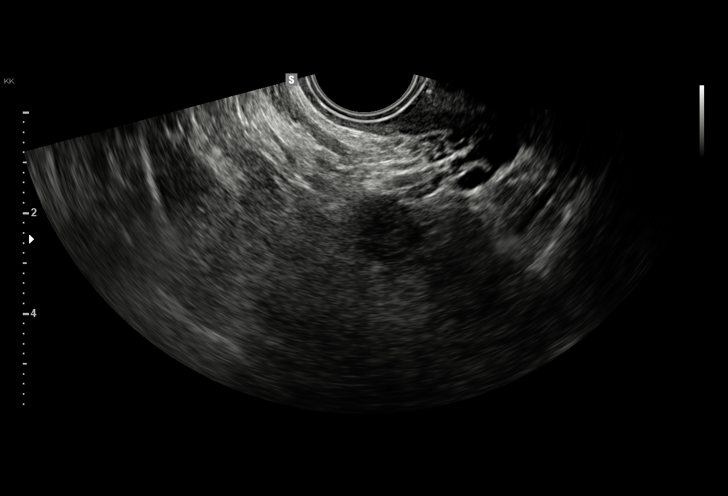
[im 81/98]
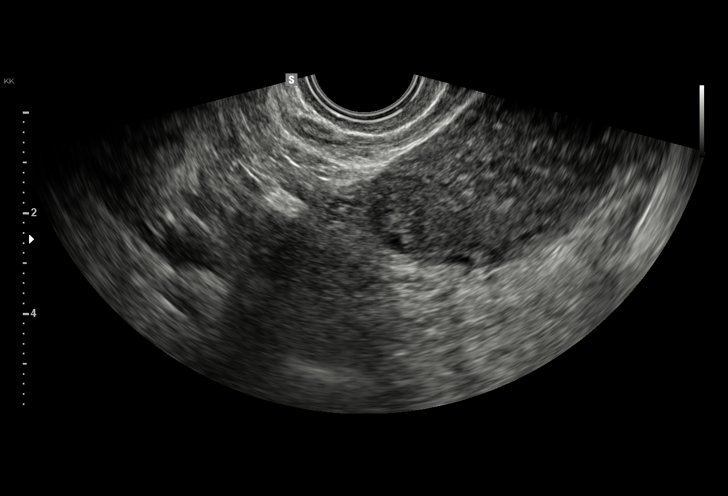
[im 89/98]
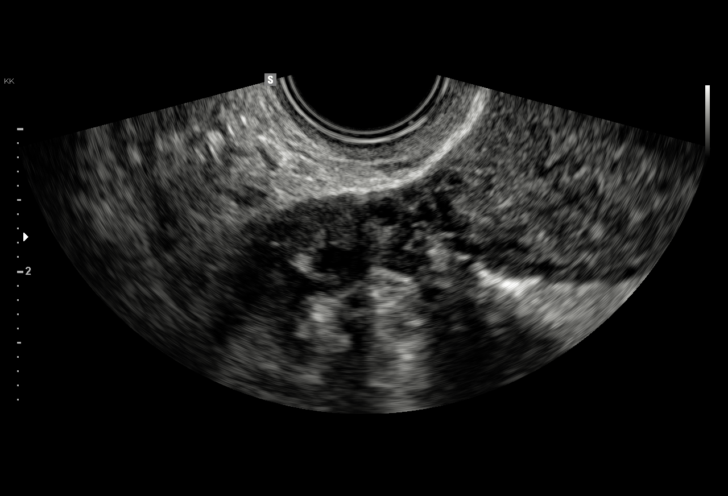
[im 98/98]
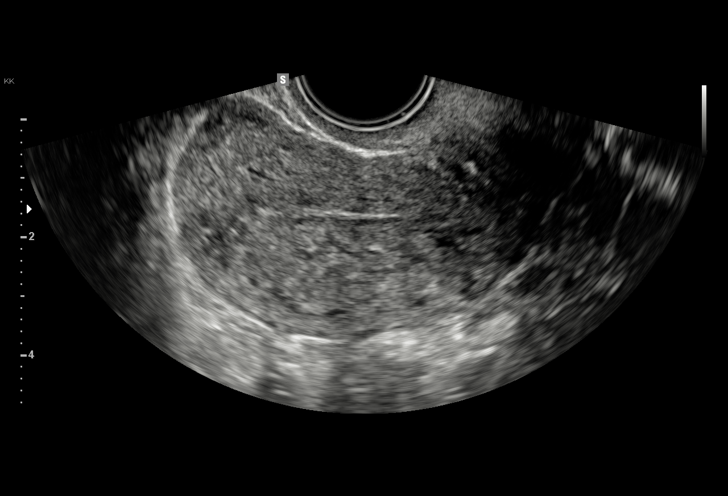

[15 of 25 positions shown; findings below may reference images not displayed]

FINDINGS: Uterus

Measurements: 8.2 x 3.1 x 6.0 cm. No fibroids or other mass
visualized.

Endometrium

Thickness: 2 mm.  No focal abnormality visualized.

Right ovary

Measurements: 2.3 x 1.4 x 1.4 cm. Normal appearance/no adnexal mass.

Left ovary

Measurements: 3.4 x 3.0 x 3.0 cm. Normal appearance/no adnexal mass.

Other findings

No substantial intraperitoneal free fluid.
IMPRESSION: No acute findings. No evidence to explain the history of pain and
irregular menstrual bleeding.

## 2018-05-25 ENCOUNTER — Telehealth: Payer: Self-pay | Admitting: General Practice

## 2018-05-25 NOTE — Telephone Encounter (Signed)
Copied from [place] [id number]. Topic: General - Other >> May 25, 2018 11:18 AM Adelene Idler wrote: Mary Wyatt is calling in stating the need pts last office note , any labs that were recently done, chest xrays and any ekgs to be faxed over ASAP  Fax # [phone]

## 2018-06-01 NOTE — Telephone Encounter (Signed)
Last office note, labs, chest x ray and ekg sent via fax to [phone]. Dgaddy, [name]

## 2018-06-02 ENCOUNTER — Ambulatory Visit: Payer: BLUE CROSS/BLUE SHIELD | Admitting: Clinical

## 2018-06-02 DIAGNOSIS — N803 Endometriosis of pelvic peritoneum: Secondary | ICD-10-CM | POA: Diagnosis not present

## 2018-06-02 DIAGNOSIS — D252 Subserosal leiomyoma of uterus: Secondary | ICD-10-CM | POA: Diagnosis not present

## 2018-06-02 DIAGNOSIS — R102 Pelvic and perineal pain: Secondary | ICD-10-CM | POA: Diagnosis not present

## 2018-06-02 DIAGNOSIS — N736 Female pelvic peritoneal adhesions (postinfective): Secondary | ICD-10-CM | POA: Diagnosis not present

## 2018-06-09 DIAGNOSIS — Z6824 Body mass index (BMI) 24.0-24.9, adult: Secondary | ICD-10-CM | POA: Diagnosis not present

## 2018-06-09 DIAGNOSIS — Z01419 Encounter for gynecological examination (general) (routine) without abnormal findings: Secondary | ICD-10-CM | POA: Diagnosis not present

## 2018-06-15 ENCOUNTER — Ambulatory Visit (INDEPENDENT_AMBULATORY_CARE_PROVIDER_SITE_OTHER): Payer: BLUE CROSS/BLUE SHIELD | Admitting: Clinical

## 2018-06-15 DIAGNOSIS — F411 Generalized anxiety disorder: Secondary | ICD-10-CM | POA: Diagnosis not present

## 2018-06-18 ENCOUNTER — Encounter: Payer: Self-pay | Admitting: Gastroenterology

## 2018-06-18 ENCOUNTER — Ambulatory Visit: Payer: BLUE CROSS/BLUE SHIELD | Admitting: Physician Assistant

## 2018-06-18 ENCOUNTER — Other Ambulatory Visit: Payer: Self-pay

## 2018-06-18 ENCOUNTER — Encounter: Payer: Self-pay | Admitting: Physician Assistant

## 2018-06-18 VITALS — BP 106/67 | HR 62 | Temp 98.3°F | Resp 16 | Ht 65.0 in | Wt 150.8 lb

## 2018-06-18 DIAGNOSIS — R339 Retention of urine, unspecified: Secondary | ICD-10-CM | POA: Diagnosis not present

## 2018-06-18 DIAGNOSIS — R109 Unspecified abdominal pain: Secondary | ICD-10-CM | POA: Diagnosis not present

## 2018-06-18 LAB — POCT URINALYSIS DIP (MANUAL ENTRY)
Bilirubin, UA: NEGATIVE
Blood, UA: NEGATIVE
Glucose, UA: NEGATIVE mg/dL
Ketones, POC UA: NEGATIVE mg/dL
Leukocytes, UA: NEGATIVE
Nitrite, UA: NEGATIVE
Protein Ur, POC: NEGATIVE mg/dL
Spec Grav, UA: 1.025 (ref 1.010–1.025)
Urobilinogen, UA: 0.2 E.U./dL
pH, UA: 7 (ref 5.0–8.0)

## 2018-06-18 LAB — POC MICROSCOPIC URINALYSIS (UMFC): Mucus: ABSENT

## 2018-06-18 MED ORDER — DICLOFENAC SODIUM 1 % TD GEL: 2.0000 g | Freq: Four times a day (QID) | TRANSDERMAL | 1 refills | Status: DC

## 2018-06-18 NOTE — Progress Notes (Signed)
Mary Wyatt  MRN: [id number] DOB: 04-29-1986  PCP: Patient, No Pcp Per  Subjective:  Pt is a pleasant 32 year old female who presents to clinic for urinary symptoms.   Recent h/o laproscopic surgery for endometriosis 06/02/2018. She "did a round of sulfa antibiotics" for dysuria a few days after surgery.  Rx from GYN. This improved symptoms of urinary hesitancy.   Today she endorses "my urethra hurts and burns all the time". She cannot tell when she needs to urinate, "I just gave that urine sample and I didn't realize I needed to go so badly". Occasional hematuria. Sometimes she thinks she's finished urinating then she "dribbles on the toilet seat".  Denies consistent blood, burning with urination, flank pain, fever, chills.   Endorses tenderness of her abdomen below her belly button. She did not have pain after surgery. This pain started a few days ago. Hurts to push on the area. Pain can be 8/10. Denies skin changes, swelling, warmth, drainage.   Review of Systems  Gastrointestinal: Positive for abdominal pain. Negative for constipation, diarrhea, nausea and vomiting.  Genitourinary: Positive for decreased urine volume, difficulty urinating and hematuria. Negative for flank pain, frequency, pelvic pain, urgency, vaginal bleeding, vaginal discharge and vaginal pain.  Skin: Negative.     Patient Active Problem List   Diagnosis Date Noted  . Anxiety and depression 06/15/2017  . Family history of brain aneurysm 04/19/2017  . Rhinitis, allergic 04/20/2014  . Generalized anxiety disorder 04/20/2014  . Celiac disease 12/15/2012  . Interstitial cystitis     Current Outpatient Medications on File Prior to Visit  Medication Sig Dispense Refill  . docusate sodium (COLACE) 100 MG capsule Take 100 mg by mouth 2 (two) times daily as needed for mild constipation.    . Norethin Ace-Eth Estrad-FE 1-20 MG-MCG(24) CHEW TAKE 1 TABLET BY MOUTH EVERY DAY CONTINUOUSLY (SKIPPING PLACEBOS)  12    . triamcinolone cream (KENALOG) 0.1 % Apply 1 application topically 2 (two) times daily. 30 g 1  . Na Sulfate-K Sulfate-Mg Sulf 17.5-3.13-1.6 GM/177ML SOLN Take 1 kit by mouth as directed. (Patient not taking: Reported on 06/18/2018) 354 mL 0   No current facility-administered medications on file prior to visit.     Allergies  Allergen Reactions  . Diflucan [Fluconazole] Hives     Objective:  BP 106/67 (BP Location: Right Arm, Patient Position: Sitting, Cuff Size: Normal)   Pulse 62   Temp 98.3 F (36.8 C) (Oral)   Resp 16   Ht _0  (1.651 m)   Wt 150 lb 12.8 oz (68.4 kg)   LMP 05/24/2018   SpO2 99%   BMI 25.09 kg/m   Physical Exam  Constitutional: She is oriented to person, place, and time. No distress.  Abdominal:    Neurological: She is alert and oriented to person, place, and time.  Skin: Skin is warm and dry.  Psychiatric: Judgment normal.  Vitals reviewed.   Results for orders placed or performed in visit on 06/18/18  POCT urinalysis dipstick  Result Value Ref Range   Color, UA yellow yellow   Clarity, UA clear clear   Glucose, UA negative negative mg/dL   Bilirubin, UA negative negative   Ketones, POC UA negative negative mg/dL   Spec Grav, UA 1.025 1.010 - 1.025   Blood, UA negative negative   pH, UA 7.0 5.0 - 8.0   Protein Ur, POC negative negative mg/dL   Urobilinogen, UA 0.2 0.2 or 1.0 E.U./dL   Nitrite,  UA Negative Negative   Leukocytes, UA Negative Negative  POCT Microscopic Urinalysis ([facility])  Result Value Ref Range   WBC,UR,HPF,POC None None WBC/hpf   RBC,UR,HPF,POC None None RBC/hpf   Bacteria None None, Too numerous to count   Mucus Absent Absent   Epithelial Cells, UR Per Microscopy None None, Too numerous to count cells/hpf    Assessment and Plan :  1. Incomplete emptying of bladder - Pt presents for neurologic complaints related to voiding. S/p laproscopic surgery for endometriosis 06/02/2018. Negative UA, no hematuria. Plan to refer to  urology for evaluation and pelvic health PT for possible therapy. She agrees with plan.  - POCT urinalysis dipstick - POCT Microscopic Urinalysis ([facility]) - Urine Culture - Ambulatory referral to Physical Therapy - Ambulatory referral to Urology  2. Abdominal pain, unspecified abdominal location - no sign of infection around incision site. Pain is consistent with nerve damage. Plan to have eval from PT.  - diclofenac sodium (VOLTAREN) 1 % GEL; Apply 2 g topically 4 (four) times daily.  Dispense: 1 Tube; Refill: 1   Whitney Camry Robello, PA-C  [facility] 06/18/2018 10:57 AM  Please note: Portions of this report may have been transcribed using dragon voice recognition software. Every effort was made to ensure accuracy; however, inadvertent computerized transcription errors may be present.

## 2018-06-18 NOTE — Patient Instructions (Addendum)
Try: Ibuprofen.  B vitamins are good for nerve health.  Voltaren gel applied topically  Ice/heat Gentle massage  You will receive a phone call to schedule an appointment with urology and pelvic health.  (let me know if you haven't heard from our referral team by 2 weeks)  Thank you for coming in today. I hope you feel we met your needs.  Feel free to call PCP if you have any questions or further requests.  Please consider signing up for MyChart if you do not already have it, as this is a great way to communicate with me.  Best,  Whitney McVey, PA-C   IF you received an x-ray today, you will receive an invoice from [facility]. Please contact [facility] at [phone] with questions or concerns regarding your invoice.   IF you received labwork today, you will receive an invoice from [facility]. Please contact [facility] at [phone] with questions or concerns regarding your invoice.   Our billing staff will not be able to assist you with questions regarding bills from these companies.  You will be contacted with the lab results as soon as they are available. The fastest way to get your results is to activate your My Chart account. Instructions are located on the last page of this paperwork. If you have not heard from [place] regarding the results in 2 weeks, please contact this office.

## 2018-06-20 LAB — URINE CULTURE

## 2018-06-23 ENCOUNTER — Telehealth: Payer: Self-pay | Admitting: *Deleted

## 2018-06-23 NOTE — Telephone Encounter (Signed)
Pa started [id number]  key

## 2018-06-28 ENCOUNTER — Ambulatory Visit: Payer: BLUE CROSS/BLUE SHIELD | Admitting: Physician Assistant

## 2018-06-28 VITALS — BP 123/69 | HR 83 | Temp 98.2°F | Resp 16 | Ht 65.0 in | Wt 151.0 lb

## 2018-06-28 DIAGNOSIS — R21 Rash and other nonspecific skin eruption: Secondary | ICD-10-CM

## 2018-06-28 LAB — POCT SKIN KOH: Skin KOH, POC: NEGATIVE

## 2018-06-28 MED ORDER — PREDNISONE 20 MG PO TABS: ORAL_TABLET | ORAL | 0 refills | Status: DC

## 2018-06-28 NOTE — Patient Instructions (Addendum)
  Start taking prednisone for your rash.  Come back and see me in 1-2 months. Just to check back in.  Thank you for coming in today. I hope you feel we met your needs.  Feel free to call PCP if you have any questions or further requests.  Please consider signing up for MyChart if you do not already have it, as this is a great way to communicate with me.  Best,  Whitney McVey, PA-C   IF you received an x-ray today, you will receive an invoice from [facility]. Please contact [facility] at [phone] with questions or concerns regarding your invoice.   IF you received labwork today, you will receive an invoice from [facility]. Please contact [facility] at [phone] with questions or concerns regarding your invoice.   Our billing staff will not be able to assist you with questions regarding bills from these companies.  You will be contacted with the lab results as soon as they are available. The fastest way to get your results is to activate your My Chart account. Instructions are located on the last page of this paperwork. If you have not heard from [place] regarding the results in 2 weeks, please contact this office.    ]\

## 2018-06-28 NOTE — Progress Notes (Signed)
   Mary Wyatt  MRN: [id number] DOB: 03/04/1986  PCP: Patient, No Pcp Per  Subjective:  Pt is a 32 year old female who presents to clinic for rash on both arms for 1 week. She endorses a spot on her upper inner left arm that "breaks out" every once in a while. She was treated for this rash a few months ago with Kenalog 0.1%, which "didn't help it at all". That rash went away on it's own The rash is worse today, involving the front of her left upper and lower arm and her right forearm. Endorses itching.  She was outside under an umbrella last weekend. No other potential exposures.   Review of Systems  Constitutional: Negative for chills, diaphoresis, fatigue and fever.  Respiratory: Negative for cough, chest tightness and shortness of breath.   Gastrointestinal: Negative for abdominal pain, diarrhea, nausea and vomiting.  Skin: Positive for rash.    Patient Active Problem List   Diagnosis Date Noted  . Anxiety and depression 06/15/2017  . Family history of brain aneurysm 04/19/2017  . Rhinitis, allergic 04/20/2014  . Generalized anxiety disorder 04/20/2014  . Celiac disease 12/15/2012  . Interstitial cystitis     Current Outpatient Medications on File Prior to Visit  Medication Sig Dispense Refill  . docusate sodium (COLACE) 100 MG capsule Take 100 mg by mouth 2 (two) times daily as needed for mild constipation.    . Norethin Ace-Eth Estrad-FE 1-20 MG-MCG(24) CHEW TAKE 1 TABLET BY MOUTH EVERY DAY CONTINUOUSLY (SKIPPING PLACEBOS)  12  . triamcinolone cream (KENALOG) 0.1 % Apply 1 application topically 2 (two) times daily. 30 g 1  . diclofenac sodium (VOLTAREN) 1 % GEL Apply 2 g topically 4 (four) times daily. (Patient not taking: Reported on 06/28/2018) 1 Tube 1   No current facility-administered medications on file prior to visit.     Allergies  Allergen Reactions  . Diflucan [Fluconazole] Hives     Objective:  BP 123/69   Pulse 83   Temp 98.2 F (36.8 C) (Oral)    Resp 16   Ht 5' 5"  (1.651 m)   Wt 151 lb (68.5 kg)   SpO2 100%   BMI 25.13 kg/m   Physical Exam  Constitutional: She is oriented to person, place, and time. No distress.  Neurological: She is alert and oriented to person, place, and time.  Skin: Skin is warm and dry. Rash noted. Rash is maculopapular and vesicular.     Psychiatric: Judgment normal.  Vitals reviewed.  Results for orders placed or performed in visit on 06/28/18  POCT Skin KOH  Result Value Ref Range   Skin KOH, POC Negative Negative    Assessment and Plan :  1. Rash and nonspecific skin eruption -Patient presents complaining of worsening rash of bilateral arms.  KOH is negative.  Discussed with patient try to notice if any association with heat, clothing, exposure, food.  We will try prednisone.  Return to clinic if no improvement. - POCT Skin KOH - predniSONE (DELTASONE) 20 MG tablet; Take 3 PO QAM x2days, 2 PO QAM x2days, 1 PO QAM x2days  Dispense: 12 tablet; Refill: 0    Whitney Ximena Todaro, PA-C  [facility] [place] 06/28/2018 2:06 PM  Please note: Portions of this report may have been transcribed using dragon voice recognition software. Every effort was made to ensure accuracy; however, inadvertent computerized transcription errors may be present.

## 2018-06-30 ENCOUNTER — Telehealth: Payer: Self-pay | Admitting: Physician Assistant

## 2018-06-30 NOTE — Telephone Encounter (Signed)
Patient was denied their medication (Diclofenac Sodium 1% TD Gel) by their insurance company. Denial letter will be in providers box at nurses station at 102.

## 2018-07-01 DIAGNOSIS — R32 Unspecified urinary incontinence: Secondary | ICD-10-CM | POA: Diagnosis not present

## 2018-07-01 DIAGNOSIS — L905 Scar conditions and fibrosis of skin: Secondary | ICD-10-CM | POA: Diagnosis not present

## 2018-07-01 DIAGNOSIS — R531 Weakness: Secondary | ICD-10-CM | POA: Diagnosis not present

## 2018-07-02 ENCOUNTER — Encounter: Payer: Self-pay | Admitting: Gastroenterology

## 2018-07-02 ENCOUNTER — Ambulatory Visit (AMBULATORY_SURGERY_CENTER): Payer: BLUE CROSS/BLUE SHIELD | Admitting: Gastroenterology

## 2018-07-02 VITALS — BP 150/67 | HR 76 | Temp 99.3°F | Resp 18 | Ht 65.0 in | Wt 150.0 lb

## 2018-07-02 DIAGNOSIS — R1031 Right lower quadrant pain: Secondary | ICD-10-CM | POA: Diagnosis not present

## 2018-07-02 DIAGNOSIS — K900 Celiac disease: Secondary | ICD-10-CM

## 2018-07-02 DIAGNOSIS — K3189 Other diseases of stomach and duodenum: Secondary | ICD-10-CM | POA: Diagnosis not present

## 2018-07-02 DIAGNOSIS — R194 Change in bowel habit: Secondary | ICD-10-CM | POA: Diagnosis not present

## 2018-07-02 MED ORDER — SODIUM CHLORIDE 0.9 % IV SOLN: 500.0000 mL | Freq: Once | INTRAVENOUS | Status: DC

## 2018-07-02 NOTE — Op Note (Signed)
[place] Patient Name: Raiden Yearwood Procedure Date: 07/02/2018 2:57 PM MRN: [id number] Endoscopist: Mallie Mussel L. Loletha Carrow , MD Age: 32 Referring MD:  Date of Birth: Mar 30, 1986 Gender: Female Account #: [id number] Procedure:                Upper GI endoscopy Indications:              Positive celiac serologies Medicines:                Monitored Anesthesia Care Procedure:                Pre-Anesthesia Assessment:                           - Prior to the procedure, a History and Physical                            was performed, and patient medications and                            allergies were reviewed. The patient's tolerance of                            previous anesthesia was also reviewed. The risks                            and benefits of the procedure and the sedation                            options and risks were discussed with the patient.                            All questions were answered, and informed consent                            was obtained. Prior Anticoagulants: The patient has                            taken no previous anticoagulant or antiplatelet                            agents. ASA Grade Assessment: I - A normal, healthy                            patient. After reviewing the risks and benefits,                            the patient was deemed in satisfactory condition to                            undergo the procedure.                           After obtaining informed consent, the endoscope was  passed under direct vision. Throughout the                            procedure, the patient's blood pressure, pulse, and                            oxygen saturations were monitored continuously. The                            Endoscope was introduced through the mouth, and                            advanced to the second part of duodenum. The upper                            GI endoscopy was accomplished without  difficulty.                            The patient tolerated the procedure well. Scope In: Scope Out: Findings:                 The esophagus was normal.                           The stomach was normal.                           The cardia and gastric fundus were normal on                            retroflexion.                           Patchy, mild scalloped mucosa was found in the                            second portion of the duodenum. Six biopsies for                            histology were taken with a cold forceps for                            evaluation of celiac disease.                           The exam of the duodenum was otherwise normal. Complications:            No immediate complications. Estimated Blood Loss:     Estimated blood loss was minimal. Impression:               - Normal esophagus.                           - Normal stomach.                           - Scalloped mucosa was  found in the duodenum, rule                            out celiac disease. Biopsied. Recommendation:           - Patient has a contact number available for                            emergencies. The signs and symptoms of potential                            delayed complications were discussed with the                            patient. Return to normal activities tomorrow.                            Written discharge instructions were provided to the                            patient.                           - Resume previous diet.                           - Continue present medications.                           - Await pathology results.                           - See the other procedure note for documentation of                            additional recommendations. Henry L. Loletha Carrow, MD 07/02/2018 3:36:46 PM This report has been signed electronically.

## 2018-07-02 NOTE — Patient Instructions (Signed)
YOU HAD AN ENDOSCOPIC PROCEDURE TODAY AT [facility]:   Refer to the procedure report that was given to you for any specific questions about what was found during the examination.  If the procedure report does not answer your questions, please call your gastroenterologist to clarify.  If you requested that your care partner not be given the details of your procedure findings, then the procedure report has been included in a sealed envelope for you to review at your convenience later.  YOU SHOULD EXPECT: Some feelings of bloating in the abdomen. Passage of more gas than usual.  Walking can help get rid of the air that was put into your GI tract during the procedure and reduce the bloating. If you had a lower endoscopy (such as a colonoscopy or flexible sigmoidoscopy) you may notice spotting of blood in your stool or on the toilet paper. If you underwent a bowel prep for your procedure, you may not have a normal bowel movement for a few days.  Please Note:  You might notice some irritation and congestion in your nose or some drainage.  This is from the oxygen used during your procedure.  There is no need for concern and it should clear up in a day or so.  SYMPTOMS TO REPORT IMMEDIATELY:   Following lower endoscopy (colonoscopy or flexible sigmoidoscopy):  Excessive amounts of blood in the stool  Significant tenderness or worsening of abdominal pains  Swelling of the abdomen that is new, acute  Fever of 100F or higher   Following upper endoscopy (EGD)  Vomiting of blood or coffee ground material  New chest pain or pain under the shoulder blades  Painful or persistently difficult swallowing  New shortness of breath  Fever of 100F or higher  Black, tarry-looking stools  For urgent or emergent issues, a gastroenterologist can be reached at any hour by calling [phone].   DIET:  We do recommend a small meal at first, but then you may proceed to your regular diet.  Drink  plenty of fluids but you should avoid alcoholic beverages for 24 hours.  MEDICATIONS: Continue present medications.  Please see handouts given to you by your recovery nurse.  ACTIVITY:  You should plan to take it easy for the rest of today and you should NOT DRIVE or use heavy machinery until tomorrow (because of the sedation medicines used during the test).    FOLLOW UP: Our staff will call the number listed on your records the next business day following your procedure to check on you and address any questions or concerns that you may have regarding the information given to you following your procedure. If we do not reach you, we will leave a message.  However, if you are feeling well and you are not experiencing any problems, there is no need to return our call.  We will assume that you have returned to your regular daily activities without incident.  If any biopsies were taken you will be contacted by phone or by letter within the next 1-3 weeks.  Please call us at [phone] if you have not heard about the biopsies in 3 weeks.   Thank you for allowing [place] to provide for your healthcare needs today.   SIGNATURES/CONFIDENTIALITY: You and/or your care partner have signed paperwork which will be entered into your electronic medical record.  These signatures attest to the fact that that the information above on your After Visit Summary has been reviewed and is  understood.  Full responsibility of the confidentiality of this discharge information lies with you and/or your care-partner.

## 2018-07-02 NOTE — Op Note (Signed)
[place] Patient Name: Mary Wyatt Procedure Date: 07/02/2018 2:56 PM MRN: [id number] Endoscopist: Mallie Mussel L. Loletha Carrow , MD Age: 32 Referring MD:  Date of Birth: 04-27-1986 Gender: Female Account #: [id number] Procedure:                Colonoscopy Indications:              Abdominal pain in the right lower quadrant Medicines:                Monitored Anesthesia Care Procedure:                Pre-Anesthesia Assessment:                           - Prior to the procedure, a History and Physical                            was performed, and patient medications and                            allergies were reviewed. The patient's tolerance of                            previous anesthesia was also reviewed. The risks                            and benefits of the procedure and the sedation                            options and risks were discussed with the patient.                            All questions were answered, and informed consent                            was obtained. Prior Anticoagulants: The patient has                            taken no previous anticoagulant or antiplatelet                            agents. ASA Grade Assessment: I - A normal, healthy                            patient. After reviewing the risks and benefits,                            the patient was deemed in satisfactory condition to                            undergo the procedure.                           After obtaining informed consent, the colonoscope  was passed under direct vision. Throughout the                            procedure, the patient's blood pressure, pulse, and                            oxygen saturations were monitored continuously. The                            Colonoscope was introduced through the anus and                            advanced to the the terminal ileum, with                            identification of the appendiceal  orifice and IC                            valve. The colonoscopy was performed without                            difficulty. The patient tolerated the procedure                            well. The quality of the bowel preparation was                            excellent. The terminal ileum, ileocecal valve,                            appendiceal orifice, and rectum were photographed.                            The quality of the bowel preparation was evaluated                            using the BBPS [facility] Bowel Preparation Scale)                            with scores of: Right Colon = 3, Transverse Colon =                            3 and Left Colon = 3 (entire mucosa seen well with                            no residual staining, small fragments of stool or                            opaque liquid). The total BBPS score equals 9. Scope In: 3:20:02 PM Scope Out: 3:29:16 PM Scope Withdrawal Time: 0 hours 6 minutes 47 seconds  Total Procedure Duration: 0 hours 9 minutes 14 seconds  Findings:  The perianal and digital rectal examinations were                            normal.                           The terminal ileum appeared normal.                           The entire examined colon appeared normal on direct                            and retroflexion views. Complications:            No immediate complications. Estimated Blood Loss:     Estimated blood loss: none. Impression:               - The examined portion of the ileum was normal.                           - The entire examined colon is normal on direct and                            retroflexion views.                           - No specimens collected.                           No lower digestive cause of pain, which appears to                            be explained by recent laparoscopy finding of                            endometriosis. Recommendation:           - Patient has a contact number  available for                            emergencies. The signs and symptoms of potential                            delayed complications were discussed with the                            patient. Return to normal activities tomorrow.                            Written discharge instructions were provided to the                            patient.                           - Resume previous diet.                           -  Continue present medications.                           - No recommendation at this time regarding repeat                            colonoscopy due to young age.  L. Loletha Carrow, MD 07/02/2018 3:41:25 PM This report has been signed electronically.

## 2018-07-02 NOTE — Progress Notes (Signed)
Called to room to assist during endoscopic procedure.  Patient ID and intended procedure confirmed with present staff. Received instructions for my participation in the procedure from the performing physician.  

## 2018-07-02 NOTE — Progress Notes (Signed)
Report given to PACU, vss 

## 2018-07-05 ENCOUNTER — Ambulatory Visit (INDEPENDENT_AMBULATORY_CARE_PROVIDER_SITE_OTHER): Payer: BLUE CROSS/BLUE SHIELD | Admitting: Clinical

## 2018-07-05 ENCOUNTER — Telehealth: Payer: Self-pay | Admitting: *Deleted

## 2018-07-05 DIAGNOSIS — F411 Generalized anxiety disorder: Secondary | ICD-10-CM | POA: Diagnosis not present

## 2018-07-05 NOTE — Telephone Encounter (Signed)
  Follow up Call-  Call back number 07/02/2018  Post procedure Call Back phone  # [phone]  Permission to leave phone message Yes  Some recent data might be hidden     Patient questions:  Do you have a fever, pain , or abdominal swelling? No. Pain Score  0 *  Have you tolerated food without any problems? Yes.    Have you been able to return to your normal activities? Yes.    Do you have any questions about your discharge instructions: Diet   No. Medications  No. Follow up visit  No.  Do you have questions or concerns about your Care? No.  Actions: * If pain score is 4 or above: No action needed, pain <4.

## 2018-07-06 DIAGNOSIS — L905 Scar conditions and fibrosis of skin: Secondary | ICD-10-CM | POA: Diagnosis not present

## 2018-07-06 DIAGNOSIS — R32 Unspecified urinary incontinence: Secondary | ICD-10-CM | POA: Diagnosis not present

## 2018-07-06 DIAGNOSIS — R531 Weakness: Secondary | ICD-10-CM | POA: Diagnosis not present

## 2018-07-07 DIAGNOSIS — L309 Dermatitis, unspecified: Secondary | ICD-10-CM | POA: Diagnosis not present

## 2018-07-07 DIAGNOSIS — L719 Rosacea, unspecified: Secondary | ICD-10-CM | POA: Diagnosis not present

## 2018-07-13 ENCOUNTER — Other Ambulatory Visit: Payer: Self-pay

## 2018-07-13 DIAGNOSIS — R531 Weakness: Secondary | ICD-10-CM | POA: Diagnosis not present

## 2018-07-13 DIAGNOSIS — L905 Scar conditions and fibrosis of skin: Secondary | ICD-10-CM | POA: Diagnosis not present

## 2018-07-13 DIAGNOSIS — K900 Celiac disease: Secondary | ICD-10-CM

## 2018-07-13 DIAGNOSIS — R32 Unspecified urinary incontinence: Secondary | ICD-10-CM | POA: Diagnosis not present

## 2018-07-14 ENCOUNTER — Encounter: Payer: BLUE CROSS/BLUE SHIELD | Attending: Gastroenterology | Admitting: Skilled Nursing Facility1

## 2018-07-14 ENCOUNTER — Encounter: Payer: Self-pay | Admitting: Skilled Nursing Facility1

## 2018-07-14 DIAGNOSIS — K900 Celiac disease: Secondary | ICD-10-CM

## 2018-07-14 DIAGNOSIS — Z713 Dietary counseling and surveillance: Secondary | ICD-10-CM | POA: Insufficient documentation

## 2018-07-14 NOTE — Progress Notes (Signed)
  Assessment:  Primary concerns today: celiac.   Pt states he was told she has celiac disease diagnosed through endoscopies but states she does not believe she does. Pt states her endometritis was causing constipation fixed with a laparoscopy in August. Pt states she gets bloated oftena dn has stopped wearing real pants for years and stays low energy all the time. Pt states she lives alone.  Pt states she si really bummed she has celiac disease and really just wants to be able to eat regular bread and not worry about it. Pt states sometimes she just wishes she would have more intense symptoms to make her feel like she really has celiac disease.  After dietitian connected her bloat and low energy to celiac pt felt a little better about believing she has celiac.    MEDICATIONS: see list   DIETARY INTAKE:  Usual eating pattern includes 3 meals and 2 snacks per day.  Everyday foods include none stated.  Avoided foods include none stated.    24-hr recall:  B ( AM): oatmeal with tea Snk ( AM):  L ( PM): black bean tacos with vegetables  Snk ( PM): D ( PM): soups and casseroles  Snk ( PM): bread with peanut butter or cheese or luna bar and nuts  Beverages: water, tea  Usual physical activity: ADL's  Estimated energy needs: 1800 calories 200 g carbohydrates 135 g protein 50 g fat  Progress Towards Goal(s):  In progress.   Nutritional Diagnosis:  [place]-1.4 Altered GI function As related to celiac disease.  As evidenced by low energy, pallor of skin, and bloating.    Intervention:  Nutrition counseling for celiac disease. Dietitian educated the pt on why and how to avoid gluten completely.  Goals: -Choose whole GRAIN not whole wheat foods  -Check your medications for gluten free -Take a gluten free multivitamin with iron and at least 2 calcium supplements a day taking these at least 2 hours a part -No gluten ever again forever  -All processed foods sauces gravies and processed foods are  sneaky sources of gluten  -Do not trust gluten free goods from restaurants that make other gluten containing foods -Do not eat out bring your own foods -Try to walk for 150 minutes a week and do some sort of resistance exercise at least 2-3 days a week: resistance bands, yoga, pilates -Aim to eat non-starchy vegetables at least 2 times a day 7 days a week  -It would be worth your time to get sneaky with your fruit   Teaching Method Utilized:  Visual Auditory Hands on  Handouts given during visit include:  Shopping for gluten free  Barriers to learning/adherence to lifestyle change: slight denial  Demonstrated degree of understanding via:  Teach Back   Monitoring/Evaluation:  Dietary intake, exercise, and body weight prn.

## 2018-07-14 NOTE — Patient Instructions (Addendum)
-  Choose whole GRAIN not whole wheat foods   -Check your medications for gluten free  -Take a gluten free multivitamin with iron and at least 2 calcium supplements a day taking these at least 2 hours a part  -No gluten ever again forever   -All processed foods sauces gravies and processed foods are sneaky sources of gluten   -Do not trust gluten free goods from restaurants that make other gluten containing foods  -Do not eat out bring your own foods  -Try to walk for 150 minutes a week and do some sort of resistance exercise at least 2-3 days a week: resistance bands, yoga, pilates  -Aim to eat non-starchy vegetables at least 2 times a day 7 days a week   -It would be worth your time to get sneaky with your fruit

## 2018-07-19 ENCOUNTER — Encounter: Payer: Self-pay | Admitting: Physician Assistant

## 2018-07-20 DIAGNOSIS — L905 Scar conditions and fibrosis of skin: Secondary | ICD-10-CM | POA: Diagnosis not present

## 2018-07-20 DIAGNOSIS — R32 Unspecified urinary incontinence: Secondary | ICD-10-CM | POA: Diagnosis not present

## 2018-07-20 DIAGNOSIS — N3943 Post-void dribbling: Secondary | ICD-10-CM | POA: Diagnosis not present

## 2018-07-23 ENCOUNTER — Other Ambulatory Visit: Payer: Self-pay

## 2018-07-23 ENCOUNTER — Ambulatory Visit (INDEPENDENT_AMBULATORY_CARE_PROVIDER_SITE_OTHER): Payer: BLUE CROSS/BLUE SHIELD | Admitting: Family Medicine

## 2018-07-23 ENCOUNTER — Encounter: Payer: Self-pay | Admitting: Family Medicine

## 2018-07-23 VITALS — BP 118/77 | HR 84 | Temp 98.2°F | Ht 65.0 in | Wt 153.0 lb

## 2018-07-23 DIAGNOSIS — J0120 Acute ethmoidal sinusitis, unspecified: Secondary | ICD-10-CM | POA: Diagnosis not present

## 2018-07-23 DIAGNOSIS — R233 Spontaneous ecchymoses: Secondary | ICD-10-CM

## 2018-07-23 DIAGNOSIS — R238 Other skin changes: Secondary | ICD-10-CM | POA: Diagnosis not present

## 2018-07-23 LAB — CBC WITH DIFFERENTIAL/PLATELET
Basophils Absolute: 0.1 10*3/uL (ref 0.0–0.2)
Basos: 1 %
EOS (ABSOLUTE): 0.2 10*3/uL (ref 0.0–0.4)
Eos: 4 %
Hematocrit: 38.7 % (ref 34.0–46.6)
Hemoglobin: 13.2 g/dL (ref 11.1–15.9)
Immature Grans (Abs): 0 10*3/uL (ref 0.0–0.1)
Immature Granulocytes: 0 %
Lymphocytes Absolute: 1.7 10*3/uL (ref 0.7–3.1)
Lymphs: 41 %
MCH: 31.7 pg (ref 26.6–33.0)
MCHC: 34.1 g/dL (ref 31.5–35.7)
MCV: 93 fL (ref 79–97)
Monocytes Absolute: 0.3 10*3/uL (ref 0.1–0.9)
Monocytes: 7 %
Neutrophils Absolute: 1.9 10*3/uL (ref 1.4–7.0)
Neutrophils: 47 %
Platelets: 294 10*3/uL (ref 150–450)
RBC: 4.16 x10E6/uL (ref 3.77–5.28)
RDW: 12.7 % (ref 12.3–15.4)
WBC: 4.2 10*3/uL (ref 3.4–10.8)

## 2018-07-23 MED ORDER — AMOXICILLIN-POT CLAVULANATE 875-125 MG PO TABS: 1.0000 | ORAL_TABLET | Freq: Two times a day (BID) | ORAL | 0 refills | Status: DC

## 2018-07-23 NOTE — Patient Instructions (Addendum)
If you have lab work done today you will be contacted with your lab results within the next 2 weeks.  If you have not heard from [place] then please contact us. The fastest way to get your results is to register for My Chart.   IF you received an x-ray today, you will receive an invoice from [facility]. Please contact [facility] at [phone] with questions or concerns regarding your invoice.   IF you received labwork today, you will receive an invoice from [facility]. Please contact [facility] at [phone] with questions or concerns regarding your invoice.   Our billing staff will not be able to assist you with questions regarding bills from these companies.  You will be contacted with the lab results as soon as they are available. The fastest way to get your results is to activate your My Chart account. Instructions are located on the last page of this paperwork. If you have not heard from [place] regarding the results in 2 weeks, please contact this office.     Sinusitis, Adult Sinusitis is soreness and inflammation of your sinuses. Sinuses are hollow spaces in the bones around your face. They are located:  Around your eyes.  In the middle of your forehead.  Behind your nose.  In your cheekbones.  Your sinuses and nasal passages are lined with a stringy fluid (mucus). Mucus normally drains out of your sinuses. When your nasal tissues get inflamed or swollen, the mucus can get trapped or blocked so air cannot flow through your sinuses. This lets bacteria, viruses, and funguses grow, and that leads to infection. Follow these instructions at home: Medicines  Take, use, or apply over-the-counter and prescription medicines only as told by your doctor. These may include nasal sprays.  If you were prescribed an antibiotic medicine, take it as told by your doctor. Do not stop taking the antibiotic even if you start to feel better. Hydrate and Humidify  Drink enough  water to keep your pee (urine) clear or pale yellow.  Use a cool mist humidifier to keep the humidity level in your home above 50%.  Breathe in steam for 10-15 minutes, 3-4 times a day or as told by your doctor. You can do this in the bathroom while a hot shower is running.  Try not to spend time in cool or dry air. Rest  Rest as much as possible.  Sleep with your head raised (elevated).  Make sure to get enough sleep each night. General instructions  Put a warm, moist washcloth on your face 3-4 times a day or as told by your doctor. This will help with discomfort.  Wash your hands often with soap and water. If there is no soap and water, use hand sanitizer.  Do not smoke. Avoid being around people who are smoking (secondhand smoke).  Keep all follow-up visits as told by your doctor. This is important. Contact a doctor if:  You have a fever.  Your symptoms get worse.  Your symptoms do not get better within 10 days. Get help right away if:  You have a very bad headache.  You cannot stop throwing up (vomiting).  You have pain or swelling around your face or eyes.  You have trouble seeing.  You feel confused.  Your neck is stiff.  You have trouble breathing. This information is not intended to replace advice given to you by your health care provider. Make sure you discuss any questions you have with your health  care provider. Document Released: 03/24/2008 Document Revised: 06/01/2016 Document Reviewed: 08/01/2015 Elsevier Interactive Patient Education  [employer].

## 2018-07-23 NOTE — Progress Notes (Signed)
10/4/20199:01 AM  Mary Wyatt 29-Dec-1985, 32 y.o. female [id number]  Chief Complaint  Patient presents with  . Sinus Problem    for the past 3 wks, taking claritin, nasacort, singular, ans other salines to help with the congestions. None are working    HPI:   Patient is a 32 y.o. female who presents today for sinus pain x 3 weeks  3 weeks of nasal congestion, sinus pain Feeling very tired Thick PND, clear No fever, maybe chills Left ear pressure Lots of sneezing, claritin, singulair, nasacort helps Has had allergy testing twice, mild mold allergy However she reports very seasonal symptoms of march and sept Last abx was more than a month ago  She has also had a weird rash. bruise on her left thigh that has since resolved, late june  Possible celiac disease, she has started gluten free diet  Fall Risk  07/23/2018 07/14/2018 06/28/2018 06/18/2018 03/31/2018  Falls in the past year? No No No Yes No     Depression screen [facility] 2/9 07/23/2018 07/14/2018 06/28/2018  Decreased Interest 0 0 0  Down, Depressed, Hopeless 0 0 0  PHQ - 2 Score 0 0 0  Altered sleeping - - -  Tired, decreased energy - - -  Change in appetite - - -  Feeling bad or failure about yourself  - - -  Trouble concentrating - - -  Moving slowly or fidgety/restless - - -  Suicidal thoughts - - -  PHQ-9 Score - - -  Difficult doing work/chores - - -  Some recent data might be hidden    Allergies  Allergen Reactions  . Diflucan [Fluconazole] Hives    Prior to Admission medications   Medication Sig Start Date End Date Taking? Authorizing Provider  docusate sodium (COLACE) 100 MG capsule Take 100 mg by mouth 2 (two) times daily as needed for mild constipation.   Yes [provider]  Norethin Ace-Eth Estrad-FE 1-20 MG-MCG(24) CHEW TAKE 1 TABLET BY MOUTH EVERY DAY CONTINUOUSLY (SKIPPING PLACEBOS) 12/23/17  Yes [provider]  triamcinolone cream (KENALOG) 0.1 % Apply 1 application topically  2 (two) times daily. 02/27/18  Yes Wardell Honour, MD    Past Medical History:  Diagnosis Date  . Allergy   . Anemia   . Anxiety   . Chest pain   . Depression   . Genital warts   . GERD (gastroesophageal reflux disease)   . Interstitial cystitis   . RMSF [facility] spotted fever) 2015  . Ulcer     Past Surgical History:  Procedure Laterality Date  . OVARIAN CYST REMOVAL    . TONSILLECTOMY      Social History   Tobacco Use  . Smoking status: Former Research scientist (life sciences)  . Smokeless tobacco: Never Used  Substance Use Topics  . Alcohol use: Yes    Alcohol/week: 7.0 standard drinks    Types: 7 Standard drinks or equivalent per week    Comment: Drinks at least 1 drink a day of different things    Family History  Problem Relation Age of Onset  . GI problems Sister   . Asthma Sister   . Anxiety disorder Sister   . Heart disease Maternal Grandmother   . Anxiety disorder Mother   . Depression Mother   . Alcohol abuse Maternal Grandfather   . Anxiety disorder Sister   . Cancer Maternal Aunt        breast  . OCD Maternal Aunt   . Colon cancer  Neg Hx   . Pancreatic cancer Neg Hx        Not to patient's knowlwdge  . Rectal cancer Neg Hx        Not to patient's knowledge    ROS Per hpi  OBJECTIVE:  Blood pressure 118/77, pulse 84, temperature 98.2 F (36.8 C), temperature source Oral, height 5' 5"  (1.651 m), weight 153 lb (69.4 kg), SpO2 98 %. Body mass index is 25.46 kg/m.   Physical Exam  Constitutional: She is oriented to person, place, and time. She appears well-developed and well-nourished.  Dark circles under her eyes, looks tired  HENT:  Head: Normocephalic and atraumatic.  Right Ear: Hearing, tympanic membrane, external ear and ear canal normal.  Left Ear: Hearing, tympanic membrane, external ear and ear canal normal.  Nose: Mucosal edema (left nares red and swollen, right nares normal) present. Left sinus exhibits maxillary sinus tenderness and frontal sinus  tenderness.  Mouth/Throat: Oropharynx is clear and moist.  Eyes: Pupils are equal, round, and reactive to light. Conjunctivae and EOM are normal.  Neck: Neck supple.  Cardiovascular: Normal rate, regular rhythm and normal heart sounds. Exam reveals no gallop and no friction rub.  No murmur heard. Pulmonary/Chest: Effort normal and breath sounds normal. She has no wheezes. She has no rales.  Musculoskeletal: She exhibits no edema.  Lymphadenopathy:    She has no cervical adenopathy.  Neurological: She is alert and oriented to person, place, and time.  Skin: Skin is warm and dry.  Psychiatric: She has a normal mood and affect.  Nursing note and vitals reviewed.   ASSESSMENT and PLAN  1. Acute non-recurrent ethmoidal sinusitis Discussed supportive measures, new meds r/se/b and RTC precautions. Patient educational handout given. 2. Easy bruising - CBC with Differential/Platelet  Other orders - amoxicillin-clavulanate (AUGMENTIN) 875-125 MG tablet; Take 1 tablet by mouth 2 (two) times daily.  Return if symptoms worsen or fail to improve.    Rutherford Guys, MD Primary Care at [place], [place] [address] Ph.  [phone] Fax [phone]

## 2018-07-26 DIAGNOSIS — R32 Unspecified urinary incontinence: Secondary | ICD-10-CM | POA: Diagnosis not present

## 2018-07-26 DIAGNOSIS — R531 Weakness: Secondary | ICD-10-CM | POA: Diagnosis not present

## 2018-07-26 DIAGNOSIS — N3943 Post-void dribbling: Secondary | ICD-10-CM | POA: Diagnosis not present

## 2018-07-26 DIAGNOSIS — L905 Scar conditions and fibrosis of skin: Secondary | ICD-10-CM | POA: Diagnosis not present

## 2018-07-27 ENCOUNTER — Ambulatory Visit (INDEPENDENT_AMBULATORY_CARE_PROVIDER_SITE_OTHER): Payer: BLUE CROSS/BLUE SHIELD | Admitting: Clinical

## 2018-07-27 DIAGNOSIS — F419 Anxiety disorder, unspecified: Secondary | ICD-10-CM

## 2018-08-02 DIAGNOSIS — N3943 Post-void dribbling: Secondary | ICD-10-CM | POA: Diagnosis not present

## 2018-08-02 DIAGNOSIS — L905 Scar conditions and fibrosis of skin: Secondary | ICD-10-CM | POA: Diagnosis not present

## 2018-08-02 DIAGNOSIS — R531 Weakness: Secondary | ICD-10-CM | POA: Diagnosis not present

## 2018-08-02 DIAGNOSIS — R32 Unspecified urinary incontinence: Secondary | ICD-10-CM | POA: Diagnosis not present

## 2018-08-09 ENCOUNTER — Ambulatory Visit (INDEPENDENT_AMBULATORY_CARE_PROVIDER_SITE_OTHER): Payer: BLUE CROSS/BLUE SHIELD | Admitting: Clinical

## 2018-08-09 DIAGNOSIS — F419 Anxiety disorder, unspecified: Secondary | ICD-10-CM

## 2018-08-10 ENCOUNTER — Other Ambulatory Visit: Payer: Self-pay

## 2018-08-10 ENCOUNTER — Ambulatory Visit (INDEPENDENT_AMBULATORY_CARE_PROVIDER_SITE_OTHER): Payer: BLUE CROSS/BLUE SHIELD | Admitting: Family Medicine

## 2018-08-10 ENCOUNTER — Encounter: Payer: Self-pay | Admitting: Family Medicine

## 2018-08-10 VITALS — BP 104/68 | HR 67 | Temp 98.1°F | Ht 65.0 in | Wt 153.4 lb

## 2018-08-10 DIAGNOSIS — J329 Chronic sinusitis, unspecified: Secondary | ICD-10-CM | POA: Diagnosis not present

## 2018-08-10 MED ORDER — IPRATROPIUM BROMIDE 0.03 % NA SOLN: 2.0000 | Freq: Two times a day (BID) | NASAL | 1 refills | Status: DC

## 2018-08-10 NOTE — Progress Notes (Signed)
10/22/201910:10 AM  Mary Wyatt 25-Sep-1986, 32 y.o. female [id number]  Chief Complaint  Patient presents with  . Sinus Problem    started mid sept  . Otalgia    started mid sept    HPI:   Patient is a 32 y.o. female presents today for continuous sinus issues  Seen on Jul 23, 2018 by me - treated with Augmentin takes claritin, flonase, and nasal saline daily Takes decongestant prn Does not see any improvement  Inside of her nose hurts, pressure underneath her eyes, PND with sore throat, ear pressure  Has tried singulair in the past Has tried prednisone twice Has done 5 course of abx since nov 2018  Has been having recurring sinus problems since she moved back to [place] in 2018 Sinus infection - oct 2019, march 2019, dec 2018, nov 2018  Had seen Dr Ernesto Rutherford, ENT for eustachian tube dysfunction twice, no imagining or scopes Last allergy test in may 2019, mild mold allergy Previous testing was similar  Fall Risk  08/10/2018 07/23/2018 07/14/2018 06/28/2018 06/18/2018  Falls in the past year? No No No No Yes     Depression screen [facility] 2/9 08/10/2018 07/23/2018 07/14/2018  Decreased Interest 0 0 0  Down, Depressed, Hopeless 0 0 0  PHQ - 2 Score 0 0 0  Altered sleeping - - -  Tired, decreased energy - - -  Change in appetite - - -  Feeling bad or failure about yourself  - - -  Trouble concentrating - - -  Moving slowly or fidgety/restless - - -  Suicidal thoughts - - -  PHQ-9 Score - - -  Difficult doing work/chores - - -  Some recent data might be hidden    Allergies  Allergen Reactions  . Diflucan [Fluconazole] Hives    Prior to Admission medications   Medication Sig Start Date End Date Taking? Authorizing Provider  docusate sodium (COLACE) 100 MG capsule Take 100 mg by mouth 2 (two) times daily as needed for mild constipation.   Yes [provider]  fluticasone (FLONASE) 50 MCG/ACT nasal spray Place into both nostrils daily.   Yes [provider]  loratadine (CLARITIN) 10 MG tablet  07/05/18  Yes [provider]  Norethin Ace-Eth Estrad-FE 1-20 MG-MCG(24) CHEW TAKE 1 TABLET BY MOUTH EVERY DAY CONTINUOUSLY (SKIPPING PLACEBOS) 12/23/17  Yes [provider]  triamcinolone cream (KENALOG) 0.1 % Apply 1 application topically 2 (two) times daily. 02/27/18  Yes Wardell Honour, MD    Past Medical History:  Diagnosis Date  . Allergy   . Anemia   . Anxiety   . Chest pain   . Depression   . Genital warts   . GERD (gastroesophageal reflux disease)   . Interstitial cystitis   . RMSF [facility] spotted fever) 2015  . Ulcer     Past Surgical History:  Procedure Laterality Date  . OVARIAN CYST REMOVAL    . TONSILLECTOMY      Social History   Tobacco Use  . Smoking status: Former Research scientist (life sciences)  . Smokeless tobacco: Never Used  Substance Use Topics  . Alcohol use: Yes    Alcohol/week: 7.0 standard drinks    Types: 7 Standard drinks or equivalent per week    Comment: Drinks at least 1 drink a day of different things    Family History  Problem Relation Age of Onset  . GI problems Sister   . Asthma Sister   . Anxiety disorder Sister   .  Heart disease Maternal Grandmother   . Anxiety disorder Mother   . Depression Mother   . Alcohol abuse Maternal Grandfather   . Anxiety disorder Sister   . Cancer Maternal Aunt        breast  . OCD Maternal Aunt   . Colon cancer Neg Hx   . Pancreatic cancer Neg Hx        Not to patient's knowlwdge  . Rectal cancer Neg Hx        Not to patient's knowledge    ROS Per hpi  OBJECTIVE:  Blood pressure 104/68, pulse 67, temperature 98.1 F (36.7 C), temperature source Oral, height 5' 5"  (1.651 m), weight 153 lb 6.4 oz (69.6 kg), SpO2 99 %. Body mass index is 25.53 kg/m.   Physical Exam  Constitutional: She is oriented to person, place, and time. She appears well-developed and well-nourished.  HENT:  Head: Normocephalic and atraumatic.  Right Ear: Hearing, tympanic  membrane, external ear and ear canal normal.  Left Ear: Hearing, tympanic membrane, external ear and ear canal normal.  Nose: Mucosal edema present. No rhinorrhea.  Mouth/Throat: Oropharynx is clear and moist.  Ethmoid sinus TTP  Eyes: Pupils are equal, round, and reactive to light. Conjunctivae and EOM are normal.  Neck: Neck supple.  Cardiovascular: Normal rate, regular rhythm and normal heart sounds. Exam reveals no gallop and no friction rub.  No murmur heard. Pulmonary/Chest: Effort normal and breath sounds normal. She has no wheezes. She has no rales.  Musculoskeletal: She exhibits no edema.  Lymphadenopathy:    She has no cervical adenopathy.  Neurological: She is alert and oriented to person, place, and time.  Skin: Skin is warm and dry.  Psychiatric: She has a normal mood and affect.  Nursing note and vitals reviewed.  No results found for this or any previous visit (from the past 24 hour(s)).  ASSESSMENT and PLAN  1. Chronic sinusitis, unspecified location Uncontrolled. Adding atrovent. CT sinus to eval for structural contributors.  - CT STEALTH SINUS W/O CONTRAST; Future - POCT urine pregnancy; Future  Other orders - loratadine (CLARITIN) 10 MG tablet - fluticasone (FLONASE) 50 MCG/ACT nasal spray; Place into both nostrils daily. - ipratropium (ATROVENT) 0.03 % nasal spray; Place 2 sprays into both nostrils 2 (two) times daily.  Return if symptoms worsen or fail to improve.    Rutherford Guys, MD Primary Care at [place], [place] [address] Ph.  [phone] Fax [phone]

## 2018-08-10 NOTE — Patient Instructions (Signed)
° ° ° °  If you have lab work done today you will be contacted with your lab results within the next 2 weeks.  If you have not heard from us then please contact us. The fastest way to get your results is to register for My Chart. ° ° °IF you received an x-ray today, you will receive an invoice from [facility]. Please contact  Radiology at [phone] with questions or concerns regarding your invoice.  ° °IF you received labwork today, you will receive an invoice from [facility]. Please contact [facility] at [phone] with questions or concerns regarding your invoice.  ° °Our billing staff will not be able to assist you with questions regarding bills from these companies. ° °You will be contacted with the lab results as soon as they are available. The fastest way to get your results is to activate your My Chart account. Instructions are located on the last page of this paperwork. If you have not heard from us regarding the results in 2 weeks, please contact this office. °  ° ° ° °

## 2018-08-11 DIAGNOSIS — R531 Weakness: Secondary | ICD-10-CM | POA: Diagnosis not present

## 2018-08-11 DIAGNOSIS — N3943 Post-void dribbling: Secondary | ICD-10-CM | POA: Diagnosis not present

## 2018-08-11 DIAGNOSIS — R32 Unspecified urinary incontinence: Secondary | ICD-10-CM | POA: Diagnosis not present

## 2018-08-11 DIAGNOSIS — L905 Scar conditions and fibrosis of skin: Secondary | ICD-10-CM | POA: Diagnosis not present

## 2018-08-13 ENCOUNTER — Other Ambulatory Visit: Payer: Self-pay

## 2018-08-13 ENCOUNTER — Telehealth: Payer: Self-pay | Admitting: Family Medicine

## 2018-08-13 DIAGNOSIS — J329 Chronic sinusitis, unspecified: Secondary | ICD-10-CM

## 2018-08-13 NOTE — Telephone Encounter (Signed)
PLEASE REVIEW AND ADVISE

## 2018-08-13 NOTE — Telephone Encounter (Signed)
Copied from [place] [id number]. Topic: Quick Communication - See Telephone Encounter >> Aug 13, 2018 11:28 AM Virl Axe D wrote: CRM for notification. See Telephone encounter for: 08/13/18. Miriam with [facility] stated the CT STEALTH SINUS W/O CONTRAST needs to be changed to CT Maxillofacial WO CM and add "Fusion Protocol" to comments

## 2018-08-16 DIAGNOSIS — L814 Other melanin hyperpigmentation: Secondary | ICD-10-CM | POA: Diagnosis not present

## 2018-08-16 DIAGNOSIS — D225 Melanocytic nevi of trunk: Secondary | ICD-10-CM | POA: Diagnosis not present

## 2018-08-16 DIAGNOSIS — D2230 Melanocytic nevi of unspecified part of face: Secondary | ICD-10-CM | POA: Diagnosis not present

## 2018-08-16 DIAGNOSIS — D1801 Hemangioma of skin and subcutaneous tissue: Secondary | ICD-10-CM | POA: Diagnosis not present

## 2018-08-18 DIAGNOSIS — R32 Unspecified urinary incontinence: Secondary | ICD-10-CM | POA: Diagnosis not present

## 2018-08-18 DIAGNOSIS — N3943 Post-void dribbling: Secondary | ICD-10-CM | POA: Diagnosis not present

## 2018-08-18 DIAGNOSIS — L905 Scar conditions and fibrosis of skin: Secondary | ICD-10-CM | POA: Diagnosis not present

## 2018-08-18 DIAGNOSIS — R531 Weakness: Secondary | ICD-10-CM | POA: Diagnosis not present

## 2018-08-25 DIAGNOSIS — R32 Unspecified urinary incontinence: Secondary | ICD-10-CM | POA: Diagnosis not present

## 2018-08-25 DIAGNOSIS — N3943 Post-void dribbling: Secondary | ICD-10-CM | POA: Diagnosis not present

## 2018-08-25 DIAGNOSIS — L905 Scar conditions and fibrosis of skin: Secondary | ICD-10-CM | POA: Diagnosis not present

## 2018-08-25 DIAGNOSIS — R531 Weakness: Secondary | ICD-10-CM | POA: Diagnosis not present

## 2018-08-26 ENCOUNTER — Encounter: Payer: Self-pay | Admitting: Gastroenterology

## 2018-08-26 ENCOUNTER — Ambulatory Visit (INDEPENDENT_AMBULATORY_CARE_PROVIDER_SITE_OTHER): Payer: BLUE CROSS/BLUE SHIELD | Admitting: Gastroenterology

## 2018-08-26 VITALS — BP 110/80 | HR 92 | Ht 65.0 in | Wt 155.4 lb

## 2018-08-26 DIAGNOSIS — K900 Celiac disease: Secondary | ICD-10-CM

## 2018-08-26 DIAGNOSIS — K5900 Constipation, unspecified: Secondary | ICD-10-CM

## 2018-08-26 NOTE — Progress Notes (Signed)
    History of Present Illness: This is a 32 year old female with celiac disease. Nutrition visit on 9/25 reviewed.  She relates that since closely following a gluten-free diet she has developed mild constipation and has had a 5 lb weight gain.  She otherwise feels well.  Colace has occasionally been helpful for management of constipation but it is not always effective.  She expressed multiple concerns about celiac disease and its long-term management.  Colonoscopy 06/2018 - The examined portion of the ileum was normal. - The entire examined colon is normal on direct and retroflexion views. - No specimens collected.  EGD 06/2018 - Normal esophagus. - Normal stomach. - Scalloped mucosa was found in the duodenum, rule out celiac disease. Biopsied. - MILDLY INCREASED INTRAEPITHELIAL LYMPHOCYTES ASSOCIATED WITH VILLOUS BLUNTING.  Current Medications, Allergies, Past Medical History, Past Surgical History, Family History and Social History were reviewed in [facility] electronic medical record.  Physical Exam: General: Well developed, well nourished, no acute distress Head: Normocephalic and atraumatic Eyes:  sclerae anicteric, EOMI Ears: Normal auditory acuity Mouth: No deformity or lesions Lungs: Clear throughout to auscultation Heart: Regular rate and rhythm; no murmurs, rubs or bruits Abdomen: Soft, non tender and non distended. No masses, hepatosplenomegaly or hernias noted. Normal Bowel sounds Rectal: Not done Musculoskeletal: Symmetrical with no gross deformities  Pulses:  Normal pulses noted Extremities: No clubbing, cyanosis, edema or deformities noted Neurological: Alert oriented x 4, grossly nonfocal Psychological:  Alert and cooperative. Normal mood and affect   Assessment and Recommendations:  1. Celiac disease.  We had a long discussion about the diagnosis, her HLA markers, duodenal biopsies and the recommended treatment of a long-term gluten-free diet.  She was  concerned about the accuracy of the diagnosis.  I reassured her that her diagnosis was clearly established.  Offered referral for a second opinion locally or at a tertiary center and she declined at this time.  Recommend EGD with duodenal biopsies in 1 year.  If she prefers we could perform repeat upper endoscopy as early as 6 months from now.  She appears committed to following a gluten-free diet.  Add MiraLAX daily as needed for management of mild constipation.  I spent 15 minutes of face-to-face time with the patient. Greater than 50% of the time was spent counseling and coordinating care.  

## 2018-08-26 NOTE — Patient Instructions (Signed)
Remain on a gluten free diet.   You will be due for a recall EGD in 06/2019. We will send you a reminder in the mail when it gets closer to that time.  Thank you for choosing me and [facility].  Pricilla Riffle. Dagoberto Ligas., MD., [facility]

## 2018-08-27 ENCOUNTER — Encounter: Payer: Self-pay | Admitting: Family Medicine

## 2018-08-31 ENCOUNTER — Ambulatory Visit
Admission: RE | Admit: 2018-08-31 | Discharge: 2018-08-31 | Disposition: A | Discharge status: Home / Self Care | Payer: BLUE CROSS/BLUE SHIELD | Source: Ambulatory Visit | Attending: Family Medicine | Admitting: Family Medicine

## 2018-08-31 DIAGNOSIS — J329 Chronic sinusitis, unspecified: Secondary | ICD-10-CM | POA: Diagnosis not present

## 2018-09-01 ENCOUNTER — Other Ambulatory Visit: Payer: Self-pay | Admitting: Family Medicine

## 2018-09-01 DIAGNOSIS — D320 Benign neoplasm of cerebral meninges: Secondary | ICD-10-CM

## 2018-09-02 ENCOUNTER — Telehealth: Payer: Self-pay | Admitting: Family Medicine

## 2018-09-02 ENCOUNTER — Encounter: Payer: Self-pay | Admitting: Family Medicine

## 2018-09-02 ENCOUNTER — Ambulatory Visit (INDEPENDENT_AMBULATORY_CARE_PROVIDER_SITE_OTHER): Payer: BLUE CROSS/BLUE SHIELD | Admitting: Family Medicine

## 2018-09-02 DIAGNOSIS — D320 Benign neoplasm of cerebral meninges: Secondary | ICD-10-CM

## 2018-09-02 DIAGNOSIS — J329 Chronic sinusitis, unspecified: Secondary | ICD-10-CM

## 2018-09-02 NOTE — Telephone Encounter (Signed)
Pt requesting a call to review results of CT.  Is able to see results on MyChart.

## 2018-09-02 NOTE — Telephone Encounter (Signed)
Copied from [place] [id number]. Topic: Quick Communication - Other Results (Clinic Use ONLY) >> Sep 02, 2018  7:03 AM Lennox Solders wrote: Pt is calling and would like a nurse to go over ct sinus scan . Pt is able to view on mychart

## 2018-09-03 LAB — BASIC METABOLIC PANEL
BUN/Creatinine Ratio: 20 (ref 9–23)
BUN: 11 mg/dL (ref 6–20)
CO2: 20 mmol/L (ref 20–29)
Calcium: 9.7 mg/dL (ref 8.7–10.2)
Chloride: 103 mmol/L (ref 96–106)
Creatinine, Ser: 0.56 mg/dL — ABNORMAL LOW (ref 0.57–1.00)
GFR calc Af Amer: 141 mL/min/{1.73_m2} (ref >59)
GFR calc non Af Amer: 122 mL/min/{1.73_m2} (ref >59)
Glucose: 80 mg/dL (ref 65–99)
Potassium: 4.5 mmol/L (ref 3.5–5.2)
Sodium: 139 mmol/L (ref 134–144)

## 2018-09-03 NOTE — Telephone Encounter (Signed)
This is a delayed entry I spoke with patient wed nov 14 around 9am. All questions answered, next steps discussed

## 2018-09-06 ENCOUNTER — Encounter: Payer: Self-pay | Admitting: Family Medicine

## 2018-09-06 ENCOUNTER — Ambulatory Visit (INDEPENDENT_AMBULATORY_CARE_PROVIDER_SITE_OTHER): Payer: BLUE CROSS/BLUE SHIELD | Admitting: Clinical

## 2018-09-06 DIAGNOSIS — F411 Generalized anxiety disorder: Secondary | ICD-10-CM | POA: Diagnosis not present

## 2018-09-07 DIAGNOSIS — D329 Benign neoplasm of meninges, unspecified: Secondary | ICD-10-CM | POA: Diagnosis not present

## 2018-09-07 DIAGNOSIS — F329 Major depressive disorder, single episode, unspecified: Secondary | ICD-10-CM | POA: Diagnosis not present

## 2018-09-07 DIAGNOSIS — H01025 Squamous blepharitis left lower eyelid: Secondary | ICD-10-CM | POA: Diagnosis not present

## 2018-09-07 DIAGNOSIS — H01022 Squamous blepharitis right lower eyelid: Secondary | ICD-10-CM | POA: Diagnosis not present

## 2018-09-07 DIAGNOSIS — F419 Anxiety disorder, unspecified: Secondary | ICD-10-CM | POA: Diagnosis not present

## 2018-09-08 DIAGNOSIS — N3943 Post-void dribbling: Secondary | ICD-10-CM | POA: Diagnosis not present

## 2018-09-08 DIAGNOSIS — R32 Unspecified urinary incontinence: Secondary | ICD-10-CM | POA: Diagnosis not present

## 2018-09-08 DIAGNOSIS — R531 Weakness: Secondary | ICD-10-CM | POA: Diagnosis not present

## 2018-09-11 ENCOUNTER — Ambulatory Visit
Admission: RE | Admit: 2018-09-11 | Discharge: 2018-09-11 | Disposition: A | Discharge status: Home / Self Care | Payer: BLUE CROSS/BLUE SHIELD | Source: Ambulatory Visit | Attending: Family Medicine | Admitting: Family Medicine

## 2018-09-11 DIAGNOSIS — D320 Benign neoplasm of cerebral meninges: Secondary | ICD-10-CM

## 2018-09-11 MED ORDER — GADOBENATE DIMEGLUMINE 529 MG/ML IV SOLN
14.0000 mL | Freq: Once | INTRAVENOUS | Status: AC | PRN
  Administered 2018-09-11: 15 mL via INTRAVENOUS

## 2018-09-19 HISTORY — PX: CRANIOTOMY: SHX93

## 2018-09-20 DIAGNOSIS — N390 Urinary tract infection, site not specified: Secondary | ICD-10-CM | POA: Diagnosis not present

## 2018-09-20 DIAGNOSIS — R350 Frequency of micturition: Secondary | ICD-10-CM | POA: Diagnosis not present

## 2018-09-20 DIAGNOSIS — N7689 Other specified inflammation of vagina and vulva: Secondary | ICD-10-CM | POA: Diagnosis not present

## 2018-09-21 ENCOUNTER — Encounter: Payer: Self-pay | Admitting: Family Medicine

## 2018-09-21 DIAGNOSIS — D329 Benign neoplasm of meninges, unspecified: Secondary | ICD-10-CM | POA: Diagnosis not present

## 2018-09-22 DIAGNOSIS — N3943 Post-void dribbling: Secondary | ICD-10-CM | POA: Diagnosis not present

## 2018-09-22 DIAGNOSIS — R32 Unspecified urinary incontinence: Secondary | ICD-10-CM | POA: Diagnosis not present

## 2018-09-22 DIAGNOSIS — L905 Scar conditions and fibrosis of skin: Secondary | ICD-10-CM | POA: Diagnosis not present

## 2018-09-22 DIAGNOSIS — R531 Weakness: Secondary | ICD-10-CM | POA: Diagnosis not present

## 2018-09-23 ENCOUNTER — Ambulatory Visit: Payer: BLUE CROSS/BLUE SHIELD | Admitting: Clinical

## 2018-09-23 ENCOUNTER — Ambulatory Visit (INDEPENDENT_AMBULATORY_CARE_PROVIDER_SITE_OTHER): Payer: BLUE CROSS/BLUE SHIELD | Admitting: Clinical

## 2018-09-23 DIAGNOSIS — F419 Anxiety disorder, unspecified: Secondary | ICD-10-CM | POA: Diagnosis not present

## 2018-10-01 DIAGNOSIS — R350 Frequency of micturition: Secondary | ICD-10-CM | POA: Diagnosis not present

## 2018-10-01 DIAGNOSIS — N390 Urinary tract infection, site not specified: Secondary | ICD-10-CM | POA: Diagnosis not present

## 2018-10-03 DIAGNOSIS — G8918 Other acute postprocedural pain: Secondary | ICD-10-CM | POA: Diagnosis not present

## 2018-10-03 DIAGNOSIS — F419 Anxiety disorder, unspecified: Secondary | ICD-10-CM | POA: Diagnosis not present

## 2018-10-03 DIAGNOSIS — R739 Hyperglycemia, unspecified: Secondary | ICD-10-CM | POA: Diagnosis not present

## 2018-10-03 DIAGNOSIS — I10 Essential (primary) hypertension: Secondary | ICD-10-CM | POA: Diagnosis not present

## 2018-10-03 DIAGNOSIS — D329 Benign neoplasm of meninges, unspecified: Secondary | ICD-10-CM | POA: Diagnosis not present

## 2018-10-03 DIAGNOSIS — G936 Cerebral edema: Secondary | ICD-10-CM | POA: Diagnosis not present

## 2018-10-03 DIAGNOSIS — T380X5A Adverse effect of glucocorticoids and synthetic analogues, initial encounter: Secondary | ICD-10-CM | POA: Diagnosis not present

## 2018-10-03 DIAGNOSIS — K900 Celiac disease: Secondary | ICD-10-CM | POA: Diagnosis not present

## 2018-10-03 DIAGNOSIS — D320 Benign neoplasm of cerebral meninges: Secondary | ICD-10-CM | POA: Diagnosis not present

## 2018-10-03 DIAGNOSIS — R112 Nausea with vomiting, unspecified: Secondary | ICD-10-CM | POA: Diagnosis not present

## 2018-10-03 DIAGNOSIS — F329 Major depressive disorder, single episode, unspecified: Secondary | ICD-10-CM | POA: Diagnosis not present

## 2018-10-03 DIAGNOSIS — N809 Endometriosis, unspecified: Secondary | ICD-10-CM | POA: Diagnosis not present

## 2018-10-03 DIAGNOSIS — N390 Urinary tract infection, site not specified: Secondary | ICD-10-CM | POA: Diagnosis not present

## 2018-10-03 DIAGNOSIS — Z01818 Encounter for other preprocedural examination: Secondary | ICD-10-CM | POA: Diagnosis not present

## 2018-10-04 DIAGNOSIS — D320 Benign neoplasm of cerebral meninges: Secondary | ICD-10-CM | POA: Diagnosis not present

## 2018-10-04 DIAGNOSIS — G936 Cerebral edema: Secondary | ICD-10-CM | POA: Diagnosis not present

## 2018-10-04 DIAGNOSIS — G8918 Other acute postprocedural pain: Secondary | ICD-10-CM | POA: Diagnosis not present

## 2018-10-04 DIAGNOSIS — D329 Benign neoplasm of meninges, unspecified: Secondary | ICD-10-CM | POA: Diagnosis not present

## 2018-10-04 DIAGNOSIS — I10 Essential (primary) hypertension: Secondary | ICD-10-CM | POA: Diagnosis not present

## 2018-10-07 ENCOUNTER — Ambulatory Visit: Payer: BLUE CROSS/BLUE SHIELD | Admitting: Clinical

## 2018-10-13 IMAGING — DX DG CHEST 2V
2 series · 2 of 2 positions shown · non-contrast
Comparison: Radiographs August 02, 2015.

CLINICAL DATA: Cough for 3 weeks.

EXAM:
CHEST  2 VIEW

[chest pa]
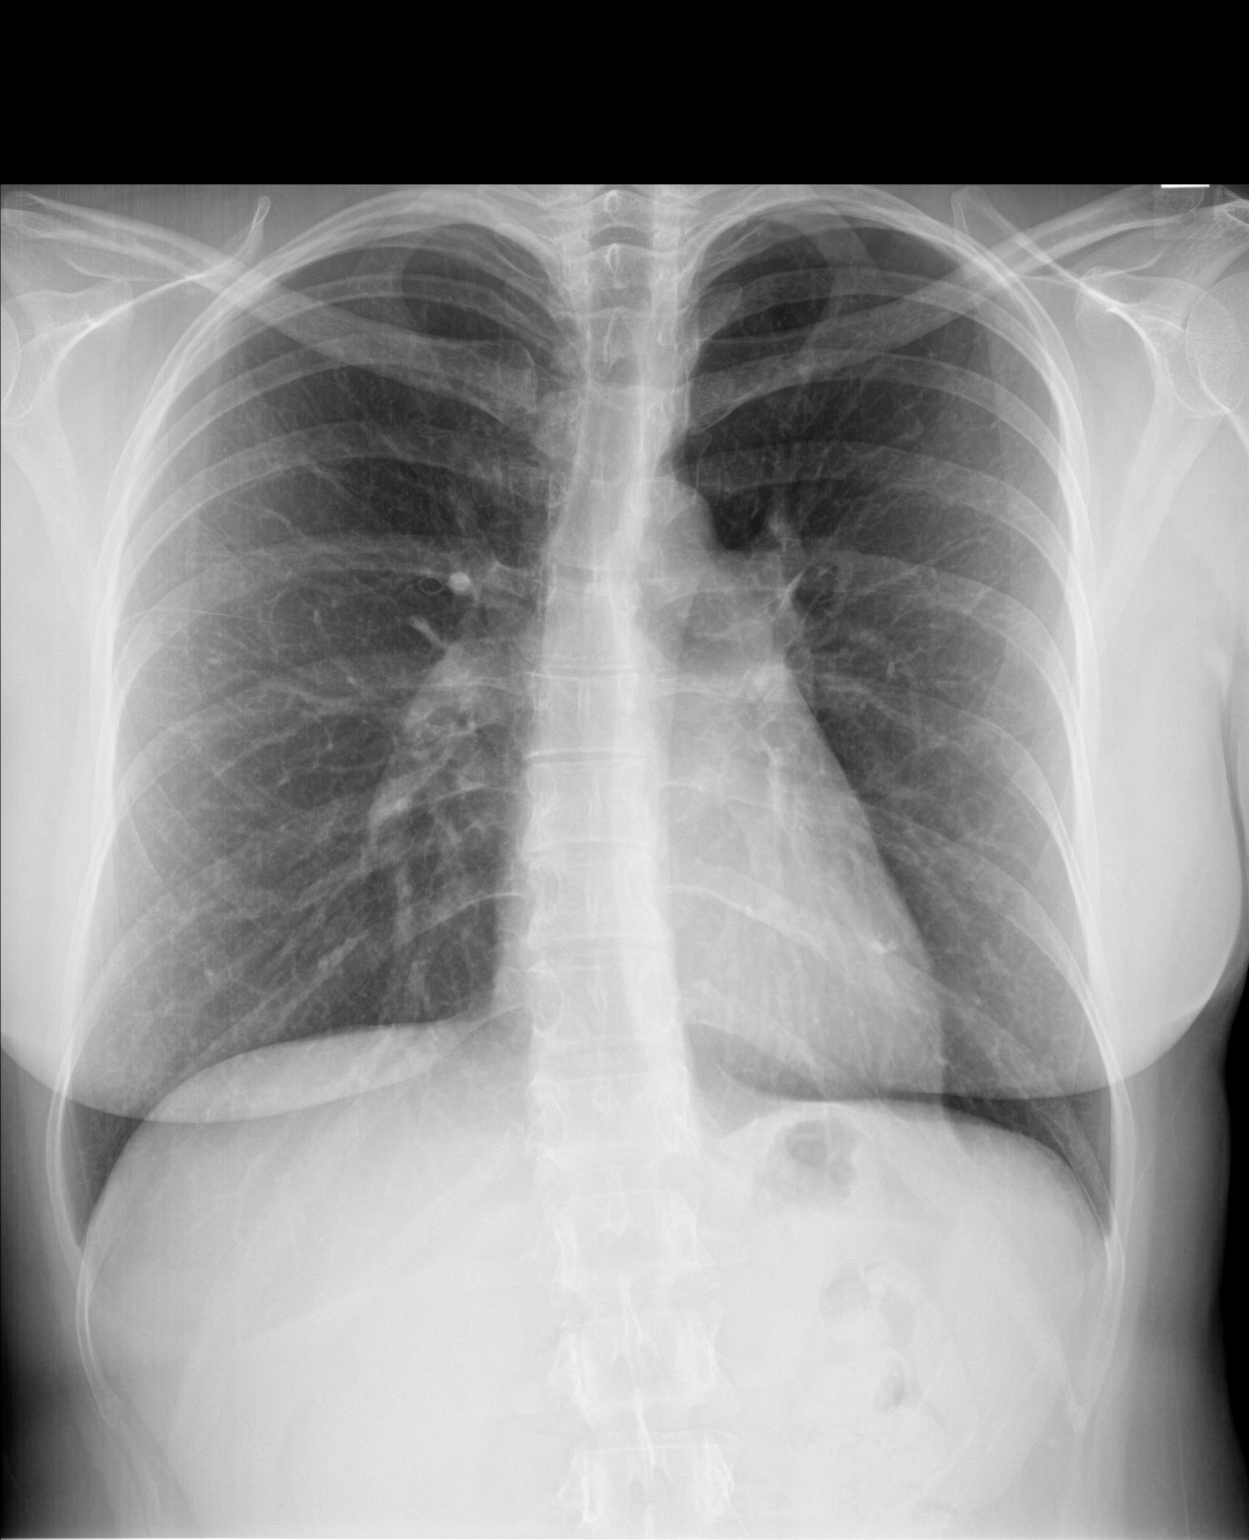

[chest lat]
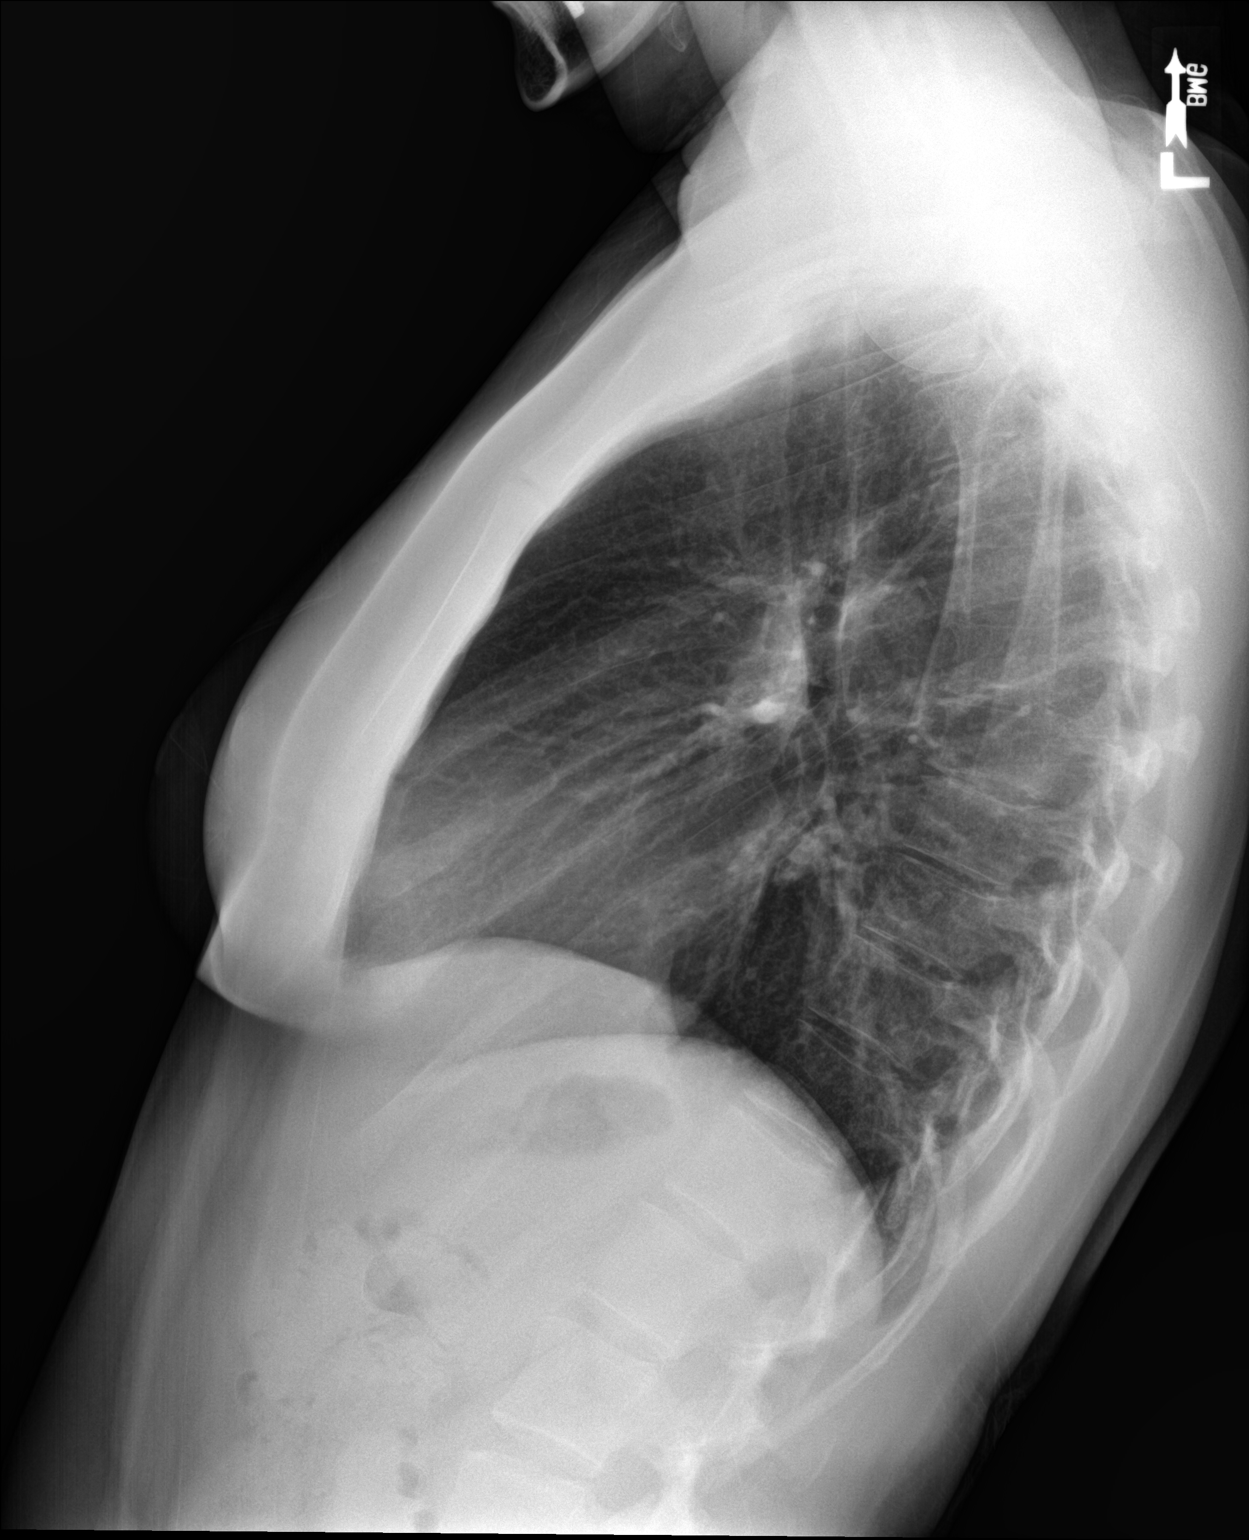

[2 of 2 positions shown; findings below may reference images not displayed]

FINDINGS: The heart size and mediastinal contours are within normal limits.
Both lungs are clear. No pneumothorax or pleural effusion is noted.
The visualized skeletal structures are unremarkable.
IMPRESSION: No active cardiopulmonary disease.

## 2018-10-15 DIAGNOSIS — Z4802 Encounter for removal of sutures: Secondary | ICD-10-CM | POA: Diagnosis not present

## 2018-10-19 ENCOUNTER — Other Ambulatory Visit: Payer: Self-pay | Admitting: Family Medicine

## 2018-10-19 DIAGNOSIS — F419 Anxiety disorder, unspecified: Secondary | ICD-10-CM | POA: Diagnosis not present

## 2018-10-19 DIAGNOSIS — I808 Phlebitis and thrombophlebitis of other sites: Secondary | ICD-10-CM | POA: Diagnosis not present

## 2018-10-19 DIAGNOSIS — F329 Major depressive disorder, single episode, unspecified: Secondary | ICD-10-CM | POA: Diagnosis not present

## 2018-10-21 ENCOUNTER — Ambulatory Visit
Admission: RE | Admit: 2018-10-21 | Discharge: 2018-10-21 | Disposition: A | Discharge status: Home / Self Care | Payer: BLUE CROSS/BLUE SHIELD | Source: Ambulatory Visit | Attending: Family Medicine | Admitting: Family Medicine

## 2018-10-21 DIAGNOSIS — I808 Phlebitis and thrombophlebitis of other sites: Secondary | ICD-10-CM

## 2018-10-21 DIAGNOSIS — I82611 Acute embolism and thrombosis of superficial veins of right upper extremity: Secondary | ICD-10-CM | POA: Diagnosis not present

## 2018-11-01 DIAGNOSIS — R9431 Abnormal electrocardiogram [ECG] [EKG]: Secondary | ICD-10-CM | POA: Diagnosis not present

## 2018-11-01 DIAGNOSIS — R42 Dizziness and giddiness: Secondary | ICD-10-CM | POA: Diagnosis not present

## 2018-11-01 DIAGNOSIS — Z86018 Personal history of other benign neoplasm: Secondary | ICD-10-CM | POA: Diagnosis not present

## 2018-11-01 DIAGNOSIS — J3089 Other allergic rhinitis: Secondary | ICD-10-CM | POA: Diagnosis not present

## 2018-11-03 ENCOUNTER — Telehealth: Payer: Self-pay | Admitting: *Deleted

## 2018-11-03 NOTE — Telephone Encounter (Signed)
REFERRAL SENT TO SCHEDULING AND NOTES ON FILE FROM [facility] [phone].

## 2018-11-05 NOTE — Progress Notes (Signed)
Mary Blender MD Reason for referral-Abnormal ECG  HPI: 33 year old female for evaluation of abnormal electrocardiogram at request of Terrill Mohr MD. Previously seen by Dr. Acie Fredrickson but not since April 2015.  Echocardiogram May 2013 showed normal LV function, systolic bowing without frank prolapse of the mitral valve and atrial septal aneurysm.  Had removal of meningioma December 2019.  At time of recent surgery she was felt to have a mildly abnormal electrocardiogram.  Cardiology asked to evaluate.  She denies dyspnea on exertion, orthopnea, PND, pedal edema, syncope or exertional chest pain.  She had an episode of chest tightness recently that lasted 36 hours continuously without associated symptoms.  Not pleuritic or positional.  Improved with reflux medications.  Cardiology now asked to evaluate.  Current Outpatient Medications  Medication Sig Dispense Refill  . Azelaic Acid 15 % cream Apply 1 Dose topically daily. After skin is thoroughly washed and patted dry, gently but thoroughly massage a thin film of azelaic acid cream into the affected area twice daily, in the morning and evening.    . Levonorgestrel-Ethinyl Estradiol (SEASONIQUE) 0.15-0.03 &0.01 MG tablet Take 1 tablet by mouth daily.    Marland Kitchen loratadine (CLARITIN) 10 MG tablet     . triamcinolone cream (KENALOG) 0.1 % Apply 1 application topically 2 (two) times daily. 30 g 1   Current Facility-Administered Medications  Medication Dose Route Frequency Provider Last Rate Last Dose  . 0.9 %  sodium chloride infusion  500 mL Intravenous Once Doran Stabler, MD        Allergies  Allergen Reactions  . Diflucan [Fluconazole] Hives     Past Medical History:  Diagnosis Date  . Allergy   . Anemia   . Anxiety   . Celiac disease   . Depression   . Genital warts   . GERD (gastroesophageal reflux disease)   . Interstitial cystitis   . Meningioma ([place])   . [facility] spotted fever) 2015  . Ulcer      Past Surgical History:  Procedure Laterality Date  . CRANIOTOMY    . OVARIAN CYST REMOVAL    . TONSILLECTOMY      Social History   Socioeconomic History  . Marital status: Single    Spouse name: Not on file  . Number of children: 0  . Years of education: Not on file  . Highest education level: Not on file  Occupational History  . Occupation: [employer]  Social Needs  . Financial resource strain: Not on file  . Food insecurity:    Worry: Not on file    Inability: Not on file  . Transportation needs:    Medical: Not on file    Non-medical: Not on file  Tobacco Use  . Smoking status: Former Research scientist (life sciences)  . Smokeless tobacco: Never Used  Substance and Sexual Activity  . Alcohol use: Not Currently    Alcohol/week: 7.0 standard drinks    Types: 7 Standard drinks or equivalent per week  . Drug use: No  . Sexual activity: Yes    Birth control/protection: Pill  Lifestyle  . Physical activity:    Days per week: Not on file    Minutes per session: Not on file  . Stress: Not on file  Relationships  . Social connections:    Talks on phone: Not on file    Gets together: Not on file    Attends religious service: Not on file    Active member of club or organization:  Not on file    Attends meetings of clubs or organizations: Not on file    Relationship status: Not on file  . Intimate partner violence:    Fear of current or ex partner: Not on file    Emotionally abused: Not on file    Physically abused: Not on file    Forced sexual activity: Not on file  Other Topics Concern  . Not on file  Social History Narrative      Marital status:  Single; parents in [place]: none      Employment: [place] [employer] works indoors       Tobacco: none      Alcohol: socially; twice weekly      Drugs: none      Exercise: sporadic; walking sometimes         Daily caffeine     Family History  Problem Relation Age of Onset  . GI problems Sister   . Asthma Sister   . Anxiety  disorder Sister   . Heart disease Maternal Grandmother   . Anxiety disorder Mother   . Depression Mother   . Anuerysm Mother   . Alcohol abuse Maternal Grandfather   . Anxiety disorder Sister   . Cancer Maternal Aunt        breast  . OCD Maternal Aunt   . Colon cancer Neg Hx   . Pancreatic cancer Neg Hx        Not to patient's knowlwdge  . Rectal cancer Neg Hx        Not to patient's knowledge    ROS: Anxiety but no fevers or chills, productive cough, hemoptysis, dysphasia, odynophagia, melena, hematochezia, dysuria, hematuria, rash, seizure activity, orthopnea, PND, pedal edema, claudication. Remaining systems are negative.  Physical Exam:   Blood pressure 102/76, pulse (!) 103, height 5' 5"  (1.651 m), weight 158 lb (71.7 kg).  General:  Well developed/well nourished in NAD Skin warm/dry Patient not depressed No peripheral clubbing Back-normal HEENT-normal eyelids; status post craniotomy Neck supple/normal carotid upstroke bilaterally; no bruits; no JVD; no thyromegaly chest - CTA/ normal expansion CV - RRR/normal S1 and S2; no murmurs, rubs or gallops;  PMI nondisplaced Abdomen -NT/ND, no HSM, no mass, + bowel sounds, no bruit 2+ femoral pulses, no bruits Ext-no edema, chords, 2+ DP Neuro-grossly nonfocal  ECG -sinus tachycardia at a rate of 103, nonspecific ST changes.  Personally reviewed  A/P  1 abnormal electrocardiogram-patient has sinus tachycardia with nonspecific ST changes.  However she has no cardiac symptoms including no exertional chest pain and no dyspnea.  No palpitations or syncope.  Previous echocardiogram showed normal LV function.  I will not pursue further evaluation at this point.  2 chest pain-symptoms resolved with reflux medications.  No exertional chest pain.  I do not think she needs further ischemia evaluation.  3 history of meningioma status post resection-follow-up neurosurgery.  Kirk Ruths, MD

## 2018-11-09 ENCOUNTER — Encounter: Payer: Self-pay | Admitting: Cardiology

## 2018-11-09 ENCOUNTER — Ambulatory Visit: Payer: BLUE CROSS/BLUE SHIELD | Admitting: Cardiology

## 2018-11-09 VITALS — BP 102/76 | HR 103 | Ht 65.0 in | Wt 158.0 lb

## 2018-11-09 DIAGNOSIS — R072 Precordial pain: Secondary | ICD-10-CM

## 2018-11-09 DIAGNOSIS — R9431 Abnormal electrocardiogram [ECG] [EKG]: Secondary | ICD-10-CM | POA: Diagnosis not present

## 2018-11-09 NOTE — Patient Instructions (Signed)
Medication Instructions:  NO CHANGE If you need a refill on your cardiac medications before your next appointment, please call your pharmacy.   Lab work: If you have labs (blood work) drawn today and your tests are completely normal, you will receive your results only by: Marland Kitchen MyChart Message (if you have MyChart) OR . A paper copy in the mail If you have any lab test that is abnormal or we need to change your treatment, we will call you to review the results.  Follow-Up: At [facility], you and your health needs are our priority.  As part of our continuing mission to provide you with exceptional heart care, we have created designated Provider Care Teams.  These Care Teams include your primary Cardiologist (physician) and Advanced Practice Providers (APPs -  Physician Assistants and Nurse Practitioners) who all work together to provide you with the care you need, when you need it. You will need a follow up appointment in 12 months.  Please call our office 2 months in advance to schedule this appointment.  You may see Kirk Ruths MD or one of the following Advanced Practice Providers on your designated Care Team:   Kerin Ransom, PA-C Roby Lofts, [place] . Sande Rives, [place]  CALL IN November TO SCHEDULE APPOINTMENT IN January 2021

## 2018-11-15 DIAGNOSIS — D329 Benign neoplasm of meninges, unspecified: Secondary | ICD-10-CM | POA: Diagnosis not present

## 2018-11-16 DIAGNOSIS — D329 Benign neoplasm of meninges, unspecified: Secondary | ICD-10-CM | POA: Diagnosis not present

## 2018-11-18 ENCOUNTER — Ambulatory Visit: Payer: BLUE CROSS/BLUE SHIELD | Admitting: Clinical

## 2018-11-18 DIAGNOSIS — F419 Anxiety disorder, unspecified: Secondary | ICD-10-CM | POA: Diagnosis not present

## 2018-11-23 ENCOUNTER — Other Ambulatory Visit: Payer: Self-pay | Admitting: Family Medicine

## 2018-11-23 ENCOUNTER — Ambulatory Visit
Admission: RE | Admit: 2018-11-23 | Discharge: 2018-11-23 | Disposition: A | Discharge status: Home / Self Care | Payer: BLUE CROSS/BLUE SHIELD | Source: Ambulatory Visit | Attending: Family Medicine | Admitting: Family Medicine

## 2018-11-23 DIAGNOSIS — R350 Frequency of micturition: Secondary | ICD-10-CM | POA: Diagnosis not present

## 2018-11-23 DIAGNOSIS — R0789 Other chest pain: Secondary | ICD-10-CM

## 2018-11-23 DIAGNOSIS — H938X3 Other specified disorders of ear, bilateral: Secondary | ICD-10-CM | POA: Diagnosis not present

## 2018-12-02 ENCOUNTER — Ambulatory Visit: Payer: BLUE CROSS/BLUE SHIELD | Admitting: Clinical

## 2018-12-02 DIAGNOSIS — F419 Anxiety disorder, unspecified: Secondary | ICD-10-CM | POA: Diagnosis not present

## 2018-12-14 DIAGNOSIS — N939 Abnormal uterine and vaginal bleeding, unspecified: Secondary | ICD-10-CM | POA: Diagnosis not present

## 2018-12-16 ENCOUNTER — Ambulatory Visit: Payer: BLUE CROSS/BLUE SHIELD | Admitting: Clinical

## 2018-12-20 DIAGNOSIS — Z87891 Personal history of nicotine dependence: Secondary | ICD-10-CM | POA: Diagnosis not present

## 2018-12-20 DIAGNOSIS — H9011 Conductive hearing loss, unilateral, right ear, with unrestricted hearing on the contralateral side: Secondary | ICD-10-CM | POA: Diagnosis not present

## 2018-12-20 DIAGNOSIS — J3089 Other allergic rhinitis: Secondary | ICD-10-CM | POA: Diagnosis not present

## 2018-12-20 DIAGNOSIS — H6983 Other specified disorders of Eustachian tube, bilateral: Secondary | ICD-10-CM | POA: Diagnosis not present

## 2018-12-21 ENCOUNTER — Ambulatory Visit: Payer: BLUE CROSS/BLUE SHIELD | Admitting: Clinical

## 2018-12-21 DIAGNOSIS — F419 Anxiety disorder, unspecified: Secondary | ICD-10-CM | POA: Diagnosis not present

## 2018-12-28 DIAGNOSIS — D320 Benign neoplasm of cerebral meninges: Secondary | ICD-10-CM | POA: Diagnosis not present

## 2018-12-30 ENCOUNTER — Other Ambulatory Visit: Payer: Self-pay

## 2018-12-30 ENCOUNTER — Ambulatory Visit (INDEPENDENT_AMBULATORY_CARE_PROVIDER_SITE_OTHER): Payer: BLUE CROSS/BLUE SHIELD | Admitting: Clinical

## 2018-12-30 DIAGNOSIS — F419 Anxiety disorder, unspecified: Secondary | ICD-10-CM

## 2019-01-13 ENCOUNTER — Ambulatory Visit (INDEPENDENT_AMBULATORY_CARE_PROVIDER_SITE_OTHER): Payer: BLUE CROSS/BLUE SHIELD | Admitting: Clinical

## 2019-01-13 DIAGNOSIS — F419 Anxiety disorder, unspecified: Secondary | ICD-10-CM

## 2019-01-25 DIAGNOSIS — R739 Hyperglycemia, unspecified: Secondary | ICD-10-CM | POA: Diagnosis not present

## 2019-01-25 DIAGNOSIS — K219 Gastro-esophageal reflux disease without esophagitis: Secondary | ICD-10-CM | POA: Diagnosis not present

## 2019-01-25 DIAGNOSIS — N898 Other specified noninflammatory disorders of vagina: Secondary | ICD-10-CM | POA: Diagnosis not present

## 2019-01-25 DIAGNOSIS — B379 Candidiasis, unspecified: Secondary | ICD-10-CM | POA: Diagnosis not present

## 2019-01-25 DIAGNOSIS — R7309 Other abnormal glucose: Secondary | ICD-10-CM | POA: Diagnosis not present

## 2019-01-30 ENCOUNTER — Ambulatory Visit (INDEPENDENT_AMBULATORY_CARE_PROVIDER_SITE_OTHER): Payer: BLUE CROSS/BLUE SHIELD | Admitting: Clinical

## 2019-01-30 DIAGNOSIS — F419 Anxiety disorder, unspecified: Secondary | ICD-10-CM

## 2019-02-10 ENCOUNTER — Ambulatory Visit: Payer: BLUE CROSS/BLUE SHIELD | Admitting: Clinical

## 2019-02-10 DIAGNOSIS — H6692 Otitis media, unspecified, left ear: Secondary | ICD-10-CM | POA: Diagnosis not present

## 2019-02-10 DIAGNOSIS — H6993 Unspecified Eustachian tube disorder, bilateral: Secondary | ICD-10-CM | POA: Diagnosis not present

## 2019-02-10 DIAGNOSIS — R51 Headache: Secondary | ICD-10-CM | POA: Diagnosis not present

## 2019-02-10 DIAGNOSIS — H8113 Benign paroxysmal vertigo, bilateral: Secondary | ICD-10-CM | POA: Diagnosis not present

## 2019-02-13 ENCOUNTER — Ambulatory Visit (INDEPENDENT_AMBULATORY_CARE_PROVIDER_SITE_OTHER): Payer: BLUE CROSS/BLUE SHIELD | Admitting: Clinical

## 2019-02-13 DIAGNOSIS — F419 Anxiety disorder, unspecified: Secondary | ICD-10-CM

## 2019-02-24 ENCOUNTER — Ambulatory Visit: Payer: BLUE CROSS/BLUE SHIELD | Admitting: Clinical

## 2019-02-25 DIAGNOSIS — H6592 Unspecified nonsuppurative otitis media, left ear: Secondary | ICD-10-CM | POA: Diagnosis not present

## 2019-02-25 DIAGNOSIS — H6993 Unspecified Eustachian tube disorder, bilateral: Secondary | ICD-10-CM | POA: Diagnosis not present

## 2019-02-27 ENCOUNTER — Ambulatory Visit (INDEPENDENT_AMBULATORY_CARE_PROVIDER_SITE_OTHER): Payer: BLUE CROSS/BLUE SHIELD | Admitting: Clinical

## 2019-02-27 DIAGNOSIS — F419 Anxiety disorder, unspecified: Secondary | ICD-10-CM | POA: Diagnosis not present

## 2019-03-10 ENCOUNTER — Ambulatory Visit: Payer: BLUE CROSS/BLUE SHIELD | Admitting: Clinical

## 2019-03-13 ENCOUNTER — Ambulatory Visit (INDEPENDENT_AMBULATORY_CARE_PROVIDER_SITE_OTHER): Payer: BLUE CROSS/BLUE SHIELD | Admitting: Clinical

## 2019-03-13 DIAGNOSIS — F419 Anxiety disorder, unspecified: Secondary | ICD-10-CM

## 2019-03-27 ENCOUNTER — Ambulatory Visit (INDEPENDENT_AMBULATORY_CARE_PROVIDER_SITE_OTHER): Payer: BC Managed Care – PPO | Admitting: Clinical

## 2019-03-27 DIAGNOSIS — F419 Anxiety disorder, unspecified: Secondary | ICD-10-CM

## 2019-03-29 DIAGNOSIS — R531 Weakness: Secondary | ICD-10-CM | POA: Diagnosis not present

## 2019-03-29 DIAGNOSIS — Z20828 Contact with and (suspected) exposure to other viral communicable diseases: Secondary | ICD-10-CM | POA: Diagnosis not present

## 2019-03-29 DIAGNOSIS — J029 Acute pharyngitis, unspecified: Secondary | ICD-10-CM | POA: Diagnosis not present

## 2019-03-29 DIAGNOSIS — R509 Fever, unspecified: Secondary | ICD-10-CM | POA: Diagnosis not present

## 2019-03-29 DIAGNOSIS — B349 Viral infection, unspecified: Secondary | ICD-10-CM | POA: Diagnosis not present

## 2019-04-04 DIAGNOSIS — H6983 Other specified disorders of Eustachian tube, bilateral: Secondary | ICD-10-CM | POA: Diagnosis not present

## 2019-04-10 ENCOUNTER — Ambulatory Visit (INDEPENDENT_AMBULATORY_CARE_PROVIDER_SITE_OTHER): Payer: BC Managed Care – PPO | Admitting: Clinical

## 2019-04-10 DIAGNOSIS — F419 Anxiety disorder, unspecified: Secondary | ICD-10-CM | POA: Diagnosis not present

## 2019-04-18 DIAGNOSIS — D320 Benign neoplasm of cerebral meninges: Secondary | ICD-10-CM | POA: Diagnosis not present

## 2019-04-21 ENCOUNTER — Encounter: Payer: Self-pay | Admitting: Gastroenterology

## 2019-04-23 ENCOUNTER — Ambulatory Visit (INDEPENDENT_AMBULATORY_CARE_PROVIDER_SITE_OTHER): Payer: BC Managed Care – PPO | Admitting: Clinical

## 2019-04-23 DIAGNOSIS — F419 Anxiety disorder, unspecified: Secondary | ICD-10-CM | POA: Diagnosis not present

## 2019-04-28 DIAGNOSIS — M25571 Pain in right ankle and joints of right foot: Secondary | ICD-10-CM | POA: Diagnosis not present

## 2019-04-28 DIAGNOSIS — R21 Rash and other nonspecific skin eruption: Secondary | ICD-10-CM | POA: Diagnosis not present

## 2019-04-28 DIAGNOSIS — G8929 Other chronic pain: Secondary | ICD-10-CM | POA: Diagnosis not present

## 2019-04-29 DIAGNOSIS — R21 Rash and other nonspecific skin eruption: Secondary | ICD-10-CM | POA: Diagnosis not present

## 2019-04-29 DIAGNOSIS — L309 Dermatitis, unspecified: Secondary | ICD-10-CM | POA: Diagnosis not present

## 2019-05-01 ENCOUNTER — Ambulatory Visit (INDEPENDENT_AMBULATORY_CARE_PROVIDER_SITE_OTHER): Payer: BC Managed Care – PPO | Admitting: Clinical

## 2019-05-01 DIAGNOSIS — F419 Anxiety disorder, unspecified: Secondary | ICD-10-CM

## 2019-05-06 DIAGNOSIS — J069 Acute upper respiratory infection, unspecified: Secondary | ICD-10-CM | POA: Diagnosis not present

## 2019-05-06 DIAGNOSIS — J029 Acute pharyngitis, unspecified: Secondary | ICD-10-CM | POA: Diagnosis not present

## 2019-05-06 DIAGNOSIS — Z20828 Contact with and (suspected) exposure to other viral communicable diseases: Secondary | ICD-10-CM | POA: Diagnosis not present

## 2019-05-06 DIAGNOSIS — R110 Nausea: Secondary | ICD-10-CM | POA: Diagnosis not present

## 2019-05-06 DIAGNOSIS — R0789 Other chest pain: Secondary | ICD-10-CM | POA: Diagnosis not present

## 2019-05-21 ENCOUNTER — Ambulatory Visit (INDEPENDENT_AMBULATORY_CARE_PROVIDER_SITE_OTHER): Payer: BC Managed Care – PPO | Admitting: Clinical

## 2019-05-21 DIAGNOSIS — F419 Anxiety disorder, unspecified: Secondary | ICD-10-CM | POA: Diagnosis not present

## 2019-05-26 ENCOUNTER — Ambulatory Visit: Payer: BC Managed Care – PPO

## 2019-05-26 VITALS — Ht 65.0 in | Wt 141.0 lb

## 2019-05-26 DIAGNOSIS — K900 Celiac disease: Secondary | ICD-10-CM

## 2019-05-26 NOTE — Progress Notes (Signed)
Per pt, no allergies to soy or egg products.Pt not taking any weight loss meds or using  O2 at home.  Pt denies any sedation problems.   The PV was done over the phone due to COVID-19. Verified pt's address and insurance. Reviewed pt's medical hx and prep instructions and will mai paperwork to pt today. Informed pt to call with any questions or changes prior to the procedure. Pt understood.

## 2019-06-04 ENCOUNTER — Ambulatory Visit (INDEPENDENT_AMBULATORY_CARE_PROVIDER_SITE_OTHER): Payer: BC Managed Care – PPO | Admitting: Clinical

## 2019-06-04 DIAGNOSIS — F419 Anxiety disorder, unspecified: Secondary | ICD-10-CM

## 2019-06-09 ENCOUNTER — Ambulatory Visit (AMBULATORY_SURGERY_CENTER): Payer: BC Managed Care – PPO | Admitting: Gastroenterology

## 2019-06-09 ENCOUNTER — Encounter: Payer: Self-pay | Admitting: Gastroenterology

## 2019-06-09 ENCOUNTER — Other Ambulatory Visit: Payer: Self-pay

## 2019-06-09 VITALS — BP 96/58 | HR 72 | Temp 97.8°F | Resp 23 | Ht 65.0 in | Wt 141.0 lb

## 2019-06-09 DIAGNOSIS — K900 Celiac disease: Secondary | ICD-10-CM

## 2019-06-09 DIAGNOSIS — K222 Esophageal obstruction: Secondary | ICD-10-CM | POA: Diagnosis not present

## 2019-06-09 DIAGNOSIS — Z8719 Personal history of other diseases of the digestive system: Secondary | ICD-10-CM | POA: Diagnosis not present

## 2019-06-09 MED ORDER — SODIUM CHLORIDE 0.9 % IV SOLN: 500.0000 mL | INTRAVENOUS | Status: DC

## 2019-06-09 NOTE — Progress Notes (Signed)
PT taken to PACU. Monitors in place. VSS. Report given to RN. 

## 2019-06-09 NOTE — Op Note (Signed)
[place] Patient Name: Mary Wyatt Procedure Date: 06/09/2019 7:43 AM MRN: [id number] Endoscopist: Ladene Artist , MD Age: 33 Referring MD:  Date of Birth: 1986/01/01 Gender: Female Account #: [id number] Procedure:                Upper GI endoscopy Indications:              Follow-up of celiac disease Medicines:                Monitored Anesthesia Care Procedure:                Pre-Anesthesia Assessment:                           - Prior to the procedure, a History and Physical                            was performed, and patient medications and                            allergies were reviewed. The patient's tolerance of                            previous anesthesia was also reviewed. The risks                            and benefits of the procedure and the sedation                            options and risks were discussed with the patient.                            All questions were answered, and informed consent                            was obtained. Prior Anticoagulants: The patient has                            taken no previous anticoagulant or antiplatelet                            agents. ASA Grade Assessment: II - A patient with                            mild systemic disease. After reviewing the risks                            and benefits, the patient was deemed in                            satisfactory condition to undergo the procedure.                           After obtaining informed consent, the endoscope was  passed under direct vision. Throughout the                            procedure, the patient's blood pressure, pulse, and                            oxygen saturations were monitored continuously. The                            Endoscope was introduced through the mouth, and                            advanced to the second part of duodenum. The upper                            GI endoscopy was  accomplished without difficulty.                            The patient tolerated the procedure well. Scope In: Scope Out: Findings:                 One benign-appearing, intrinsic mild stenosis was                            found 40 cm from the incisors. This stenosis                            measured 1.4 cm (inner diameter). The stenosis was                            traversed.                           The exam of the esophagus was otherwise normal.                           The entire examined stomach was normal.                           The duodenal bulb and second portion of the                            duodenum were normal. Biopsies for histology were                            taken with a cold forceps for evaluation of celiac                            disease. Complications:            No immediate complications. Estimated Blood Loss:     Estimated blood loss was minimal. Impression:               - Benign-appearing esophageal stenosis.                           -  Normal stomach.                           - Normal duodenal bulb and second portion of the                            duodenum. Biopsied. Recommendation:           - Patient has a contact number available for                            emergencies. The signs and symptoms of potential                            delayed complications were discussed with the                            patient. Return to normal activities tomorrow.                            Written discharge instructions were provided to the                            patient.                           - Resume previous diet.                           - Antireflux measures.                           - Continue present medications.                           - Await pathology results. Ladene Artist, MD 06/09/2019 8:21:56 AM This report has been signed electronically.

## 2019-06-09 NOTE — Patient Instructions (Signed)
YOU HAD AN ENDOSCOPIC PROCEDURE TODAY AT THE [facility]:   Refer to the procedure report that was given to you for any specific questions about what was found during the examination.  If the procedure report does not answer your questions, please call your gastroenterologist to clarify.  If you requested that your care partner not be given the details of your procedure findings, then the procedure report has been included in a sealed envelope for you to review at your convenience later.  YOU SHOULD EXPECT: Some feelings of bloating in the abdomen. Passage of more gas than usual.  Walking can help get rid of the air that was put into your GI tract during the procedure and reduce the bloating. If you had a lower endoscopy (such as a colonoscopy or flexible sigmoidoscopy) you may notice spotting of blood in your stool or on the toilet paper. If you underwent a bowel prep for your procedure, you may not have a normal bowel movement for a few days.  Please Note:  You might notice some irritation and congestion in your nose or some drainage.  This is from the oxygen used during your procedure.  There is no need for concern and it should clear up in a day or so.  SYMPTOMS TO REPORT IMMEDIATELY:   Following upper endoscopy (EGD)  Vomiting of blood or coffee ground material  New chest pain or pain under the shoulder blades  Painful or persistently difficult swallowing  New shortness of breath  Fever of 100F or higher  Black, tarry-looking stools  For urgent or emergent issues, a gastroenterologist can be reached at any hour by calling [phone].   DIET:  We do recommend a small meal at first, but then you may proceed to your regular diet.  Drink plenty of fluids but you should avoid alcoholic beverages for 24 hours.  ACTIVITY:  You should plan to take it easy for the rest of today and you should NOT DRIVE or use heavy machinery until tomorrow (because of the sedation medicines used  during the test).    FOLLOW UP: Our staff will call the number listed on your records 48-72 hours following your procedure to check on you and address any questions or concerns that you may have regarding the information given to you following your procedure. If we do not reach you, we will leave a message.  We will attempt to reach you two times.  During this call, we will ask if you have developed any symptoms of COVID 19. If you develop any symptoms (ie: fever, flu-like symptoms, shortness of breath, cough etc.) before then, please call [phone].  If you test positive for Covid 19 in the 2 weeks post procedure, please call and report this information to us.    If any biopsies were taken you will be contacted by phone or by letter within the next 1-3 weeks.  Please call us at [phone] if you have not heard about the biopsies in 3 weeks.    SIGNATURES/CONFIDENTIALITY: You and/or your care partner have signed paperwork which will be entered into your electronic medical record.  These signatures attest to the fact that that the information above on your After Visit Summary has been reviewed and is understood.  Full responsibility of the confidentiality of this discharge information lies with you and/or your care-partner. 

## 2019-06-09 NOTE — Progress Notes (Signed)
Called to room to assist during endoscopic procedure.  Patient ID and intended procedure confirmed with present staff. Received instructions for my participation in the procedure from the performing physician.  

## 2019-06-13 ENCOUNTER — Telehealth: Payer: Self-pay

## 2019-06-13 NOTE — Telephone Encounter (Signed)
  Follow up Call-  Call back number 06/09/2019 07/02/2018  Post procedure Call Back phone  # [phone]  Permission to leave phone message Yes Yes  Some recent data might be hidden     Patient questions:  Do you have a fever, pain , or abdominal swelling? No. Pain Score  0 *  Have you tolerated food without any problems? Yes.    Have you been able to return to your normal activities? Yes.    Do you have any questions about your discharge instructions: Diet   No. Medications  No. Follow up visit  No.  Do you have questions or concerns about your Care? No.  Actions: * If pain score is 4 or above: No action needed, pain <4.  1. Have you developed a fever since your procedure? no  2.   Have you had an respiratory symptoms (SOB or cough) since your procedure? no  3.   Have you tested positive for COVID 19 since your procedure no  4.   Have you had any family members/close contacts diagnosed with the COVID 19 since your procedure?  no   If yes to any of these questions please route to Joylene John, RN and Alphonsa Gin, Therapist, sports.

## 2019-06-13 NOTE — Telephone Encounter (Signed)
First attempt follow up call to check on pt, no answer, left message for pt to call if problems, otherwise we will try back this afternoon.

## 2019-06-16 DIAGNOSIS — R51 Headache: Secondary | ICD-10-CM | POA: Diagnosis not present

## 2019-06-16 DIAGNOSIS — Z87891 Personal history of nicotine dependence: Secondary | ICD-10-CM | POA: Diagnosis not present

## 2019-06-16 DIAGNOSIS — J029 Acute pharyngitis, unspecified: Secondary | ICD-10-CM | POA: Diagnosis not present

## 2019-06-16 DIAGNOSIS — M791 Myalgia, unspecified site: Secondary | ICD-10-CM | POA: Diagnosis not present

## 2019-06-16 DIAGNOSIS — R509 Fever, unspecified: Secondary | ICD-10-CM | POA: Diagnosis not present

## 2019-06-16 DIAGNOSIS — Z20828 Contact with and (suspected) exposure to other viral communicable diseases: Secondary | ICD-10-CM | POA: Diagnosis not present

## 2019-06-16 DIAGNOSIS — R0981 Nasal congestion: Secondary | ICD-10-CM | POA: Diagnosis not present

## 2019-06-16 DIAGNOSIS — B349 Viral infection, unspecified: Secondary | ICD-10-CM | POA: Diagnosis not present

## 2019-06-18 ENCOUNTER — Ambulatory Visit (INDEPENDENT_AMBULATORY_CARE_PROVIDER_SITE_OTHER): Payer: BC Managed Care – PPO | Admitting: Clinical

## 2019-06-18 DIAGNOSIS — F419 Anxiety disorder, unspecified: Secondary | ICD-10-CM | POA: Diagnosis not present

## 2019-06-20 ENCOUNTER — Ambulatory Visit: Payer: BC Managed Care – PPO | Admitting: Clinical

## 2019-06-27 ENCOUNTER — Encounter: Payer: Self-pay | Admitting: Gastroenterology

## 2019-07-02 ENCOUNTER — Ambulatory Visit (INDEPENDENT_AMBULATORY_CARE_PROVIDER_SITE_OTHER): Payer: BC Managed Care – PPO | Admitting: Clinical

## 2019-07-02 DIAGNOSIS — F411 Generalized anxiety disorder: Secondary | ICD-10-CM

## 2019-07-07 DIAGNOSIS — H52223 Regular astigmatism, bilateral: Secondary | ICD-10-CM | POA: Diagnosis not present

## 2019-07-19 ENCOUNTER — Ambulatory Visit (INDEPENDENT_AMBULATORY_CARE_PROVIDER_SITE_OTHER): Payer: BC Managed Care – PPO | Admitting: Clinical

## 2019-07-19 DIAGNOSIS — F419 Anxiety disorder, unspecified: Secondary | ICD-10-CM | POA: Diagnosis not present

## 2019-07-27 DIAGNOSIS — B349 Viral infection, unspecified: Secondary | ICD-10-CM | POA: Diagnosis not present

## 2019-07-27 DIAGNOSIS — J069 Acute upper respiratory infection, unspecified: Secondary | ICD-10-CM | POA: Diagnosis not present

## 2019-07-27 DIAGNOSIS — R0683 Snoring: Secondary | ICD-10-CM | POA: Diagnosis not present

## 2019-07-27 DIAGNOSIS — Z20828 Contact with and (suspected) exposure to other viral communicable diseases: Secondary | ICD-10-CM | POA: Diagnosis not present

## 2019-08-02 ENCOUNTER — Ambulatory Visit (INDEPENDENT_AMBULATORY_CARE_PROVIDER_SITE_OTHER): Payer: BC Managed Care – PPO | Admitting: Clinical

## 2019-08-02 DIAGNOSIS — F419 Anxiety disorder, unspecified: Secondary | ICD-10-CM

## 2019-08-03 NOTE — Telephone Encounter (Signed)
Referral notes faxed to [phone]

## 2019-08-04 DIAGNOSIS — Z113 Encounter for screening for infections with a predominantly sexual mode of transmission: Secondary | ICD-10-CM | POA: Diagnosis not present

## 2019-08-04 DIAGNOSIS — Z6824 Body mass index (BMI) 24.0-24.9, adult: Secondary | ICD-10-CM | POA: Diagnosis not present

## 2019-08-04 DIAGNOSIS — Z01419 Encounter for gynecological examination (general) (routine) without abnormal findings: Secondary | ICD-10-CM | POA: Diagnosis not present

## 2019-08-16 ENCOUNTER — Ambulatory Visit (INDEPENDENT_AMBULATORY_CARE_PROVIDER_SITE_OTHER): Payer: BC Managed Care – PPO | Admitting: Clinical

## 2019-08-16 DIAGNOSIS — F419 Anxiety disorder, unspecified: Secondary | ICD-10-CM

## 2019-08-23 ENCOUNTER — Ambulatory Visit (INDEPENDENT_AMBULATORY_CARE_PROVIDER_SITE_OTHER): Payer: BC Managed Care – PPO | Admitting: Clinical

## 2019-08-23 DIAGNOSIS — F419 Anxiety disorder, unspecified: Secondary | ICD-10-CM | POA: Diagnosis not present

## 2019-08-26 DIAGNOSIS — G4730 Sleep apnea, unspecified: Secondary | ICD-10-CM | POA: Diagnosis not present

## 2019-08-30 ENCOUNTER — Ambulatory Visit: Payer: BC Managed Care – PPO | Admitting: Clinical

## 2019-08-30 DIAGNOSIS — Z0000 Encounter for general adult medical examination without abnormal findings: Secondary | ICD-10-CM | POA: Diagnosis not present

## 2019-08-30 DIAGNOSIS — N809 Endometriosis, unspecified: Secondary | ICD-10-CM | POA: Diagnosis not present

## 2019-08-30 DIAGNOSIS — J3089 Other allergic rhinitis: Secondary | ICD-10-CM | POA: Diagnosis not present

## 2019-08-31 DIAGNOSIS — L821 Other seborrheic keratosis: Secondary | ICD-10-CM | POA: Diagnosis not present

## 2019-08-31 DIAGNOSIS — D225 Melanocytic nevi of trunk: Secondary | ICD-10-CM | POA: Diagnosis not present

## 2019-08-31 DIAGNOSIS — M25571 Pain in right ankle and joints of right foot: Secondary | ICD-10-CM | POA: Diagnosis not present

## 2019-08-31 DIAGNOSIS — D2230 Melanocytic nevi of unspecified part of face: Secondary | ICD-10-CM | POA: Diagnosis not present

## 2019-08-31 DIAGNOSIS — G8929 Other chronic pain: Secondary | ICD-10-CM | POA: Diagnosis not present

## 2019-08-31 DIAGNOSIS — M7671 Peroneal tendinitis, right leg: Secondary | ICD-10-CM | POA: Diagnosis not present

## 2019-08-31 DIAGNOSIS — L814 Other melanin hyperpigmentation: Secondary | ICD-10-CM | POA: Diagnosis not present

## 2019-09-05 ENCOUNTER — Ambulatory Visit (INDEPENDENT_AMBULATORY_CARE_PROVIDER_SITE_OTHER): Payer: BC Managed Care – PPO | Admitting: Clinical

## 2019-09-05 DIAGNOSIS — F419 Anxiety disorder, unspecified: Secondary | ICD-10-CM | POA: Diagnosis not present

## 2019-09-13 ENCOUNTER — Ambulatory Visit (INDEPENDENT_AMBULATORY_CARE_PROVIDER_SITE_OTHER): Payer: BC Managed Care – PPO | Admitting: Clinical

## 2019-09-13 DIAGNOSIS — F419 Anxiety disorder, unspecified: Secondary | ICD-10-CM | POA: Diagnosis not present

## 2019-09-27 ENCOUNTER — Ambulatory Visit: Payer: BC Managed Care – PPO | Admitting: Clinical

## 2019-10-04 DIAGNOSIS — R079 Chest pain, unspecified: Secondary | ICD-10-CM | POA: Diagnosis not present

## 2019-10-04 DIAGNOSIS — Z79899 Other long term (current) drug therapy: Secondary | ICD-10-CM | POA: Diagnosis not present

## 2019-10-04 DIAGNOSIS — F419 Anxiety disorder, unspecified: Secondary | ICD-10-CM | POA: Diagnosis not present

## 2019-10-04 DIAGNOSIS — K900 Celiac disease: Secondary | ICD-10-CM | POA: Diagnosis not present

## 2019-10-04 DIAGNOSIS — D802 Selective deficiency of immunoglobulin A [IgA]: Secondary | ICD-10-CM | POA: Diagnosis not present

## 2019-10-04 DIAGNOSIS — K222 Esophageal obstruction: Secondary | ICD-10-CM | POA: Diagnosis not present

## 2019-10-04 DIAGNOSIS — F329 Major depressive disorder, single episode, unspecified: Secondary | ICD-10-CM | POA: Diagnosis not present

## 2019-10-04 DIAGNOSIS — Z888 Allergy status to other drugs, medicaments and biological substances status: Secondary | ICD-10-CM | POA: Diagnosis not present

## 2019-10-07 DIAGNOSIS — Z780 Asymptomatic menopausal state: Secondary | ICD-10-CM | POA: Diagnosis not present

## 2019-10-07 DIAGNOSIS — K900 Celiac disease: Secondary | ICD-10-CM | POA: Diagnosis not present

## 2019-10-11 ENCOUNTER — Ambulatory Visit (INDEPENDENT_AMBULATORY_CARE_PROVIDER_SITE_OTHER): Payer: BC Managed Care – PPO | Admitting: Clinical

## 2019-10-11 DIAGNOSIS — F419 Anxiety disorder, unspecified: Secondary | ICD-10-CM | POA: Diagnosis not present

## 2019-10-13 DIAGNOSIS — G4730 Sleep apnea, unspecified: Secondary | ICD-10-CM | POA: Diagnosis not present

## 2019-10-25 ENCOUNTER — Ambulatory Visit (INDEPENDENT_AMBULATORY_CARE_PROVIDER_SITE_OTHER): Payer: BC Managed Care – PPO | Admitting: Clinical

## 2019-10-25 DIAGNOSIS — F419 Anxiety disorder, unspecified: Secondary | ICD-10-CM | POA: Diagnosis not present

## 2019-10-26 DIAGNOSIS — R3989 Other symptoms and signs involving the genitourinary system: Secondary | ICD-10-CM | POA: Diagnosis not present

## 2019-10-26 DIAGNOSIS — N809 Endometriosis, unspecified: Secondary | ICD-10-CM | POA: Diagnosis not present

## 2019-10-26 DIAGNOSIS — F419 Anxiety disorder, unspecified: Secondary | ICD-10-CM | POA: Diagnosis not present

## 2019-10-26 DIAGNOSIS — R350 Frequency of micturition: Secondary | ICD-10-CM | POA: Diagnosis not present

## 2019-10-31 DIAGNOSIS — M7918 Myalgia, other site: Secondary | ICD-10-CM | POA: Diagnosis not present

## 2019-10-31 DIAGNOSIS — N809 Endometriosis, unspecified: Secondary | ICD-10-CM | POA: Diagnosis not present

## 2019-10-31 DIAGNOSIS — Z86018 Personal history of other benign neoplasm: Secondary | ICD-10-CM | POA: Diagnosis not present

## 2019-10-31 DIAGNOSIS — R102 Pelvic and perineal pain: Secondary | ICD-10-CM | POA: Diagnosis not present

## 2019-11-08 ENCOUNTER — Ambulatory Visit (INDEPENDENT_AMBULATORY_CARE_PROVIDER_SITE_OTHER): Payer: BC Managed Care – PPO | Admitting: Clinical

## 2019-11-08 DIAGNOSIS — F419 Anxiety disorder, unspecified: Secondary | ICD-10-CM | POA: Diagnosis not present

## 2019-11-09 DIAGNOSIS — R300 Dysuria: Secondary | ICD-10-CM | POA: Diagnosis not present

## 2019-11-09 DIAGNOSIS — N898 Other specified noninflammatory disorders of vagina: Secondary | ICD-10-CM | POA: Diagnosis not present

## 2019-11-09 DIAGNOSIS — B373 Candidiasis of vulva and vagina: Secondary | ICD-10-CM | POA: Diagnosis not present

## 2019-11-10 ENCOUNTER — Ambulatory Visit: Payer: BC Managed Care – PPO | Admitting: Cardiology

## 2019-11-15 DIAGNOSIS — D320 Benign neoplasm of cerebral meninges: Secondary | ICD-10-CM | POA: Diagnosis not present

## 2019-11-15 DIAGNOSIS — D329 Benign neoplasm of meninges, unspecified: Secondary | ICD-10-CM | POA: Diagnosis not present

## 2019-11-18 DIAGNOSIS — K900 Celiac disease: Secondary | ICD-10-CM | POA: Diagnosis not present

## 2019-11-18 DIAGNOSIS — K222 Esophageal obstruction: Secondary | ICD-10-CM | POA: Diagnosis not present

## 2019-11-22 ENCOUNTER — Ambulatory Visit (INDEPENDENT_AMBULATORY_CARE_PROVIDER_SITE_OTHER): Payer: BC Managed Care – PPO | Admitting: Clinical

## 2019-11-22 DIAGNOSIS — F411 Generalized anxiety disorder: Secondary | ICD-10-CM | POA: Diagnosis not present

## 2019-11-23 DIAGNOSIS — N760 Acute vaginitis: Secondary | ICD-10-CM | POA: Diagnosis not present

## 2019-11-23 DIAGNOSIS — F329 Major depressive disorder, single episode, unspecified: Secondary | ICD-10-CM | POA: Diagnosis not present

## 2019-11-23 DIAGNOSIS — F419 Anxiety disorder, unspecified: Secondary | ICD-10-CM | POA: Diagnosis not present

## 2019-11-28 DIAGNOSIS — J0140 Acute pansinusitis, unspecified: Secondary | ICD-10-CM | POA: Diagnosis not present

## 2019-11-29 DIAGNOSIS — R42 Dizziness and giddiness: Secondary | ICD-10-CM | POA: Diagnosis not present

## 2019-11-29 DIAGNOSIS — R0609 Other forms of dyspnea: Secondary | ICD-10-CM | POA: Diagnosis not present

## 2019-11-29 DIAGNOSIS — R0789 Other chest pain: Secondary | ICD-10-CM | POA: Diagnosis not present

## 2019-12-05 DIAGNOSIS — N809 Endometriosis, unspecified: Secondary | ICD-10-CM | POA: Diagnosis not present

## 2019-12-05 DIAGNOSIS — N949 Unspecified condition associated with female genital organs and menstrual cycle: Secondary | ICD-10-CM | POA: Diagnosis not present

## 2019-12-05 DIAGNOSIS — G8929 Other chronic pain: Secondary | ICD-10-CM | POA: Diagnosis not present

## 2019-12-06 ENCOUNTER — Ambulatory Visit (INDEPENDENT_AMBULATORY_CARE_PROVIDER_SITE_OTHER): Payer: BC Managed Care – PPO | Admitting: Clinical

## 2019-12-06 DIAGNOSIS — F419 Anxiety disorder, unspecified: Secondary | ICD-10-CM

## 2019-12-09 DIAGNOSIS — R42 Dizziness and giddiness: Secondary | ICD-10-CM | POA: Diagnosis not present

## 2019-12-15 ENCOUNTER — Other Ambulatory Visit: Payer: Self-pay

## 2019-12-15 ENCOUNTER — Ambulatory Visit: Payer: BC Managed Care – PPO | Attending: Otolaryngology | Admitting: Physical Therapy

## 2019-12-15 ENCOUNTER — Encounter: Payer: Self-pay | Admitting: Physical Therapy

## 2019-12-15 VITALS — BP 124/69 | HR 82

## 2019-12-15 DIAGNOSIS — R2681 Unsteadiness on feet: Secondary | ICD-10-CM | POA: Diagnosis not present

## 2019-12-15 DIAGNOSIS — R42 Dizziness and giddiness: Secondary | ICD-10-CM

## 2019-12-15 DIAGNOSIS — R262 Difficulty in walking, not elsewhere classified: Secondary | ICD-10-CM | POA: Diagnosis not present

## 2019-12-15 NOTE — Patient Instructions (Addendum)
Gaze Stabilization: Standing Feet Apart    Feet shoulder width apart, keeping eyes on target on wall _6___ feet away, tilt head down 15-30 and move head side to side for __60__ seconds. Repeat while moving head up and down for _60__ seconds. Do __3-5_ sessions per day. Repeat using target on pattern background.      Gaze Stabilization: Tip Card  1.Target must remain in focus, not blurry, and appear stationary while head is in motion. 2.Perform exercises with small head movements (45 to either side of midline). 3.Increase speed of head motion so long as target is in focus. 4.If you wear eyeglasses, be sure you can see target through lens (therapist will give specific instructions for bifocal / progressive lenses). 5.These exercises may provoke dizziness or nausea. Work through these symptoms. If too dizzy, slow head movement slightly. Rest between each exercise. 6.Exercises demand concentration; avoid distractions. 7.For safety, perform standing exercises close to a counter, wall, corner, or next to someone.    SINGLE LIMB STANCE    Stance: single leg on floor. Raise leg. Hold _10-15__ seconds. Repeat with other leg. _1-2_ reps per set, 1-2___ sets per day, _5__ days per week   Tandem Stance    Right foot in front of left, heel touching toe both feet "straight ahead". Stand on Foot Triangle of Support with both feet. Balance in this position _30__ seconds. Do with left foot in front of right foot.

## 2019-12-16 NOTE — Therapy (Signed)
[facility] [address] [place], [place], [address] Phone: [phone]   Fax:  [phone]  Physical Therapy Evaluation  Patient Details  Name: Mary Wyatt MRN: [id number] Date of Birth: 28-Dec-1985 Referring Provider (PT): Lind Guest, MD   Encounter Date: 12/15/2019  PT End of Session - 12/16/19 1140    Visit Number  1    Number of Visits  6    Date for PT Re-Evaluation  01/27/20    Authorization Type  BCBS    PT Start Time  1150    PT Stop Time  1238    PT Time Calculation (min)  48 min    Activity Tolerance  Patient tolerated treatment well    Behavior During Therapy  [facility] for tasks assessed/performed       Past Medical History:  Diagnosis Date  . Abnormal EKG    Dec 2019 and Jan 2020/ saw cardiologist  . Allergy   . Anemia   . Anxiety   . Celiac disease   . Depression   . Genital warts   . GERD (gastroesophageal reflux disease)   . Interstitial cystitis   . Meningioma ([place])   . Post-operative nausea and vomiting   . RMSF [facility] spotted fever) 2015  . Ulcer     Past Surgical History:  Procedure Laterality Date  . COLONOSCOPY  2013  . CRANIOTOMY  09/2018  . laproscopic surgery     2013 and 2019  . OVARIAN CYST REMOVAL    . TONSILLECTOMY    . UPPER GASTROINTESTINAL ENDOSCOPY  2013    Vitals:   12/15/19 1223  BP: 124/69  Pulse: 82     Subjective Assessment - 12/15/19 1154    Subjective  Pt reports she was diagnosed with a meningioma in Dec. 2019 and underwent craniotomy (09/2018): states she had dizziness/light-headedess after this surgery as well as nausea; pt states she is having some light-headedness at this time; pt states she had episodes of vertigo last year but they would resolve; pt desribes it as her "head rushes" -  states she has fluttery feeling in her head since early Feb. - occurs almost daily - states she feels she has worse days; notices it more when she  gets up from sitting position; pt denies problems with vertigo while driving    Pertinent History  abnormal EKG Dec. 2019 & Jan. 2020:  Celiac disease:  depression & anxiety:  GERD:  meningioma with craniotomy 09/2018;  [facility] Spotted Fever 2015    Limitations  Standing;Walking;House hold activities    Patient Stated Goals  "make the vertigo stop"    Currently in Pain?  No/denies         [facility] PT Assessment - 12/16/19 0001      Assessment   Medical Diagnosis  Vertigo    Referring Provider (PT)  Lind Guest, MD    Onset Date/Surgical Date  --   June 2020 with gradual worsening in early Feb. 2021     Balance Screen   Has the patient fallen in the past 6 months  No    Has the patient had a decrease in activity level because of a fear of falling?   No    Is the patient reluctant to leave their home because of a fear of falling?   No      Observation/Other Assessments   Focus on Therapeutic Outcomes (FOTO)   Physical FS primary measure 97/100:  risk adjusted  statistical 63/100    Other Surveys   Dizziness Handicap Inventory [facility])    Dizziness Handicap Inventory (DHI)   12% (mild)           Vestibular Assessment - 12/16/19 0001      Vestibular Assessment   General Observation  pt is a 34 yr old lady with c/o vertigo that increases with standing/walking and also with sit to stand transfers; pt states duration and frequency has worsened in past 2-3 months        Symptom Behavior   Subjective history of current problem  pt rates dizziness intensity 5/10 at time of eval    Type of Dizziness   Lightheadedness;"Funny feeling in head"    Frequency of Dizziness  occcurs every day     Duration of Dizziness  varies - 15 secs to ongoing light-headedness    Symptom Nature  Motion provoked;Variable;Intermittent    Aggravating Factors  Spontaneous onset;Activity in general;Sit to stand;Forward bending    Relieving Factors  No known relieving factors    Progression of Symptoms   Worse      Oculomotor Exam   Oculomotor Alignment  Normal    Smooth Pursuits  Intact    Comment  pt reported increased symptoms with vertical smooth pursuits       Oculomotor Exam-Fixation Suppressed    Ocular Alignment  normal      Visual Acuity   Static  line 10    Dynamic  line 9 but pt reported incr. dizziness upon completion of test      Orthostatics   BP supine (x 5 minutes)  113/65    HR supine (x 5 minutes)  71    BP sitting  110/63    HR sitting  74    BP standing (after 1 minute)  107/75    HR standing (after 1 minute)  83    BP standing (after 3 minutes)  112/73    HR standing (after 3 minutes)  81          Objective measurements completed on examination: See above findings.              PT Education - 12/16/19 1139    Education Details  Balance exs - tandem and SLS: x1 viewing in standing    Person(s) Educated  Patient    Methods  Explanation;Demonstration;Handout    Comprehension  Verbalized understanding;Returned demonstration          PT Long Term Goals - 12/16/19 1157      PT LONG TERM GOAL #1   Title  Pt will improve DHI score from 12% to </= 6% to indicate improvement in dizziness for improved quality of life.    Baseline  DHI 12% - 12-15-19    Time  6    Period  Weeks    Status  New    Target Date  01/27/20      PT LONG TERM GOAL #2   Title  Improve SOT composite score by at least 15 points from baseline to demo improved balance and increased vestibular function/input in maintaining balance.    Time  6    Period  Weeks    Status  New    Target Date  01/27/20      PT LONG TERM GOAL #3   Title  Independent in HEP for balance and vestibular exercises.    Time  6    Period  Weeks    Status  New  Target Date  01/27/20             Plan - 12/16/19 1144    Clinical Impression Statement  Pt is a 34 yr old female with c/o episodic dizziness which she states increases with sit to stand and with walking.  Pt has recently  undergone cardiac work up with pt reporting she wore a heart monitor approx. 4 weeks ago and is currently waiting on results.  Pt states she has follow up appt. with cardiologist on 12-30-19; symptoms of dizziness appear to be possibly related to cardiac dysfunction as pt reports light-headedness with sit to stand transfers and also with walking, especially after walking a distance.  Pt also demonstrates some vestibular hypofunction as pt has postural sway with standing with EC; pt also demonstrates decreased VOR function with c/o incr. dizziness with x1 viewing exercise and with dynamic visual acuity testing.    Personal Factors and Comorbidities  Behavior Pattern;Comorbidity 2;Past/Current Experience;Time since onset of injury/illness/exacerbation;Social Background    Comorbidities  abnormal EKG Dec. 2019 & Jan. 2020:  Celiac disease:  depression & anxiety:  GERD:  meningioma with craniotomy 09/2018:  RMSF 2015    Examination-Activity Limitations  Stand;Bend;Locomotion Level;Stairs;Squat;Transfers    Examination-Participation Restrictions  Cleaning;Meal Prep;Community Activity;Shop    Stability/Clinical Decision Making  Evolving/Moderate complexity    Clinical Decision Making  Moderate    Rehab Potential  Good    PT Frequency  1x / week    PT Duration  6 weeks    PT Treatment/Interventions  ADLs/Self Care Home Management;Balance training;Neuromuscular re-education;Gait training;Therapeutic activities;Therapeutic exercise;Patient/family education;Vestibular    PT Next Visit Plan  do SOT - add to HEP prn    PT Home Exercise Plan  pt given tandem stance, SLS; x1 viewing in standing (12-15-19)    Consulted and Agree with Plan of Care  Patient       Patient will benefit from skilled therapeutic intervention in order to improve the following deficits and impairments:  Dizziness, Decreased balance, Difficulty walking  Visit Diagnosis: Dizziness and giddiness - Plan: PT plan of care  cert/re-cert  Difficulty in walking, not elsewhere classified - Plan: PT plan of care cert/re-cert  Unsteadiness on feet - Plan: PT plan of care cert/re-cert     Problem List Patient Active Problem List   Diagnosis Date Noted  . Anxiety and depression 06/15/2017  . Family history of brain aneurysm 04/19/2017  . Rhinitis, allergic 04/20/2014  . Generalized anxiety disorder 04/20/2014  . Celiac disease 12/15/2012  . Interstitial cystitis     Alda Lea, PT 12/16/2019, 12:12 PM  [facility] [address] [place], [place], [address] Phone: [phone]   Fax:  [phone]  Name: Mary Wyatt MRN: [id number] Date of Birth: [birth year]

## 2019-12-19 ENCOUNTER — Ambulatory Visit: Payer: BC Managed Care – PPO | Attending: Student | Admitting: Physical Therapy

## 2019-12-19 ENCOUNTER — Encounter: Payer: Self-pay | Admitting: Physical Therapy

## 2019-12-19 ENCOUNTER — Other Ambulatory Visit: Payer: Self-pay

## 2019-12-19 DIAGNOSIS — R42 Dizziness and giddiness: Secondary | ICD-10-CM | POA: Diagnosis not present

## 2019-12-19 DIAGNOSIS — R0600 Dyspnea, unspecified: Secondary | ICD-10-CM | POA: Diagnosis not present

## 2019-12-19 DIAGNOSIS — R252 Cramp and spasm: Secondary | ICD-10-CM | POA: Insufficient documentation

## 2019-12-19 DIAGNOSIS — R2681 Unsteadiness on feet: Secondary | ICD-10-CM | POA: Diagnosis not present

## 2019-12-19 DIAGNOSIS — M6281 Muscle weakness (generalized): Secondary | ICD-10-CM | POA: Insufficient documentation

## 2019-12-19 DIAGNOSIS — R262 Difficulty in walking, not elsewhere classified: Secondary | ICD-10-CM | POA: Insufficient documentation

## 2019-12-19 NOTE — Therapy (Signed)
[facility] [address] [place], [place], [address] Phone: [phone]   Fax:  [phone]  Physical Therapy Evaluation  Patient Details  Name: Mary Wyatt MRN: [id number] Date of Birth: [birth year] Referring Provider (PT): Dr. Molli Knock   Encounter Date: 12/19/2019  PT End of Session - 12/19/19 1528    Visit Number  1    Date for PT Re-Evaluation  03/12/20    Authorization Type  BCBS    Authorization - Visit Number  2   total with pelvic floor and Vertigo   Authorization - Number of Visits  30    PT Start Time  2423    PT Stop Time  1520    PT Time Calculation (min)  35 min    Activity Tolerance  Patient tolerated treatment well    Behavior During Therapy  [facility] for tasks assessed/performed       Past Medical History:  Diagnosis Date  . Abnormal EKG    Dec 2019 and Jan 2020/ saw cardiologist  . Allergy   . Anemia   . Anxiety   . Celiac disease   . Depression   . Genital warts   . GERD (gastroesophageal reflux disease)   . Interstitial cystitis   . Meningioma ([place])   . Post-operative nausea and vomiting   . RMSF [facility] spotted fever) 2015  . Ulcer     Past Surgical History:  Procedure Laterality Date  . COLONOSCOPY  2013  . CRANIOTOMY  09/2018  . laproscopic surgery     2013 and 2019  . OVARIAN CYST REMOVAL    . TONSILLECTOMY    . UPPER GASTROINTESTINAL ENDOSCOPY  2013    There were no vitals filed for this visit.   Subjective Assessment - 12/19/19 1452    Subjective  Patient reports her pelvic pain end of 2012 started with right lower abdominal pain. Since then found out she had endometriosis and several laproscopic. Patient has a few spots on her muscles causing the pain.    Pertinent History  abnormal EKG Dec. 2019 & Jan. 2020:  Celiac disease:  depression & anxiety:  GERD:  meningioma with craniotomy 09/2018;  [facility] Spotted Fever 2015    Patient Stated  Goals  reduce the pelvic floor pain.    Currently in Pain?  Yes    Pain Score  4     Pain Location  Abdomen    Pain Orientation  Right;Lower    Pain Descriptors / Indicators  Dull    Pain Type  Chronic pain    Pain Onset  More than a month ago    Pain Frequency  Constant    Aggravating Factors   cycle,    Pain Relieving Factors  heat    Multiple Pain Sites  No         [facility] PT Assessment - 12/19/19 0001      Assessment   Medical Diagnosis  M79.18 Myalgia of pelvic floor    Referring Provider (PT)  Dr. Molli Knock    Onset Date/Surgical Date  09/20/11    Prior Therapy  none for pelvic floor      Precautions   Precautions  None      Restrictions   Weight Bearing Restrictions  No      Balance Screen   Has the patient fallen in the past 6 months  No    Has the patient had a decrease in  activity level because of a fear of falling?   No    Is the patient reluctant to leave their home because of a fear of falling?   No      Home Film/video editor residence      Prior Function   Level of Independence  Independent    Leisure  walking      Cognition   Overall Cognitive Status  Within Functional Limits for tasks assessed      Posture/Postural Control   Posture/Postural Control  No significant limitations      ROM / Strength   AROM / PROM / Strength  AROM;PROM;Strength      AROM   Lumbar Extension  decreased by 25%    Lumbar - Right Side Bend  decreased by 25%    Lumbar - Left Side Bend  decreased by 25%      Strength   Right Hip Flexion  4/5    Right Hip External Rotation   4/5    Right Hip Internal Rotation  4/5    Right Hip ABduction  4+/5    Left Hip Flexion  4/5    Left Hip External Rotation  4/5    Left Hip Internal Rotation  4/5    Left Hip ABduction  4+/5      Palpation   SI assessment   ASIS are equal    Palpation comment  lower abdomen right > left; bil. gluteus medius, bil. levator ani with left tighter                 Objective measurements completed on examination: See above findings.    Pelvic Floor Special Questions - 12/19/19 0001    Prior Pregnancies  No    Currently Sexually Active  No    Urinary Leakage  Yes    Activities that cause leaking  Other    Other activities that cause leaking  dribble when getting off the commode    Urinary frequency  gets up 1 when asleep; anxious to fall asleep will constantly get up to go to the bathroom    Skin Integrity  Intact    External Palpation  tenderness located on left ischiocavernosus and bulbocavernosus    Pelvic Floor Internal Exam  Patient confirms identificaiton and approves PT to assess pelvic floor and treatment    Exam Type  Vaginal    Palpation  tenderness located on right obturator internist, levator ani, and urethra sphincter    Strength  --   did not assess strength due to tense muscles on right              PT Education - 12/19/19 1529    Education Details  instruction on how to perform soft tissue work to the right pelvic floor muscles and gave patient lubricant to use; discussed with patient to begin her meditation and to become more consistent with it    Person(s) Educated  Patient    Methods  Explanation    Comprehension  Verbalized understanding       PT Short Term Goals - 12/19/19 1526      PT SHORT TERM GOAL #1   Title  independent with initial HEP    Time  4    Period  Weeks    Status  New    Target Date  01/16/20      PT SHORT TERM GOAL #2   Title  instructed on how to massage  the pelvic floor muscles to reduce her pain    Time  4    Period  Weeks    Status  New    Target Date  01/16/20      PT SHORT TERM GOAL #3   Title  lower abdominal pain decreased </= 4/10 due to improve mobility of pelvic floor tissue    Time  4    Period  Weeks    Status  New    Target Date  01/16/20        PT Long Term Goals - 12/19/19 1516      PT LONG TERM GOAL #4   Title  independent with advanced  pelvic floor exericses    Time  12    Period  Weeks    Status  New    Target Date  03/12/20      PT LONG TERM GOAL #5   Title  pain with daily activities decreased </= 2/10 and intermittent due to decreased pelvic floor muscle tension    Time  12    Period  Weeks    Status  New    Target Date  03/12/20      Additional Long Term Goals   Additional Long Term Goals  Yes      PT LONG TERM GOAL #6   Title  after urinating and standing up no dribbling of urine due to improved pelvic floor coordination    Time  12    Period  Weeks    Status  New    Target Date  03/12/20             Plan - 12/19/19 1521    Clinical Impression Statement  Patient is a 34 year old female with chronic right lower abdominal pain that started in 2012. Patient has a hystory of endometriosis with 2 laproscopic surgery. Patient reports constant pain at level 6/10 that is affecting her quality of life. Patient reports she will dribble urine after she stands up from urinating. Patient has tenderness located on lower abdominal area with right worse than left. Palpable tenderness located in right obturator internist, levator ani, urethra sphincter and left ischiocavernosus and bulbocavernosus. PT did not assess pelvic floor strength due to increased muscle tension and pain on the right pelvic floor area. Patient bilateral hip strength is 4/5. Patient will benefit from skilled therapy to reduce her pain to improve quality of life.    Personal Factors and Comorbidities  Behavior Pattern;Comorbidity 2;Past/Current Experience;Time since onset of injury/illness/exacerbation;Social Background    Comorbidities  abnormal EKG Dec. 2019 & Jan. 2020:  Celiac disease:  depression & anxiety:  GERD:  meningioma with craniotomy 09/2018:  RMSF 2015    Examination-Activity Limitations  Stand;Bend;Locomotion Level;Stairs;Squat;Transfers    Examination-Participation Restrictions  Cleaning;Meal Prep;Community Activity;Shop     Stability/Clinical Decision Making  Evolving/Moderate complexity    Clinical Decision Making  Moderate    Rehab Potential  Good    PT Frequency  1x / week    PT Duration  12 weeks    PT Treatment/Interventions  Biofeedback;Cryotherapy;Electrical Stimulation;Ultrasound;Moist Heat;Neuromuscular re-education;Therapeutic activities;Patient/family education;Manual techniques;Dry needling    PT Next Visit Plan  internal soft tissue work; hip stretches; diaphragmatic breathing, soft tissue work to the lower abdoment, squat with breathing; patient will be going to neuro rehab for her Vertigo    Consulted and Agree with Plan of Care  Patient       Patient will benefit from skilled therapeutic intervention in order  to improve the following deficits and impairments:  Decreased coordination, Decreased range of motion, Increased fascial restricitons, Increased muscle spasms, Pain, Decreased strength  Visit Diagnosis: Muscle weakness (generalized) - Plan: PT plan of care cert/re-cert  Cramp and spasm - Plan: PT plan of care cert/re-cert     Problem List Patient Active Problem List   Diagnosis Date Noted  . Anxiety and depression 06/15/2017  . Family history of brain aneurysm 04/19/2017  . Rhinitis, allergic 04/20/2014  . Generalized anxiety disorder 04/20/2014  . Celiac disease 12/15/2012  . Interstitial cystitis     Earlie Counts, PT 12/19/19 3:32 PM   [facility] [address] [place], [place], [address] Phone: [phone]   Fax:  [phone]  Name: Mary Wyatt MRN: [id number] Date of Birth: 30-Jul-1986

## 2019-12-20 ENCOUNTER — Ambulatory Visit: Payer: BC Managed Care – PPO | Admitting: Physical Therapy

## 2019-12-20 ENCOUNTER — Ambulatory Visit (INDEPENDENT_AMBULATORY_CARE_PROVIDER_SITE_OTHER): Payer: BC Managed Care – PPO | Admitting: Clinical

## 2019-12-20 ENCOUNTER — Encounter: Payer: Self-pay | Admitting: Physical Therapy

## 2019-12-20 DIAGNOSIS — F419 Anxiety disorder, unspecified: Secondary | ICD-10-CM

## 2019-12-20 DIAGNOSIS — M6281 Muscle weakness (generalized): Secondary | ICD-10-CM | POA: Diagnosis not present

## 2019-12-20 DIAGNOSIS — R2681 Unsteadiness on feet: Secondary | ICD-10-CM

## 2019-12-20 DIAGNOSIS — R262 Difficulty in walking, not elsewhere classified: Secondary | ICD-10-CM

## 2019-12-20 DIAGNOSIS — R252 Cramp and spasm: Secondary | ICD-10-CM | POA: Diagnosis not present

## 2019-12-20 DIAGNOSIS — R42 Dizziness and giddiness: Secondary | ICD-10-CM | POA: Diagnosis not present

## 2019-12-20 NOTE — Therapy (Signed)
[facility] [address] [place], [place], [address] Phone: [phone]   Fax:  [phone]  Physical Therapy Treatment  Patient Details  Name: Mary Wyatt MRN: [id number] Date of Birth: Jul 23, 1986 Referring Provider (PT): Dr. Molli Knock   Encounter Date: 12/20/2019  PT End of Session - 12/20/19 1641    Visit Number  2    Number of Visits  6    Date for PT Re-Evaluation  01/27/20    Authorization Type  BCBS    Authorization - Visit Number  3   vertigo and pelivic visits combined   Authorization - Number of Visits  30    PT Start Time  1017    PT Stop Time  1058    PT Time Calculation (min)  41 min    Activity Tolerance  Patient tolerated treatment well;No increased pain    Behavior During Therapy  WFL for tasks assessed/performed       Past Medical History:  Diagnosis Date  . Abnormal EKG    Dec 2019 and Jan 2020/ saw cardiologist  . Allergy   . Anemia   . Anxiety   . Celiac disease   . Depression   . Genital warts   . GERD (gastroesophageal reflux disease)   . Interstitial cystitis   . Meningioma ([place])   . Post-operative nausea and vomiting   . RMSF [facility] spotted fever) 2015  . Ulcer     Past Surgical History:  Procedure Laterality Date  . COLONOSCOPY  2013  . CRANIOTOMY  09/2018  . laproscopic surgery     2013 and 2019  . OVARIAN CYST REMOVAL    . TONSILLECTOMY    . UPPER GASTROINTESTINAL ENDOSCOPY  2013    There were no vitals filed for this visit.  Subjective Assessment - 12/20/19 1017    Subjective  No new complaitns. No falls. Thinks the dizziness is a little better today, 2/10 currently. Did have a "bad spell" over the weekend while sitting still, 5-6/10. Was scrolling on her phone and is weaning off her anxiety meds, which could have contributed to this.    Pertinent History  abnormal EKG Dec. 2019 & Jan. 2020, had a normal EKG in Feb 2021:  Celiac disease:   depression & anxiety:  GERD:  meningioma with craniotomy 09/2018;  [facility] Spotted Fever 2015    Limitations  Standing;Walking;House hold activities    Patient Stated Goals  make the "vertigo" stop      Treatment: Neuro re-ed: sensory organization test performed with following results: Conditions: 1: above, below, below 2: all above 3: below, above, above 4: all above 5: above, below, above 6: above, above, below Composite score: 69, below norm Sensory Analysis Som: above Vis: above Vest: above (just barely) Pref: above Strategy analysis: none       COG alignment: posterior>right  Issued the following to HEP:  Access Code: [email or web address]  URL: [email or web address]  Date: 12/20/2019  Prepared by: Willow Ora   Exercises Issued at eval Standing Single Leg Stance with Counter Support - 3 reps - 1 sets - 15 hold - 1x daily - 5x weekly Standing Tandem Balance with Counter Support - 3 reps - 1 sets - 15 hold - 1x daily - 5x weekly Standing Gaze Stabilization with Head Rotation - 10 reps - 1 sets - 1x daily - 5x weekly  Issued today with min guard assist, cues on form and technique.  Romberg Stance Eyes  Closed on Foam Pad - 3 reps - 1 sets - 30 hold - 1x daily - 5x weekly Wide Stance with Eyes Closed and Head Rotation on Foam Pad - 10 reps - 1 sets - 1x daily - 5x weekly       PT Education - 12/20/19 1651    Education Details  results of SOT, additions to HEP    Person(s) Educated  Patient    Methods  Explanation;Demonstration;Verbal cues    Comprehension  Verbalized understanding;Returned demonstration;Verbal cues required;Need further instruction       PT Short Term Goals - 12/19/19 1526      PT SHORT TERM GOAL #1   Title  independent with initial HEP    Time  4    Period  Weeks    Status  New    Target Date  01/16/20      PT SHORT TERM GOAL #2   Title  instructed on how to massage the pelvic floor muscles to reduce her pain    Time  4     Period  Weeks    Status  New    Target Date  01/16/20      PT SHORT TERM GOAL #3   Title  lower abdominal pain decreased </= 4/10 due to improve mobility of pelvic floor tissue    Time  4    Period  Weeks    Status  New    Target Date  01/16/20        PT Long Term Goals - 12/19/19 1516      PT LONG TERM GOAL #4   Title  independent with advanced pelvic floor exericses    Time  12    Period  Weeks    Status  New    Target Date  03/12/20      PT LONG TERM GOAL #5   Title  pain with daily activities decreased </= 2/10 and intermittent due to decreased pelvic floor muscle tension    Time  12    Period  Weeks    Status  New    Target Date  03/12/20      Additional Long Term Goals   Additional Long Term Goals  Yes      PT LONG TERM GOAL #6   Title  after urinating and standing up no dribbling of urine due to improved pelvic floor coordination    Time  12    Period  Weeks    Status  New    Target Date  03/12/20            Plan - 12/20/19 1643    Clinical Impression Statement  Today's skilled session focused on performance of Sensory Organization Test to further assess balance deficits. Additions added to HEP afterwards with pt reporting a "funny" feeling in her head that cleared with rest breaks. The pt is progressing and should benefit from continued PT to progress toward unmet goals.    Personal Factors and Comorbidities  Behavior Pattern;Comorbidity 2;Past/Current Experience;Time since onset of injury/illness/exacerbation;Social Background    Comorbidities  abnormal EKG Dec. 2019 & Jan. 2020:  Celiac disease:  depression & anxiety:  GERD:  meningioma with craniotomy 09/2018:  RMSF 2015    Examination-Activity Limitations  Stand;Bend;Locomotion Level;Stairs;Squat;Transfers    Examination-Participation Restrictions  Cleaning;Meal Prep;Community Activity;Shop    Stability/Clinical Decision Making  Evolving/Moderate complexity    Rehab Potential  Good    PT Frequency   1x / week  PT Duration  6 weeks    PT Treatment/Interventions  ADLs/Self Care Home Management;Balance training;Neuromuscular re-education;Gait training;Therapeutic activities;Therapeutic exercise;Patient/family education;Vestibular    PT Next Visit Plan  continue to work on balance with increased vestibular system imput, advance HEP as indicated    PT Home Exercise Plan  Access Code: [email or web address]    Consulted and Agree with Plan of Care  Patient       Patient will benefit from skilled therapeutic intervention in order to improve the following deficits and impairments:  Dizziness, Decreased balance, Difficulty walking  Visit Diagnosis: Dizziness and giddiness  Difficulty in walking, not elsewhere classified  Unsteadiness on feet     Problem List Patient Active Problem List   Diagnosis Date Noted  . Anxiety and depression 06/15/2017  . Family history of brain aneurysm 04/19/2017  . Rhinitis, allergic 04/20/2014  . Generalized anxiety disorder 04/20/2014  . Celiac disease 12/15/2012  . Interstitial cystitis     Willow Ora, PTA, [facility] [address], [place], [place] [address] [phone] 12/20/19, 4:52 PM   Name: Mary Wyatt MRN: [id number] Date of Birth: [birth year]

## 2019-12-20 NOTE — Patient Instructions (Signed)
Access Code: [email or web address]  URL: [email or web address]  Date: 12/20/2019  Prepared by: Willow Ora   Exercises Standing Single Leg Stance with Counter Support - 3 reps - 1 sets - 15 hold - 1x daily - 5x weekly Standing Tandem Balance with Counter Support - 3 reps - 1 sets - 15 hold - 1x daily - 5x weekly Standing Gaze Stabilization with Head Rotation - 10 reps - 1 sets - 1x daily - 5x weekly Romberg Stance Eyes Closed on Foam Pad - 3 reps - 1 sets - 30 hold - 1x daily - 5x weekly Wide Stance with Eyes Closed and Head Rotation on Foam Pad - 10 reps - 1 sets - 1x daily - 5x weekly

## 2019-12-23 DIAGNOSIS — I471 Supraventricular tachycardia: Secondary | ICD-10-CM | POA: Diagnosis not present

## 2019-12-26 ENCOUNTER — Ambulatory Visit (INDEPENDENT_AMBULATORY_CARE_PROVIDER_SITE_OTHER): Payer: BC Managed Care – PPO | Admitting: Clinical

## 2019-12-26 DIAGNOSIS — F419 Anxiety disorder, unspecified: Secondary | ICD-10-CM | POA: Diagnosis not present

## 2019-12-27 ENCOUNTER — Encounter: Payer: Self-pay | Admitting: Physical Therapy

## 2019-12-27 ENCOUNTER — Ambulatory Visit: Payer: BC Managed Care – PPO | Attending: Student | Admitting: Physical Therapy

## 2019-12-27 ENCOUNTER — Ambulatory Visit: Payer: BC Managed Care – PPO | Admitting: Physical Therapy

## 2019-12-27 ENCOUNTER — Other Ambulatory Visit: Payer: Self-pay

## 2019-12-27 DIAGNOSIS — R262 Difficulty in walking, not elsewhere classified: Secondary | ICD-10-CM | POA: Diagnosis not present

## 2019-12-27 DIAGNOSIS — R2681 Unsteadiness on feet: Secondary | ICD-10-CM | POA: Diagnosis not present

## 2019-12-27 DIAGNOSIS — M6281 Muscle weakness (generalized): Secondary | ICD-10-CM | POA: Insufficient documentation

## 2019-12-27 DIAGNOSIS — R42 Dizziness and giddiness: Secondary | ICD-10-CM | POA: Diagnosis not present

## 2019-12-27 DIAGNOSIS — R252 Cramp and spasm: Secondary | ICD-10-CM | POA: Insufficient documentation

## 2019-12-27 NOTE — Patient Instructions (Signed)
Access Code: [email or web address] URL: [email or web address] Date: 12/27/2019 Prepared by: Earlie Counts  Exercises Standing Single Leg Stance with Counter Support - 3 reps - 1 sets - 15 hold - 1x daily - 5x weekly Standing Tandem Balance with Counter Support - 3 reps - 1 sets - 15 hold - 1x daily - 5x weekly Standing Gaze Stabilization with Head Rotation - 10 reps - 1 sets - 1x daily - 5x weekly Romberg Stance Eyes Closed on Foam Pad - 3 reps - 1 sets - 30 hold - 1x daily - 5x weekly Wide Stance with Eyes Closed and Head Rotation on Foam Pad - 10 reps - 1 sets - 1x daily - 5x weekly Seated Piriformis Stretch with Trunk Bend - 2 reps - 1 sets - 30 sec hold - 1x daily - 7x weekly Seated Hamstring Stretch - 2 reps - 1 sets - 30 sec hold - 1x daily - 7x weekly Prone Press Up - 5 reps - 1 sets - 5 sec hold - 1x daily - 7x weekly Cat-Camel - 10 reps - 1 sets - 1x daily - 7x weekly Child's Pose Stretch - 1 reps - 1 sets - 30 sec hold - 1x daily - 7x weekly Supine Pelvic Floor Stretch - 1 reps - 1 sets - 1-2 min hold - 1x daily - 7x weekly Supine Abdominal Wall Massage - 1 reps - 1 sets - 5 min hold - 1x daily - 7x weekly [facility] [address] [place], [place] [address] Phone # [phone] Fax [phone]

## 2019-12-27 NOTE — Therapy (Signed)
[facility] [address], [place], [place], [address] Phone: [phone]   Fax:  [phone]  Physical Therapy Treatment  Patient Details  Name: Mary Wyatt MRN: [id number] Date of Birth: [birth year] Referring Provider (PT): Dr. Molli Knock   Encounter Date: 12/27/2019  PT End of Session - 12/27/19 0842    Visit Number  2   pelvic floor   Date for PT Re-Evaluation  03/12/20    Authorization Type  BCBS    Authorization - Visit Number  4   vertigo and pelvic floor visits   Authorization - Number of Visits  30    PT Start Time  0800    PT Stop Time  0840    PT Time Calculation (min)  40 min    Activity Tolerance  Patient tolerated treatment well;No increased pain    Behavior During Therapy  WFL for tasks assessed/performed       Past Medical History:  Diagnosis Date  . Abnormal EKG    Dec 2019 and Jan 2020/ saw cardiologist  . Allergy   . Anemia   . Anxiety   . Celiac disease   . Depression   . Genital warts   . GERD (gastroesophageal reflux disease)   . Interstitial cystitis   . Meningioma ([place])   . Post-operative nausea and vomiting   . RMSF [facility] spotted fever) 2015  . Ulcer     Past Surgical History:  Procedure Laterality Date  . COLONOSCOPY  2013  . CRANIOTOMY  09/2018  . laproscopic surgery     2013 and 2019  . OVARIAN CYST REMOVAL    . TONSILLECTOMY    . UPPER GASTROINTESTINAL ENDOSCOPY  2013    There were no vitals filed for this visit.  Subjective Assessment - 12/27/19 0809    Subjective  I was in alot of pain after the eval and into the next day. I did the massage and feels like I am alleviating something.    Pertinent History  abnormal EKG Dec. 2019 & Jan. 2020, had a normal EKG in Feb 2021:  Celiac disease:  depression & anxiety:  GERD:  meningioma with craniotomy 09/2018;  [facility] Spotted Fever 2015    Limitations  Standing;Walking;House hold  activities    Patient Stated Goals  reduce pelvic floor pain    Currently in Pain?  Yes    Pain Score  3     Pain Location  Abdomen    Pain Orientation  Right;Lower    Pain Descriptors / Indicators  Dull    Pain Type  Chronic pain    Pain Onset  More than a month ago    Pain Frequency  Constant    Aggravating Factors   cycle    Pain Relieving Factors  heat    Multiple Pain Sites  No                       [facility] Adult PT Treatment/Exercise - 12/27/19 0001      Neuro Re-ed    Neuro Re-ed Details   breathing into the abdomen with tactile cues then breathing into the lateral rib cage with tactile cues      Lumbar Exercises: Stretches   Active Hamstring Stretch  Right;Left;1 rep;30 seconds    Active Hamstring Stretch Limitations  sitting    Press Ups  5 reps;5 seconds    Quadruped Mid Back Stretch  1 rep;60  seconds    Quad Stretch  Right;Left;1 rep;60 seconds    Quad Stretch Limitations  foam roll    ITB Stretch  Right;Left;1 rep;60 seconds    ITB Stretch Limitations  foam roll    Piriformis Stretch  Right;Left;1 rep;60 seconds   30 sec both legs sitting   Piriformis Stretch Limitations  foam roll    Other Lumbar Stretch Exercise  happy baby with breath      Lumbar Exercises: Quadruped   [place]/Old Horse  10 reps      Manual Therapy   Manual Therapy  Myofascial release;Soft tissue mobilization    Soft tissue mobilization  circular massage to improve tissue mobility and improve peristalic motion of intestines    Myofascial Release  release to the umbilicus to reduce fascial tension             PT Education - 12/27/19 0840    Education Details  Access Code: [email or web address]    Person(s) Educated  Patient    Methods  Explanation;Demonstration;Verbal cues;Handout    Comprehension  Verbalized understanding;Returned demonstration       PT Short Term Goals - 12/19/19 1526      PT SHORT TERM GOAL #1   Title  independent with initial HEP    Time  4    Period   Weeks    Status  New    Target Date  01/16/20      PT SHORT TERM GOAL #2   Title  instructed on how to massage the pelvic floor muscles to reduce her pain    Time  4    Period  Weeks    Status  New    Target Date  01/16/20      PT SHORT TERM GOAL #3   Title  lower abdominal pain decreased </= 4/10 due to improve mobility of pelvic floor tissue    Time  4    Period  Weeks    Status  New    Target Date  01/16/20        PT Long Term Goals - 12/19/19 1516      PT LONG TERM GOAL #4   Title  independent with advanced pelvic floor exericses    Time  12    Period  Weeks    Status  New    Target Date  03/12/20      PT LONG TERM GOAL #5   Title  pain with daily activities decreased </= 2/10 and intermittent due to decreased pelvic floor muscle tension    Time  12    Period  Weeks    Status  New    Target Date  03/12/20      Additional Long Term Goals   Additional Long Term Goals  Yes      PT LONG TERM GOAL #6   Title  after urinating and standing up no dribbling of urine due to improved pelvic floor coordination    Time  12    Period  Weeks    Status  New    Target Date  03/12/20            Plan - 12/27/19 0818    Clinical Impression Statement  Patient had increased pain after the initial eval for pelvic floor. Patient has fascial tightness in the abdomen especially around the umbilicus. Patient does well with the meditation. Patient is doing her pelvic floor soft tissue work at home. Patient is able to demonstrate correct breathing  into the abdomen then into the lower rib cage. Patient will from skilled therapy to reduce her pain to improve quality of life.    Personal Factors and Comorbidities  Behavior Pattern;Comorbidity 2;Past/Current Experience;Time since onset of injury/illness/exacerbation;Social Background    Comorbidities  abnormal EKG Dec. 2019 & Jan. 2020:  Celiac disease:  depression & anxiety:  GERD:  meningioma with craniotomy 09/2018:  RMSF 2015     Examination-Activity Limitations  Stand;Bend;Locomotion Level;Stairs;Squat;Transfers    Stability/Clinical Decision Making  Evolving/Moderate complexity    Clinical Decision Making  Moderate    Rehab Potential  Good    PT Frequency  1x / week   pelvic   PT Duration  12 weeks   pelvic   PT Treatment/Interventions  Neuromuscular re-education;Therapeutic activities;Therapeutic exercise;Patient/family education;Biofeedback;Cryotherapy;Electrical Stimulation;Ultrasound;Moist Heat;Manual techniques;Dry needling    PT Next Visit Plan  abdominal work, squat with breath, soft tissue work to the inner thighs, foam roll    PT Home Exercise Plan  Access Code: [email or web address]    Consulted and Agree with Plan of Care  Patient       Patient will benefit from skilled therapeutic intervention in order to improve the following deficits and impairments:  Decreased coordination, Decreased range of motion, Increased fascial restricitons, Increased muscle spasms, Pain, Decreased strength  Visit Diagnosis: Cramp and spasm  Muscle weakness (generalized)     Problem List Patient Active Problem List   Diagnosis Date Noted  . Anxiety and depression 06/15/2017  . Family history of brain aneurysm 04/19/2017  . Rhinitis, allergic 04/20/2014  . Generalized anxiety disorder 04/20/2014  . Celiac disease 12/15/2012  . Interstitial cystitis     Earlie Counts, PT 12/27/19 8:48 AM   [facility] [address] [place], [place], [address] Phone: [phone]   Fax:  [phone]  Name: DANYELLA MCGINTY MRN: [id number] Date of Birth: [birth year]

## 2019-12-28 NOTE — Therapy (Signed)
[facility] [address] [place], [place], [address] Phone: [phone]   Fax:  [phone]  Physical Therapy Treatment  Patient Details  Name: Mary Wyatt MRN: [id number] Date of Birth: 18-Sep-1986 Referring Provider (PT): Dr. Molli Knock   Encounter Date: 12/27/2019  PT End of Session - 12/28/19 2134    Visit Number  3   3rd visit for vestibular rehab   Number of Visits  6    Date for PT Re-Evaluation  01/27/20    Authorization Type  BCBS    Authorization - Visit Number  5   vertigo and pelvic floor visits   Authorization - Number of Visits  30    PT Start Time  7062    PT Stop Time  1315    PT Time Calculation (min)  45 min    Activity Tolerance  Patient tolerated treatment well;No increased pain    Behavior During Therapy  WFL for tasks assessed/performed       Past Medical History:  Diagnosis Date  . Abnormal EKG    Dec 2019 and Jan 2020/ saw cardiologist  . Allergy   . Anemia   . Anxiety   . Celiac disease   . Depression   . Genital warts   . GERD (gastroesophageal reflux disease)   . Interstitial cystitis   . Meningioma ([place])   . Post-operative nausea and vomiting   . RMSF [facility] spotted fever) 2015  . Ulcer     Past Surgical History:  Procedure Laterality Date  . COLONOSCOPY  2013  . CRANIOTOMY  09/2018  . laproscopic surgery     2013 and 2019  . OVARIAN CYST REMOVAL    . TONSILLECTOMY    . UPPER GASTROINTESTINAL ENDOSCOPY  2013    There were no vitals filed for this visit.                         Balance Exercises - 12/28/19 2126      Balance Exercises: Standing   Standing Eyes Opened  Narrow base of support (BOS);Wide (BOA);Head turns;Foam/compliant surface;5 reps    Standing Eyes Closed  Narrow base of support (BOS);Wide (BOA);Head turns;Foam/compliant surface;5 reps    Rockerboard  Anterior/posterior;EO;EC;10 reps   with UE support  prn   Other Standing Exercises  pt performed amb. tossing ball straight up in front, then tossing and catching on Rt/Lt sides for approx. 35' x 2 reps;  pt performed amb. visuall tracking ball in various directions - clockwise, counterclockwise, "V" and "+" patterns 35' x 1 rep each pattern     Other Standing Exercises Comments  Pt performed marching on incline - focusing straight ahead, progressing to marching with EC , and marching with EO with head turns hoizontal and vertical 5 reps each         PT Education - 12/28/19 2131    Education Details  modified x1 viewing to increase time to 60 secs horizontally & vertically;  added head turns with targets with standing on compliant surface with EO    Person(s) Educated  Patient    Methods  Explanation;Demonstration    Comprehension  Verbalized understanding       PT Short Term Goals - 12/19/19 1526      PT SHORT TERM GOAL #1   Title  independent with initial HEP    Time  4    Period  Weeks    Status  New    Target Date  01/16/20      PT SHORT TERM GOAL #2   Title  instructed on how to massage the pelvic floor muscles to reduce her pain    Time  4    Period  Weeks    Status  New    Target Date  01/16/20      PT SHORT TERM GOAL #3   Title  lower abdominal pain decreased </= 4/10 due to improve mobility of pelvic floor tissue    Time  4    Period  Weeks    Status  New    Target Date  01/16/20        PT Long Term Goals - 12/28/19 2141      PT LONG TERM GOAL #1   Title  Pt will improve DHI score from 12% to </= 6% to indicate improvement in dizziness for improved quality of life.    Baseline  DHI 12% - 12-15-19    Time  6    Period  Weeks    Status  New      PT LONG TERM GOAL #2   Title  Improve SOT composite score by at least 15 points from baseline to demo improved balance and increased vestibular function/input in maintaining balance.    Time  6    Period  Weeks    Status  New      PT LONG TERM GOAL #3   Title   Independent in HEP for balance and vestibular exercises.    Time  6    Period  Weeks    Status  New            Plan - 12/28/19 2136    Clinical Impression Statement  Pt reports increased dizziness with dynamic gait activities with horizontal and vertical head turns, with pt reporting > dizziness provoked with vertical head turns than with horizontal.  Dizziness appears to be multi-factorial in etiology with pt reporting improvement in dizziness since she stopped taking Xanax medication, suggestive of side effect of dizziness produced by medication; pt also presents with possible vestibular hypofunction as pt reports dizziness with head turns with dynamic gait activities.    Personal Factors and Comorbidities  Behavior Pattern;Comorbidity 2;Past/Current Experience;Time since onset of injury/illness/exacerbation;Social Background    Comorbidities  abnormal EKG Dec. 2019 & Jan. 2020:  Celiac disease:  depression & anxiety:  GERD:  meningioma with craniotomy 09/2018:  RMSF 2015    Examination-Activity Limitations  Stand;Bend;Locomotion Level;Stairs;Squat;Transfers    Stability/Clinical Decision Making  Evolving/Moderate complexity    Rehab Potential  Good    PT Frequency  1x / week   pelvic   PT Duration  12 weeks   pelvic   PT Treatment/Interventions  Neuromuscular re-education;Therapeutic activities;Therapeutic exercise;Patient/family education;Biofeedback;Cryotherapy;Electrical Stimulation;Ultrasound;Moist Heat;Manual techniques;Dry needling    PT Next Visit Plan  continue with vestibular/dynamic gait activities with head turns    PT Home Exercise Plan  Access Code: [email or web address]    Consulted and Agree with Plan of Care  Patient       Patient will benefit from skilled therapeutic intervention in order to improve the following deficits and impairments:  Decreased coordination, Decreased range of motion, Increased fascial restricitons, Increased muscle spasms, Pain, Decreased strength  Visit  Diagnosis: Dizziness and giddiness  Unsteadiness on feet     Problem List Patient Active Problem List   Diagnosis Date Noted  . Anxiety and depression 06/15/2017  . Family history of brain  aneurysm 04/19/2017  . Rhinitis, allergic 04/20/2014  . Generalized anxiety disorder 04/20/2014  . Celiac disease 12/15/2012  . Interstitial cystitis     Alda Lea, PT 12/28/2019, 9:43 PM  [facility] [address] [place], [place], [address] Phone: [phone]   Fax:  [phone]  Name: Mary Wyatt MRN: [id number] Date of Birth: 03-25-1986

## 2019-12-29 DIAGNOSIS — R0789 Other chest pain: Secondary | ICD-10-CM | POA: Diagnosis not present

## 2019-12-29 DIAGNOSIS — R0600 Dyspnea, unspecified: Secondary | ICD-10-CM | POA: Diagnosis not present

## 2019-12-30 DIAGNOSIS — Z20828 Contact with and (suspected) exposure to other viral communicable diseases: Secondary | ICD-10-CM | POA: Diagnosis not present

## 2019-12-30 DIAGNOSIS — Z20822 Contact with and (suspected) exposure to covid-19: Secondary | ICD-10-CM | POA: Diagnosis not present

## 2020-01-02 ENCOUNTER — Encounter: Payer: Self-pay | Admitting: Physical Therapy

## 2020-01-02 ENCOUNTER — Ambulatory Visit: Payer: BC Managed Care – PPO | Admitting: Physical Therapy

## 2020-01-02 ENCOUNTER — Other Ambulatory Visit: Payer: Self-pay

## 2020-01-02 DIAGNOSIS — M6281 Muscle weakness (generalized): Secondary | ICD-10-CM | POA: Diagnosis not present

## 2020-01-02 DIAGNOSIS — R252 Cramp and spasm: Secondary | ICD-10-CM

## 2020-01-02 NOTE — Therapy (Signed)
[facility] [address] [place], [place], [address] Phone: [phone]   Fax:  [phone]  Physical Therapy Treatment  Patient Details  Name: Mary Wyatt MRN: [id number] Date of Birth: Mar 14, 1986 Referring Provider (PT): Dr. Molli Knock   Encounter Date: 01/02/2020  PT End of Session - 01/02/20 1242    Visit Number  3   pelvic floor   Number of Visits  7    Date for PT Re-Evaluation  03/12/20   pelvic   Authorization Type  BCBS    Authorization - Visit Number  6   vertigo and pelvic floor visits   Authorization - Number of Visits  30    PT Start Time  6314    PT Stop Time  1310    PT Time Calculation (min)  40 min    Activity Tolerance  Patient tolerated treatment well;No increased pain    Behavior During Therapy  WFL for tasks assessed/performed       Past Medical History:  Diagnosis Date  . Abnormal EKG    Dec 2019 and Jan 2020/ saw cardiologist  . Allergy   . Anemia   . Anxiety   . Celiac disease   . Depression   . Genital warts   . GERD (gastroesophageal reflux disease)   . Interstitial cystitis   . Meningioma ([place])   . Post-operative nausea and vomiting   . RMSF [facility] spotted fever) 2015  . Ulcer     Past Surgical History:  Procedure Laterality Date  . COLONOSCOPY  2013  . CRANIOTOMY  09/2018  . laproscopic surgery     2013 and 2019  . OVARIAN CYST REMOVAL    . TONSILLECTOMY    . UPPER GASTROINTESTINAL ENDOSCOPY  2013    There were no vitals filed for this visit.  Subjective Assessment - 01/02/20 1233    Subjective  I was sore in the groin after the last visit. The pelvic pain is alot better. The pelvic pain is 60% better. Endoscopy on Friday morning. I do feel something is going on digestively. Past several months she is having more bowel movements per day in the morning. Takes more to get the stool out. Stool is Tyype 5 and 6.    Pertinent History   abnormal EKG Dec. 2019 & Jan. 2020, had a normal EKG in Feb 2021:  Celiac disease:  depression & anxiety:  GERD:  meningioma with craniotomy 09/2018;  [facility] Spotted Fever 2015    Limitations  Standing;Walking;House hold activities    Patient Stated Goals  reduce pelvic floor pain    Currently in Pain?  Yes    Pain Score  3     Pain Location  Abdomen    Pain Orientation  Right;Left    Pain Descriptors / Indicators  Dull    Pain Type  Chronic pain    Pain Onset  More than a month ago    Pain Frequency  Constant    Aggravating Factors   massage the abdomen    Pain Relieving Factors  heat                       [facility] Adult PT Treatment/Exercise - 01/02/20 0001      Self-Care   Self-Care  Other Self-Care Comments    Other Self-Care Comments   educated patient on endometriosis, where they found it in her surgeries  Lumbar Exercises: Stretches   Hip Flexor Stretch  Right;Left;1 rep;60 seconds    Hip Flexor Stretch Limitations  foam roll    ITB Stretch  Right;Left;1 rep;60 seconds    ITB Stretch Limitations  foam roll    Piriformis Stretch  Right;Left;1 rep;60 seconds   30 sec both legs sitting   Piriformis Stretch Limitations  foam roll      Manual Therapy   Manual Therapy  Soft tissue mobilization;Myofascial release    Soft tissue mobilization  to biltateral hip adductors; right piriformis while stretching the piriformis    Myofascial Release  release around the umbilicus and right side of mid abdomen             PT Education - 01/02/20 1316    Education Details  education on where her endometriosis was in the pelvic area and reviewed anatomy of the pelvic organs    Person(s) Educated  Patient    Methods  Explanation    Comprehension  Verbalized understanding       PT Short Term Goals - 01/02/20 1321      PT SHORT TERM GOAL #1   Title  independent with initial HEP    Time  4    Period  Weeks    Status  Achieved      PT SHORT TERM GOAL  #2   Title  instructed on how to massage the pelvic floor muscles to reduce her pain    Time  4    Period  Weeks    Status  Achieved      PT SHORT TERM GOAL #3   Title  lower abdominal pain decreased </= 4/10 due to improve mobility of pelvic floor tissue    Time  4    Period  Weeks    Status  On-going        PT Long Term Goals - 12/28/19 2141      PT LONG TERM GOAL #1   Title  Pt will improve DHI score from 12% to </= 6% to indicate improvement in dizziness for improved quality of life.    Baseline  DHI 12% - 12-15-19    Time  6    Period  Weeks    Status  New      PT LONG TERM GOAL #2   Title  Improve SOT composite score by at least 15 points from baseline to demo improved balance and increased vestibular function/input in maintaining balance.    Time  6    Period  Weeks    Status  New      PT LONG TERM GOAL #3   Title  Independent in HEP for balance and vestibular exercises.    Time  6    Period  Weeks    Status  New            Plan - 01/02/20 1317    Clinical Impression Statement  Patient reports she has 60% less pelvic pain. Patient stool has changed in the past several months that she goes more frequently in the morning and has Type 5-6 stool. Patient had less abdominal tenderness after the manual work and the right side of the umbilicus was softer. Patient had less tenderness located in the hip adductors. Patient would benefit from skilled therapy to reduce her pain to improve quality of life.    Personal Factors and Comorbidities  Behavior Pattern;Comorbidity 2;Past/Current Experience;Time since onset of injury/illness/exacerbation;Social Background    Comorbidities  abnormal  EKG Dec. 2019 & Jan. 2020:  Celiac disease:  depression & anxiety:  GERD:  meningioma with craniotomy 09/2018:  RMSF 2015    Examination-Activity Limitations  Stand;Bend;Locomotion Level;Stairs;Squat;Transfers    Examination-Participation Restrictions  Cleaning;Meal Prep;Community  Activity;Shop    Stability/Clinical Decision Making  Evolving/Moderate complexity    Rehab Potential  Good    PT Frequency  1x / week   pelvic   PT Duration  12 weeks   pelvic   PT Treatment/Interventions  Neuromuscular re-education;Therapeutic activities;Therapeutic exercise;Patient/family education;Biofeedback;Cryotherapy;Electrical Stimulation;Ultrasound;Moist Heat;Manual techniques;Dry needling    PT Next Visit Plan  work internally to the pelvic floor, work on rib mobility and breathing    PT Home Exercise Plan  Access Code: [email or web address]    Consulted and Agree with Plan of Care  Patient       Patient will benefit from skilled therapeutic intervention in order to improve the following deficits and impairments:  Decreased coordination, Decreased range of motion, Increased fascial restricitons, Increased muscle spasms, Pain, Decreased strength  Visit Diagnosis: Cramp and spasm  Muscle weakness (generalized)     Problem List Patient Active Problem List   Diagnosis Date Noted  . Anxiety and depression 06/15/2017  . Family history of brain aneurysm 04/19/2017  . Rhinitis, allergic 04/20/2014  . Generalized anxiety disorder 04/20/2014  . Celiac disease 12/15/2012  . Interstitial cystitis     Earlie Counts, PT 01/02/20 1:23 PM   [facility] [address] [place], [place], [address] Phone: [phone]   Fax:  [phone]  Name: Mary Wyatt MRN: [id number] Date of Birth: 01-30-1986

## 2020-01-03 ENCOUNTER — Ambulatory Visit (INDEPENDENT_AMBULATORY_CARE_PROVIDER_SITE_OTHER): Payer: BC Managed Care – PPO | Admitting: Clinical

## 2020-01-03 ENCOUNTER — Ambulatory Visit: Payer: BC Managed Care – PPO | Admitting: Physical Therapy

## 2020-01-03 ENCOUNTER — Encounter: Payer: Self-pay | Admitting: Physical Therapy

## 2020-01-03 DIAGNOSIS — M6281 Muscle weakness (generalized): Secondary | ICD-10-CM | POA: Diagnosis not present

## 2020-01-03 DIAGNOSIS — R42 Dizziness and giddiness: Secondary | ICD-10-CM | POA: Diagnosis not present

## 2020-01-03 DIAGNOSIS — F419 Anxiety disorder, unspecified: Secondary | ICD-10-CM

## 2020-01-03 DIAGNOSIS — R262 Difficulty in walking, not elsewhere classified: Secondary | ICD-10-CM | POA: Diagnosis not present

## 2020-01-03 DIAGNOSIS — R252 Cramp and spasm: Secondary | ICD-10-CM | POA: Diagnosis not present

## 2020-01-03 DIAGNOSIS — R2681 Unsteadiness on feet: Secondary | ICD-10-CM

## 2020-01-03 NOTE — Therapy (Signed)
[facility] [address] [place], [place], [address] Phone: [phone]   Fax:  [phone]  Physical Therapy Treatment  Patient Details  Name: Mary Wyatt MRN: [id number] Date of Birth: November 21, 1985 Referring Provider (PT): Dr. Molli Knock   Encounter Date: 01/03/2020  PT End of Session - 01/03/20 2112    Visit Number  4   4th visit for vestibular rehab   Number of Visits  6   6 visits for vest. rehab   Date for PT Re-Evaluation  01/27/20   pelvic   Authorization Type  BCBS    Authorization - Visit Number  7   vertigo and pelvic floor visits   Authorization - Number of Visits  30    PT Start Time  1020    PT Stop Time  1058    PT Time Calculation (min)  38 min    Activity Tolerance  Patient tolerated treatment well    Behavior During Therapy  [facility] for tasks assessed/performed       Past Medical History:  Diagnosis Date  . Abnormal EKG    Dec 2019 and Jan 2020/ saw cardiologist  . Allergy   . Anemia   . Anxiety   . Celiac disease   . Depression   . Genital warts   . GERD (gastroesophageal reflux disease)   . Interstitial cystitis   . Meningioma ([place])   . Post-operative nausea and vomiting   . RMSF [facility] spotted fever) 2015  . Ulcer     Past Surgical History:  Procedure Laterality Date  . COLONOSCOPY  2013  . CRANIOTOMY  09/2018  . laproscopic surgery     2013 and 2019  . OVARIAN CYST REMOVAL    . TONSILLECTOMY    . UPPER GASTROINTESTINAL ENDOSCOPY  2013    There were no vitals filed for this visit.  Subjective Assessment - 01/03/20 1027    Subjective  Pt reports she thinks the dizziness may be related to problem with her eustachian tubes - says she had some increased dizziness this past weekend and stated her ears felt stopped up; says she now can take decongestant since she is not taking the anxiety medication    Pertinent History  abnormal EKG Dec. 2019 & Jan. 2020,  had a normal EKG in Feb 2021:  Celiac disease:  depression & anxiety:  GERD:  meningioma with craniotomy 09/2018;  [facility] Spotted Fever 2015    Limitations  Standing;Walking;House hold activities    Patient Stated Goals  reduce dizziness    Currently in Pain?  No/denies    Pain Onset  More than a month ago                            Balance Exercises - 01/03/20 2107      Balance Exercises: Standing   Standing Eyes Opened  Narrow base of support (BOS);Wide (BOA);Head turns;Foam/compliant surface;5 reps    Standing Eyes Closed  Narrow base of support (BOS);Wide (BOA);Head turns;Foam/compliant surface;5 reps    Rockerboard  Anterior/posterior;EO;EC;10 reps;Head turns   with UE support prn   Other Standing Exercises  pt performed amb. tossing ball straight up in front, then tossing and catching on Rt/Lt sides for approx. 35' x 2 reps;  pt performed amb. visuall tracking ball in various directions - clockwise, counterclockwise, "V" and "+" patterns 35' x 1 rep each pattern     Other  Standing Exercises Comments  Pt performed marching on mat on incline/decline - focusing straight ahead, progressing to marching with EC , and marching with EO with head turns hoizontal and vertical 5 reps each         PT Education - 01/03/20 2109    Education Details  progressed x1 viewing exercise to patterned background; added marching on foam with head turns    Person(s) Educated  Patient    Methods  Explanation;Demonstration    Comprehension  Verbalized understanding       PT Short Term Goals - 01/03/20 2124      PT SHORT TERM GOAL #2   Time  4        PT Long Term Goals - 01/03/20 2125      PT LONG TERM GOAL #1   Title  Pt will improve DHI score from 12% to </= 6% to indicate improvement in dizziness for improved quality of life.    Baseline  DHI 12% - 12-15-19    Time  6    Period  Weeks    Status  New      PT LONG TERM GOAL #2   Title  Improve SOT composite score  by at least 15 points from baseline to demo improved balance and increased vestibular function/input in maintaining balance.    Time  6    Period  Weeks    Status  New      PT LONG TERM GOAL #3   Title  Independent in HEP for balance and vestibular exercises.    Time  6    Period  Weeks    Status  New            Plan - 01/03/20 2118    Clinical Impression Statement  Etiology of dizziness appears to be multi-factorial with some VOR impairment, possible medication side effects as pt reports less dizziness since she has discontinued taking Xanax medication and possile eustachian tube dysfunction.  Pt does well maintaining balance on compliant surface with most dizzinss reported with ambulation with visual tracking of ball in various patterns.  Pt is progressing towards goals.    Personal Factors and Comorbidities  Behavior Pattern;Comorbidity 2;Past/Current Experience;Time since onset of injury/illness/exacerbation;Social Background    Comorbidities  abnormal EKG Dec. 2019 & Jan. 2020:  Celiac disease:  depression & anxiety:  GERD:  meningioma with craniotomy 09/2018:  RMSF 2015    Examination-Activity Limitations  Stand;Bend;Locomotion Level;Stairs;Squat;Transfers    Examination-Participation Restrictions  Cleaning;Meal Prep;Community Activity;Shop    Stability/Clinical Decision Making  Evolving/Moderate complexity    Rehab Potential  Good    PT Frequency  1x / week   pelvic   PT Duration  12 weeks   pelvic   PT Treatment/Interventions  Neuromuscular re-education;Therapeutic activities;Therapeutic exercise;Patient/family education;Biofeedback;Cryotherapy;Electrical Stimulation;Ultrasound;Moist Heat;Manual techniques;Dry needling    PT Next Visit Plan  cont with visual/vestibular exercises; check updated HEP - D/C next session?    PT Home Exercise Plan  Access Code: [email or web address]    Consulted and Agree with Plan of Care  Patient       Patient will benefit from skilled therapeutic  intervention in order to improve the following deficits and impairments:  Decreased coordination, Decreased range of motion, Increased fascial restricitons, Increased muscle spasms, Pain, Decreased strength  Visit Diagnosis: Dizziness and giddiness  Unsteadiness on feet     Problem List Patient Active Problem List   Diagnosis Date Noted  . Anxiety and depression 06/15/2017  . Family history of  brain aneurysm 04/19/2017  . Rhinitis, allergic 04/20/2014  . Generalized anxiety disorder 04/20/2014  . Celiac disease 12/15/2012  . Interstitial cystitis     Alda Lea, PT 01/03/2020, 9:32 PM  [facility] [address] [place], [place], [address] Phone: [phone]   Fax:  [phone]  Name: CYNCERE RUHE MRN: [id number] Date of Birth: [birth year]

## 2020-01-05 DIAGNOSIS — N809 Endometriosis, unspecified: Secondary | ICD-10-CM | POA: Diagnosis not present

## 2020-01-05 DIAGNOSIS — M7918 Myalgia, other site: Secondary | ICD-10-CM | POA: Diagnosis not present

## 2020-01-05 DIAGNOSIS — D329 Benign neoplasm of meninges, unspecified: Secondary | ICD-10-CM | POA: Diagnosis not present

## 2020-01-05 DIAGNOSIS — R194 Change in bowel habit: Secondary | ICD-10-CM | POA: Diagnosis not present

## 2020-01-06 DIAGNOSIS — K219 Gastro-esophageal reflux disease without esophagitis: Secondary | ICD-10-CM | POA: Diagnosis not present

## 2020-01-06 DIAGNOSIS — K900 Celiac disease: Secondary | ICD-10-CM | POA: Diagnosis not present

## 2020-01-06 DIAGNOSIS — R8569 Abnormal cytological findings in specimens from other digestive organs and abdominal cavity: Secondary | ICD-10-CM | POA: Diagnosis not present

## 2020-01-06 DIAGNOSIS — K222 Esophageal obstruction: Secondary | ICD-10-CM | POA: Diagnosis not present

## 2020-01-09 ENCOUNTER — Encounter: Payer: Self-pay | Admitting: Physical Therapy

## 2020-01-09 ENCOUNTER — Ambulatory Visit: Payer: BC Managed Care – PPO | Admitting: Physical Therapy

## 2020-01-09 ENCOUNTER — Other Ambulatory Visit: Payer: Self-pay

## 2020-01-09 DIAGNOSIS — R262 Difficulty in walking, not elsewhere classified: Secondary | ICD-10-CM | POA: Diagnosis not present

## 2020-01-09 DIAGNOSIS — R252 Cramp and spasm: Secondary | ICD-10-CM

## 2020-01-09 DIAGNOSIS — R42 Dizziness and giddiness: Secondary | ICD-10-CM

## 2020-01-09 DIAGNOSIS — R2681 Unsteadiness on feet: Secondary | ICD-10-CM | POA: Diagnosis not present

## 2020-01-09 DIAGNOSIS — M6281 Muscle weakness (generalized): Secondary | ICD-10-CM

## 2020-01-09 NOTE — Patient Instructions (Signed)
Access Code: [email or web address] URL: [email or web address] Date: 01/09/2020 Prepared by: Earlie Counts  Program Notes tissue rolling of the abdomen wand on the pelvic floor muscles with pressure on a tomato, hold trigger point for 90 seconds, sweep the wand side to side   Exercises Standing Single Leg Stance with Counter Support - 1 x daily - 5 x weekly - 3 reps - 1 sets - 15 hold Standing Tandem Balance with Counter Support - 1 x daily - 5 x weekly - 3 reps - 1 sets - 15 hold Standing Gaze Stabilization with Head Rotation - 1 x daily - 5 x weekly - 10 reps - 1 sets Romberg Stance Eyes Closed on Foam Pad - 1 x daily - 5 x weekly - 3 reps - 1 sets - 30 hold Wide Stance with Eyes Closed and Head Rotation on Foam Pad - 1 x daily - 5 x weekly - 10 reps - 1 sets Seated Piriformis Stretch with Trunk Bend - 1 x daily - 7 x weekly - 2 reps - 1 sets - 30 sec hold Seated Hamstring Stretch - 1 x daily - 7 x weekly - 2 reps - 1 sets - 30 sec hold Cat-Camel - 1 x daily - 7 x weekly - 10 reps - 1 sets Child's Pose Stretch - 1 x daily - 7 x weekly - 1 reps - 1 sets - 30 sec hold Supine Pelvic Floor Stretch - 1 x daily - 7 x weekly - 1 reps - 1 sets - 1-2 min hold Supine Abdominal Wall Massage - 1 x daily - 7 x weekly - 1 reps - 1 sets - 5 min hold Wide Tandem Stance on Foam Pad with Eyes Open - 1 x daily - 7 x weekly - 10 reps - 3 sets [facility] [address], [place], [place] [address] Phone # [phone] Fax [phone]

## 2020-01-09 NOTE — Therapy (Signed)
[facility] [address], [place], [place], [address] Phone: [phone]   Fax:  [phone]  Physical Therapy Treatment  Patient Details  Name: Mary Wyatt MRN: [id number] Date of Birth: 1986/04/03 Referring Provider (PT): Dr. Molli Knock   Encounter Date: 01/09/2020  PT End of Session - 01/09/20 1230    Visit Number  4   4 pelvic floor, 5 vertigo   Date for PT Re-Evaluation  01/27/20    Authorization Type  BCBS    Authorization - Visit Number  9   Vertigo and pelvic floor visits   Authorization - Number of Visits  30    PT Start Time  1230    PT Stop Time  1310    PT Time Calculation (min)  40 min    Activity Tolerance  Patient tolerated treatment well    Behavior During Therapy  [facility] for tasks assessed/performed       Past Medical History:  Diagnosis Date  . Abnormal EKG    Dec 2019 and Jan 2020/ saw cardiologist  . Allergy   . Anemia   . Anxiety   . Celiac disease   . Depression   . Genital warts   . GERD (gastroesophageal reflux disease)   . Interstitial cystitis   . Meningioma ([place])   . Post-operative nausea and vomiting   . RMSF [facility] spotted fever) 2015  . Ulcer     Past Surgical History:  Procedure Laterality Date  . COLONOSCOPY  2013  . CRANIOTOMY  09/2018  . laproscopic surgery     2013 and 2019  . OVARIAN CYST REMOVAL    . TONSILLECTOMY    . UPPER GASTROINTESTINAL ENDOSCOPY  2013    There were no vitals filed for this visit.  Subjective Assessment - 01/09/20 1233    Subjective  Patient reports the right lower abdominal area has been bothering me. I saw the endometriosis doctor. She can see something in the bowel area again. I see her in 2 months.    Pertinent History  abnormal EKG Dec. 2019 & Jan. 2020, had a normal EKG in Feb 2021:  Celiac disease:  depression & anxiety:  GERD:  meningioma with craniotomy 09/2018;  [facility] Spotted Fever 2015    Limitations  Standing;Walking;House hold activities    Patient Stated Goals  reduce the pelvic floor pain    Currently in Pain?  Yes    Pain Score  5     Pain Location  Abdomen    Pain Orientation  Right    Pain Descriptors / Indicators  Constant    Pain Type  Chronic pain    Pain Onset  More than a month ago    Pain Frequency  Constant    Aggravating Factors   massage the abdomen    Pain Relieving Factors  heat    Multiple Pain Sites  No                       [facility] Adult PT Treatment/Exercise - 01/09/20 0001      Manual Therapy   Manual Therapy  Myofascial release;Internal Pelvic Floor    Myofascial Release  tissue rolling to the abdomen; release around the bladder, umbilicus, and right lower abdomen going through the three planes of fascia.     Internal Pelvic Floor  to right obturator interist to release the trigger points and elongate the muscles, educated patient on the  pressure she is to use with the wand, applying pressure for 90 seconds to trigger points, sweeping the pelvic wand side to side             PT Education - 01/09/20 1319    Education Details  Access Code: [email or web address]    Person(s) Educated  Patient    Methods  Explanation;Demonstration;Handout    Comprehension  Verbalized understanding;Returned demonstration       PT Short Term Goals - 01/03/20 2124      PT SHORT TERM GOAL #2   Time  4        PT Long Term Goals - 01/03/20 2125      PT LONG TERM GOAL #1   Title  Pt will improve DHI score from 12% to </= 6% to indicate improvement in dizziness for improved quality of life.    Baseline  DHI 12% - 12-15-19    Time  6    Period  Weeks    Status  New      PT LONG TERM GOAL #2   Title  Improve SOT composite score by at least 15 points from baseline to demo improved balance and increased vestibular function/input in maintaining balance.    Time  6    Period  Weeks    Status  New      PT LONG TERM GOAL #3   Title  Independent in HEP  for balance and vestibular exercises.    Time  6    Period  Weeks    Status  New            Plan - 01/09/20 1258    Clinical Impression Statement  Patient had increased trigger points in the right pelvic floor and right lower abdomen. Patient was educated on the appropriate pressure to use on the right pelvic floor with her pelvic wand. Patient has fascial restrictions in the abdomen making it difficult with doing tissue rolling. Patient had a trigger point on the right side of the umbilicus. Patient will benefit from skilled therapy to reduce her pain to improve quality of life.    Personal Factors and Comorbidities  Behavior Pattern;Comorbidity 2;Past/Current Experience;Time since onset of injury/illness/exacerbation;Social Background    Comorbidities  abnormal EKG Dec. 2019 & Jan. 2020:  Celiac disease:  depression & anxiety:  GERD:  meningioma with craniotomy 09/2018:  RMSF 2015    Examination-Activity Limitations  Stand;Bend;Locomotion Level;Stairs;Squat;Transfers    Examination-Participation Restrictions  Cleaning;Meal Prep;Community Activity;Shop    Stability/Clinical Decision Making  Evolving/Moderate complexity    Rehab Potential  Good    PT Frequency  1x / week    PT Duration  12 weeks    PT Treatment/Interventions  Neuromuscular re-education;Therapeutic activities;Therapeutic exercise;Patient/family education;Biofeedback;Cryotherapy;Electrical Stimulation;Ultrasound;Moist Heat;Manual techniques;Dry needling    PT Next Visit Plan  work on abdominal tissue work, expand the rib cage, work on 360 breathing, internal right pelvic floor work.    PT Home Exercise Plan  Access Code: [email or web address]    Recommended Other Services  MD signed the initial for pelvic floor and vestibular    Consulted and Agree with Plan of Care  Patient       Patient will benefit from skilled therapeutic intervention in order to improve the following deficits and impairments:  Decreased coordination, Decreased  range of motion, Increased fascial restricitons, Increased muscle spasms, Pain, Decreased strength  Visit Diagnosis: Cramp and spasm  Muscle weakness (generalized)     Problem List Patient Active Problem List  Diagnosis Date Noted  . Anxiety and depression 06/15/2017  . Family history of brain aneurysm 04/19/2017  . Rhinitis, allergic 04/20/2014  . Generalized anxiety disorder 04/20/2014  . Celiac disease 12/15/2012  . Interstitial cystitis     Earlie Counts, PT 01/09/20 1:24 PM   [facility] [address], [place], [place], [address] Phone: [phone]   Fax:  [phone]  Name: REOLA BUCKLES MRN: [id number] Date of Birth: 01-30-1986

## 2020-01-10 ENCOUNTER — Encounter: Payer: Self-pay | Admitting: Physical Therapy

## 2020-01-10 NOTE — Therapy (Signed)
[facility] [address] [place], [place], [address] Phone: [phone]   Fax:  [phone]  Physical Therapy Treatment  Patient Details  Name: Mary Wyatt MRN: [id number] Date of Birth: Feb 21, 1986 Referring Provider (PT): Dr. Lind Guest   Encounter Date: 01/09/2020  PT End of Session - 01/09/20 1426    Visit Number  5   4 for PF, 5 for vertigo   Number of Visits  6    Date for PT Re-Evaluation  01/27/20    Authorization Type  BCBS    Authorization - Visit Number  10   Vertigo and pelvic floor visits   Authorization - Number of Visits  30    PT Start Time  1420    PT Stop Time  1500    PT Time Calculation (min)  40 min    Activity Tolerance  Patient tolerated treatment well    Behavior During Therapy  [facility] for tasks assessed/performed       Past Medical History:  Diagnosis Date  . Abnormal EKG    Dec 2019 and Jan 2020/ saw cardiologist  . Allergy   . Anemia   . Anxiety   . Celiac disease   . Depression   . Genital warts   . GERD (gastroesophageal reflux disease)   . Interstitial cystitis   . Meningioma ([place])   . Post-operative nausea and vomiting   . RMSF [facility] spotted fever) 2015  . Ulcer     Past Surgical History:  Procedure Laterality Date  . COLONOSCOPY  2013  . CRANIOTOMY  09/2018  . laproscopic surgery     2013 and 2019  . OVARIAN CYST REMOVAL    . TONSILLECTOMY    . UPPER GASTROINTESTINAL ENDOSCOPY  2013    There were no vitals filed for this visit.  Subjective Assessment - 01/10/20 2047    Subjective  Pt reports improvement in vertigo overall - still is unsure of exactly what the cause is - thinks that eustachian tubes may be contributing to some of the dizziness as she feels as if her ears are stopped up; pt ready for D/C    Pertinent History  abnormal EKG Dec. 2019 & Jan. 2020, had a normal EKG in Feb 2021:  Celiac disease:  depression & anxiety:  GERD:   meningioma with craniotomy 09/2018;  [facility] Spotted Fever 2015    Limitations  Standing;Walking;House hold activities    Patient Stated Goals  reduce dizziness    Currently in Pain?  No/denies    Pain Onset  More than a month ago             Vestibular Assessment - 01/10/20 0001      Visual Acuity   Static  line 10    Dynamic  line 9 - no c/o dizziness after test complete      Theatre manager Comment  Composite score 73/100 (WNL's)         Sensory Organization Test - composite score 73/100;  N=70/100  Somatosensory WNL's Visual 72/100 ; N = 74/100 Vestibular WNL's  Condition 1 - all 3 trials WNL's Condition 2 - trial slightly decr.; trials 2 & 3 WNL's Condition 3 - all 3 trials WNL's Condition 4 - trial 1 WNL's; trials 2 & 3 below N Condition 5 - trials 1 & 2 WNL's; trial 3 below N Condition 6 - all 3 trials WNL's  DHI - score 6% (improved from 12% at initial eval)      PT Education - 01/10/20 2118    Education Details  added balance on foam to HEP for incr. vestibular input    Person(s) Educated  Patient    Methods  Explanation;Demonstration;Handout    Comprehension  Verbalized understanding;Returned demonstration       PT Short Term Goals - 01/03/20 2124      PT SHORT TERM GOAL #2   Time  4        PT Long Term Goals - 01/09/20 1427      PT LONG TERM GOAL #1   Title  Pt will improve DHI score from 12% to </= 6% to indicate improvement in dizziness for improved quality of life.    Baseline  DHI 12% - 12-15-19    Time  6    Period  Weeks    Status  Achieved      PT LONG TERM GOAL #2   Title  Improve SOT composite score by at least 15 points from baseline to demo improved balance and increased vestibular function/input in maintaining balance.    Baseline  deferred due to initial score WNL's    Time  6    Period  Weeks    Status  Deferred      PT LONG TERM GOAL #3   Title   Independent in HEP for balance and vestibular exercises.    Time  6    Period  Weeks    Status  Achieved            Plan - 01/10/20 2059    Clinical Impression Statement  Pt has met 2/3 LTG's with goal #2 being deferred due to initial composite score on SOT being WNL's.  Pt's composite score on SOT remains WNL's with score 73/100 with N=70/100:  somatosensory & vestibular inputs WNL's with visual input only slightly decr. at 62/100 with N=74/100.  Pt's DVA is WNL's; pt's DHI score has improved from 12% to 6% at current time.    Personal Factors and Comorbidities  Behavior Pattern;Comorbidity 2;Past/Current Experience;Time since onset of injury/illness/exacerbation;Social Background    Comorbidities  abnormal EKG Dec. 2019 & Jan. 2020:  Celiac disease:  depression & anxiety:  GERD:  meningioma with craniotomy 09/2018:  RMSF 2015    Examination-Activity Limitations  Stand;Bend;Locomotion Level;Stairs;Squat;Transfers    Examination-Participation Restrictions  Cleaning;Meal Prep;Community Activity;Shop    Stability/Clinical Decision Making  Evolving/Moderate complexity    Rehab Potential  Good    PT Frequency  1x / week   pelvic   PT Duration  12 weeks   pelvic   PT Treatment/Interventions  Neuromuscular re-education;Therapeutic activities;Therapeutic exercise;Patient/family education;Biofeedback;Cryotherapy;Electrical Stimulation;Ultrasound;Moist Heat;Manual techniques;Dry needling    PT Next Visit Plan  D/C next session    PT Home Exercise Plan  Access Code: [email or web address]    Consulted and Agree with Plan of Care  Patient       Patient will benefit from skilled therapeutic intervention in order to improve the following deficits and impairments:  Decreased coordination, Decreased range of motion, Increased fascial restricitons, Increased muscle spasms, Pain, Decreased strength  Visit Diagnosis: Dizziness and giddiness  Unsteadiness on feet     Problem List Patient Active Problem  List   Diagnosis Date Noted  . Anxiety and depression 06/15/2017  . Family history of brain aneurysm 04/19/2017  . Rhinitis, allergic 04/20/2014  . Generalized anxiety disorder 04/20/2014  . Celiac disease 12/15/2012  . Interstitial cystitis  PHYSICAL THERAPY DISCHARGE SUMMARY  Visits from Start of Care: 5  Current functional level related to goals / functional outcomes: See above for progress towards goals - pt reports vertigo is much improved   Remaining deficits: Continued mild c/o vertigo (etiology unknown) but overall much improved from intensity at time of initial eval  DHI score improved from 12% to 6%  Education / Equipment: Pt has been instructed in HEP for gaze stabilization and vestibular exercises (balance on foam) Plan: Patient agrees to discharge.  Patient goals were met. Patient is being discharged due to meeting the stated rehab goals.  ?????          Alda Lea, PT 01/10/2020, 9:19 PM  [facility] [address] [place], [place], [address] Phone: [phone]   Fax:  [phone]  Name: Mary Wyatt MRN: [id number] Date of Birth: [birth year]

## 2020-01-16 ENCOUNTER — Encounter: Payer: BC Managed Care – PPO | Admitting: Physical Therapy

## 2020-01-16 DIAGNOSIS — R82998 Other abnormal findings in urine: Secondary | ICD-10-CM | POA: Diagnosis not present

## 2020-01-16 DIAGNOSIS — R3989 Other symptoms and signs involving the genitourinary system: Secondary | ICD-10-CM | POA: Diagnosis not present

## 2020-01-16 DIAGNOSIS — R350 Frequency of micturition: Secondary | ICD-10-CM | POA: Diagnosis not present

## 2020-01-16 DIAGNOSIS — N809 Endometriosis, unspecified: Secondary | ICD-10-CM | POA: Diagnosis not present

## 2020-01-17 ENCOUNTER — Ambulatory Visit (INDEPENDENT_AMBULATORY_CARE_PROVIDER_SITE_OTHER): Payer: BC Managed Care – PPO | Admitting: Clinical

## 2020-01-17 DIAGNOSIS — F419 Anxiety disorder, unspecified: Secondary | ICD-10-CM | POA: Diagnosis not present

## 2020-01-23 ENCOUNTER — Ambulatory Visit: Payer: BC Managed Care – PPO | Attending: Student | Admitting: Physical Therapy

## 2020-01-23 ENCOUNTER — Encounter: Payer: Self-pay | Admitting: Physical Therapy

## 2020-01-23 ENCOUNTER — Other Ambulatory Visit: Payer: Self-pay

## 2020-01-23 DIAGNOSIS — M6281 Muscle weakness (generalized): Secondary | ICD-10-CM | POA: Insufficient documentation

## 2020-01-23 DIAGNOSIS — N3289 Other specified disorders of bladder: Secondary | ICD-10-CM | POA: Diagnosis not present

## 2020-01-23 DIAGNOSIS — R252 Cramp and spasm: Secondary | ICD-10-CM | POA: Diagnosis not present

## 2020-01-23 NOTE — Patient Instructions (Signed)
Access Code: [email or web address] URL: [email or web address] Date: 01/23/2020 Prepared by: Earlie Counts  Program Notes tissue rolling of the abdomen wand on the pelvic floor muscles with pressure on a tomato, hold trigger point for 90 seconds, sweep the wand side to side   Exercises Supine Bridge with Mini Swiss Ball Between Knees - 1 x daily - 7 x weekly - 1 sets - 15 reps Supine Single Leg Lift - 1 x daily - 7 x weekly - 1 sets - 10 reps Supine Abdominal Wall Massage - 1 x daily - 7 x weekly - 1 reps - 1 sets - 5 min hold Supine Pelvic Floor Stretch - 1 x daily - 7 x weekly - 1 reps - 1 sets - 1-2 min hold Child's Pose Stretch - 1 x daily - 7 x weekly - 1 reps - 1 sets - 30 sec hold Seated Hamstring Stretch - 1 x daily - 7 x weekly - 2 reps - 1 sets - 30 sec hold Seated Piriformis Stretch with Trunk Bend - 1 x daily - 7 x weekly - 2 reps - 1 sets - 30 sec hold Cat-Camel - 1 x daily - 7 x weekly - 10 reps - 1 sets Standing Single Leg Stance with Counter Support - 3 reps - 1 sets - 15 hold Standing Tandem Balance with Counter Support - 3 reps - 1 sets - 15 hold Standing Gaze Stabilization with Head Rotation - 10 reps - 1 sets Romberg Stance Eyes Closed on Foam Pad - 3 reps - 1 sets - 30 hold Wide Stance with Eyes Closed and Head Rotation on Foam Pad - 10 reps - 1 sets Wide Tandem Stance on Foam Pad with Eyes Open - 10 reps - 3 sets [facility] [address], [place], [place] [address] Phone # [phone] Fax [phone]

## 2020-01-23 NOTE — Therapy (Signed)
[facility] [address] [place], [place], [address] Phone: [phone]   Fax:  [phone]  Physical Therapy Treatment  Patient Details  Name: Mary Wyatt MRN: [id number] Date of Birth: 06-29-1986 Referring Provider (PT): Dr. Molli Knock   Encounter Date: 01/23/2020  PT End of Session - 01/23/20 1248    Visit Number  5    Date for PT Re-Evaluation  03/12/20    Authorization Type  BCBS    Authorization - Visit Number  11    Authorization - Number of Visits  30    PT Start Time  1230    PT Stop Time  1310    PT Time Calculation (min)  40 min    Activity Tolerance  Patient tolerated treatment well    Behavior During Therapy  [facility] for tasks assessed/performed       Past Medical History:  Diagnosis Date  . Abnormal EKG    Dec 2019 and Jan 2020/ saw cardiologist  . Allergy   . Anemia   . Anxiety   . Celiac disease   . Depression   . Genital warts   . GERD (gastroesophageal reflux disease)   . Interstitial cystitis   . Meningioma ([place])   . Post-operative nausea and vomiting   . RMSF [facility] spotted fever) 2015  . Ulcer     Past Surgical History:  Procedure Laterality Date  . COLONOSCOPY  2013  . CRANIOTOMY  09/2018  . laproscopic surgery     2013 and 2019  . OVARIAN CYST REMOVAL    . TONSILLECTOMY    . UPPER GASTROINTESTINAL ENDOSCOPY  2013    There were no vitals filed for this visit.  Subjective Assessment - 01/23/20 1236    Subjective  I just had a cystoscopy this morning. I had my cycle and I felt okay. I have not used my pelvic wand and I think I was pushing too hard and increased the right lower abdomenal pain.  I have been doing the tissue rolling on the abdomen helps.    Pertinent History  abnormal EKG Dec. 2019 & Jan. 2020, had a normal EKG in Feb 2021:  Celiac disease:  depression & anxiety:  GERD:  meningioma with craniotomy 09/2018;  [facility] Spotted Fever  2015    Patient Stated Goals  reduce pelvic floor pain    Currently in Pain?  Yes    Pain Score  3     Pain Location  Abdomen    Pain Orientation  Right    Pain Descriptors / Indicators  Dull    Pain Type  Chronic pain    Pain Onset  More than a month ago    Pain Frequency  Constant    Aggravating Factors   massage the abdomen    Pain Relieving Factors  heat    Multiple Pain Sites  No         [facility] PT Assessment - 01/23/20 0001      Palpation   SI assessment   right asis is anteroirly rotated                   [facility] Adult PT Treatment/Exercise - 01/23/20 0001      Lumbar Exercises: Supine   Bent Knee Raise  10 reps;1 second    Bent Knee Raise Limitations  on right    Bridge with Ball Squeeze  20 reps;1 second    Other Supine  Lumbar Exercises  hookly with abdominal contraction rolling foot on ball the raise heels    Other Supine Lumbar Exercises  hookly ball squeeze with knee extension with abdominal bracing      Manual Therapy   Manual Therapy  Soft tissue mobilization;Muscle Energy Technique    Soft tissue mobilization  right obturator and pectineus     Muscle Energy Technique  correct right ilium             PT Education - 01/23/20 1312    Education Details  Access Code: [email or web address]    Person(s) Educated  Patient    Methods  Explanation;Demonstration;Verbal cues;Handout    Comprehension  Verbalized understanding;Returned demonstration       PT Short Term Goals - 01/23/20 1317      PT SHORT TERM GOAL #1   Title  independent with initial HEP    Time  4    Period  Weeks    Status  Achieved    Target Date  01/16/20      PT SHORT TERM GOAL #2   Title  instructed on how to massage the pelvic floor muscles to reduce her pain    Time  4    Period  Weeks    Status  Achieved      PT SHORT TERM GOAL #3   Title  lower abdominal pain decreased </= 4/10 due to improve mobility of pelvic floor tissue    Baseline  was 2-3/10 today    Time  4     Period  Weeks    Status  On-going    Target Date  01/16/20        PT Long Term Goals - 01/09/20 1427      PT LONG TERM GOAL #1   Title  Pt will improve DHI score from 12% to </= 6% to indicate improvement in dizziness for improved quality of life.    Baseline  DHI 12% - 12-15-19    Time  6    Period  Weeks    Status  Achieved      PT LONG TERM GOAL #2   Title  Improve SOT composite score by at least 15 points from baseline to demo improved balance and increased vestibular function/input in maintaining balance.    Baseline  deferred due to initial score WNL's    Time  6    Period  Weeks    Status  Deferred      PT LONG TERM GOAL #3   Title  Independent in HEP for balance and vestibular exercises.    Time  6    Period  Weeks    Status  Achieved            Plan - 01/23/20 1242    Clinical Impression Statement  Patient is having difficulty with keeping her pelvis stable with leg movements. Patient understands how to engage the core correctly. After manual work her pelvis in correct alignment. Patient pain decreased to 1/10 after manual work. Patient continues to need core stabilization to improve SI stabily.    Personal Factors and Comorbidities  Behavior Pattern;Comorbidity 2;Past/Current Experience;Time since onset of injury/illness/exacerbation;Social Background    Comorbidities  abnormal EKG Dec. 2019 & Jan. 2020:  Celiac disease:  depression & anxiety:  GERD:  meningioma with craniotomy 09/2018:  RMSF 2015    Examination-Activity Limitations  Stand;Bend;Locomotion Level;Stairs;Squat;Transfers    Examination-Participation Restrictions  Cleaning;Meal Prep;Community Activity;Shop    Stability/Clinical Decision Making  Evolving/Moderate  complexity    Rehab Potential  Good    PT Frequency  1x / week    PT Duration  12 weeks    PT Treatment/Interventions  Neuromuscular re-education;Therapeutic activities;Therapeutic exercise;Patient/family  education;Biofeedback;Cryotherapy;Electrical Stimulation;Ultrasound;Moist Heat;Manual techniques;Dry needling    PT Next Visit Plan  work on low level core stability; soft tissue work to pelvic floor if needed.Check pelvic algnment    PT Home Exercise Plan  Access Code: [email or web address]    Consulted and Agree with Plan of Care  Patient       Patient will benefit from skilled therapeutic intervention in order to improve the following deficits and impairments:  Decreased coordination, Decreased range of motion, Increased fascial restricitons, Increased muscle spasms, Pain, Decreased strength  Visit Diagnosis: Cramp and spasm  Muscle weakness (generalized)     Problem List Patient Active Problem List   Diagnosis Date Noted  . Anxiety and depression 06/15/2017  . Family history of brain aneurysm 04/19/2017  . Rhinitis, allergic 04/20/2014  . Generalized anxiety disorder 04/20/2014  . Celiac disease 12/15/2012  . Interstitial cystitis     Earlie Counts, PT 01/23/20 1:19 PM   [facility] [address], [place], [place], [address] Phone: [phone]   Fax:  [phone]  Name: CASH MEADOW MRN: [id number] Date of Birth: August 22, 1986

## 2020-01-24 DIAGNOSIS — R1084 Generalized abdominal pain: Secondary | ICD-10-CM | POA: Diagnosis not present

## 2020-01-24 DIAGNOSIS — G8929 Other chronic pain: Secondary | ICD-10-CM | POA: Diagnosis not present

## 2020-01-24 DIAGNOSIS — K2980 Duodenitis without bleeding: Secondary | ICD-10-CM | POA: Diagnosis not present

## 2020-01-24 DIAGNOSIS — Z888 Allergy status to other drugs, medicaments and biological substances status: Secondary | ICD-10-CM | POA: Diagnosis not present

## 2020-01-27 DIAGNOSIS — R42 Dizziness and giddiness: Secondary | ICD-10-CM | POA: Diagnosis not present

## 2020-01-27 DIAGNOSIS — H6983 Other specified disorders of Eustachian tube, bilateral: Secondary | ICD-10-CM | POA: Diagnosis not present

## 2020-01-30 ENCOUNTER — Encounter: Payer: Self-pay | Admitting: Physical Therapy

## 2020-01-30 ENCOUNTER — Other Ambulatory Visit: Payer: Self-pay

## 2020-01-30 ENCOUNTER — Ambulatory Visit: Payer: BC Managed Care – PPO | Admitting: Physical Therapy

## 2020-01-30 DIAGNOSIS — R252 Cramp and spasm: Secondary | ICD-10-CM | POA: Diagnosis not present

## 2020-01-30 DIAGNOSIS — M6281 Muscle weakness (generalized): Secondary | ICD-10-CM | POA: Diagnosis not present

## 2020-01-30 NOTE — Patient Instructions (Signed)
Access Code: [email or web address] URL: [email or web address] Date: 01/30/2020 Prepared by: Earlie Counts  Program Notes tissue rolling of the abdomen wand on the pelvic floor muscles with pressure on a tomato, hold trigger point for 90 seconds, sweep the wand side to side   Exercises Supine Transversus Abdominis Bracing - Hands on Stomach - 1 x daily - 7 x weekly - 3 sets - 10 reps Supine March - 1 x daily - 7 x weekly - 3 sets - 10 reps Supine Bridge with Mini Swiss Ball Between Knees - 1 x daily - 7 x weekly - 1 sets - 15 reps Supine Single Leg Lift - 1 x daily - 7 x weekly - 1 sets - 10 reps Supine Abdominal Wall Massage - 1 x daily - 7 x weekly - 1 reps - 1 sets - 5 min hold Supine Pelvic Floor Stretch - 1 x daily - 7 x weekly - 1 reps - 1 sets - 1-2 min hold Child's Pose Stretch - 1 x daily - 7 x weekly - 1 reps - 1 sets - 30 sec hold Seated Hamstring Stretch - 1 x daily - 7 x weekly - 2 reps - 1 sets - 30 sec hold Seated Piriformis Stretch with Trunk Bend - 1 x daily - 7 x weekly - 2 reps - 1 sets - 30 sec hold Cat-Camel - 1 x daily - 7 x weekly - 10 reps - 1 sets Standing Single Leg Stance with Counter Support - 3 reps - 1 sets - 15 hold Standing Tandem Balance with Counter Support - 3 reps - 1 sets - 15 hold Standing Gaze Stabilization with Head Rotation - 10 reps - 1 sets Romberg Stance Eyes Closed on Foam Pad - 3 reps - 1 sets - 30 hold Wide Stance with Eyes Closed and Head Rotation on Foam Pad - 10 reps - 1 sets Wide Tandem Stance on Foam Pad with Eyes Open - 10 reps - 3 sets [facility] [address], [place], [place] [address] Phone # [phone] Fax [phone]

## 2020-01-30 NOTE — Therapy (Signed)
[facility] [address], [place], [place], [address] Phone: [phone]   Fax:  [phone]  Physical Therapy Treatment  Patient Details  Name: Mary Wyatt MRN: [id number] Date of Birth: 05-08-1986 Referring Provider (PT): Dr. Molli Knock   Encounter Date: 01/30/2020  PT End of Session - 01/30/20 1316    Visit Number  6    Date for PT Re-Evaluation  03/12/20    Authorization Type  BCBS    Authorization - Visit Number  12    Authorization - Number of Visits  30    PT Start Time  2671    PT Stop Time  1315    PT Time Calculation (min)  45 min    Activity Tolerance  Patient tolerated treatment well    Behavior During Therapy  [facility] for tasks assessed/performed       Past Medical History:  Diagnosis Date  . Abnormal EKG    Dec 2019 and Jan 2020/ saw cardiologist  . Allergy   . Anemia   . Anxiety   . Celiac disease   . Depression   . Genital warts   . GERD (gastroesophageal reflux disease)   . Interstitial cystitis   . Meningioma ([place])   . Post-operative nausea and vomiting   . [facility] spotted fever) 2015  . Ulcer     Past Surgical History:  Procedure Laterality Date  . COLONOSCOPY  2013  . CRANIOTOMY  09/2018  . laproscopic surgery     2013 and 2019  . OVARIAN CYST REMOVAL    . TONSILLECTOMY    . UPPER GASTROINTESTINAL ENDOSCOPY  2013    There were no vitals filed for this visit.  Subjective Assessment - 01/30/20 1236    Subjective  I am not feeling the pain as much in the pelvic floor with the wand. I am able to lift the right leg more without the hip popping up.    Pertinent History  abnormal EKG Dec. 2019 & Jan. 2020, had a normal EKG in Feb 2021:  Celiac disease:  depression & anxiety:  GERD:  meningioma with craniotomy 09/2018;  [facility] Spotted Fever 2015    Limitations  Standing;Walking;House hold activities    Patient Stated Goals  reduce pelvic floor  pain    Currently in Pain?  Yes    Pain Score  2     Pain Location  Abdomen    Pain Orientation  Right    Pain Descriptors / Indicators  Dull    Pain Type  Chronic pain    Pain Onset  More than a month ago    Pain Frequency  Constant    Aggravating Factors   massage, the nustep    Pain Relieving Factors  heat    Multiple Pain Sites  No         [facility] PT Assessment - 01/30/20 0001      Palpation   SI assessment   right asis is anteroirly rotated                   [facility] Adult PT Treatment/Exercise - 01/30/20 0001      Lumbar Exercises: Stretches   Piriformis Stretch  Right;60 seconds    Piriformis Stretch Limitations  pushing the right knee back and forth      Lumbar Exercises: Aerobic   Nustep  level 1 for 5 minutes while assessing the patient and monitoring the right lower  abdominal pain, added a towel roll in the mid thoracic area      Lumbar Exercises: Supine   Bridge with Ball Squeeze  20 reps;1 second    Other Supine Lumbar Exercises  hookly with pressing right hand into right knee with a ball and left knee fleixon 10times    Other Supine Lumbar Exercises  hookly rolling the ball back and forth with abdominal bracing; hold ball overhead and move side to side to engage the obliques      Knee/Hip Exercises: Sidelying   Hip ADduction  Strengthening;Right;Left    Hip ADduction Limitations  instructions on lower abdominal contraction    Other Sidelying Knee/Hip Exercises  ball between knees with squeeze and bring the hip right post.       Manual Therapy   Manual Therapy  Joint mobilization;Muscle Energy Technique    Joint Mobilization  PA mobilization to T4-T10; rotational mobilization to L5; sacral mobilizaiton to correct left rotation    Muscle Energy Technique  correct right ilium             PT Education - 01/30/20 1316    Education Details  Access Code: [email or web address]    Person(s) Educated  Patient    Methods  Explanation;Demonstration;Verbal  cues;Handout    Comprehension  Returned demonstration;Verbalized understanding       PT Short Term Goals - 01/23/20 1317      PT SHORT TERM GOAL #1   Title  independent with initial HEP    Time  4    Period  Weeks    Status  Achieved    Target Date  01/16/20      PT SHORT TERM GOAL #2   Title  instructed on how to massage the pelvic floor muscles to reduce her pain    Time  4    Period  Weeks    Status  Achieved      PT SHORT TERM GOAL #3   Title  lower abdominal pain decreased </= 4/10 due to improve mobility of pelvic floor tissue    Baseline  was 2-3/10 today    Time  4    Period  Weeks    Status  On-going    Target Date  01/16/20        PT Long Term Goals - 01/30/20 1322      PT LONG TERM GOAL #4   Title  independent with advanced pelvic floor exericses    Time  12    Period  Weeks    Status  On-going      PT LONG TERM GOAL #5   Title  pain with daily activities decreased </= 2/10 and intermittent due to decreased pelvic floor muscle tension    Time  12    Period  Weeks    Status  On-going      PT LONG TERM GOAL #6   Title  after urinating and standing up no dribbling of urine due to improved pelvic floor coordination    Time  12    Period  Weeks    Status  On-going            Plan - 01/30/20 1307    Clinical Impression Statement  Patient pelvis is in correct alignment after manual work. Patient right abdominals was able to contract better. Patient needs increased core bracing with exercise. Patient still has stability of the left pelvis. Patient is using the wand and less tenderness with it. Patient is working  on hip strength. Patient will benefit from skilled therapy to to improve core and SI stability.    Personal Factors and Comorbidities  Behavior Pattern;Comorbidity 2;Past/Current Experience;Time since onset of injury/illness/exacerbation;Social Background    Comorbidities  abnormal EKG Dec. 2019 & Jan. 2020:  Celiac disease:  depression &  anxiety:  GERD:  meningioma with craniotomy 09/2018:  [facility] 2015    Examination-Activity Limitations  Stand;Bend;Locomotion Level;Stairs;Squat;Transfers    Examination-Participation Restrictions  Cleaning;Meal Prep;Community Activity;Shop    Stability/Clinical Decision Making  Evolving/Moderate complexity    Rehab Potential  Good    PT Frequency  1x / week    PT Duration  12 weeks    PT Treatment/Interventions  Neuromuscular re-education;Therapeutic activities;Therapeutic exercise;Patient/family education;Biofeedback;Cryotherapy;Electrical Stimulation;Ultrasound;Moist Heat;Manual techniques;Dry needling    PT Next Visit Plan  bringing the right hip posterior exercise, hip strengthening, soft tissue work to pelvic floor if needed.Check pelvic algnment; check on urine dribble after going to the bathroom    PT Home Exercise Plan  Access Code: [email or web address]    Consulted and Agree with Plan of Care  Patient       Patient will benefit from skilled therapeutic intervention in order to improve the following deficits and impairments:  Decreased coordination, Decreased range of motion, Increased fascial restricitons, Increased muscle spasms, Pain, Decreased strength  Visit Diagnosis: Cramp and spasm  Muscle weakness (generalized)     Problem List Patient Active Problem List   Diagnosis Date Noted  . Anxiety and depression 06/15/2017  . Family history of brain aneurysm 04/19/2017  . Rhinitis, allergic 04/20/2014  . Generalized anxiety disorder 04/20/2014  . Celiac disease 12/15/2012  . Interstitial cystitis     Earlie Counts, PT 01/30/20 1:24 PM   [facility] [address], [place], [place], [address] Phone: [phone]   Fax:  [phone]  Name: Mary Wyatt MRN: [id number] Date of Birth: [birth year]

## 2020-01-31 ENCOUNTER — Ambulatory Visit (INDEPENDENT_AMBULATORY_CARE_PROVIDER_SITE_OTHER): Payer: BC Managed Care – PPO | Admitting: Clinical

## 2020-01-31 DIAGNOSIS — F419 Anxiety disorder, unspecified: Secondary | ICD-10-CM | POA: Diagnosis not present

## 2020-01-31 DIAGNOSIS — N809 Endometriosis, unspecified: Secondary | ICD-10-CM | POA: Diagnosis not present

## 2020-01-31 DIAGNOSIS — M79662 Pain in left lower leg: Secondary | ICD-10-CM | POA: Diagnosis not present

## 2020-01-31 DIAGNOSIS — R0789 Other chest pain: Secondary | ICD-10-CM | POA: Diagnosis not present

## 2020-02-06 ENCOUNTER — Ambulatory Visit: Payer: BC Managed Care – PPO | Admitting: Physical Therapy

## 2020-02-06 ENCOUNTER — Other Ambulatory Visit: Payer: Self-pay

## 2020-02-06 ENCOUNTER — Encounter: Payer: Self-pay | Admitting: Physical Therapy

## 2020-02-06 DIAGNOSIS — M6281 Muscle weakness (generalized): Secondary | ICD-10-CM

## 2020-02-06 DIAGNOSIS — R252 Cramp and spasm: Secondary | ICD-10-CM

## 2020-02-06 NOTE — Therapy (Signed)
[facility] [address] [place], [place], [address] Phone: [phone]   Fax:  [phone]  Physical Therapy Treatment  Patient Details  Name: NNEOMA HARRAL MRN: [id number] Date of Birth: Apr 11, 1986 Referring Provider (PT): Dr. Molli Knock   Encounter Date: 02/06/2020  PT End of Session - 02/06/20 0833    Visit Number  7    Date for PT Re-Evaluation  03/12/20    Authorization Type  BCBS    Authorization - Visit Number  13    Authorization - Number of Visits  30    PT Start Time  0800    PT Stop Time  0840    PT Time Calculation (min)  40 min    Behavior During Therapy  [facility] for tasks assessed/performed       Past Medical History:  Diagnosis Date  . Abnormal EKG    Dec 2019 and Jan 2020/ saw cardiologist  . Allergy   . Anemia   . Anxiety   . Celiac disease   . Depression   . Genital warts   . GERD (gastroesophageal reflux disease)   . Interstitial cystitis   . Meningioma ([place])   . Post-operative nausea and vomiting   . [facility] spotted fever) 2015  . Ulcer     Past Surgical History:  Procedure Laterality Date  . COLONOSCOPY  2013  . CRANIOTOMY  09/2018  . laproscopic surgery     2013 and 2019  . OVARIAN CYST REMOVAL    . TONSILLECTOMY    . UPPER GASTROINTESTINAL ENDOSCOPY  2013    There were no vitals filed for this visit.  Subjective Assessment - 02/06/20 0805    Subjective  I have an area in my back that I get a pain with a stretch. The last 2 stretches I got confused. Abdominal pain decreased by 30% since initial eval    Pertinent History  abnormal EKG Dec. 2019 & Jan. 2020, had a normal EKG in Feb 2021:  Celiac disease:  depression & anxiety:  GERD:  meningioma with craniotomy 09/2018;  [facility] Spotted Fever 2015    Limitations  Standing;Walking;House hold activities    Patient Stated Goals  reduce pelvic floor pain    Currently in Pain?  Yes    Pain  Score  2     Pain Location  Abdomen    Pain Orientation  Right    Pain Descriptors / Indicators  Dull    Pain Type  Chronic pain    Pain Onset  More than a month ago    Pain Frequency  Intermittent    Aggravating Factors   massage, the nustep    Pain Relieving Factors  heat    Multiple Pain Sites  No         [facility] PT Assessment - 02/06/20 0001      Palpation   SI assessment   right asis is anteroirly rotated                Pelvic Floor Special Questions - 02/06/20 0001    Pelvic Floor Internal Exam  Patient confirms identificaiton and approves PT to assess pelvic floor and treatment    Exam Type  Vaginal    Palpation  tightness in the right ischiococcygeus    Strength  weak squeeze, no lift   ant. less contraction than post.        [facility] Adult PT Treatment/Exercise - 02/06/20 0001  Neuro Re-ed    Neuro Re-ed Details   tactile cues to contract the anterior portion of the pelvic floor with control      Lumbar Exercises: Stretches   Hip Flexor Stretch  Right;Left;60 seconds    Hip Flexor Stretch Limitations  foam roll    Other Lumbar Stretch Exercise  roll the foam roll along the thoracic spine and mobilize, lay on side with foam roll on rib cage and stretch lateral trunk      Lumbar Exercises: Supine   Ab Set  10 reps    AB Set Limitations  moving 2# weight in both hands side to side then move weight alternate shoulder flexion    Bridge with Ball Squeeze  20 reps;1 second    Other Supine Lumbar Exercises  hookly posterior glide of the right femur with tactile cues to assist    Other Supine Lumbar Exercises  hookly push a ball into the same side knee while marching with other leg      Manual Therapy   Manual Therapy  Joint mobilization;Muscle Energy Technique;Internal Pelvic Floor    Manual therapy comments  manually stretched right hip flexor and ITB band    Joint Mobilization  PA and rotational to T5-L5    Internal Pelvic Floor  gentle tissue work to the  right ischiococcygeus    Muscle Energy Technique  correct right ilium             PT Education - 02/06/20 0844    Education Details  Access Code: [email or web address]    Person(s) Educated  Patient    Methods  Explanation;Demonstration;Verbal cues;Handout    Comprehension  Returned demonstration;Verbalized understanding       PT Short Term Goals - 02/06/20 0847      PT SHORT TERM GOAL #3   Title  lower abdominal pain decreased </= 4/10 due to improve mobility of pelvic floor tissue    Time  4    Period  Weeks    Status  Achieved    Target Date  01/16/20        PT Long Term Goals - 01/30/20 1322      PT LONG TERM GOAL #4   Title  independent with advanced pelvic floor exericses    Time  12    Period  Weeks    Status  On-going      PT LONG TERM GOAL #5   Title  pain with daily activities decreased </= 2/10 and intermittent due to decreased pelvic floor muscle tension    Time  12    Period  Weeks    Status  On-going      PT LONG TERM GOAL #6   Title  after urinating and standing up no dribbling of urine due to improved pelvic floor coordination    Time  12    Period  Weeks    Status  On-going            Plan - 02/06/20 8032    Clinical Impression Statement  Patient reports her pain is 30% better. Patient still dribbles after she urinates. Pelvic floor strength is 2/5 with post. contraction is stronger than the anterior. Patient pelvic in correct alignment after manual work. Patient has tightness in the right ischiococcygeus but other areas are good.  Patient will benefit from skilled therapy to improve core and SI stability.    Personal Factors and Comorbidities  Behavior Pattern;Comorbidity 2;Past/Current Experience;Time since onset of injury/illness/exacerbation;Social Background  Comorbidities  abnormal EKG Dec. 2019 & Jan. 2020:  Celiac disease:  depression & anxiety:  GERD:  meningioma with craniotomy 09/2018:  [facility]F 2015    Examination-Activity Limitations   Stand;Bend;Locomotion Level;Stairs;Squat;Transfers    Examination-Participation Restrictions  Cleaning;Meal Prep;Community Activity;Shop    Stability/Clinical Decision Making  Evolving/Moderate complexity    Rehab Potential  Good    PT Frequency  1x / week    PT Duration  12 weeks    PT Treatment/Interventions  Neuromuscular re-education;Therapeutic activities;Therapeutic exercise;Patient/family education;Biofeedback;Cryotherapy;Electrical Stimulation;Ultrasound;Moist Heat;Manual techniques;Dry needling    PT Next Visit Plan  work on circular contraction of pelvic floor, work on core strength and hip strength    PT Home Exercise Plan  Access Code: [email or web address]    Consulted and Agree with Plan of Care  Patient       Patient will benefit from skilled therapeutic intervention in order to improve the following deficits and impairments:  Decreased coordination, Decreased range of motion, Increased fascial restricitons, Increased muscle spasms, Pain, Decreased strength  Visit Diagnosis: Cramp and spasm  Muscle weakness (generalized)     Problem List Patient Active Problem List   Diagnosis Date Noted  . Anxiety and depression 06/15/2017  . Family history of brain aneurysm 04/19/2017  . Rhinitis, allergic 04/20/2014  . Generalized anxiety disorder 04/20/2014  . Celiac disease 12/15/2012  . Interstitial cystitis     Earlie Counts, PT 02/06/20 8:48 AM ' [facility] [address], [place], [place], [address] Phone: [phone]   Fax:  [phone]  Name: DEMIKA LANGENDERFER MRN: [id number] Date of Birth: Apr 08, 1986

## 2020-02-06 NOTE — Patient Instructions (Signed)
Access Code: [email or web address] URL: [email or web address] Date: 02/06/2020 Prepared by: Earlie Counts  Program Notes tissue rolling of the abdomen wand on the pelvic floor muscles with pressure on a tomato, hold trigger point for 90 seconds, sweep the wand side to side   Exercises Supine Pelvic Floor Contraction - 3 x daily - 7 x weekly - 1 sets - 5 reps - 3 sec hold Supine Transversus Abdominis Bracing - Hands on Stomach - 1 x daily - 7 x weekly - 3 sets - 10 reps Supine March - 1 x daily - 7 x weekly - 3 sets - 10 reps Supine Bridge with Mini Swiss Ball Between Knees - 1 x daily - 7 x weekly - 1 sets - 15 reps Supine Single Leg Lift - 1 x daily - 7 x weekly - 1 sets - 10 reps Supine Abdominal Wall Massage - 1 x daily - 7 x weekly - 1 reps - 1 sets - 5 min hold Supine Pelvic Floor Stretch - 1 x daily - 7 x weekly - 1 reps - 1 sets - 1-2 min hold Child's Pose Stretch - 1 x daily - 7 x weekly - 1 reps - 1 sets - 30 sec hold Seated Hamstring Stretch - 1 x daily - 7 x weekly - 2 reps - 1 sets - 30 sec hold Seated Piriformis Stretch with Trunk Bend - 1 x daily - 7 x weekly - 2 reps - 1 sets - 30 sec hold Cat-Camel - 1 x daily - 7 x weekly - 10 reps - 1 sets Standing Single Leg Stance with Counter Support - 3 reps - 1 sets - 15 hold Standing Tandem Balance with Counter Support - 3 reps - 1 sets - 15 hold Standing Gaze Stabilization with Head Rotation - 10 reps - 1 sets Romberg Stance Eyes Closed on Foam Pad - 3 reps - 1 sets - 30 hold Wide Stance with Eyes Closed and Head Rotation on Foam Pad - 10 reps - 1 sets Wide Tandem Stance on Foam Pad with Eyes Open - 10 reps - 3 sets [facility] [address], [place], [place] [address] Phone # [phone] Fax [phone]

## 2020-02-07 DIAGNOSIS — M79662 Pain in left lower leg: Secondary | ICD-10-CM | POA: Diagnosis not present

## 2020-02-08 DIAGNOSIS — R42 Dizziness and giddiness: Secondary | ICD-10-CM | POA: Diagnosis not present

## 2020-02-10 DIAGNOSIS — R194 Change in bowel habit: Secondary | ICD-10-CM | POA: Diagnosis not present

## 2020-02-10 DIAGNOSIS — R0789 Other chest pain: Secondary | ICD-10-CM | POA: Diagnosis not present

## 2020-02-10 DIAGNOSIS — K900 Celiac disease: Secondary | ICD-10-CM | POA: Diagnosis not present

## 2020-02-14 ENCOUNTER — Ambulatory Visit: Payer: BC Managed Care – PPO | Admitting: Clinical

## 2020-02-15 DIAGNOSIS — J3089 Other allergic rhinitis: Secondary | ICD-10-CM | POA: Diagnosis not present

## 2020-02-15 DIAGNOSIS — R519 Headache, unspecified: Secondary | ICD-10-CM | POA: Diagnosis not present

## 2020-02-15 DIAGNOSIS — F418 Other specified anxiety disorders: Secondary | ICD-10-CM | POA: Diagnosis not present

## 2020-02-15 DIAGNOSIS — Z9889 Other specified postprocedural states: Secondary | ICD-10-CM | POA: Diagnosis not present

## 2020-02-21 DIAGNOSIS — R194 Change in bowel habit: Secondary | ICD-10-CM | POA: Diagnosis not present

## 2020-02-21 DIAGNOSIS — N809 Endometriosis, unspecified: Secondary | ICD-10-CM | POA: Diagnosis not present

## 2020-02-21 DIAGNOSIS — G8929 Other chronic pain: Secondary | ICD-10-CM | POA: Diagnosis not present

## 2020-02-21 DIAGNOSIS — R1084 Generalized abdominal pain: Secondary | ICD-10-CM | POA: Diagnosis not present

## 2020-02-22 ENCOUNTER — Encounter: Payer: Self-pay | Admitting: Physical Therapy

## 2020-02-22 ENCOUNTER — Ambulatory Visit: Payer: BC Managed Care – PPO | Attending: Student | Admitting: Physical Therapy

## 2020-02-22 ENCOUNTER — Other Ambulatory Visit: Payer: Self-pay

## 2020-02-22 DIAGNOSIS — M6281 Muscle weakness (generalized): Secondary | ICD-10-CM | POA: Diagnosis not present

## 2020-02-22 DIAGNOSIS — R252 Cramp and spasm: Secondary | ICD-10-CM | POA: Insufficient documentation

## 2020-02-22 NOTE — Patient Instructions (Signed)
Access Code: [email or web address] URL: [email or web address] Date: 02/06/2020 Prepared by: Earlie Counts  Program Notes tissue rolling of the abdomen wand on the pelvic floor muscles with pressure on a tomato, hold trigger point for 90 seconds, sweep the wand side to side   Exercises Quadruped Alternating Leg Extensions - 1 x daily - 7 x weekly - 1 sets - 10 reps Quadruped Alternating Arm Lift - 1 x daily - 7 x weekly - 1 sets - 10 reps Supine Pelvic Floor Contraction - 3 x daily - 7 x weekly - 1 sets - 5 reps - 5 sec hold Supine Transversus Abdominis Bracing - Hands on Stomach - 1 x daily - 7 x weekly - 1 sets - 10 reps Supine March - 1 x daily - 7 x weekly - 1 sets - 10 reps Supine Bridge with Mini Swiss Ball Between Knees - 1 x daily - 7 x weekly - 1 sets - 15 reps Supine Single Leg Lift - 1 x daily - 7 x weekly - 1 sets - 10 reps Supine Abdominal Wall Massage - 1 x daily - 7 x weekly - 1 reps - 1 sets - 5 min hold Supine Pelvic Floor Stretch - 1 x daily - 7 x weekly - 1 reps - 1 sets - 1-2 min hold Child's Pose Stretch - 1 x daily - 7 x weekly - 1 reps - 1 sets - 30 sec hold Seated Hamstring Stretch - 1 x daily - 7 x weekly - 2 reps - 1 sets - 30 sec hold Seated Piriformis Stretch with Trunk Bend - 1 x daily - 7 x weekly - 2 reps - 1 sets - 30 sec hold Cat-Camel - 1 x daily - 7 x weekly - 10 reps - 1 sets Standing Single Leg Stance with Counter Support - 3 reps - 1 sets - 15 hold Standing Tandem Balance with Counter Support - 3 reps - 1 sets - 15 hold Standing Gaze Stabilization with Head Rotation - 10 reps - 1 sets Romberg Stance Eyes Closed on Foam Pad - 3 reps - 1 sets - 30 hold Wide Stance with Eyes Closed and Head Rotation on Foam Pad - 10 reps - 1 sets Wide Tandem Stance on Foam Pad with Eyes Open - 10 reps - 3 sets [facility] [address] [place], [place] [address] Phone # [phone] Fax [phone]

## 2020-02-22 NOTE — Therapy (Signed)
[facility] [address], [place], [place], [address] Phone: [phone]   Fax:  [phone]  Physical Therapy Treatment  Patient Details  Name: Mary Wyatt MRN: [id number] Date of Birth: [birth year] Referring Provider (PT): Dr. Molli Knock   Encounter Date: 02/22/2020  PT End of Session - 02/22/20 1323    Visit Number  8    Date for PT Re-Evaluation  03/12/20    Authorization Type  BCBS    Authorization - Visit Number  14    Authorization - Number of Visits  30    PT Start Time  9628    PT Stop Time  1315    PT Time Calculation (min)  40 min    Activity Tolerance  Patient tolerated treatment well    Behavior During Therapy  [facility] for tasks assessed/performed       Past Medical History:  Diagnosis Date  . Abnormal EKG    Dec 2019 and Jan 2020/ saw cardiologist  . Allergy   . Anemia   . Anxiety   . Celiac disease   . Depression   . Genital warts   . GERD (gastroesophageal reflux disease)   . Interstitial cystitis   . Meningioma ([place])   . Post-operative nausea and vomiting   . [facility] spotted fever) 2015  . Ulcer     Past Surgical History:  Procedure Laterality Date  . COLONOSCOPY  2013  . CRANIOTOMY  09/2018  . laproscopic surgery     2013 and 2019  . OVARIAN CYST REMOVAL    . TONSILLECTOMY    . UPPER GASTROINTESTINAL ENDOSCOPY  2013    There were no vitals filed for this visit.  Subjective Assessment - 02/22/20 1237    Subjective  I saw a rectal surgeon and want me to do a endorectal ultrasound for endometriosis. I saw a GI doctor and PCP. I saw urogynecologist and feels the episodes of urinary frequency is from her IC.    Pertinent History  abnormal EKG Dec. 2019 & Jan. 2020, had a normal EKG in Feb 2021:  Celiac disease:  depression & anxiety:  GERD:  meningioma with craniotomy 09/2018;  [facility] Spotted Fever 2015    Limitations  Standing;Walking;House hold  activities    Patient Stated Goals  reduce pelvic floor pain    Currently in Pain?  Yes    Pain Score  3     Pain Location  Abdomen    Pain Orientation  Right    Pain Descriptors / Indicators  Dull    Pain Type  Chronic pain    Pain Onset  More than a month ago    Pain Frequency  Intermittent    Aggravating Factors   massage, the nustep    Pain Relieving Factors  heat    Multiple Pain Sites  No         [facility] PT Assessment - 02/22/20 0001      Palpation   SI assessment   ASIS are equal                   [facility] Adult PT Treatment/Exercise - 02/22/20 0001      Lumbar Exercises: Stretches   Hip Flexor Stretch  Right;Left;30 seconds    Hip Flexor Stretch Limitations  sitting on the edge of the mat      Lumbar Exercises: Aerobic   Stationary Bike  6 minutes on level 2 while  assessing patients goals       Lumbar Exercises: Supine   Bent Knee Raise  10 reps;1 second    Bent Knee Raise Limitations  tactile cues for abdominal bracing      Lumbar Exercises: Quadruped   Single Arm Raise  Right;Left;10 reps;1 second    Single Arm Raise Weights (lbs)  VC on spinal neutral    Straight Leg Raise  20 reps;1 second    Straight Leg Raises Limitations  10 right, 10 left; VC on spinal neutra;      Manual Therapy   Manual Therapy  Joint mobilization;Soft tissue mobilization;Myofascial release    Joint Mobilization  gapping of the TL junction on the left    Soft tissue mobilization  left quadratus    Myofascial Release  to right lower quadrant to release the tight fascia, patient could feel the pulling on the area and pain was reduced after technique             PT Education - 02/22/20 1301    Education Details  Access Code: [email or web address]    Person(s) Educated  Patient    Methods  Explanation;Demonstration;Verbal cues;Handout    Comprehension  Returned demonstration;Verbalized understanding       PT Short Term Goals - 02/06/20 0847      PT SHORT TERM GOAL #3   Title   lower abdominal pain decreased </= 4/10 due to improve mobility of pelvic floor tissue    Time  4    Period  Weeks    Status  Achieved    Target Date  01/16/20        PT Long Term Goals - 02/22/20 1350      PT LONG TERM GOAL #4   Title  independent with advanced pelvic floor exericses    Time  12    Period  Weeks    Status  On-going      PT LONG TERM GOAL #5   Title  pain with daily activities decreased </= 2/10 and intermittent due to decreased pelvic floor muscle tension    Time  12    Period  Weeks    Status  On-going      PT LONG TERM GOAL #6   Title  after urinating and standing up no dribbling of urine due to improved pelvic floor coordination    Period  Weeks    Status  On-going            Plan - 02/22/20 1253    Clinical Impression Statement  Patient sometimes has urgency with urination. Patient feels sometimes she will not fully empty her rectum. Patient pain in right lower abdominal pain was reduced after myofascial release. Patient has tightness in the thoracolumbar unction and right quadratus. Patient stands with hips forward, increased bulge fo the lower abdomen and extension of the thoracolumbar junction. Patient back will get tired after walking for 1.5 miles. Patient will benefit from skilled therapy to imporve core and SI stability.    Personal Factors and Comorbidities  Behavior Pattern;Comorbidity 2;Past/Current Experience;Time since onset of injury/illness/exacerbation;Social Background    Comorbidities  abnormal EKG Dec. 2019 & Jan. 2020:  Celiac disease:  depression & anxiety:  GERD:  meningioma with craniotomy 09/2018:  RMSF 2015    Examination-Activity Limitations  Stand;Bend;Locomotion Level;Stairs;Squat;Transfers    Examination-Participation Restrictions  Cleaning;Meal Prep;Community Activity;Shop    Stability/Clinical Decision Making  Evolving/Moderate complexity    Rehab Potential  Good    PT Frequency  1x /  week    PT Duration  12 weeks    PT  Treatment/Interventions  Neuromuscular re-education;Therapeutic activities;Therapeutic exercise;Patient/family education;Biofeedback;Cryotherapy;Electrical Stimulation;Ultrasound;Moist Heat;Manual techniques;Dry needling    PT Next Visit Plan  manual work to the pelvic floor; work on lower trap to bring shoulders downward, work on lower abdominal contraction; dribbling after urination    PT Home Exercise Plan  Access Code: [email or web address]    Consulted and Agree with Plan of Care  Patient       Patient will benefit from skilled therapeutic intervention in order to improve the following deficits and impairments:  Decreased coordination, Decreased range of motion, Increased fascial restricitons, Increased muscle spasms, Pain, Decreased strength  Visit Diagnosis: Cramp and spasm  Muscle weakness (generalized)     Problem List Patient Active Problem List   Diagnosis Date Noted  . Anxiety and depression 06/15/2017  . Family history of brain aneurysm 04/19/2017  . Rhinitis, allergic 04/20/2014  . Generalized anxiety disorder 04/20/2014  . Celiac disease 12/15/2012  . Interstitial cystitis     Earlie Counts, PT 02/22/20 1:53 PM   [facility] [address], [place], [place], [address] Phone: [phone]   Fax:  [phone]  Name: Mary Wyatt MRN: [id number] Date of Birth: 1986/04/28

## 2020-02-27 ENCOUNTER — Other Ambulatory Visit: Payer: Self-pay

## 2020-02-27 ENCOUNTER — Encounter: Payer: Self-pay | Admitting: Physical Therapy

## 2020-02-27 ENCOUNTER — Ambulatory Visit: Payer: BC Managed Care – PPO | Admitting: Physical Therapy

## 2020-02-27 DIAGNOSIS — M6281 Muscle weakness (generalized): Secondary | ICD-10-CM

## 2020-02-27 DIAGNOSIS — R252 Cramp and spasm: Secondary | ICD-10-CM

## 2020-02-27 NOTE — Therapy (Signed)
[facility] [address] [place], [place], [address] Phone: [phone]   Fax:  [phone]  Physical Therapy Treatment  Patient Details  Name: Mary Wyatt MRN: [id number] Date of Birth: [birth year] Referring Provider (PT): Dr. Molli Knock   Encounter Date: 02/27/2020  PT End of Session - 02/27/20 1528    Visit Number  9    Date for PT Re-Evaluation  03/12/20    Authorization Type  BCBS    Authorization - Visit Number  15    Authorization - Number of Visits  30    PT Start Time  3254    PT Stop Time  1525    PT Time Calculation (min)  40 min    Activity Tolerance  Patient tolerated treatment well    Behavior During Therapy  [facility] for tasks assessed/performed       Past Medical History:  Diagnosis Date  . Abnormal EKG    Dec 2019 and Jan 2020/ saw cardiologist  . Allergy   . Anemia   . Anxiety   . Celiac disease   . Depression   . Genital warts   . GERD (gastroesophageal reflux disease)   . Interstitial cystitis   . Meningioma ([place])   . Post-operative nausea and vomiting   . [facility] spotted fever) 2015  . Ulcer     Past Surgical History:  Procedure Laterality Date  . COLONOSCOPY  2013  . CRANIOTOMY  09/2018  . laproscopic surgery     2013 and 2019  . OVARIAN CYST REMOVAL    . TONSILLECTOMY    . UPPER GASTROINTESTINAL ENDOSCOPY  2013    There were no vitals filed for this visit.  Subjective Assessment - 02/27/20 1456    Subjective  Dribbling after urination is 20% better. I have less pain today. Something in my stomach is not feeling right. I feel tied up today in my abdomen. When I stand up straight, I have a pain in my low back.    Pertinent History  abnormal EKG Dec. 2019 & Jan. 2020, had a normal EKG in Feb 2021:  Celiac disease:  depression & anxiety:  GERD:  meningioma with craniotomy 09/2018;  [facility] Spotted Fever 2015    Limitations   Standing;Walking;House hold activities    Patient Stated Goals  reduce pelvic floor pain    Currently in Pain?  Yes    Pain Score  2     Pain Location  Abdomen    Pain Orientation  Right    Pain Descriptors / Indicators  Dull    Pain Type  Chronic pain    Pain Onset  More than a month ago    Pain Frequency  Intermittent    Aggravating Factors   massage, nustep    Pain Relieving Factors  heat    Multiple Pain Sites  No                       [facility] Adult PT Treatment/Exercise - 02/27/20 0001      Lumbar Exercises: Aerobic   Stationary Bike  6 minutes on level 2 while assessing patients goals       Lumbar Exercises: Supine   Other Supine Lumbar Exercises  legs up in the air and toe touch with abdominal contraction      Lumbar Exercises: Quadruped   Single Arm Raise  Right;Left;10 reps;1 second    Single Arm Raise  Weights (lbs)  VC on spinal neutral    Straight Leg Raise  20 reps;1 second    Straight Leg Raises Limitations  10 right, 10 left; VC on spinal neutra;      Knee/Hip Exercises: Sidelying   Other Sidelying Knee/Hip Exercises  modified side plank with elbow extended 10 times each side      Manual Therapy   Manual Therapy  Myofascial release;Soft tissue mobilization    Soft tissue mobilization  circular massage to the abdomen to promotel peristalic motion of the intestines    Myofascial Release  fascial work around the umbilicus to release trigger points and area of tightness             PT Education - 02/27/20 1515    Education Details  Access Code: [email or web address]    Person(s) Educated  Patient    Methods  Explanation;Demonstration;Verbal cues;Handout    Comprehension  Verbalized understanding;Returned demonstration       PT Short Term Goals - 02/06/20 0847      PT SHORT TERM GOAL #3   Title  lower abdominal pain decreased </= 4/10 due to improve mobility of pelvic floor tissue    Time  4    Period  Weeks    Status  Achieved    Target Date   01/16/20        PT Long Term Goals - 02/27/20 1458      PT LONG TERM GOAL #4   Title  independent with advanced pelvic floor exericses    Time  12    Period  Weeks    Status  On-going      PT LONG TERM GOAL #5   Title  pain with daily activities decreased </= 2/10 and intermittent due to decreased pelvic floor muscle tension    Baseline  2-3/10    Time  12    Period  Weeks    Status  On-going      PT LONG TERM GOAL #6   Title  after urinating and standing up no dribbling of urine due to improved pelvic floor coordination    Time  12    Period  Weeks    Status  On-going            Plan - 02/27/20 1507    Clinical Impression Statement  Patient is stronger in her core and needed advanced exercises today. Patient reports dribbling after urination is 205 better. Pelvic pain is 45% better. Patient felt her abdomen was sluggish and is glad she is having an ultrasound toward the end of May. Patient will benefit from skilled therapy to improve core and SI stability.    Personal Factors and Comorbidities  Behavior Pattern;Comorbidity 2;Past/Current Experience;Time since onset of injury/illness/exacerbation;Social Background    Comorbidities  abnormal EKG Dec. 2019 & Jan. 2020:  Celiac disease:  depression & anxiety:  GERD:  meningioma with craniotomy 09/2018:  RMSF 2015    Examination-Activity Limitations  Stand;Bend;Locomotion Level;Stairs;Squat;Transfers    Examination-Participation Restrictions  Cleaning;Meal Prep;Community Activity;Shop    Stability/Clinical Decision Making  Evolving/Moderate complexity    Rehab Potential  Good    PT Frequency  1x / week    PT Duration  12 weeks    PT Treatment/Interventions  Neuromuscular re-education;Therapeutic activities;Therapeutic exercise;Patient/family education;Biofeedback;Cryotherapy;Electrical Stimulation;Ultrasound;Moist Heat;Manual techniques;Dry needling    PT Next Visit Plan  progress exercises in 1/2 kneel; internal pelvic floor  work due to some pain vaginally    PT Home Exercise Plan  Access  Code: [email or web address]    Consulted and Agree with Plan of Care  Patient       Patient will benefit from skilled therapeutic intervention in order to improve the following deficits and impairments:  Decreased coordination, Decreased range of motion, Increased fascial restricitons, Increased muscle spasms, Pain, Decreased strength  Visit Diagnosis: Cramp and spasm  Muscle weakness (generalized)     Problem List Patient Active Problem List   Diagnosis Date Noted  . Anxiety and depression 06/15/2017  . Family history of brain aneurysm 04/19/2017  . Rhinitis, allergic 04/20/2014  . Generalized anxiety disorder 04/20/2014  . Celiac disease 12/15/2012  . Interstitial cystitis     Earlie Counts, PT 02/27/20 3:31 PM   [facility] [address] [place], [place], [address] Phone: [phone]   Fax:  [phone]  Name: YASLIN KIRTLEY MRN: [id number] Date of Birth: [birth year]

## 2020-02-27 NOTE — Patient Instructions (Signed)
Access Code: [email or web address] URL: [email or web address] Date: 02/06/2020 Prepared by: Earlie Counts  Program Notes tissue rolling of the abdomen wand on the pelvic floor muscles with pressure on a tomato, hold trigger point for 90 seconds, sweep the wand side to side   Exercises Quadruped Alternating Leg Extensions - 1 x daily - 7 x weekly - 1 sets - 10 reps Quadruped Alternating Arm Lift - 1 x daily - 7 x weekly - 1 sets - 10 reps Supine Pelvic Floor Contraction - 3 x daily - 7 x weekly - 1 sets - 5 reps - 5 sec hold Supine Transversus Abdominis Bracing - Hands on Stomach - 1 x daily - 7 x weekly - 1 sets - 10 reps Supine March - 1 x daily - 7 x weekly - 1 sets - 10 reps Supine Bridge with Mini Swiss Ball Between Knees - 1 x daily - 7 x weekly - 1 sets - 15 reps Supine Single Leg Lift - 1 x daily - 7 x weekly - 1 sets - 10 reps Supine Abdominal Wall Massage - 1 x daily - 7 x weekly - 1 reps - 1 sets - 5 min hold Supine Pelvic Floor Stretch - 1 x daily - 7 x weekly - 1 reps - 1 sets - 1-2 min hold Child's Pose Stretch - 1 x daily - 7 x weekly - 1 reps - 1 sets - 30 sec hold Seated Hamstring Stretch - 1 x daily - 7 x weekly - 2 reps - 1 sets - 30 sec hold Seated Piriformis Stretch with Trunk Bend - 1 x daily - 7 x weekly - 2 reps - 1 sets - 30 sec hold Cat-Camel - 1 x daily - 7 x weekly - 10 reps - 1 sets Standing Single Leg Stance with Counter Support - 3 reps - 1 sets - 15 hold Standing Tandem Balance with Counter Support - 3 reps - 1 sets - 15 hold Standing Gaze Stabilization with Head Rotation - 10 reps - 1 sets Romberg Stance Eyes Closed on Foam Pad - 3 reps - 1 sets - 30 hold Wide Stance with Eyes Closed and Head Rotation on Foam Pad - 10 reps - 1 sets Wide Tandem Stance on Foam Pad with Eyes Open - 10 reps - 3 sets Supine 90/90 Abdominal Bracing - 1 x daily - 7 x weekly - 2 sets - 10 reps Side Plank on Knees - 1 x daily - 7 x weekly - 1 sets - 10 reps [facility] [address] [place], [place] [address] Phone # [phone] Fax [phone]

## 2020-02-28 ENCOUNTER — Ambulatory Visit: Payer: BC Managed Care – PPO | Admitting: Clinical

## 2020-03-01 ENCOUNTER — Ambulatory Visit (INDEPENDENT_AMBULATORY_CARE_PROVIDER_SITE_OTHER): Payer: BC Managed Care – PPO | Admitting: Clinical

## 2020-03-01 DIAGNOSIS — F411 Generalized anxiety disorder: Secondary | ICD-10-CM

## 2020-03-05 ENCOUNTER — Other Ambulatory Visit: Payer: Self-pay

## 2020-03-05 ENCOUNTER — Ambulatory Visit: Payer: BC Managed Care – PPO | Admitting: Physical Therapy

## 2020-03-05 ENCOUNTER — Encounter: Payer: Self-pay | Admitting: Physical Therapy

## 2020-03-05 DIAGNOSIS — M6281 Muscle weakness (generalized): Secondary | ICD-10-CM | POA: Diagnosis not present

## 2020-03-05 DIAGNOSIS — R252 Cramp and spasm: Secondary | ICD-10-CM

## 2020-03-05 NOTE — Therapy (Signed)
[facility] [address], [place], [place], [address] Phone: [phone]   Fax:  [phone]  Physical Therapy Treatment  Patient Details  Name: Mary Wyatt MRN: [id number] Date of Birth: 1986/02/27 Referring Provider (Mary Wyatt): Dr. Molli Knock   Encounter Date: 03/05/2020  Mary Wyatt End of Session - 03/05/20 0810    Visit Number  10    Date for Mary Wyatt Re-Evaluation  03/12/20    Authorization Type  BCBS    Authorization - Visit Number  16    Authorization - Number of Visits  30    Mary Wyatt Start Time  0800    Mary Wyatt Stop Time  0840    Mary Wyatt Time Calculation (min)  40 min    Activity Tolerance  Patient tolerated treatment well    Behavior During Therapy  [facility] for tasks assessed/performed       Past Medical History:  Diagnosis Date  . Abnormal EKG    Dec 2019 and Jan 2020/ saw cardiologist  . Allergy   . Anemia   . Anxiety   . Celiac disease   . Depression   . Genital warts   . GERD (gastroesophageal reflux disease)   . Interstitial cystitis   . Meningioma ([place])   . Post-operative nausea and vomiting   . RMSF [facility] spotted fever) 2015  . Ulcer     Past Surgical History:  Procedure Laterality Date  . COLONOSCOPY  2013  . CRANIOTOMY  09/2018  . laproscopic surgery     2013 and 2019  . OVARIAN CYST REMOVAL    . TONSILLECTOMY    . UPPER GASTROINTESTINAL ENDOSCOPY  2013    There were no vitals filed for this visit.  Subjective Assessment - 03/05/20 0806    Subjective  I still have some dribbling when I stand up from the commode.  One stretch I have a hard time with.    Pertinent History  abnormal EKG Dec. 2019 & Jan. 2020, had a normal EKG in Feb 2021:  Celiac disease:  depression & anxiety:  GERD:  meningioma with craniotomy 09/2018;  [facility] Spotted Fever 2015    Patient Stated Goals  reduce pelvic floor pain    Currently in Pain?  Yes    Pain Score  2     Pain Location  Abdomen    Pain  Orientation  Right    Pain Descriptors / Indicators  Dull    Pain Type  Chronic pain    Pain Onset  More than a month ago    Pain Frequency  Intermittent    Aggravating Factors   massage, nustep    Pain Relieving Factors  heat    Multiple Pain Sites  No         [facility] Mary Wyatt Assessment - 03/05/20 0001      Assessment   Medical Diagnosis  M79.18 Myalgia of pelvic floor    Referring Provider (Mary Wyatt)  Dr. Molli Knock    Onset Date/Surgical Date  09/20/11    Prior Therapy  none for pelvic floor      Precautions   Precautions  None      [place]                 Pelvic Floor Special Questions - 03/05/20 0001    Pelvic Floor Internal Exam  Patient confirms identificaiton and approves Mary Wyatt to assess pelvic floor and treatment  Exam Type  Vaginal    Palpation  tightness in the right ovary area    Strength  fair squeeze, definite lift        [facility] Adult Mary Wyatt Treatment/Exercise - 03/05/20 0001      Neuro Re-ed    Neuro Re-ed Details   pelvic floor contraction with gloved index finger to give tactile cues to contract the pelvic floor, then pelvic floor contraction with alternate shoulder flexion,       Lumbar Exercises: Stretches   Hip Flexor Stretch  Right;1 rep;60 seconds    Hip Flexor Stretch Limitations  sitting      Lumbar Exercises: Aerobic   Nustep  6 minutes, level 3 seat 6 while assessing patient       Lumbar Exercises: Quadruped   Other Quadruped Lumbar Exercises  1/2 kneel with pelvic floor contraction holding 5 sec. 5 times each side; then move arms to 90 degree shoulder flexion 5x3      Manual Therapy   Manual Therapy  Internal Pelvic Floor;Muscle Energy Technique    Internal Pelvic Floor  fascial release to the right  anterior pelvic floor with other hand on the outside to release the tightness, stroke of the left introitus to improve circular contraction    Muscle Energy Technique  correct right ilium              Mary Wyatt Education - 03/05/20 0835    Education Details  Access Code: [email or web address]    Person(s) Educated  Patient    Methods  Explanation;Demonstration;Verbal cues;Handout    Comprehension  Verbalized understanding;Returned demonstration       Mary Wyatt Short Term Goals - 02/06/20 0847      Mary Wyatt SHORT TERM GOAL #3   Title  lower abdominal pain decreased </= 4/10 due to improve mobility of pelvic floor tissue    Time  4    Period  Weeks    Status  Achieved    Target Date  01/16/20        Mary Wyatt Long Term Goals - 03/05/20 0842      Mary Wyatt LONG TERM GOAL #4   Title  independent with advanced pelvic floor exericses    Baseline  still learning    Time  12    Period  Weeks    Status  On-going      Mary Wyatt LONG TERM GOAL #5   Title  pain with daily activities decreased </= 2/10 and intermittent due to decreased pelvic floor muscle tension    Baseline  2-3/10 in the right lower abdominals    Time  12    Period  Weeks    Status  On-going      Mary Wyatt LONG TERM GOAL #6   Title  after urinating and standing up no dribbling of urine due to improved pelvic floor coordination    Time  12    Period  Weeks    Status  On-going            Plan - 03/05/20 0810    Clinical Impression Statement  Pelvic floor strength is 3/5 with lift and circular contraction. Patient reports she will dribble when she stands from the commode after she urinates. Patient has pinpoint pain in the right anterior wall of the pelvic floor and right hip flexor. After manual work her pelvis was in correct alignment. Patient will benefit from skilled therapy to improve core and SI stability.    Personal Factors and Comorbidities  Behavior Pattern;Comorbidity 2;Past/Current  Experience;Time since onset of injury/illness/exacerbation;Social Background    Comorbidities  abnormal EKG Dec. 2019 & Jan. 2020:  Celiac disease:  depression & anxiety:  GERD:  meningioma with craniotomy 09/2018:  RMSF 2015    Examination-Activity  Limitations  Stand;Bend;Locomotion Level;Stairs;Squat;Transfers    Examination-Participation Restrictions  Cleaning;Meal Prep;Community Activity;Shop    Stability/Clinical Decision Making  Evolving/Moderate complexity    Rehab Potential  Good    Mary Wyatt Frequency  1x / week    Mary Wyatt Duration  12 weeks    Mary Wyatt Treatment/Interventions  Neuromuscular re-education;Therapeutic activities;Therapeutic exercise;Patient/family education;Biofeedback;Cryotherapy;Electrical Stimulation;Ultrasound;Moist Heat;Manual techniques;Dry needling    Mary Wyatt Next Visit Plan  work in 1/2 kneel with pelvic floor, manual work to right lower abdominal,    Mary Wyatt Home Exercise Plan  Access Code: [email or web address]    Consulted and Agree with Plan of Care  Patient       Patient will benefit from skilled therapeutic intervention in order to improve the following deficits and impairments:  Decreased coordination, Decreased range of motion, Increased fascial restricitons, Increased muscle spasms, Pain, Decreased strength  Visit Diagnosis: Cramp and spasm  Muscle weakness (generalized)     Problem List Patient Active Problem List   Diagnosis Date Noted  . Anxiety and depression 06/15/2017  . Family history of brain aneurysm 04/19/2017  . Rhinitis, allergic 04/20/2014  . Generalized anxiety disorder 04/20/2014  . Celiac disease 12/15/2012  . Interstitial cystitis     Mary Wyatt, Mary Wyatt 03/05/20 8:44 AM   [facility] [address], [place], [place], [address] Phone: [phone]   Fax:  [phone]  Name: Mary Wyatt MRN: [id number] Date of Birth: 1986-07-13

## 2020-03-05 NOTE — Patient Instructions (Signed)
Access Code: [email or web address] URL: [email or web address] Date: 02/06/2020 Prepared by: Earlie Counts  Exercises Seated Hip Flexor Stretch - 1 x daily - 7 x weekly - 3 sets - 10 reps [facility] [address], [place], [place] [address] Phone # [phone] Fax [phone]

## 2020-03-07 DIAGNOSIS — J321 Chronic frontal sinusitis: Secondary | ICD-10-CM | POA: Diagnosis not present

## 2020-03-07 DIAGNOSIS — N898 Other specified noninflammatory disorders of vagina: Secondary | ICD-10-CM | POA: Diagnosis not present

## 2020-03-07 DIAGNOSIS — R300 Dysuria: Secondary | ICD-10-CM | POA: Diagnosis not present

## 2020-03-07 DIAGNOSIS — F418 Other specified anxiety disorders: Secondary | ICD-10-CM | POA: Diagnosis not present

## 2020-03-09 DIAGNOSIS — Z20822 Contact with and (suspected) exposure to covid-19: Secondary | ICD-10-CM | POA: Diagnosis not present

## 2020-03-12 ENCOUNTER — Ambulatory Visit: Payer: BC Managed Care – PPO | Admitting: Physical Therapy

## 2020-03-13 ENCOUNTER — Ambulatory Visit: Payer: BC Managed Care – PPO | Admitting: Clinical

## 2020-03-15 ENCOUNTER — Encounter: Payer: BC Managed Care – PPO | Admitting: Physical Therapy

## 2020-03-15 DIAGNOSIS — K625 Hemorrhage of anus and rectum: Secondary | ICD-10-CM | POA: Diagnosis not present

## 2020-03-15 DIAGNOSIS — N809 Endometriosis, unspecified: Secondary | ICD-10-CM | POA: Diagnosis not present

## 2020-03-15 DIAGNOSIS — R194 Change in bowel habit: Secondary | ICD-10-CM | POA: Diagnosis not present

## 2020-03-15 DIAGNOSIS — R1084 Generalized abdominal pain: Secondary | ICD-10-CM | POA: Diagnosis not present

## 2020-03-15 DIAGNOSIS — G8929 Other chronic pain: Secondary | ICD-10-CM | POA: Diagnosis not present

## 2020-03-21 DIAGNOSIS — N946 Dysmenorrhea, unspecified: Secondary | ICD-10-CM | POA: Diagnosis not present

## 2020-03-22 DIAGNOSIS — N809 Endometriosis, unspecified: Secondary | ICD-10-CM | POA: Diagnosis not present

## 2020-03-22 DIAGNOSIS — M7918 Myalgia, other site: Secondary | ICD-10-CM | POA: Diagnosis not present

## 2020-03-22 DIAGNOSIS — R194 Change in bowel habit: Secondary | ICD-10-CM | POA: Diagnosis not present

## 2020-03-22 DIAGNOSIS — D329 Benign neoplasm of meninges, unspecified: Secondary | ICD-10-CM | POA: Diagnosis not present

## 2020-03-27 ENCOUNTER — Ambulatory Visit (INDEPENDENT_AMBULATORY_CARE_PROVIDER_SITE_OTHER): Payer: BC Managed Care – PPO | Admitting: Clinical

## 2020-03-27 DIAGNOSIS — F419 Anxiety disorder, unspecified: Secondary | ICD-10-CM

## 2020-03-28 DIAGNOSIS — D259 Leiomyoma of uterus, unspecified: Secondary | ICD-10-CM | POA: Diagnosis not present

## 2020-04-02 ENCOUNTER — Ambulatory Visit: Payer: BC Managed Care – PPO | Admitting: Physical Therapy

## 2020-04-04 ENCOUNTER — Other Ambulatory Visit: Payer: Self-pay

## 2020-04-04 ENCOUNTER — Ambulatory Visit: Payer: BC Managed Care – PPO | Attending: Student | Admitting: Physical Therapy

## 2020-04-04 ENCOUNTER — Encounter: Payer: Self-pay | Admitting: Physical Therapy

## 2020-04-04 DIAGNOSIS — M6281 Muscle weakness (generalized): Secondary | ICD-10-CM | POA: Insufficient documentation

## 2020-04-04 DIAGNOSIS — R252 Cramp and spasm: Secondary | ICD-10-CM | POA: Insufficient documentation

## 2020-04-04 NOTE — Therapy (Signed)
[facility] [address], [place], [place], [address] Phone: [phone]   Fax:  [phone]  Physical Therapy Treatment  Patient Details  Name: Mary Wyatt MRN: [id number] Date of Birth: 07-14-1986 Referring Provider (PT): Dr. Molli Knock   Encounter Date: 04/04/2020   PT End of Session - 04/04/20 1410    Visit Number [facility] - Visit Number 91    Authorization - Number of Visits 30    PT Start Time 1400    PT Stop Time 1438    PT Time Calculation (min) 38 min    Activity Tolerance Patient tolerated treatment well    Behavior During Therapy [facility] for tasks assessed/performed           Past Medical History:  Diagnosis Date  . Abnormal EKG    Dec 2019 and Jan 2020/ saw cardiologist  . Allergy   . Anemia   . Anxiety   . Celiac disease   . Depression   . Genital warts   . GERD (gastroesophageal reflux disease)   . Interstitial cystitis   . Meningioma ([place])   . Post-operative nausea and vomiting   . RMSF [facility] spotted fever) 2015  . Ulcer     Past Surgical History:  Procedure Laterality Date  . COLONOSCOPY  2013  . CRANIOTOMY  09/2018  . laproscopic surgery     2013 and 2019  . OVARIAN CYST REMOVAL    . TONSILLECTOMY    . UPPER GASTROINTESTINAL ENDOSCOPY  2013    There were no vitals filed for this visit.   Subjective Assessment - 04/04/20 1405    Subjective When I did the pelvic floor contractions several times per day they caused pain and now the pain is better. I just started my stretches. I was on new anti-depressant mediction. I still feel tied up in my abdomen. Rectal [place] was negative. I had a pelvic MRI with fibroids showing. It is up to me to have endometriosis surgery.    Pertinent History abnormal EKG Dec. 2019 & Jan. 2020, had a normal EKG in Feb 2021:  Celiac disease:  depression & anxiety:  GERD:  meningioma with craniotomy  09/2018;  [facility] Spotted Fever 2015    Limitations Standing;Walking;House hold activities    Patient Stated Goals reduce pelvic floor pain    Currently in Pain? Yes    Pain Score 2     Pain Location Abdomen    Pain Orientation Mid;Right    Pain Descriptors / Indicators Discomfort    Pain Type Chronic pain    Pain Onset More than a month ago    Pain Frequency Constant    Aggravating Factors  connected to bowel movements, trouble evacuating bowels and get nauseau    Pain Relieving Factors heat    Multiple Pain Sites No              [facility] PT Assessment - 04/04/20 0001      Assessment   Medical Diagnosis M79.18 Myalgia of pelvic floor    Referring Provider (PT) Dr. Molli Knock    Onset Date/Surgical Date 09/20/11    Prior Therapy none for pelvic floor      Precautions   Precautions None      [place] residence      Cognition   Overall Cognitive Status Within Functional Limits for tasks assessed  Posture/Postural Control   Posture/Postural Control No significant limitations      ROM / Strength   AROM / PROM / Strength AROM;PROM;Strength      AROM   Lumbar Extension full     Lumbar - Right Side Bend fuul    Lumbar - Left Side Bend full      Strength   Right Hip Flexion 4+/5    Right Hip External Rotation  5/5    Right Hip Internal Rotation 5/5    Right Hip ABduction 5/5    Left Hip Flexion 4+/5    Left Hip External Rotation 5/5    Left Hip Internal Rotation 5/5    Left Hip ABduction 5/5      Palpation   SI assessment  ASIS are equal                         [facility] Adult PT Treatment/Exercise - 04/04/20 0001      Self-Care   Self-Care Other Self-Care Comments    Other Self-Care Comments  purchase a suction cup and foam roller to work on elongating the tissue      Exercises   Exercises Other Exercises    Other Exercises  went over HEP and looked at whick exercises can be changed,  combined and ones she prefers to do      Lumbar Exercises: Aerobic   Stationary Bike 7 minutes on level 2 while assessing patients goals       Lumbar Exercises: Supine   Ab Set 10 reps      Lumbar Exercises: Quadruped   Opposite Arm/Leg Raise Right arm/Left leg;Left arm/Right leg;10 reps      Manual Therapy   Manual Therapy Soft tissue mobilization    Manual therapy comments education on how to release trigger points in the abdomen    Soft tissue mobilization circular abdominal massage, using a scution cup to pull the fascial from the abdominal wall,                  PT Education - 04/04/20 1439    Education Details Access Code: [email or web address]; pelvic floor meditaiton; abdominal massage; how to use a suction cup to pull up on the abdominal wall to stretch the fascia; where to purchase suction cup and foam roller    Person(s) Educated Patient    Methods Explanation;Demonstration;Handout    Comprehension Verbalized understanding;Returned demonstration            PT Short Term Goals - 02/06/20 0847      PT SHORT TERM GOAL #3   Title lower abdominal pain decreased </= 4/10 due to improve mobility of pelvic floor tissue    Time 4    Period Weeks    Status Achieved    Target Date 01/16/20             PT Long Term Goals - 04/04/20 1443      PT LONG TERM GOAL #4   Title independent with advanced pelvic floor exericses    Time 12    Period Weeks    Status Achieved      PT LONG TERM GOAL #5   Title pain with daily activities decreased </= 2/10 and intermittent due to decreased pelvic floor muscle tension    Baseline 2-3/10 in the right lower abdominals    Time 12    Period Weeks    Status Achieved      PT LONG TERM GOAL #  6   Title after urinating and standing up no dribbling of urine due to improved pelvic floor coordination    Time 12    Period Weeks    Status Partially Met                 Plan - 04/04/20 1444    Clinical Impression Statement Patient  is here for one more visit to be discharged to HEP. Patient has difficulty with bowel movements. Patient will still dribble some after urinating. Patient has pain in right lower abdomen at level 2/10. Patient had MRI of pelvis showing fiborids. Patient is independent with her HEP and how to manage her pain. Patient is ready to be discharged due to meeting a mojority of goals and saving visits for when the end of the year if she has a flare-up or surgery.    Personal Factors and Comorbidities Behavior Pattern;Comorbidity 2;Past/Current Experience;Time since onset of injury/illness/exacerbation;Social Background    Comorbidities abnormal EKG Dec. 2019 & Jan. 2020:  Celiac disease:  depression & anxiety:  GERD:  meningioma with craniotomy 09/2018:  RMSF 2015    Examination-Activity Limitations Stand;Bend;Locomotion Level;Stairs;Squat;Transfers    Examination-Participation Restrictions Cleaning;Meal Prep;Community Activity;Shop    Stability/Clinical Decision Making Evolving/Moderate complexity    PT Frequency One time visit    PT Treatment/Interventions Neuromuscular re-education;Therapeutic activities;Therapeutic exercise;Patient/family education;Biofeedback;Cryotherapy;Electrical Stimulation;Ultrasound;Moist Heat;Manual techniques;Dry needling    PT Next Visit Plan 1 time visit to go over HEP today and today she is discharged to her HEP    Consulted and Agree with Plan of Care Patient           Patient will benefit from skilled therapeutic intervention in order to improve the following deficits and impairments:  Decreased coordination, Decreased range of motion, Increased fascial restricitons, Increased muscle spasms, Pain, Decreased strength  Visit Diagnosis: Cramp and spasm - Plan: PT plan of care cert/re-cert  Muscle weakness (generalized) - Plan: PT plan of care cert/re-cert     Problem List Patient Active Problem List   Diagnosis Date Noted  . Anxiety and depression 06/15/2017  .  Family history of brain aneurysm 04/19/2017  . Rhinitis, allergic 04/20/2014  . Generalized anxiety disorder 04/20/2014  . Celiac disease 12/15/2012  . Interstitial cystitis     Kevork Joyce 04/04/2020, 2:53 PM  [facility] [address], [place], [place], [address] Phone: [phone]   Fax:  [phone]  Name: EASTYN SKALLA MRN: [id number] Date of Birth: 04-Feb-1986  PHYSICAL THERAPY DISCHARGE SUMMARY  Visits from Start of Care: 11  Current functional level related to goals / functional outcomes: See above.    Remaining deficits: See above.    Education / Equipment: HEP Plan: Patient agrees to discharge.  Patient goals were partially met. Patient is being discharged due to being pleased with the current functional level.  Thank for the referral. Earlie Counts, PT 04/04/20 2:55 PM  ?????

## 2020-04-04 NOTE — Patient Instructions (Addendum)
Use the wand when you feel the spasms to release them for 5 minutes with pressure as you were pressing on a tomato  About Abdominal Massage  Abdominal massage, also called external colon massage, is a self-treatment circular massage technique that can reduce and eliminate gas and ease constipation. The colon naturally contracts in waves in a clockwise direction starting from inside the right hip, moving up toward the ribs, across the belly, and down inside the left hip.  When you perform circular abdominal massage, you help stimulate your colon's normal wave pattern of movement called peristalsis.  It is most beneficial when done after eating.  Positioning You can practice abdominal massage with oil while lying down, or in the shower with soap.  Some people find that it is just as effective to do the massage through clothing while sitting or standing.  How to Massage Start by placing your finger tips or knuckles on your right side, just inside your hip bone.  . Make small circular movements while you move upward toward your rib cage.   . Once you reach the bottom right side of your rib cage, take your circular movements across to the left side of the bottom of your rib cage.  . Next, move downward until you reach the inside of your left hip bone.  This is the path your feces travel in your colon. . Continue to perform your abdominal massage in this pattern for 10 minutes each day.     You can apply as much pressure as is comfortable in your massage.  Start gently and build pressure as you continue to practice.  Notice any areas of pain as you massage; areas of slight pain may be relieved as you massage, but if you have areas of significant or intense pain, consult with your healthcare provider.  Other Considerations . General physical activity including bending and stretching can have a beneficial massage-like effect on the colon.  Deep breathing can also stimulate the colon because breathing  deeply activates the same nervous system that supplies the colon.   . Abdominal massage should always be used in combination with a bowel-conscious diet that is high in the proper type of fiber for you, fluids (primarily water), and a regular exercise program.  When doing the tissue rolling and you feel a painful spot press on for 90 seconds to release the trigger point.   Use suction cup on abdomen to pull up the skin and release the fascia.    OPTP Pro-Roller Soft Density Foam Roller - Blue 36 Inch   SPEQUIX Silicone Cupping Therapy Set (3 Sizes,3 Pieces) Anti-Slip Silicone Massage Cups Chinese Silicone Cups for Anti Cellulite, Increase Collagen,Joint, Pain, Muscles, Fascia(White)     Guided Meditation for Pelvic Floor Relaxation  [employer]  Access Code: [email or web address] URL: [email or web address] Date: 04/04/2020 Prepared by: Earlie Counts  Program Notes tissue rolling of the abdomen wand on the pelvic floor muscles with pressure on a tomato, hold trigger point for 90 seconds, sweep the wand side to side   Exercises Bird Dog - 1 x daily - 7 x weekly - 2 sets - 10 reps Supine 90/90 Abdominal Bracing - 1 x daily - 7 x weekly - 2 sets - 10 reps Side Plank on Knees - 1 x daily - 7 x weekly - 1 sets - 10 reps Supine March - 1 x daily - 7 x weekly - 1 sets - 10 reps Supine Bridge with Mini Swiss Ball Between Knees -  1 x daily - 7 x weekly - 1 sets - 15 reps Supine Single Leg Lift - 1 x daily - 7 x weekly - 1 sets - 10 reps Supine Abdominal Wall Massage - 1 x daily - 7 x weekly - 1 reps - 1 sets - 5 min hold Supine Pelvic Floor Stretch - 1 x daily - 7 x weekly - 1 reps - 1 sets - 1-2 min hold Cat-Camel - 1 x daily - 7 x weekly - 10 reps - 1 sets Child's Pose Stretch - 1 x daily - 7 x weekly - 1 reps - 1 sets - 30 sec hold Seated Hip Flexor Stretch - 1 x daily - 7 x weekly - 3 sets - 10 reps Seated Hamstring Stretch - 1 x daily - 7 x weekly - 2 reps - 1 sets - 30 sec  hold Seated Piriformis Stretch with Trunk Bend - 1 x daily - 7 x weekly - 2 reps - 1 sets - 30 sec hold Standing Single Leg Stance with Counter Support - 3 reps - 1 sets - 15 hold Standing Tandem Balance with Counter Support - 3 reps - 1 sets - 15 hold Standing Gaze Stabilization with Head Rotation - 10 reps - 1 sets Romberg Stance Eyes Closed on Foam Pad - 3 reps - 1 sets - 30 hold Wide Stance with Eyes Closed and Head Rotation on Foam Pad - 10 reps - 1 sets Wide Tandem Stance on Foam Pad with Eyes Open - 10 reps - 3 sets [facility] [address] [place], [place] [address] Phone # [phone] Fax [phone]

## 2020-04-06 DIAGNOSIS — R5383 Other fatigue: Secondary | ICD-10-CM | POA: Diagnosis not present

## 2020-04-06 DIAGNOSIS — F419 Anxiety disorder, unspecified: Secondary | ICD-10-CM | POA: Diagnosis not present

## 2020-04-06 DIAGNOSIS — F329 Major depressive disorder, single episode, unspecified: Secondary | ICD-10-CM | POA: Diagnosis not present

## 2020-04-06 DIAGNOSIS — J0191 Acute recurrent sinusitis, unspecified: Secondary | ICD-10-CM | POA: Diagnosis not present

## 2020-04-09 ENCOUNTER — Ambulatory Visit: Payer: BC Managed Care – PPO | Admitting: Physical Therapy

## 2020-04-10 ENCOUNTER — Ambulatory Visit (INDEPENDENT_AMBULATORY_CARE_PROVIDER_SITE_OTHER): Payer: BC Managed Care – PPO | Admitting: Clinical

## 2020-04-10 DIAGNOSIS — F419 Anxiety disorder, unspecified: Secondary | ICD-10-CM | POA: Diagnosis not present

## 2020-04-13 DIAGNOSIS — K900 Celiac disease: Secondary | ICD-10-CM | POA: Diagnosis not present

## 2020-04-13 DIAGNOSIS — R110 Nausea: Secondary | ICD-10-CM | POA: Diagnosis not present

## 2020-04-13 DIAGNOSIS — R0789 Other chest pain: Secondary | ICD-10-CM | POA: Diagnosis not present

## 2020-04-13 DIAGNOSIS — R1030 Lower abdominal pain, unspecified: Secondary | ICD-10-CM | POA: Diagnosis not present

## 2020-04-16 ENCOUNTER — Ambulatory Visit: Payer: BC Managed Care – PPO | Admitting: Physical Therapy

## 2020-04-16 DIAGNOSIS — R0789 Other chest pain: Secondary | ICD-10-CM | POA: Diagnosis not present

## 2020-04-16 DIAGNOSIS — R1084 Generalized abdominal pain: Secondary | ICD-10-CM | POA: Diagnosis not present

## 2020-04-24 ENCOUNTER — Ambulatory Visit (INDEPENDENT_AMBULATORY_CARE_PROVIDER_SITE_OTHER): Payer: BC Managed Care – PPO | Admitting: Clinical

## 2020-04-24 DIAGNOSIS — F419 Anxiety disorder, unspecified: Secondary | ICD-10-CM | POA: Diagnosis not present

## 2020-04-25 ENCOUNTER — Encounter: Payer: BC Managed Care – PPO | Admitting: Physical Therapy

## 2020-04-26 DIAGNOSIS — G43009 Migraine without aura, not intractable, without status migrainosus: Secondary | ICD-10-CM | POA: Diagnosis not present

## 2020-04-27 DIAGNOSIS — N809 Endometriosis, unspecified: Secondary | ICD-10-CM | POA: Diagnosis not present

## 2020-04-27 DIAGNOSIS — Z3169 Encounter for other general counseling and advice on procreation: Secondary | ICD-10-CM | POA: Diagnosis not present

## 2020-05-03 DIAGNOSIS — K1379 Other lesions of oral mucosa: Secondary | ICD-10-CM | POA: Diagnosis not present

## 2020-05-03 DIAGNOSIS — R519 Headache, unspecified: Secondary | ICD-10-CM | POA: Diagnosis not present

## 2020-05-03 DIAGNOSIS — K0389 Other specified diseases of hard tissues of teeth: Secondary | ICD-10-CM | POA: Diagnosis not present

## 2020-05-08 ENCOUNTER — Ambulatory Visit: Payer: BC Managed Care – PPO | Admitting: Clinical

## 2020-05-08 DIAGNOSIS — G8929 Other chronic pain: Secondary | ICD-10-CM | POA: Diagnosis not present

## 2020-05-08 DIAGNOSIS — R140 Abdominal distension (gaseous): Secondary | ICD-10-CM | POA: Diagnosis not present

## 2020-05-08 DIAGNOSIS — K900 Celiac disease: Secondary | ICD-10-CM | POA: Diagnosis not present

## 2020-05-08 DIAGNOSIS — R1084 Generalized abdominal pain: Secondary | ICD-10-CM | POA: Diagnosis not present

## 2020-05-09 DIAGNOSIS — R0789 Other chest pain: Secondary | ICD-10-CM | POA: Diagnosis not present

## 2020-05-09 DIAGNOSIS — F419 Anxiety disorder, unspecified: Secondary | ICD-10-CM | POA: Diagnosis not present

## 2020-05-09 DIAGNOSIS — L03011 Cellulitis of right finger: Secondary | ICD-10-CM | POA: Diagnosis not present

## 2020-05-09 DIAGNOSIS — R42 Dizziness and giddiness: Secondary | ICD-10-CM | POA: Diagnosis not present

## 2020-05-09 DIAGNOSIS — N809 Endometriosis, unspecified: Secondary | ICD-10-CM | POA: Diagnosis not present

## 2020-05-11 DIAGNOSIS — G4452 New daily persistent headache (NDPH): Secondary | ICD-10-CM | POA: Diagnosis not present

## 2020-05-11 DIAGNOSIS — F419 Anxiety disorder, unspecified: Secondary | ICD-10-CM | POA: Diagnosis not present

## 2020-05-11 DIAGNOSIS — L03011 Cellulitis of right finger: Secondary | ICD-10-CM | POA: Diagnosis not present

## 2020-05-11 DIAGNOSIS — N809 Endometriosis, unspecified: Secondary | ICD-10-CM | POA: Diagnosis not present

## 2020-05-12 DIAGNOSIS — L03011 Cellulitis of right finger: Secondary | ICD-10-CM | POA: Diagnosis not present

## 2020-05-21 DIAGNOSIS — L03011 Cellulitis of right finger: Secondary | ICD-10-CM | POA: Diagnosis not present

## 2020-05-22 ENCOUNTER — Ambulatory Visit (INDEPENDENT_AMBULATORY_CARE_PROVIDER_SITE_OTHER): Payer: BC Managed Care – PPO | Admitting: Clinical

## 2020-05-22 DIAGNOSIS — F419 Anxiety disorder, unspecified: Secondary | ICD-10-CM | POA: Diagnosis not present

## 2020-05-22 DIAGNOSIS — G4700 Insomnia, unspecified: Secondary | ICD-10-CM | POA: Diagnosis not present

## 2020-05-22 DIAGNOSIS — R0789 Other chest pain: Secondary | ICD-10-CM | POA: Diagnosis not present

## 2020-05-22 DIAGNOSIS — R42 Dizziness and giddiness: Secondary | ICD-10-CM | POA: Diagnosis not present

## 2020-05-22 DIAGNOSIS — N809 Endometriosis, unspecified: Secondary | ICD-10-CM | POA: Diagnosis not present

## 2020-05-22 DIAGNOSIS — F329 Major depressive disorder, single episode, unspecified: Secondary | ICD-10-CM | POA: Diagnosis not present

## 2020-05-23 DIAGNOSIS — F419 Anxiety disorder, unspecified: Secondary | ICD-10-CM | POA: Diagnosis not present

## 2020-05-23 DIAGNOSIS — R42 Dizziness and giddiness: Secondary | ICD-10-CM | POA: Diagnosis not present

## 2020-05-23 DIAGNOSIS — F329 Major depressive disorder, single episode, unspecified: Secondary | ICD-10-CM | POA: Diagnosis not present

## 2020-05-23 DIAGNOSIS — G4700 Insomnia, unspecified: Secondary | ICD-10-CM | POA: Diagnosis not present

## 2020-05-23 DIAGNOSIS — N809 Endometriosis, unspecified: Secondary | ICD-10-CM | POA: Diagnosis not present

## 2020-05-23 DIAGNOSIS — R0789 Other chest pain: Secondary | ICD-10-CM | POA: Diagnosis not present

## 2020-05-25 DIAGNOSIS — K900 Celiac disease: Secondary | ICD-10-CM | POA: Diagnosis not present

## 2020-05-25 DIAGNOSIS — F419 Anxiety disorder, unspecified: Secondary | ICD-10-CM | POA: Diagnosis not present

## 2020-05-25 DIAGNOSIS — L309 Dermatitis, unspecified: Secondary | ICD-10-CM | POA: Diagnosis not present

## 2020-05-25 DIAGNOSIS — N809 Endometriosis, unspecified: Secondary | ICD-10-CM | POA: Diagnosis not present

## 2020-06-01 DIAGNOSIS — M549 Dorsalgia, unspecified: Secondary | ICD-10-CM | POA: Diagnosis not present

## 2020-06-05 ENCOUNTER — Ambulatory Visit (INDEPENDENT_AMBULATORY_CARE_PROVIDER_SITE_OTHER): Payer: BC Managed Care – PPO | Admitting: Clinical

## 2020-06-05 DIAGNOSIS — F419 Anxiety disorder, unspecified: Secondary | ICD-10-CM | POA: Diagnosis not present

## 2020-06-06 DIAGNOSIS — G4700 Insomnia, unspecified: Secondary | ICD-10-CM | POA: Diagnosis not present

## 2020-06-06 DIAGNOSIS — G4489 Other headache syndrome: Secondary | ICD-10-CM | POA: Diagnosis not present

## 2020-06-06 DIAGNOSIS — F419 Anxiety disorder, unspecified: Secondary | ICD-10-CM | POA: Diagnosis not present

## 2020-06-11 DIAGNOSIS — R0789 Other chest pain: Secondary | ICD-10-CM | POA: Diagnosis not present

## 2020-06-11 DIAGNOSIS — R110 Nausea: Secondary | ICD-10-CM | POA: Diagnosis not present

## 2020-06-11 DIAGNOSIS — R198 Other specified symptoms and signs involving the digestive system and abdomen: Secondary | ICD-10-CM | POA: Diagnosis not present

## 2020-06-11 DIAGNOSIS — K900 Celiac disease: Secondary | ICD-10-CM | POA: Diagnosis not present

## 2020-06-14 DIAGNOSIS — K0889 Other specified disorders of teeth and supporting structures: Secondary | ICD-10-CM | POA: Diagnosis not present

## 2020-06-15 DIAGNOSIS — T781XXA Other adverse food reactions, not elsewhere classified, initial encounter: Secondary | ICD-10-CM | POA: Diagnosis not present

## 2020-06-15 DIAGNOSIS — G4452 New daily persistent headache (NDPH): Secondary | ICD-10-CM | POA: Diagnosis not present

## 2020-06-15 DIAGNOSIS — R0789 Other chest pain: Secondary | ICD-10-CM | POA: Diagnosis not present

## 2020-06-19 ENCOUNTER — Ambulatory Visit (INDEPENDENT_AMBULATORY_CARE_PROVIDER_SITE_OTHER): Payer: BC Managed Care – PPO | Admitting: Clinical

## 2020-06-19 DIAGNOSIS — F419 Anxiety disorder, unspecified: Secondary | ICD-10-CM | POA: Diagnosis not present

## 2020-06-29 DIAGNOSIS — R102 Pelvic and perineal pain: Secondary | ICD-10-CM | POA: Diagnosis not present

## 2020-06-29 DIAGNOSIS — F419 Anxiety disorder, unspecified: Secondary | ICD-10-CM | POA: Diagnosis not present

## 2020-06-29 DIAGNOSIS — G8929 Other chronic pain: Secondary | ICD-10-CM | POA: Diagnosis not present

## 2020-06-29 DIAGNOSIS — T781XXA Other adverse food reactions, not elsewhere classified, initial encounter: Secondary | ICD-10-CM | POA: Diagnosis not present

## 2020-07-03 ENCOUNTER — Ambulatory Visit (INDEPENDENT_AMBULATORY_CARE_PROVIDER_SITE_OTHER): Payer: BC Managed Care – PPO | Admitting: Clinical

## 2020-07-03 DIAGNOSIS — F419 Anxiety disorder, unspecified: Secondary | ICD-10-CM

## 2020-07-06 DIAGNOSIS — G8929 Other chronic pain: Secondary | ICD-10-CM | POA: Diagnosis not present

## 2020-07-06 DIAGNOSIS — R350 Frequency of micturition: Secondary | ICD-10-CM | POA: Diagnosis not present

## 2020-07-06 DIAGNOSIS — Z23 Encounter for immunization: Secondary | ICD-10-CM | POA: Diagnosis not present

## 2020-07-06 DIAGNOSIS — R102 Pelvic and perineal pain: Secondary | ICD-10-CM | POA: Diagnosis not present

## 2020-07-06 DIAGNOSIS — F411 Generalized anxiety disorder: Secondary | ICD-10-CM | POA: Diagnosis not present

## 2020-07-06 DIAGNOSIS — B373 Candidiasis of vulva and vagina: Secondary | ICD-10-CM | POA: Diagnosis not present

## 2020-07-06 DIAGNOSIS — R309 Painful micturition, unspecified: Secondary | ICD-10-CM | POA: Diagnosis not present

## 2020-07-10 DIAGNOSIS — N760 Acute vaginitis: Secondary | ICD-10-CM | POA: Diagnosis not present

## 2020-07-10 DIAGNOSIS — N898 Other specified noninflammatory disorders of vagina: Secondary | ICD-10-CM | POA: Diagnosis not present

## 2020-07-10 DIAGNOSIS — N761 Subacute and chronic vaginitis: Secondary | ICD-10-CM | POA: Diagnosis not present

## 2020-07-10 DIAGNOSIS — R309 Painful micturition, unspecified: Secondary | ICD-10-CM | POA: Diagnosis not present

## 2020-07-10 DIAGNOSIS — N842 Polyp of vagina: Secondary | ICD-10-CM | POA: Diagnosis not present

## 2020-07-17 ENCOUNTER — Ambulatory Visit (INDEPENDENT_AMBULATORY_CARE_PROVIDER_SITE_OTHER): Payer: BC Managed Care – PPO | Admitting: Clinical

## 2020-07-17 DIAGNOSIS — F419 Anxiety disorder, unspecified: Secondary | ICD-10-CM

## 2020-07-27 DIAGNOSIS — F411 Generalized anxiety disorder: Secondary | ICD-10-CM | POA: Diagnosis not present

## 2020-07-27 DIAGNOSIS — K5901 Slow transit constipation: Secondary | ICD-10-CM | POA: Diagnosis not present

## 2020-07-27 DIAGNOSIS — G4452 New daily persistent headache (NDPH): Secondary | ICD-10-CM | POA: Diagnosis not present

## 2020-07-31 ENCOUNTER — Ambulatory Visit (INDEPENDENT_AMBULATORY_CARE_PROVIDER_SITE_OTHER): Payer: BC Managed Care – PPO | Admitting: Clinical

## 2020-07-31 DIAGNOSIS — F419 Anxiety disorder, unspecified: Secondary | ICD-10-CM

## 2020-08-07 DIAGNOSIS — H5211 Myopia, right eye: Secondary | ICD-10-CM | POA: Diagnosis not present

## 2020-08-08 DIAGNOSIS — Z01419 Encounter for gynecological examination (general) (routine) without abnormal findings: Secondary | ICD-10-CM | POA: Diagnosis not present

## 2020-08-08 DIAGNOSIS — Z6822 Body mass index (BMI) 22.0-22.9, adult: Secondary | ICD-10-CM | POA: Diagnosis not present

## 2020-08-10 DIAGNOSIS — H43823 Vitreomacular adhesion, bilateral: Secondary | ICD-10-CM | POA: Diagnosis not present

## 2020-08-14 ENCOUNTER — Ambulatory Visit: Payer: BC Managed Care – PPO | Admitting: Clinical

## 2020-08-15 DIAGNOSIS — K900 Celiac disease: Secondary | ICD-10-CM | POA: Diagnosis not present

## 2020-08-15 DIAGNOSIS — R1084 Generalized abdominal pain: Secondary | ICD-10-CM | POA: Diagnosis not present

## 2020-08-15 DIAGNOSIS — G8929 Other chronic pain: Secondary | ICD-10-CM | POA: Diagnosis not present

## 2020-08-17 DIAGNOSIS — G8929 Other chronic pain: Secondary | ICD-10-CM | POA: Diagnosis not present

## 2020-08-17 DIAGNOSIS — K900 Celiac disease: Secondary | ICD-10-CM | POA: Diagnosis not present

## 2020-08-17 DIAGNOSIS — R790 Abnormal level of blood mineral: Secondary | ICD-10-CM | POA: Diagnosis not present

## 2020-08-17 DIAGNOSIS — R102 Pelvic and perineal pain: Secondary | ICD-10-CM | POA: Diagnosis not present

## 2020-08-21 DIAGNOSIS — D329 Benign neoplasm of meninges, unspecified: Secondary | ICD-10-CM | POA: Diagnosis not present

## 2020-08-21 DIAGNOSIS — R519 Headache, unspecified: Secondary | ICD-10-CM | POA: Diagnosis not present

## 2020-08-24 DIAGNOSIS — R0602 Shortness of breath: Secondary | ICD-10-CM | POA: Diagnosis not present

## 2020-08-24 DIAGNOSIS — R0789 Other chest pain: Secondary | ICD-10-CM | POA: Diagnosis not present

## 2020-08-24 DIAGNOSIS — Z20822 Contact with and (suspected) exposure to covid-19: Secondary | ICD-10-CM | POA: Diagnosis not present

## 2020-08-27 DIAGNOSIS — L739 Follicular disorder, unspecified: Secondary | ICD-10-CM | POA: Diagnosis not present

## 2020-08-28 ENCOUNTER — Ambulatory Visit (INDEPENDENT_AMBULATORY_CARE_PROVIDER_SITE_OTHER): Payer: BC Managed Care – PPO | Admitting: Clinical

## 2020-08-28 DIAGNOSIS — F419 Anxiety disorder, unspecified: Secondary | ICD-10-CM

## 2020-08-31 DIAGNOSIS — S0081XA Abrasion of other part of head, initial encounter: Secondary | ICD-10-CM | POA: Diagnosis not present

## 2020-08-31 DIAGNOSIS — W5503XA Scratched by cat, initial encounter: Secondary | ICD-10-CM | POA: Diagnosis not present

## 2020-08-31 DIAGNOSIS — Z0000 Encounter for general adult medical examination without abnormal findings: Secondary | ICD-10-CM | POA: Diagnosis not present

## 2020-08-31 DIAGNOSIS — E611 Iron deficiency: Secondary | ICD-10-CM | POA: Diagnosis not present

## 2020-09-06 DIAGNOSIS — D225 Melanocytic nevi of trunk: Secondary | ICD-10-CM | POA: Diagnosis not present

## 2020-09-06 DIAGNOSIS — L821 Other seborrheic keratosis: Secondary | ICD-10-CM | POA: Diagnosis not present

## 2020-09-06 DIAGNOSIS — L814 Other melanin hyperpigmentation: Secondary | ICD-10-CM | POA: Diagnosis not present

## 2020-09-06 DIAGNOSIS — L578 Other skin changes due to chronic exposure to nonionizing radiation: Secondary | ICD-10-CM | POA: Diagnosis not present

## 2020-09-07 DIAGNOSIS — G4452 New daily persistent headache (NDPH): Secondary | ICD-10-CM | POA: Diagnosis not present

## 2020-09-07 DIAGNOSIS — R102 Pelvic and perineal pain: Secondary | ICD-10-CM | POA: Diagnosis not present

## 2020-09-07 DIAGNOSIS — G8929 Other chronic pain: Secondary | ICD-10-CM | POA: Diagnosis not present

## 2020-09-07 DIAGNOSIS — F411 Generalized anxiety disorder: Secondary | ICD-10-CM | POA: Diagnosis not present

## 2020-09-09 DIAGNOSIS — R519 Headache, unspecified: Secondary | ICD-10-CM | POA: Diagnosis not present

## 2020-09-09 DIAGNOSIS — D329 Benign neoplasm of meninges, unspecified: Secondary | ICD-10-CM | POA: Diagnosis not present

## 2020-09-11 ENCOUNTER — Ambulatory Visit: Payer: BC Managed Care – PPO | Admitting: Clinical

## 2020-09-11 DIAGNOSIS — D329 Benign neoplasm of meninges, unspecified: Secondary | ICD-10-CM | POA: Diagnosis not present

## 2020-09-12 DIAGNOSIS — H5789 Other specified disorders of eye and adnexa: Secondary | ICD-10-CM | POA: Diagnosis not present

## 2020-09-19 DIAGNOSIS — Z20822 Contact with and (suspected) exposure to covid-19: Secondary | ICD-10-CM | POA: Diagnosis not present

## 2020-09-19 DIAGNOSIS — J069 Acute upper respiratory infection, unspecified: Secondary | ICD-10-CM | POA: Diagnosis not present

## 2020-09-25 ENCOUNTER — Ambulatory Visit (INDEPENDENT_AMBULATORY_CARE_PROVIDER_SITE_OTHER): Payer: BC Managed Care – PPO | Admitting: Clinical

## 2020-09-25 DIAGNOSIS — F419 Anxiety disorder, unspecified: Secondary | ICD-10-CM | POA: Diagnosis not present

## 2020-09-26 DIAGNOSIS — R0789 Other chest pain: Secondary | ICD-10-CM | POA: Diagnosis not present

## 2020-10-03 DIAGNOSIS — M549 Dorsalgia, unspecified: Secondary | ICD-10-CM | POA: Diagnosis not present

## 2020-10-03 DIAGNOSIS — R293 Abnormal posture: Secondary | ICD-10-CM | POA: Diagnosis not present

## 2020-10-03 DIAGNOSIS — R29898 Other symptoms and signs involving the musculoskeletal system: Secondary | ICD-10-CM | POA: Diagnosis not present

## 2020-10-03 DIAGNOSIS — M6281 Muscle weakness (generalized): Secondary | ICD-10-CM | POA: Diagnosis not present

## 2020-10-04 ENCOUNTER — Ambulatory Visit (INDEPENDENT_AMBULATORY_CARE_PROVIDER_SITE_OTHER): Payer: BC Managed Care – PPO | Admitting: Clinical

## 2020-10-04 DIAGNOSIS — F419 Anxiety disorder, unspecified: Secondary | ICD-10-CM

## 2020-10-09 ENCOUNTER — Ambulatory Visit: Payer: BC Managed Care – PPO | Admitting: Clinical

## 2020-10-11 ENCOUNTER — Encounter: Payer: Self-pay | Admitting: Plastic Surgery

## 2020-10-11 ENCOUNTER — Other Ambulatory Visit: Payer: Self-pay

## 2020-10-11 ENCOUNTER — Ambulatory Visit (INDEPENDENT_AMBULATORY_CARE_PROVIDER_SITE_OTHER): Payer: BC Managed Care – PPO | Admitting: Plastic Surgery

## 2020-10-11 VITALS — BP 106/57 | HR 68 | Temp 98.6°F | Ht 65.0 in | Wt 129.6 lb

## 2020-10-11 DIAGNOSIS — L989 Disorder of the skin and subcutaneous tissue, unspecified: Secondary | ICD-10-CM | POA: Diagnosis not present

## 2020-10-11 NOTE — Progress Notes (Signed)
Referring Provider Nickola Major, MD [address] [place],  [place] [address]   CC:  Chief Complaint  Patient presents with  . Advice Only      Mary Wyatt is an 34 y.o. female.  HPI: Patient presents with concerns regarding a lesion on the right nasolabial fold.  Is been present for a number of years but recently has been getting a bit bigger.  It is prominent and will catch on things.  It also itches.  She is interested in having it removed.  Allergies  Allergen Reactions  . Diflucan [Fluconazole] Hives  . Gluten Meal Other (See Comments)    Celiac     Outpatient Encounter Medications as of 10/11/2020  Medication Sig  . betamethasone dipropionate (DIPROLENE) 0.05 % cream Apply topically as needed.  . desonide (DESOWEN) 0.05 % cream Apply topically as needed.  . fexofenadine (ALLEGRA) 180 MG tablet Take 180 mg by mouth daily.  . Fluticasone Propionate (FLONASE ALLERGY RELIEF NA) Place into the nose 2 (two) times daily.  . montelukast (SINGULAIR) 10 MG tablet Take 10 mg by mouth daily.  . Multiple Vitamin (MULTIVITAMIN) tablet Take 1 tablet by mouth daily. Women's MVI- Take one daily  . triamcinolone cream (KENALOG) 0.1 % Apply 1 application topically 2 (two) times daily. (Patient taking differently: Apply 1 application topically as needed.)  . [DISCONTINUED] ALPRAZolam (XANAX) 0.25 MG tablet Take 0.25 mg by mouth as needed for anxiety.  . [DISCONTINUED] Azelaic Acid 15 % cream Apply 1 Dose topically as needed. After skin is thoroughly washed and patted dry, gently but thoroughly massage a thin film of azelaic acid cream into the affected area prn  . [DISCONTINUED] B Complex-C (SUPER B COMPLEX PO) Take by mouth daily.  . [DISCONTINUED] busPIRone (BUSPAR) 5 MG tablet Take 5 mg by mouth 2 (two) times daily.  . [DISCONTINUED] cyclobenzaprine (FLEXERIL) 5 MG tablet Take 5 mg by mouth as needed for muscle spasms.  . [DISCONTINUED] fexofenadine-pseudoephedrine  (ALLEGRA-D 24) 180-240 MG 24 hr tablet Take 1 tablet by mouth daily.  . [DISCONTINUED] JUNEL FE 24 1-20 MG-MCG(24) tablet Take 1 tablet by mouth daily.  . [DISCONTINUED] Levonorgestrel-Ethinyl Estradiol (AMETHIA) 0.15-0.03 &0.01 MG tablet Take 1 tablet by mouth daily.  . [DISCONTINUED] loratadine (CLARITIN) 10 MG tablet daily.   . [DISCONTINUED] omeprazole (PRILOSEC) 40 MG capsule Take 40 mg by mouth 2 (two) times daily before a meal.   . [DISCONTINUED] pseudoephedrine (SUDAFED) 30 MG tablet Take 30 mg by mouth as needed for congestion.   No facility-administered encounter medications on file as of 10/11/2020.     Past Medical History:  Diagnosis Date  . Abnormal EKG    Dec 2019 and Jan 2020/ saw cardiologist  . Allergy   . Anemia   . Anxiety   . Celiac disease   . Depression   . Genital warts   . GERD (gastroesophageal reflux disease)   . Interstitial cystitis   . Meningioma ([place])   . Post-operative nausea and vomiting   . RMSF [facility] spotted fever) 2015  . Ulcer     Past Surgical History:  Procedure Laterality Date  . COLONOSCOPY  2013  . CRANIOTOMY  09/2018  . laproscopic surgery     2013 and 2019  . OVARIAN CYST REMOVAL    . TONSILLECTOMY    . UPPER GASTROINTESTINAL ENDOSCOPY  2013    Family History  Problem Relation Age of Onset  . GI problems Sister   .  Asthma Sister   . Anxiety disorder Sister   . Heart disease Maternal Grandmother   . Anxiety disorder Mother   . Depression Mother   . Anuerysm Mother   . Alcohol abuse Maternal Grandfather   . Anxiety disorder Sister   . Cancer Maternal Aunt        breast  . OCD Maternal Aunt   . Colon cancer Neg Hx   . Pancreatic cancer Neg Hx        Not to patient's knowlwdge  . Rectal cancer Neg Hx        Not to patient's knowledge    Social History   Social History Narrative      Marital status:  Single; parents in [place]      Children: none      Employment: [employer] works indoors        Tobacco: none      Alcohol: socially; twice weekly      Drugs: none      Exercise: sporadic; walking sometimes         Daily caffeine      Review of Systems General: Denies fevers, chills, weight loss CV: Denies chest pain, shortness of breath, palpitations  Physical Exam Vitals with BMI 10/11/2020 12/15/2019 06/09/2019  Height 5' 5"  - -  Weight 129 lbs 10 oz - -  BMI 19.62 - -  Systolic 229 798 96  Diastolic 57 69 58  Pulse 68 82 72  Some encounter information is confidential and restricted. Go to Review Flowsheets activity to see all data.    General:  No acute distress,  Alert and oriented, Non-Toxic, Normal speech and affect Examination shows a 7 to 8 mm diameter fleshy papule in the right nasolabial fold as it approaches the nostril.  There is no surrounding skin changes.  There is well-defined borders.  Assessment/Plan Patient presents with a lesion in the right nasolabial fold.  It symptomatic and changing and we discussed excision.  We discussed the risks include bleeding, infection, damage to surrounding structures and need for additional procedures.  All of her questions were answered and we will plan to proceed with excision under local in the office.  Cindra Presume 10/11/2020, 9:48 AM

## 2020-10-14 IMAGING — CT CT MAXILLOFACIAL W/O CM
1 series · 15 of 30 positions shown, 19 images · non-contrast
Comparison: Brain MRI 11/23/2007

CLINICAL DATA: Chronic sinusitis with facial pain, pressure, and
congestion.

EXAM:
CT MAXILLOFACIAL WITHOUT CONTRAST
TECHNIQUE: Multidetector CT images of the paranasal sinuses were obtained using
the standard protocol without intravenous contrast.

[Series 5: soft tissue · axial · 0.45mm/px · z∈[+1205,+1365]mm · 15 of 172 slices shown, 19 images]
[im 6/172  brain]
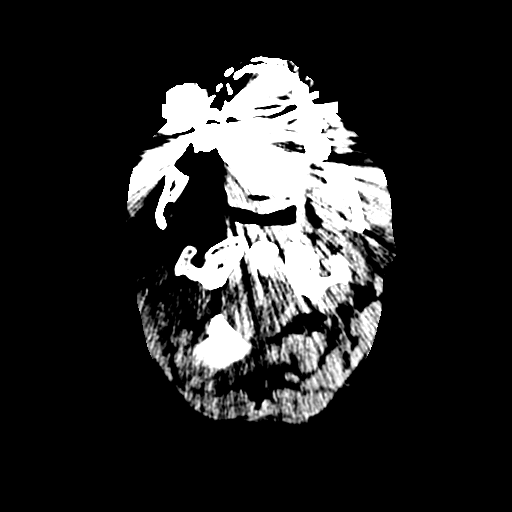
[im 6/172  bone]
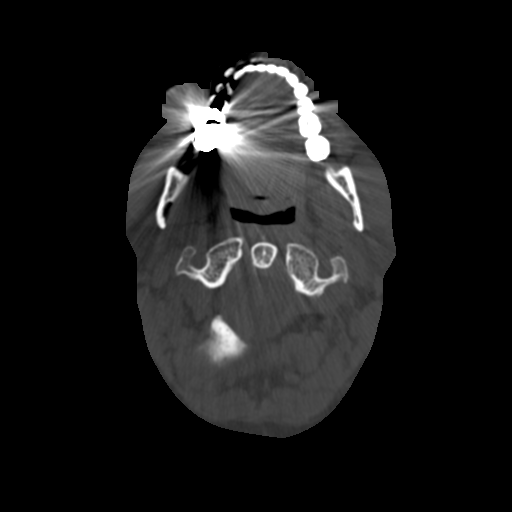
[im 18/172  bone]
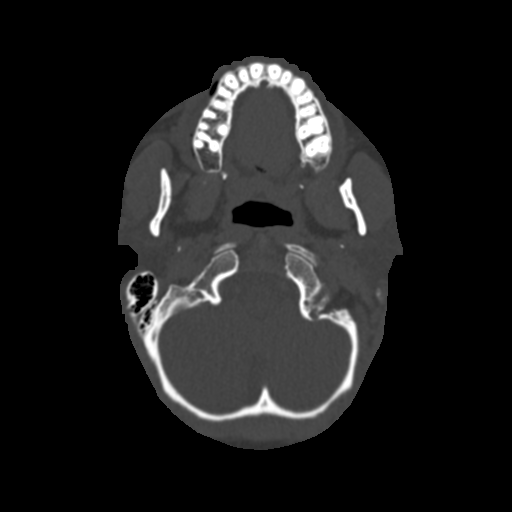
[im 30/172  bone]
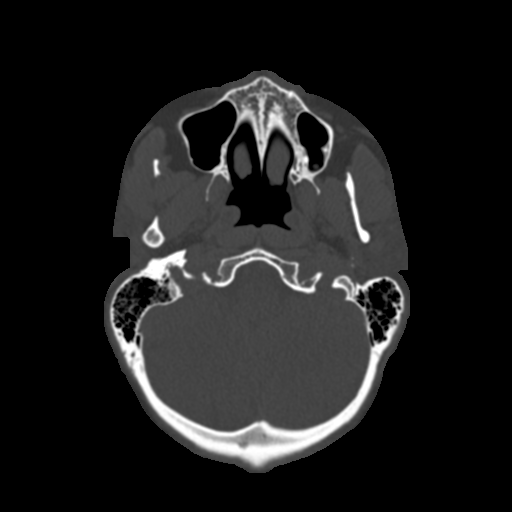
[im 42/172  bone]
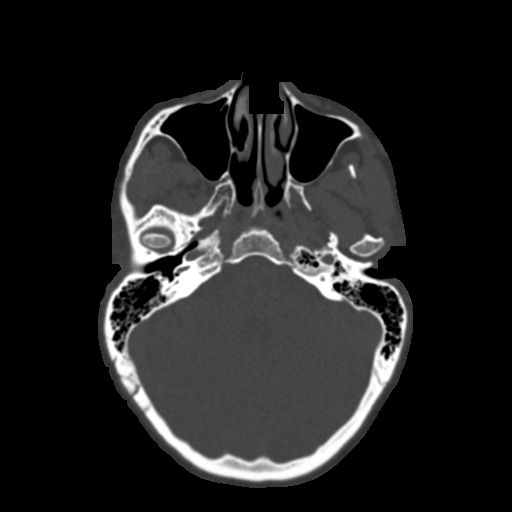
[im 54/172  brain]
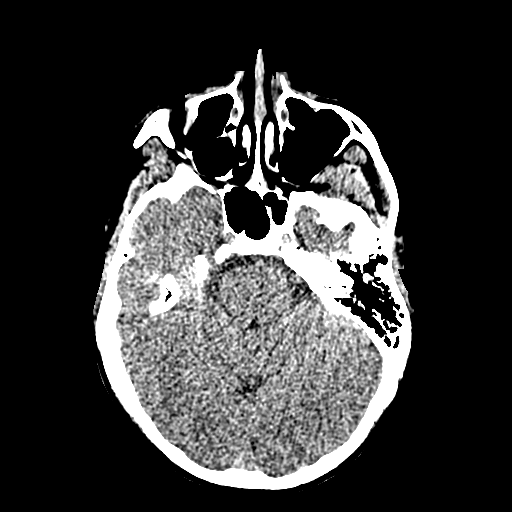
[im 54/172  bone]
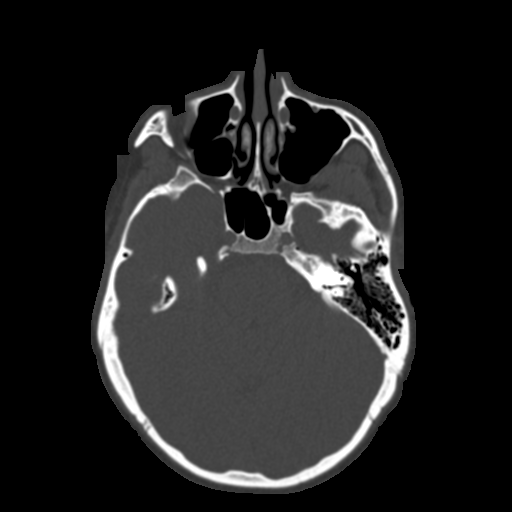
[im 65/172  bone]
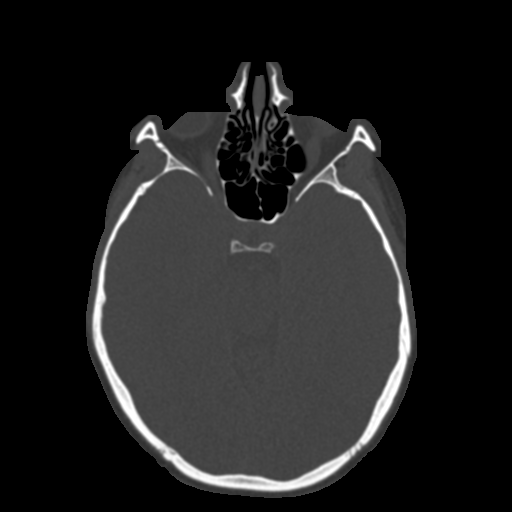
[im 77/172  bone]
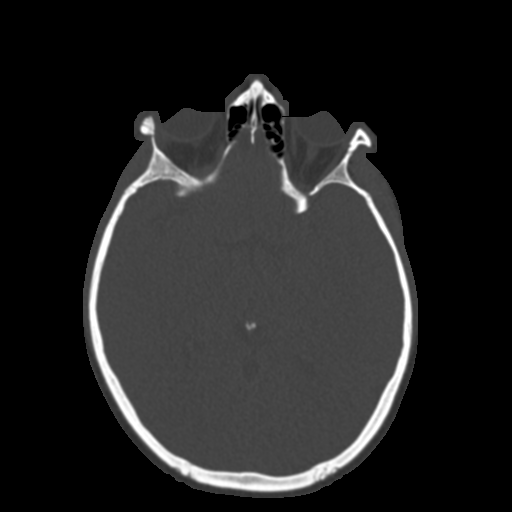
[im 89/172  bone]
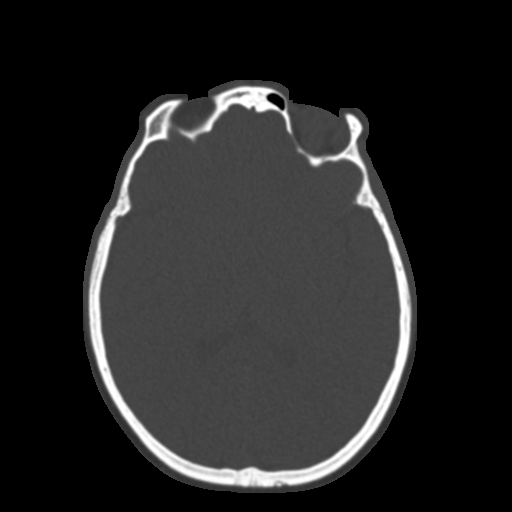
[im 95/172  brain]
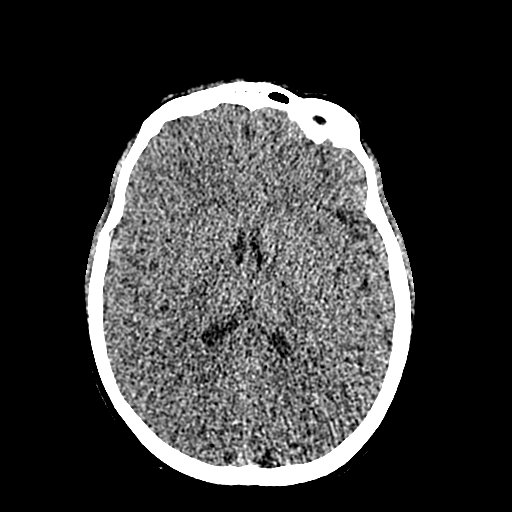
[im 95/172  bone]
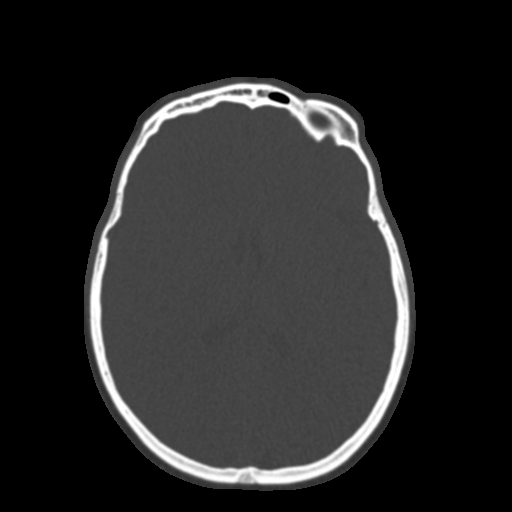
[im 107/172  bone]
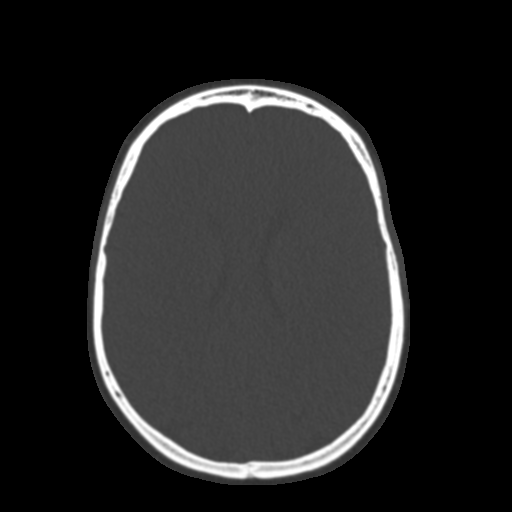
[im 118/172  bone]
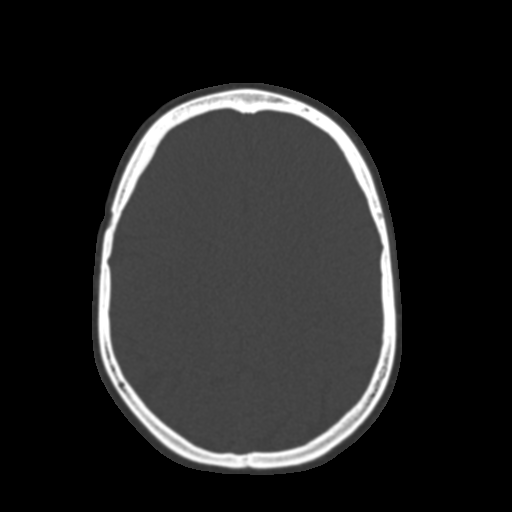
[im 130/172  bone]
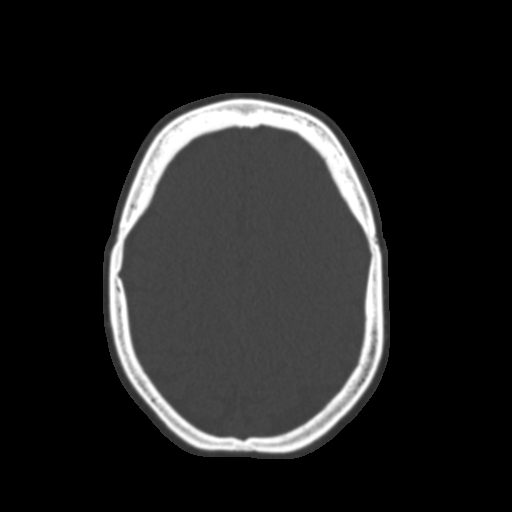
[im 142/172  brain]
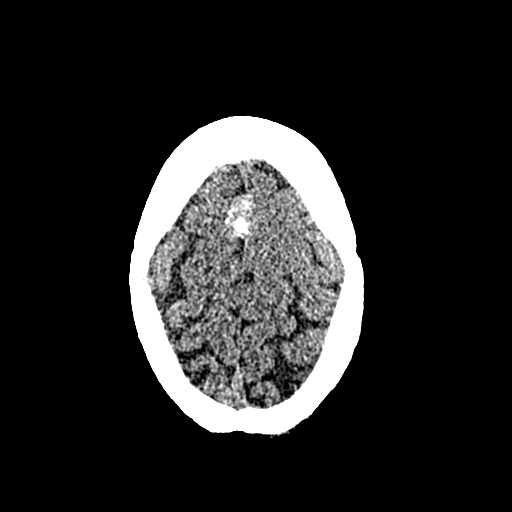
[im 142/172  bone]
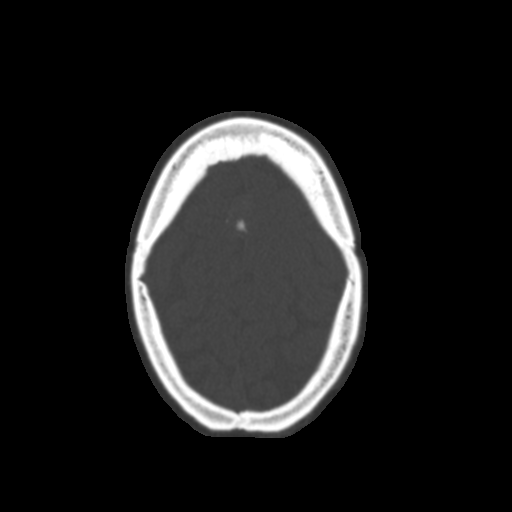
[im 154/172  bone]
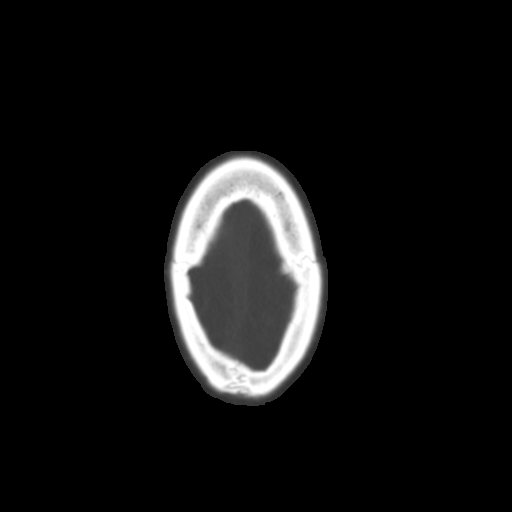
[im 166/172  bone]
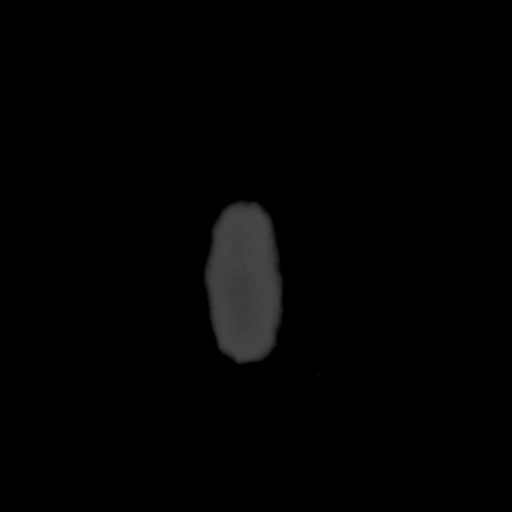

[15 of 30 positions shown; findings below may reference images not displayed]

FINDINGS: Paranasal sinuses:

Frontal: Clear. Small frontal sinuses with the right being
particularly hypoplastic. Patent frontal sinus drainage pathways.

Ethmoid: Normally aerated.

Maxillary: Minimal mucosal thickening and tiny mucous retention
cysts bilaterally. No fluid.

Sphenoid: Normally aerated. Patent sphenoethmoidal recesses.

Right ostiomeatal unit: Patent.

Left ostiomeatal unit: Patent.

Nasal passages: Patent. Mild left inferior nasal turbinate
hypertrophy. Intact nasal septum is midline.

Anatomy: No pneumatization superior to anterior ethmoid notches.
Symmetric and intact olfactory grooves and fovea ethmoidalis, [place]
I. Sellar sphenoid pneumatization pattern. No dehiscence of carotid
or optic canals. No onodi cell.

Other: Clear mastoid air cells and tympanic cavities. Partially
calcified extra-axial mass along the falx in the high right frontal
region potentially invading the superior sagittal sinus.
IMPRESSION: 1. No evidence of acute sinusitis. Tiny mucous retention cysts in
the maxillary sinuses.
2. Partially calcified extra-axial right frontal parafalcine mass,
likely a meningioma and potentially invading the superior sagittal
sinus. Brain MRI without and with contrast is recommended for
further evaluation.

## 2020-10-23 ENCOUNTER — Ambulatory Visit (INDEPENDENT_AMBULATORY_CARE_PROVIDER_SITE_OTHER): Payer: BC Managed Care – PPO | Admitting: Clinical

## 2020-10-23 DIAGNOSIS — F419 Anxiety disorder, unspecified: Secondary | ICD-10-CM | POA: Diagnosis not present

## 2020-11-01 ENCOUNTER — Ambulatory Visit (INDEPENDENT_AMBULATORY_CARE_PROVIDER_SITE_OTHER): Payer: BC Managed Care – PPO | Admitting: Clinical

## 2020-11-01 DIAGNOSIS — F419 Anxiety disorder, unspecified: Secondary | ICD-10-CM

## 2020-11-06 ENCOUNTER — Ambulatory Visit: Payer: BC Managed Care – PPO | Admitting: Clinical

## 2020-11-20 ENCOUNTER — Ambulatory Visit (INDEPENDENT_AMBULATORY_CARE_PROVIDER_SITE_OTHER): Payer: BC Managed Care – PPO | Admitting: Clinical

## 2020-11-20 DIAGNOSIS — F419 Anxiety disorder, unspecified: Secondary | ICD-10-CM

## 2020-11-21 ENCOUNTER — Encounter: Payer: Self-pay | Admitting: Plastic Surgery

## 2020-11-21 ENCOUNTER — Ambulatory Visit: Payer: BC Managed Care – PPO | Admitting: Plastic Surgery

## 2020-11-21 ENCOUNTER — Ambulatory Visit (INDEPENDENT_AMBULATORY_CARE_PROVIDER_SITE_OTHER): Payer: BC Managed Care – PPO | Admitting: Plastic Surgery

## 2020-11-21 ENCOUNTER — Other Ambulatory Visit (HOSPITAL_COMMUNITY)
Admission: RE | Admit: 2020-11-21 | Discharge: 2020-11-21 | Disposition: A | Discharge status: Home / Self Care | Payer: BC Managed Care – PPO | Source: Ambulatory Visit | Attending: Plastic Surgery | Admitting: Plastic Surgery

## 2020-11-21 ENCOUNTER — Other Ambulatory Visit: Payer: Self-pay

## 2020-11-21 VITALS — BP 110/67 | HR 91

## 2020-11-21 DIAGNOSIS — L989 Disorder of the skin and subcutaneous tissue, unspecified: Secondary | ICD-10-CM

## 2020-11-21 NOTE — Progress Notes (Signed)
Operative Note   DATE OF OPERATION: 11/21/2020  LOCATION:    SURGICAL DEPARTMENT: Plastic Surgery  PREOPERATIVE DIAGNOSES:  Right cheek skin lesion  POSTOPERATIVE DIAGNOSES:  same  PROCEDURE:  1. Excision of right cheek lesion measuring 2 cm 2. Complex closure measuring 2 cm  SURGEON: Talmadge Coventry, MD  ANESTHESIA:  Local  COMPLICATIONS: None.   INDICATIONS FOR PROCEDURE:  The patient, Mary Wyatt is a 35 y.o. female born on 10-05-1986, is here for treatment of right cheek skin lesion. MRN: [id number]  CONSENT:  Informed consent was obtained directly from the patient. Risks, benefits and alternatives were fully discussed. Specific risks including but not limited to bleeding, infection, hematoma, seroma, scarring, pain, infection, wound healing problems, and need for further surgery were all discussed. The patient did have an ample opportunity to have questions answered to satisfaction.   DESCRIPTION OF PROCEDURE:  Local anesthesia was administered. The patient's operative site was prepped and draped in a sterile fashion. A time out was performed and all information was confirmed to be correct.  The lesion was excised with a 15 blade.  Hemostasis was obtained.  Circumferential undermining was performed and the skin was advanced and closed in layers with interrupted buried Monocryl sutures and 5-0 fast gut for the skin.  The lesion excised measured 2 cm, and the total length of closure measured 2 cm.    The patient tolerated the procedure well.  There were no complications.

## 2020-11-23 LAB — SURGICAL PATHOLOGY

## 2020-11-26 ENCOUNTER — Telehealth: Payer: Self-pay

## 2020-11-26 NOTE — Telephone Encounter (Signed)
Patient called to say that she has pieces of yellow showing through her incision and she wants to know if this is normal.  She said it feels a little tight and it's a tiny bit painful from that pulling.  Please call.

## 2020-11-26 NOTE — Telephone Encounter (Signed)
Patient indicated that the incision from the biopsy is having some yellowish oozing. The incision site is not red, swollen nor hot to touch along with no fevers, chills, nausea, nor vomiting. Advised patient this is most likely the exudate and incision is healing.  However, if she would like for [place] to see her sooner than her follow up on 12/05/20, we can reschedule her appointment.  Patient declined and will wait to come in office for her next appointment. If anything changes, or gets worse she will call back.

## 2020-11-26 NOTE — Telephone Encounter (Signed)
Returned patients call. LMVM to call office back.

## 2020-12-04 ENCOUNTER — Ambulatory Visit: Payer: BC Managed Care – PPO | Admitting: Clinical

## 2020-12-05 ENCOUNTER — Other Ambulatory Visit: Payer: Self-pay

## 2020-12-05 ENCOUNTER — Ambulatory Visit: Payer: BC Managed Care – PPO | Admitting: Plastic Surgery

## 2020-12-05 ENCOUNTER — Encounter: Payer: Self-pay | Admitting: Plastic Surgery

## 2020-12-05 VITALS — BP 109/66 | HR 75 | Temp 98.0°F

## 2020-12-05 DIAGNOSIS — L989 Disorder of the skin and subcutaneous tissue, unspecified: Secondary | ICD-10-CM | POA: Diagnosis not present

## 2020-12-05 NOTE — Progress Notes (Signed)
Patient is here 2 weeks postop from excision of an intradermal nevus from her right nasolabial fold.  She feels like things are going reasonably well but is concerned about the redness around the scar.  On exam she has typical suture line erythema after an excision.  There is a little bit of firmness to it but this looks to be part of the routine healing process.  Pathology was reviewed and was benign.  I had a long discussion with her about how to manage the scarring general expectations.  I expect that this will go on to heal nicely after the initial inflammatory phase of the healing is phased out.  The location and orientation of the scar should be favorable for her.  We discussed a number of scar creams and also discussed sun avoidance.  All of her questions were answered we will plan to see her again on an as-needed basis.

## 2020-12-06 ENCOUNTER — Ambulatory Visit: Payer: BC Managed Care – PPO | Admitting: Plastic Surgery

## 2020-12-10 ENCOUNTER — Telehealth: Payer: Self-pay | Admitting: Plastic Surgery

## 2020-12-10 NOTE — Telephone Encounter (Signed)
Patient called to say that the other day she noticed what appeared to be a whitehead around an incision. She pushed it and it looked like pus. She said it doesn't appear to be anything left at this point but she wanted to know how she should handle it to prevent possible issues. Please call patient to advise. [phone]

## 2020-12-10 NOTE — Telephone Encounter (Signed)
Returned patients call. She indicated she squeezed the incision area the other day thinking a white head formed on the incision. Some pus did come out, and was red in this area as well. She does not have a fever, vomiting, fever, but some nausea. Patient mentioned it looks like more is developing underneath the area again. I advised patient to leave the incision site alone and not to squeeze as this can aggravate the healing process. She can apply Vaseline and use a cold compress.  If this becomes worse or changes by Thursday to give our office a call.  Patient understood and agreed.

## 2020-12-18 ENCOUNTER — Ambulatory Visit: Payer: BC Managed Care – PPO | Admitting: Clinical

## 2021-01-01 ENCOUNTER — Ambulatory Visit: Payer: BC Managed Care – PPO | Admitting: Clinical

## 2021-01-15 ENCOUNTER — Ambulatory Visit: Payer: BC Managed Care – PPO | Admitting: Clinical

## 2021-01-29 ENCOUNTER — Ambulatory Visit: Payer: BC Managed Care – PPO | Admitting: Clinical

## 2021-02-12 ENCOUNTER — Ambulatory Visit: Payer: BC Managed Care – PPO | Admitting: Clinical

## 2021-02-14 ENCOUNTER — Telehealth: Payer: Self-pay

## 2021-02-14 NOTE — Telephone Encounter (Signed)
Referral notes sent from [facility], Phone #: [phone], Fax #: [phone]   Notes sent to scheduling

## 2021-06-04 ENCOUNTER — Ambulatory Visit (INDEPENDENT_AMBULATORY_CARE_PROVIDER_SITE_OTHER): Payer: BC Managed Care – PPO | Admitting: Internal Medicine

## 2021-06-04 ENCOUNTER — Encounter: Payer: Self-pay | Admitting: Internal Medicine

## 2021-06-04 ENCOUNTER — Other Ambulatory Visit: Payer: Self-pay

## 2021-06-04 VITALS — BP 90/55 | HR 63 | Ht 65.0 in | Wt 133.2 lb

## 2021-06-04 DIAGNOSIS — I498 Other specified cardiac arrhythmias: Secondary | ICD-10-CM

## 2021-06-04 DIAGNOSIS — G90A Postural orthostatic tachycardia syndrome (POTS): Secondary | ICD-10-CM

## 2021-06-04 NOTE — Patient Instructions (Addendum)
Medication Instructions:  Your physician recommends that you continue on your current medications as directed. Please refer to the Current Medication list given to you today.  *If you need a refill on your cardiac medications before your next appointment, please call your pharmacy*   Lab Work: None ordered.  If you have labs (blood work) drawn today and your tests are completely normal, you will receive your results only by: [place] (if you have MyChart) OR A paper copy in the mail If you have any lab test that is abnormal or we need to change your treatment, we will call you to review the results.   Testing/Procedures: None ordered.    Follow-Up: At [facility], you and your health needs are our priority.  As part of our continuing mission to provide you with exceptional heart care, we have created designated Provider Care Teams.  These Care Teams include your primary Cardiologist (physician) and Advanced Practice Providers (APPs -  Physician Assistants and Nurse Practitioners) who all work together to provide you with the care you need, when you need it.  We recommend signing up for the patient portal called "MyChart".  Sign up information is provided on this After Visit Summary.  MyChart is used to connect with patients for Virtual Visits (Telemedicine).  Patients are able to view lab/test results, encounter notes, upcoming appointments, etc.  Non-urgent messages can be sent to your provider as well.   To learn more about what you can do with MyChart, go to [email or web address].    Your next appointment:    Monday, 09/23/2021 at 930am with Dr Caryl Comes    Other Instructions  Discussed salt substitutes with a  goal of 2-3 gms of Sodium or 5-7 gm of sodium Chloride daily.  Rehydration solutions include Liquid IV, NUUN, TriOral, and pedialyte advanced care.  Salt tablets include plain salt tablets, SaltStick Vitassium, thermatabs amongst others.

## 2021-06-04 NOTE — Progress Notes (Signed)
ELECTROPHYSIOLOGY CONSULT NOTE  Patient ID: Mary Wyatt, MRN: [id number], DOB/AGE: 10/27/1985 35 y.o. Admit date: (Not on file) Date of Consult: 06/04/2021  Primary Physician: Nickola Major, MD Primary Cardiologist: new     Mary Wyatt is a 35 y.o. female who is being seen today for the evaluation of hypotension at the request of Dr Daron Offer.    HPI Mary Wyatt is a 35 y.o. female referred for hypotension and orthostatic lightheadedness.  She developed symptomatic lightheadedness in the winter 2020.  Triggered by standing up but not necessarily by prolonged standing.  Has been episodic but persisting.  Has not noted particularly aggravating circumstances.  Some shower intolerance.  Heat intolerance or menses intolerance.  Walks for exercise no exercise intolerance.  Diet is salt deplete and fluid deplete.  Was found to have positive COVID antibodies earlier in the winter 2020.  Has a history of mono when she was in high school.  "Never felt quite right since "  History of OCD treated with Abilify with some medication.  Anxiety and depression now somewhat better.  Ongoing therapy for this.  Has been seen by GI, pulmonary and cardiology at [facility] for chest pain.  She describes this as sharp and fleeting.  Week for's note was reviewed.  "Much more consistent with chronic reflux "  His note describes "exertional dyspnea," she noted only that she is out of shape but walks regularly.    He discussed with her the relationship of her headaches to possible sleep abnormality.  Offered the possibility of a agitated saline study if her headaches were refractory to see if there was flow across her atrial septal aneurysm.  We reviewed with her also her electrocardiograms from 2013 through the present describing nonspecific T wave changes from 2019-20    Date Cr K Hgb  4/22 0.67 4.6 13.2         Review of the duodenum and the synopsis creatinine shows blood  pressures ranging from 91 5--150 all over the last 5 years.   Past Medical History:  Diagnosis Date   Abnormal EKG    Dec 2019 and Jan 2020/ saw cardiologist   Allergy    Anemia    Anxiety    Celiac disease    Depression    Genital warts    GERD (gastroesophageal reflux disease)    Interstitial cystitis    Meningioma (HCC)    Post-operative nausea and vomiting    RMSF [facility] spotted fever) 2015   Ulcer       Surgical History:  Past Surgical History:  Procedure Laterality Date   COLONOSCOPY  2013   CRANIOTOMY  09/2018   laproscopic surgery     2013 and 2019   OVARIAN CYST REMOVAL     TONSILLECTOMY     UPPER GASTROINTESTINAL ENDOSCOPY  2013     Home Meds: Current Meds  Medication Sig   ARIPiprazole (ABILIFY) 2 MG tablet Take 2 mg by mouth every morning.   Cholecalciferol (VITAMIN D3) 125 MCG (5000 UT) CAPS    desonide (DESOWEN) 0.05 % cream Apply topically as needed.   ferrous sulfate 325 (65 FE) MG tablet Take 325 mg by mouth daily.   Fluticasone Propionate (FLONASE ALLERGY RELIEF NA) Place into the nose 2 (two) times daily.   hydrocortisone (ANUSOL-HC) 2.5 % rectal cream 2 (two) times daily as needed.   metroNIDAZOLE (METROGEL) 0.75 % gel    Multiple Vitamin (MULTIVITAMIN) tablet Take  1 tablet by mouth daily. Women's MVI- Take one daily   triamcinolone cream (KENALOG) 0.1 % Apply 1 application topically 2 (two) times daily.   [DISCONTINUED] Cholecalciferol 125 MCG (5000 UT) capsule Take 5,000 Units by mouth every other day.    Allergies:  Allergies  Allergen Reactions   Diflucan [Fluconazole] Hives   Gluten Meal Other (See Comments)    Celiac     Social History   Socioeconomic History   Marital status: Single    Spouse name: Not on file   Number of children: 0   Years of education: Not on file   Highest education level: Not on file  Occupational History   Occupation: [employer]  Tobacco Use   Smoking status: Former    Types: Cigarettes    Quit  date: 05/26/2007    Years since quitting: 14.0   Smokeless tobacco: Never   Tobacco comments:    social smoker  Vaping Use   Vaping Use: Never used  Substance and Sexual Activity   Alcohol use: Yes    Comment: occasional   Drug use: No   Sexual activity: Yes    Birth control/protection: Pill  Other Topics Concern   Not on file  Social History Narrative      Marital status:  Single; parents in [place]: none      Employment: [place] [employer] works indoors       Tobacco: none      Alcohol: socially; twice weekly      Drugs: none      Exercise: sporadic; walking sometimes         Daily caffeine    Social Determinants of Radio broadcast assistant Strain: Not on file  Food Insecurity: Not on file  Transportation Needs: Not on file  Physical Activity: Not on file  Stress: Not on file  Social Connections: Not on file  Intimate Partner Violence: Not on file     Family History  Problem Relation Age of Onset   GI problems Sister    Asthma Sister    Anxiety disorder Sister    Heart disease Maternal Grandmother    Anxiety disorder Mother    Depression Mother    Anuerysm Mother    Alcohol abuse Maternal Grandfather    Anxiety disorder Sister    Cancer Maternal Aunt        breast   OCD Maternal Aunt    Colon cancer Neg Hx    Pancreatic cancer Neg Hx        Not to patient's knowlwdge   Rectal cancer Neg Hx        Not to patient's knowledge     ROS:  Please see the history of present illness.     All other systems reviewed and negative.    Physical Exam: Blood pressure (!) 90/55, pulse 63, height 5' 5"  (1.651 m), weight 133 lb 3.2 oz (60.4 kg), SpO2 99 %. General: Well developed, well nourished female in no acute distress. Head: Normocephalic, atraumatic, sclera non-icteric, no xanthomas, nares are without discharge. EENT: normal  Lymph Nodes:  none Neck: Negative for carotid bruits. JVD not elevated. Back:without scoliosis kyphosis; no pectus  abnormality Lungs: Clear bilaterally to auscultation without wheezes, rales, or rhonchi. Breathing is unlabored. Heart: RRR with S1 S2. no murmur . No rubs, or gallops appreciated. Abdomen: Soft, non-tender, non-distended with normoactive bowel sounds. No hepatomegaly. No rebound/guarding. No obvious abdominal masses. Msk:  Strength and tone  appear normal for age. Extremities: No clubbing or cyanosis edema.  Distal pedal pulses are 2+ and equal bilaterally.  Arachnodactyly with a wingspan greater than height and the positive palm  sign Skin: Warm and Dry; no hyperpigmentation Neuro: Alert and oriented X 3. CN III-XII intact Grossly normal sensory and motor function . Psych:  Responds to questions appropriately with a normal affect.      Labs: Cardiac Enzymes No results for input(s): CKTOTAL, CKMB, TROPONINI in the last 72 hours. CBC Lab Results  Component Value Date   WBC 4.2 07/23/2018   HGB 13.2 07/23/2018   HCT 38.7 07/23/2018   MCV 93 07/23/2018   PLT 294 07/23/2018   PROTIME: No results for input(s): LABPROT, INR in the last 72 hours. Chemistry No results for input(s): NA, K, CL, CO2, BUN, CREATININE, CALCIUM, PROT, BILITOT, ALKPHOS, ALT, AST, GLUCOSE in the last 168 hours.  Invalid input(s): LABALBU Lipids No results found for: CHOL, HDL, LDLCALC, TRIG BNP No results found for: PROBNP Thyroid Function Tests: No results for input(s): TSH, T4TOTAL, T3FREE, THYROIDAB in the last 72 hours.  Invalid input(s): FREET3 Miscellaneous Lab Results  Component Value Date   DDIMER 0.34 08/02/2015    Radiology/Studies:  No results found.  EKG: Sinus at 63 Intervals 14/08/41 RSR prime lead V1 and V2 Otherwise normal   Assessment and Plan:  Hypotension-chronic  Chest pain-atypical  Arachnodactyly  Headaches  Celiac disease  OCD/anxiety  Atrial septal aneurysm   Ms Paternostro has symptomatic hypotension without evidence of postural tachycardia.   Not quite sure  why this is the case; symptoms seem to to time with her presumed initial COVID infection (variably positive antibody screens) but she had hypotension as noted above on and off for years prior to this.  She does not have cutaneous evidence of Addison's; sodium is normal.  This can be pursued if she does not respond to salt and water.  Her diet is quite deplete of sodium as well as liquid.  This offers an easy therapeutic target  Discussed salt substitutes with a  goal of 2-3 gms of Sodium or 5-7 gm of sodium Chloride daily.  Rehydration solutions include Liquid IV, NUUN, TriOral, and pedialyte advanced care.  Salt tablets include plain salt tablets, SaltStick Vitassium, thermatabs amongst others.    She will attend to whether her symptoms are worse around the time of her periods.  Has arachnodactyly.  Wonder whether that does not play into some of her chest pain syndromes.  As was told her at [facility], at this point would do nothing for her ASA, but agree that if headaches persist, a bubble study may be of value.      Removed      Virl Axe

## 2021-09-10 ENCOUNTER — Other Ambulatory Visit: Payer: Self-pay | Admitting: Obstetrics and Gynecology

## 2021-09-10 DIAGNOSIS — R928 Other abnormal and inconclusive findings on diagnostic imaging of breast: Secondary | ICD-10-CM

## 2021-09-19 DIAGNOSIS — I959 Hypotension, unspecified: Secondary | ICD-10-CM | POA: Insufficient documentation

## 2021-09-23 ENCOUNTER — Encounter: Payer: Self-pay | Admitting: Internal Medicine

## 2021-09-23 ENCOUNTER — Ambulatory Visit: Payer: BC Managed Care – PPO | Admitting: Internal Medicine

## 2021-09-23 ENCOUNTER — Other Ambulatory Visit: Payer: Self-pay

## 2021-09-23 DIAGNOSIS — I959 Hypotension, unspecified: Secondary | ICD-10-CM | POA: Diagnosis not present

## 2021-09-23 NOTE — Patient Instructions (Addendum)
Medication Instructions:  Your physician recommends that you continue on your current medications as directed. Please refer to the Current Medication list given to you today.  *If you need a refill on your cardiac medications before your next appointment, please call your pharmacy*   Lab Work: None ordered.  If you have labs (blood work) drawn today and your tests are completely normal, you will receive your results only by: [place] (if you have MyChart) OR A paper copy in the mail If you have any lab test that is abnormal or we need to change your treatment, we will call you to review the results.   Testing/Procedures: None ordered.    Follow-Up: At [facility], you and your health needs are our priority.  As part of our continuing mission to provide you with exceptional heart care, we have created designated Provider Care Teams.  These Care Teams include your primary Cardiologist (physician) and Advanced Practice Providers (APPs -  Physician Assistants and Nurse Practitioners) who all work together to provide you with the care you need, when you need it.  We recommend signing up for the patient portal called "MyChart".  Sign up information is provided on this After Visit Summary.  MyChart is used to connect with patients for Virtual Visits (Telemedicine).  Patients are able to view lab/test results, encounter notes, upcoming appointments, etc.  Non-urgent messages can be sent to your provider as well.   To learn more about what you can do with MyChart, go to [email or web address].    Your next appointment:  Thursday 12/26/2021 at 4pm  :1}    Other Instructions  Recommended salt supplementation.  The goal is about 2 g of sodium, not sodium chloride, a day.  Salt supplements include oral ThermaTabs, buffered sodium preparation, SaltStick Vitassium,  Other options include NUUN.  This comes in pill form and is dissolved in liquids.  Other liquid preparations include liquid  IV, Pedialyte advance care, TRI-oral Also discussed the role of compression wear including thigh sleeves and abdominal binders.  Calf compression is not specifically recommended.Marland Kitchen

## 2021-09-23 NOTE — Progress Notes (Signed)
Patient Care Team: Nickola Major, MD as PCP - General (Family Medicine) Louretta Shorten, MD as Consulting Physician (Obstetrics and Gynecology) Mosetta Anis, MD as Referring Physician (Allergy)   HPI  Mary Wyatt is a 35 y.o. female Seen in follow-up for chronic hypotension Lightheadedness without tachycardia In the context of arachnodactyly, OCD and anxiety.   Initially seen 8/22 with recommendations of salt and water repletion \  Has been seen by GI pulmonary and cardiology at [facility].  Chest pain felt to be consistent with reflux.  Found to have an atrial septal aneurysm.  Bubble study discussed but not consummated  The patient denies shortness of breath, nocturnal dyspnea, orthopnea or peripheral edema.  There have been no palpitations, or syncope.  Complains of light headedness primarily with standing typically after minutes, and strange sensation in her chest.  In regards to salt and water, she has been taking the sodium chloride tablets and making an effort to drink more water.    Due to the new job, her anxiety has increased. Additionally, she has had an abnormal mammogram and her grandfather had passed recently as well, which also contributes to increased anxiety/stress.  She admits to drinking occasionally, no Mja  Records and Results Reviewed   Past Medical History:  Diagnosis Date   Abnormal EKG    Dec 2019 and Jan 2020/ saw cardiologist   Allergy    Anemia    Anxiety    Celiac disease    Depression    Genital warts    GERD (gastroesophageal reflux disease)    Interstitial cystitis    Meningioma (HCC)    Post-operative nausea and vomiting    RMSF [facility] spotted fever) 2015   Ulcer     Past Surgical History:  Procedure Laterality Date   COLONOSCOPY  2013   CRANIOTOMY  09/2018   laproscopic surgery     2013 and 2019   OVARIAN CYST REMOVAL     TONSILLECTOMY     UPPER GASTROINTESTINAL ENDOSCOPY  2013    Current Meds   Medication Sig   ARIPiprazole (ABILIFY) 2 MG tablet Take 1 mg by mouth daily.   busPIRone (BUSPAR) 10 MG tablet Take 10 mg by mouth daily.   ferrous sulfate 325 (65 FE) MG tablet Take 325 mg by mouth daily.   hydrocortisone (ANUSOL-HC) 2.5 % rectal cream 2 (two) times daily as needed.   levonorgestrel (MIRENA, 52 MG,) 20 MCG/DAY IUD Mirena 20 mcg/24 hours (8 yrs) 52 mg intrauterine device  Provided By [place].   Multiple Vitamin (MULTIVITAMIN) tablet Take 1 tablet by mouth daily. Women's MVI- Take one daily   sodium chloride 1 g tablet Take 1 g by mouth daily.    Allergies  Allergen Reactions   Diflucan [Fluconazole] Hives   Gluten Meal Other (See Comments)    Celiac       Review of Systems negative except from HPI and PMH  Physical Exam BP 100/70 (BP Location: Left Arm, Patient Position: Sitting, Cuff Size: Normal)   Pulse 82   Ht 5' 5"  (1.651 m)   Wt 136 lb (61.7 kg)   SpO2 99%   BMI 22.63 kg/m  Well developed and well nourished in no acute distress HENT normal E scleral and icterus clear Neck Supple JVP flat; carotids brisk and full Clear to ausculation Regular rate and rhythm, no murmurs gallops or rub Soft with active bowel sounds No clubbing cyanosis  Edema Alert and oriented,  grossly normal motor and sensory function Skin Warm and Dry  ECG    CrCl cannot be calculated (Patient's most recent lab result is older than the maximum 21 days allowed.).   Assessment and  Plan Hypotension-chronic   Chest pain-atypical   Arachnodactyly   Headaches   Celiac disease   OCD/anxiety   Atrial septal aneurysm   Persistent hypotension and lightheadedness with standing.  Discussed again the physiology and the importance of salt and water repletion with goal of 3 g of sodium, not sodium chloride.  Discussed again various forms of salt water repletion.  Discussed the adjunctive benefits potentially of compressive wear as well as fludrocortisone and that these  are tied in their effectiveness to adequacy of salt and water repletion.  Given her complex medication regime, we have elected to hold off on fludrocortisone she is salt and water repletion and abdominal binder.  She is continuing to work on her mental health.  Kudus to her.   Current medicines are reviewed at length with the patient today .  The patient does not  have concerns regarding medicines.   I,Jordan Kelly,acting as a Education administrator for Virl Axe, MD.,have documented all relevant documentation on the behalf of Virl Axe, MD,as directed by  Virl Axe, MD while in the presence of Virl Axe, MD. I, Virl Axe, MD, have reviewed all documentation for this visit. The documentation on 09/23/21 for the exam, diagnosis, procedures, and orders are all accurate and complete.

## 2021-09-23 NOTE — Progress Notes (Deleted)
      Patient Care Team: Nickola Major, MD as PCP - General (Family Medicine) Louretta Shorten, MD as Consulting Physician (Obstetrics and Gynecology) Mosetta Anis, MD as Referring Physician (Allergy)   HPI  Mary Wyatt is a 35 y.o. female Seen in follow-up for chronic hypotension Lightheadedness without tachycardia In the context of arachnodactyly, OCD and anxiety.   Initially seen 8/22 with recommendations of salt and water repletion \  Has been seen by GI pulmonary and cardiology at [facility].  Chest pain felt to be consistent with reflux.  Found to have an atrial septal aneurysm.  Bubble study discussed but not consummated  The patient denies chest pain***, shortness of breath***, nocturnal dyspnea***, orthopnea*** or peripheral edema***.  There have been no palpitations***, lightheadedness*** or syncope***.  Complains of ***.   Records and Results Reviewed***  Past Medical History:  Diagnosis Date   Abnormal EKG    Dec 2019 and Jan 2020/ saw cardiologist   Allergy    Anemia    Anxiety    Celiac disease    Depression    Genital warts    GERD (gastroesophageal reflux disease)    Interstitial cystitis    Meningioma (HCC)    Post-operative nausea and vomiting    RMSF [facility] spotted fever) 2015   Ulcer     Past Surgical History:  Procedure Laterality Date   COLONOSCOPY  2013   CRANIOTOMY  09/2018   laproscopic surgery     2013 and 2019   OVARIAN CYST REMOVAL     TONSILLECTOMY     UPPER GASTROINTESTINAL ENDOSCOPY  2013    No outpatient medications have been marked as taking for the 09/23/21 encounter (Appointment) with Deboraha Sprang, MD.    Allergies  Allergen Reactions   Diflucan [Fluconazole] Hives   Gluten Meal Other (See Comments)    Celiac       Review of Systems negative except from HPI and PMH  Physical Exam There were no vitals taken for this visit. Well developed and well nourished in no acute distress HENT normal E  scleral and icterus clear Neck Supple JVP flat; carotids brisk and full Clear to ausculation {CARD RHYTHM:10874} ***Regular rate and rhythm, no murmurs gallops or rub Soft with active bowel sounds No clubbing cyanosis {Numbers; edema:17696} Edema Alert and oriented, grossly normal motor and sensory function Skin Warm and Dry  ECG ***  CrCl cannot be calculated (Patient's most recent lab result is older than the maximum 21 days allowed.).   Assessment and  Plan Hypotension-chronic   Chest pain-atypical   Arachnodactyly   Headaches   Celiac disease   OCD/anxiety   Atrial septal aneurysm      Current medicines are reviewed at length with the patient today .  The patient does not*** have concerns regarding medicines.

## 2021-09-25 ENCOUNTER — Ambulatory Visit
Admission: RE | Admit: 2021-09-25 | Discharge: 2021-09-25 | Disposition: A | Discharge status: Home / Self Care | Payer: BC Managed Care – PPO | Source: Ambulatory Visit | Attending: Obstetrics and Gynecology | Admitting: Obstetrics and Gynecology

## 2021-09-25 DIAGNOSIS — R928 Other abnormal and inconclusive findings on diagnostic imaging of breast: Secondary | ICD-10-CM

## 2021-10-10 ENCOUNTER — Other Ambulatory Visit: Payer: BC Managed Care – PPO

## 2021-12-24 ENCOUNTER — Ambulatory Visit: Payer: BC Managed Care – PPO | Admitting: Internal Medicine

## 2021-12-26 ENCOUNTER — Other Ambulatory Visit: Payer: Self-pay

## 2021-12-26 ENCOUNTER — Ambulatory Visit: Payer: BC Managed Care – PPO | Admitting: Internal Medicine

## 2021-12-30 ENCOUNTER — Encounter: Payer: Self-pay | Admitting: Internal Medicine

## 2022-02-19 ENCOUNTER — Ambulatory Visit (INDEPENDENT_AMBULATORY_CARE_PROVIDER_SITE_OTHER): Payer: BC Managed Care – PPO | Admitting: Student in an Organized Health Care Education/Training Program

## 2022-02-19 DIAGNOSIS — F411 Generalized anxiety disorder: Secondary | ICD-10-CM | POA: Diagnosis not present

## 2022-02-19 DIAGNOSIS — F429 Obsessive-compulsive disorder, unspecified: Secondary | ICD-10-CM | POA: Diagnosis not present

## 2022-02-19 DIAGNOSIS — F330 Major depressive disorder, recurrent, mild: Secondary | ICD-10-CM | POA: Diagnosis not present

## 2022-02-19 NOTE — Progress Notes (Signed)
Psychiatric Initial Adult Assessment  ? ?Patient Identification: Mary Wyatt ?MRN:  [id number] ?Date of Evaluation:  02/20/2022 ?Referral Source: Dr. Lulu Riding ?Chief Complaint:  No chief complaint on file. ? ?Visit Diagnosis:  ?  ICD-10-CM   ?1. Generalized anxiety disorder  F41.1   ?  ?2. Obsessive-compulsive disorder, unspecified type  F42.9   ?  ?3. Mild episode of recurrent major depressive disorder (HCC)  F33.0   ?  ? ? ?History of Present Illness:   ?Mary Wyatt is a 36 yr old female who presents today for evaluation for [place] due to treatment resistant depression.  PPHx is significant for Depression, Anxiety, and OCD. ? ?She reports that her anxiety and depression have been interfering with her life for several years.  She states had significant impact on her ability to maintain full-time jobs for extended periods of time.  She states that COVID particularly worsened her symptoms and her OCD symptoms.  She reports that she spends most days just feeling fatigued and tired and often cries when she gets home from work.  She states that she has recently noticed she has become more anhedonic as she no longer enjoys listening to music which is something she liked to do. ? ?She reports past psychiatric history significant for depression, anxiety, and OCD.  She reports no history of inpatient hospitalizations.  She reports no history injurious behavior.  She reports being on the following medications in the past without relief of symptoms: Zoloft, Prozac, Lexapro, amitriptyline, Abilify, and Pristiq.  She reports her psychiatrist had just started Pristiq less than a month ago however she had significant nausea from it and had to stop. ? ?She currently works at the [employer] as an Web designer.  She did graduate high school and college with degrees in [place] and anthropology.  She reports drinking 2 glasses of wine on the weekends.  She reports no tobacco use.  She reports some THC  gummy use. ? ?Discussed the initial [place] visit in the process of finding the correct positioning of the device to target the dorsolateral prefrontal cortex.  Discussed risks and benefits of the procedure including but not limited to tenderness of the scalp and neck, twitching, and the most common being mild headache after the first few sessions.  All questions were invited and answered.  She reports that she has had brain surgery of the removal of a meningioma, but confirms there were no clips or staples placed.  She reports having 2 dental implants on her lower jaw.  She reports no pacemaker or other implanted devices.  Advised her to avoid wearing jewelry or be able to remove all jewelry.  Advised her to avoid scheduling any vacations during the sequence of [place].  Discussed that a note could be provided for work to excuse tardiness during the treatment sequence.  She was agreeable to undergoing [place] and awaits a call from the office to schedule her appointments. ? ?She reports no SI, HI, or AVH.  She reports some mild nausea but otherwise reports no other concerns at present. ? ? ?Associated Signs/Symptoms: ?Depression Symptoms:  depressed mood, ?anhedonia, ?insomnia, ?fatigue, ?hopelessness, ?recurrent thoughts of death, ?anxiety, ?panic attacks, ?loss of energy/fatigue, ?disturbed sleep, ?decreased appetite, ?(Hypo) Manic Symptoms:   Reports none ?Anxiety Symptoms:  Excessive Worry, ?Panic Symptoms, ?Obsessive Compulsive Symptoms:   Checking, ?Handwashing,, ?Psychotic Symptoms:   Reports none ?PTSD Symptoms: ?Negative ? ?Past Psychiatric History: Depression, Anxiety, OCD. ? ?Previous Psychotropic Medications: Yes Abilify, Prozac, Lexapro,  Amitriptyline, Pristiq. ? ?Substance Abuse History in the last 12 months:  No. ? ?Consequences of Substance Abuse: ?NA ? ?Past Medical History:  ?Past Medical History:  ?Diagnosis Date  ? Abnormal EKG   ? Dec 2019 and Jan 2020/ saw cardiologist  ? Allergy   ? Anemia   ? Anxiety    ? Celiac disease   ? Depression   ? Genital warts   ? GERD (gastroesophageal reflux disease)   ? Interstitial cystitis   ? Meningioma ([place])   ? Post-operative nausea and vomiting   ? [facility] spotted fever) 2015  ? Ulcer   ?  ?Past Surgical History:  ?Procedure Laterality Date  ? COLONOSCOPY  2013  ? CRANIOTOMY  09/2018  ? laproscopic surgery    ? 2013 and 2019  ? OVARIAN CYST REMOVAL    ? TONSILLECTOMY    ? UPPER GASTROINTESTINAL ENDOSCOPY  2013  ? ? ?Family Psychiatric History: Maternal Grandfather- unknown diagnosis was on Lithium, EtOH abuse ?Aunt- OCD ?Mother and Sister- Depression ?No known Suicides ? ?Family History:  ?Family History  ?Problem Relation Age of Onset  ? GI problems Sister   ? Asthma Sister   ? Anxiety disorder Sister   ? Heart disease Maternal Grandmother   ? Anxiety disorder Mother   ? Depression Mother   ? Anuerysm Mother   ? Alcohol abuse Maternal Grandfather   ? Anxiety disorder Sister   ? Cancer Maternal Aunt   ?     breast  ? OCD Maternal Aunt   ? Colon cancer Neg Hx   ? Pancreatic cancer Neg Hx   ?     Not to patient's knowlwdge  ? Rectal cancer Neg Hx   ?     Not to patient's knowledge  ? ? ?Social History:   ?Social History  ? ?Socioeconomic History  ? Marital status: Single  ?  Spouse name: Not on file  ? Number of children: 0  ? Years of education: Not on file  ? Highest education level: Not on file  ?Occupational History  ? Occupation: [employer]  ?Tobacco Use  ? Smoking status: Former  ?  Types: Cigarettes  ?  Quit date: 05/26/2007  ?  Years since quitting: 14.7  ? Smokeless tobacco: Never  ? Tobacco comments:  ?  social smoker  ?Vaping Use  ? Vaping Use: Never used  ?Substance and Sexual Activity  ? Alcohol use: Yes  ?  Comment: occasional  ? Drug use: No  ? Sexual activity: Yes  ?  Birth control/protection: Pill  ?Other Topics Concern  ? Not on file  ?Social History Narrative  ?    Marital status:  Single; parents in [place]  ?    Children: none  ?    Employment: [place] works indoors   ?    Tobacco: none  ?    Alcohol: socially; twice weekly  ?    Drugs: none  ?    Exercise: sporadic; walking sometimes  ?      ? Daily caffeine   ? ?Social Determinants of Health  ? ?Financial Resource Strain: Not on file  ?Food Insecurity: Not on file  ?Transportation Needs: Not on file  ?Physical Activity: Not on file  ?Stress: Not on file  ?Social Connections: Not on file  ? ? ?Additional Social History: Currently working at [employer] as an Web designer. ? ?Allergies:   ?Allergies  ?Allergen Reactions  ? Diflucan [Fluconazole] Hives  ?  Gluten Meal Other (See Comments)  ?  Celiac   ? ? ?Metabolic Disorder Labs: ?No results found for: HGBA1C, MPG ?Lab Results  ?Component Value Date  ? PROLACTIN 7.6 12/03/2011  ? ?No results found for: CHOL, TRIG, HDL, CHOLHDL, VLDL, LDLCALC ?Lab Results  ?Component Value Date  ? TSH 1.260 12/23/2017  ? ? ?Therapeutic Level Labs: ?No results found for: LITHIUM ?No results found for: CBMZ ?No results found for: VALPROATE ? ?Current Medications: ?Current Outpatient Medications  ?Medication Sig Dispense Refill  ? ARIPiprazole (ABILIFY) 2 MG tablet Take 1 mg by mouth daily.    ? busPIRone (BUSPAR) 10 MG tablet Take 10 mg by mouth daily.    ? ferrous sulfate 325 (65 FE) MG tablet Take 325 mg by mouth daily.    ? hydrocortisone (ANUSOL-HC) 2.5 % rectal cream 2 (two) times daily as needed.    ? levonorgestrel (MIRENA, 52 MG,) 20 MCG/DAY IUD Mirena 20 mcg/24 hours (8 yrs) 52 mg intrauterine device ? Provided By [facility].    ? Multiple Vitamin (MULTIVITAMIN) tablet Take 1 tablet by mouth daily. Women's MVI- Take one daily    ? sodium chloride 1 g tablet Take 1 g by mouth daily.    ? ?No current facility-administered medications for this visit.  ? ? ?Musculoskeletal: ?Strength & Muscle Tone: within normal limits ?Gait & Station: normal ?Patient leans: N/A ? ?Psychiatric Specialty Exam: ?Review of Systems  ?Respiratory:  Negative for chest  tightness and shortness of breath.   ?Cardiovascular:  Negative for chest pain.  ?Gastrointestinal:  Positive for nausea (mild). Negative for abdominal pain, constipation, diarrhea and vomiting.  ?Neurological:  Ne

## 2022-03-19 ENCOUNTER — Encounter: Payer: Self-pay | Admitting: Internal Medicine

## 2022-03-19 ENCOUNTER — Ambulatory Visit: Payer: BC Managed Care – PPO | Admitting: Internal Medicine

## 2022-03-19 VITALS — Ht 65.0 in | Wt 154.6 lb

## 2022-03-19 DIAGNOSIS — I951 Orthostatic hypotension: Secondary | ICD-10-CM | POA: Diagnosis not present

## 2022-03-19 NOTE — Progress Notes (Signed)
Patient Care Team: Nickola Major, MD as PCP - General (Family Medicine) Louretta Shorten, MD as Consulting Physician (Obstetrics and Gynecology) Mosetta Anis, MD as Referring Physician (Allergy)   HPI  Mary Wyatt is a 36 y.o. female Seen in follow-up for chronic hypotension Lightheadedness without tachycardia In the context of arachnodactyly, OCD and anxiety.   Initially seen 8/22 with recommendations of salt and water repletion  She has been doing significantly better.  She has had no lightheadedness.  No palpitations.,  Her anxiety is to are better apart from her job stresses.  Water is replete, salt does not \  Has been seen by GI pulmonary and [facility].  Chest pain felt to be consistent with reflux.  Found to have an atrial septal aneurysm.  Bubble study discussed but not consummated     Records and Results Reviewed   Past Medical History:  Diagnosis Date   Abnormal EKG    Dec 2019 and Jan 2020/ saw cardiologist   Allergy    Anemia    Anxiety    Celiac disease    Depression    Genital warts    GERD (gastroesophageal reflux disease)    Interstitial cystitis    Meningioma (HCC)    Post-operative nausea and vomiting    [facility] spotted fever) 2015   Ulcer     Past Surgical History:  Procedure Laterality Date   COLONOSCOPY  2013   CRANIOTOMY  09/2018   laproscopic surgery     2013 and 2019   OVARIAN CYST REMOVAL     TONSILLECTOMY     UPPER GASTROINTESTINAL ENDOSCOPY  2013    Current Meds  Medication Sig   busPIRone (BUSPAR) 10 MG tablet Take 1 tablet by mouth 2 (two) times daily.   Cholecalciferol 125 MCG (5000 UT) capsule Take 5,000 Units by mouth 2 (two) times a week.   ferrous sulfate 325 (65 FE) MG tablet Take 325 mg by mouth daily.   hydrocortisone (ANUSOL-HC) 25 MG suppository Place 25 mg rectally as needed for hemorrhoids or anal itching.   levonorgestrel (MIRENA, 52 MG,) 20 MCG/DAY IUD Mirena 20 mcg/24  hours (8 yrs) 52 mg intrauterine device  Provided By [facility].   Multiple Vitamin (MULTIVITAMIN) tablet Take 1 tablet by mouth daily. Women's MVI- Take one daily   nitrofurantoin, macrocrystal-monohydrate, (MACROBID) 100 MG capsule Take 100 mg by mouth 2 (two) times daily. Take twice daily for 7 days    Allergies  Allergen Reactions   Diflucan [Fluconazole] Hives   Gluten Meal Other (See Comments) and Nausea And Vomiting    Celiac       Review of Systems negative except from HPI and PMH  Physical Exam Ht 5' 5"  (1.651 m)   Wt 154 lb 9.6 oz (70.1 kg)   SpO2 96%   BMI 25.73 kg/m  Well developed and nourished in no acute distress HENT normal Neck supple with JVP-  flat *** Clear Regular rate and rhythm, no murmurs or gallops Abd-soft with active BS No Clubbing cyanosis edema Skin-warm and dry A & Oriented  Grossly normal sensory and motor function  ECG sinus at 77 Intervals 14/08/39  CrCl cannot be calculated (Patient's most recent lab result is older than the maximum 21 days allowed.).   Assessment and  Plan Hypotension-chronic   Arachnodactyly   Headaches   Celiac disease   OCD/anxiety   Atrial septal aneurysm   Doing much better.  Thrilled.  Continue salt and water repletion.

## 2022-03-19 NOTE — Patient Instructions (Signed)

## 2022-08-07 ENCOUNTER — Ambulatory Visit (INDEPENDENT_AMBULATORY_CARE_PROVIDER_SITE_OTHER): Payer: Self-pay | Admitting: Nurse Practitioner

## 2022-08-07 DIAGNOSIS — Z23 Encounter for immunization: Secondary | ICD-10-CM

## 2022-11-28 ENCOUNTER — Emergency Department (HOSPITAL_BASED_OUTPATIENT_CLINIC_OR_DEPARTMENT_OTHER): Payer: BC Managed Care – PPO

## 2022-11-28 ENCOUNTER — Encounter (HOSPITAL_BASED_OUTPATIENT_CLINIC_OR_DEPARTMENT_OTHER): Payer: Self-pay

## 2022-11-28 ENCOUNTER — Emergency Department (HOSPITAL_BASED_OUTPATIENT_CLINIC_OR_DEPARTMENT_OTHER)
Admission: EM | Admit: 2022-11-28 | Discharge: 2022-11-28 | Disposition: A | Discharge status: Home / Self Care | Payer: BC Managed Care – PPO | Attending: Emergency Medicine | Admitting: Emergency Medicine

## 2022-11-28 ENCOUNTER — Other Ambulatory Visit: Payer: Self-pay

## 2022-11-28 DIAGNOSIS — R519 Headache, unspecified: Secondary | ICD-10-CM

## 2022-11-28 LAB — CBC WITH DIFFERENTIAL/PLATELET
Abs Immature Granulocytes: 0 10*3/uL (ref 0.00–0.07)
Basophils Absolute: 0 10*3/uL (ref 0.0–0.1)
Basophils Relative: 1 %
Eosinophils Absolute: 0.1 10*3/uL (ref 0.0–0.5)
Eosinophils Relative: 2 %
HCT: 42.9 % (ref 36.0–46.0)
Hemoglobin: 15.1 g/dL — ABNORMAL HIGH (ref 12.0–15.0)
Immature Granulocytes: 0 %
Lymphocytes Relative: 28 %
Lymphs Abs: 1.5 10*3/uL (ref 0.7–4.0)
MCH: 31.8 pg (ref 26.0–34.0)
MCHC: 35.2 g/dL (ref 30.0–36.0)
MCV: 90.3 fL (ref 80.0–100.0)
Monocytes Absolute: 0.4 10*3/uL (ref 0.1–1.0)
Monocytes Relative: 7 %
Neutro Abs: 3.4 10*3/uL (ref 1.7–7.7)
Neutrophils Relative %: 62 %
Platelets: 301 10*3/uL (ref 150–400)
RBC: 4.75 MIL/uL (ref 3.87–5.11)
RDW: 12.6 % (ref 11.5–15.5)
WBC: 5.4 10*3/uL (ref 4.0–10.5)
nRBC: 0 % (ref 0.0–0.2)

## 2022-11-28 LAB — BASIC METABOLIC PANEL
Anion gap: 11 (ref 5–15)
BUN: 11 mg/dL (ref 6–20)
CO2: 21 mmol/L — ABNORMAL LOW (ref 22–32)
Calcium: 10.3 mg/dL (ref 8.9–10.3)
Chloride: 104 mmol/L (ref 98–111)
Creatinine, Ser: 0.67 mg/dL (ref 0.44–1.00)
GFR, Estimated: >60 mL/min (ref >60)
Glucose, Bld: 102 mg/dL — ABNORMAL HIGH (ref 70–99)
Potassium: 4.1 mmol/L (ref 3.5–5.1)
Sodium: 136 mmol/L (ref 135–145)

## 2022-11-28 LAB — PREGNANCY, URINE: Preg Test, Ur: NEGATIVE

## 2022-11-28 MED ORDER — PROCHLORPERAZINE EDISYLATE 10 MG/2ML IJ SOLN
10.0000 mg | Freq: Once | INTRAMUSCULAR | Status: AC
  Administered 2022-11-28: 10 mg via INTRAVENOUS
  Filled 2022-11-28: qty 2

## 2022-11-28 MED ORDER — IOHEXOL 350 MG/ML SOLN
100.0000 mL | Freq: Once | INTRAVENOUS | Status: AC | PRN
  Administered 2022-11-28: 75 mL via INTRAVENOUS

## 2022-11-28 MED ORDER — DIPHENHYDRAMINE HCL 50 MG/ML IJ SOLN
25.0000 mg | Freq: Once | INTRAMUSCULAR | Status: AC
  Administered 2022-11-28: 25 mg via INTRAVENOUS
  Filled 2022-11-28: qty 1

## 2022-11-28 MED ORDER — SODIUM CHLORIDE 0.9 % IV BOLUS
1000.0000 mL | Freq: Once | INTRAVENOUS | Status: AC
  Administered 2022-11-28: 1000 mL via INTRAVENOUS

## 2022-11-28 NOTE — ED Notes (Signed)
ED Provider at bedside. 

## 2022-11-28 NOTE — ED Triage Notes (Signed)
Patient here POV from Home.  Endorses Headache that began yesterday PM that was Posterior and Pulsating in nature. Pulsating has subsided and is now Dull. Sent by PCP for Assessment.   History of Craniotomy in 2019.  NAD Noted during Triage. A&Ox4. GCS 15. Ambulatory.

## 2022-11-28 NOTE — ED Notes (Signed)
Patient transported to CT 

## 2022-11-28 NOTE — ED Provider Notes (Signed)
[facility] Provider Note   CSN: [id number] Arrival date & time: 11/28/22  1207     History  Chief Complaint  Patient presents with   Headache    Mary Wyatt is a 37 y.o. female.  Here with headache.  History of meningioma, anxiety, depression.  Headache started about 12 hours ago.  Felt fairly maximal at onset lasting for about 5 or 10 minutes in the back of the head with a pulsatile feeling.  Has had a mild headache ever since.  History of aneurysm in the family, she thinks her mom had a brain aneurysm.  She denies any chest pain or shortness of breath or vision changes.  She had been on nortriptyline for anxiety and depression recently but stopped.  She is not on any blood thinners.  She denies any weakness or numbness or tingling.  No recent illness.  No neck pain.  The history is provided by the patient.       Home Medications Prior to Admission medications   Medication Sig Start Date End Date Taking? Authorizing Provider  ALPRAZolam (XANAX) 0.25 MG tablet Take 0.25 mg by mouth as needed. Patient not taking: Reported on 03/19/2022    [provider]  ARIPiprazole (ABILIFY) 2 MG tablet Take 1 mg by mouth daily.    [provider]  busPIRone (BUSPAR) 10 MG tablet Take 10 mg by mouth daily. 08/31/21   [provider]  busPIRone (BUSPAR) 10 MG tablet Take 1 tablet by mouth 2 (two) times daily. 10/18/21   [provider]  Cholecalciferol 125 MCG (5000 UT) capsule Take 5,000 Units by mouth 2 (two) times a week.    [provider]  ferrous sulfate 325 (65 FE) MG tablet Take 325 mg by mouth daily.    [provider]  hydrocortisone (ANUSOL-HC) 2.5 % rectal cream 2 (two) times daily as needed. 05/29/21   [provider]  hydrocortisone (ANUSOL-HC) 25 MG suppository Place 25 mg rectally as needed for hemorrhoids or anal itching.    [provider]  levonorgestrel  (MIRENA, 52 MG,) 20 MCG/DAY IUD Mirena 20 mcg/24 hours (8 yrs) 52 mg intrauterine device  Provided By [facility]. 08/30/21   [provider]  Multiple Vitamin (MULTIVITAMIN) tablet Take 1 tablet by mouth daily. Women's MVI- Take one daily    [provider]  nitrofurantoin, macrocrystal-monohydrate, (MACROBID) 100 MG capsule Take 100 mg by mouth 2 (two) times daily. Take twice daily for 7 days    [provider]  sodium chloride 1 g tablet Take 1 g by mouth daily.    [provider]      Allergies    Diflucan [fluconazole] and Gluten meal    Review of Systems   Review of Systems  Physical Exam Updated Vital Signs BP (!) 154/81 (BP Location: Right Arm)   Pulse (!) 104   Temp 98.3 F (36.8 C) (Oral)   Resp 18   Ht 5' 5"$  (1.651 m)   Wt 70.1 kg   SpO2 100%   BMI 25.72 kg/m  Physical Exam Vitals and nursing note reviewed.  Constitutional:      General: She is not in acute distress.    Appearance: She is well-developed. She is not ill-appearing.  HENT:     Head: Normocephalic and atraumatic.     Mouth/Throat:     Mouth: Mucous membranes are moist.  Eyes:     General: No visual field deficit.  Extraocular Movements: Extraocular movements intact.     Conjunctiva/sclera: Conjunctivae normal.     Pupils: Pupils are equal, round, and reactive to light.  Cardiovascular:     Rate and Rhythm: Normal rate and regular rhythm.     Heart sounds: Normal heart sounds. No murmur heard. Pulmonary:     Effort: Pulmonary effort is normal. No respiratory distress.     Breath sounds: Normal breath sounds.  Abdominal:     Palpations: Abdomen is soft.     Tenderness: There is no abdominal tenderness.  Musculoskeletal:        General: No swelling.     Cervical back: Normal range of motion and neck supple.  Skin:    General: Skin is warm and dry.     Capillary Refill: Capillary refill takes less than 2 seconds.  Neurological:     Mental Status: She is  alert and oriented to person, place, and time.     Cranial Nerves: No cranial nerve deficit, dysarthria or facial asymmetry.     Sensory: No sensory deficit.     Motor: No weakness.     Coordination: Coordination normal.     Comments: 5+ out of 5 strength throughout, normal sensation, no drift, normal finger-nose-finger, normal speech, no nystagmus  Psychiatric:        Mood and Affect: Mood normal.     ED Results / Procedures / Treatments   Labs (all labs ordered are listed, but only abnormal results are displayed) Labs Reviewed  CBC WITH DIFFERENTIAL/PLATELET - Abnormal; Notable for the following components:      Result Value   Hemoglobin 15.1 (*)    All other components within normal limits  BASIC METABOLIC PANEL - Abnormal; Notable for the following components:   CO2 21 (*)    Glucose, Bld 102 (*)    All other components within normal limits  PREGNANCY, URINE    EKG None  Radiology CT VENOGRAM HEAD  Result Date: 11/28/2022 CLINICAL DATA:  Dural venous sinus thrombosis suspected. EXAM: CT VENOGRAM HEAD TECHNIQUE: Venographic phase images of the brain were obtained following the administration of intravenous contrast. Multiplanar reformats and maximum intensity projections were generated. RADIATION DOSE REDUCTION: This exam was performed according to the departmental dose-optimization program which includes automated exposure control, adjustment of the mA and/or kV according to patient size and/or use of iterative reconstruction technique. CONTRAST:  35m OMNIPAQUE IOHEXOL 350 MG/ML SOLN COMPARISON:  Same day CTA head/neck. FINDINGS: Normal dural venous sinus anatomy. Codominant transverse sinuses. Superior sagittal sinus, torcula, transverse and sigmoid sinuses are patent bilaterally without stenosis or thrombosis. IMPRESSION: Patent dural venous sinuses. Electronically Signed   By: WEmmit AlexandersM.D.   On: 11/28/2022 13:56   CT ANGIO HEAD NECK W WO CM  Result Date:  11/28/2022 CLINICAL DATA:  Neuro deficit, acute, stroke suspected. Posterior headache, pulsating in nature. Now dull. EXAM: CT HEAD WITHOUT CONTRAST CT ANGIOGRAPHY OF THE HEAD AND NECK TECHNIQUE: Contiguous axial images were obtained from the base of the skull through the vertex without intravenous contrast. Multidetector CT imaging of the head and neck was performed using the standard protocol during bolus administration of intravenous contrast. Multiplanar CT image reconstructions and MIPs were obtained to evaluate the vascular anatomy. Carotid stenosis measurements (when applicable) are obtained utilizing NASCET criteria, using the distal internal carotid diameter as the denominator. RADIATION DOSE REDUCTION: This exam was performed according to the departmental dose-optimization program which includes automated exposure control, adjustment of the mA and/or kV  according to patient size and/or use of iterative reconstruction technique. CONTRAST:  17m OMNIPAQUE IOHEXOL 350 MG/ML SOLN COMPARISON:  None available. FINDINGS: CT HEAD Brain: No acute hemorrhage, mass effect or midline shift. Gray-white differentiation is preserved. No hydrocephalus. No extra-axial collection. Basilar cisterns are patent. Vascular: No hyperdense vessel or unexpected calcification. Skull: Prior right frontal craniotomy. Sinuses/Orbits: Unremarkable. CTA NECK Aortic arch: Two-vessel arch configuration with common origin of the right brachiocephalic and left common carotid arteries. Arch vessel origins are patent. Right carotid system: The common and internal carotid arteries are patent to the skull base without stenosis, aneurysm or dissection. Left carotid system: Normal left common carotid. Mild irregular narrowing of the mid and distal left common carotid seen on coronal images 36 series 13 and 178 series 10. Vertebral arteries:Patent from the origin to the confluence with the basilar without stenosis or dissection. Skeleton: No  suspicious bone lesions. Other neck: Unremarkable. CTA HEAD Anterior circulation: Intracranial ICAs are patent without stenosis or aneurysm. The proximal ACAs and MCAs are patent without stenosis or aneurysm. Diffuse, mild, irregular narrowing of the distal branches of the ACAs and MCAs bilaterally. Posterior circulation: Normal basilar artery. The SCAs, AICAs and PICAs are patent proximally. The PCAs are patent proximally without stenosis or aneurysm. Diffuse, mild, irregular narrowing of the distal branches of the PCAs bilaterally. Venous sinuses: Patent. Anatomic variants: None. IMPRESSION: 1. No acute intracranial abnormality. 2. No large vessel occlusion. 3. Diffuse, mild, irregular narrowing of the left ICA, as well as distal branches of the Circle of Willis. Differential considerations include vasculitis, reversible cerebral vasoconstriction syndrome, and fibromuscular dysplasia. Electronically Signed   By: WEmmit AlexandersM.D.   On: 11/28/2022 13:50    Procedures Procedures    Medications Ordered in ED Medications  prochlorperazine (COMPAZINE) injection 10 mg (10 mg Intravenous Given 11/28/22 1346)  diphenhydrAMINE (BENADRYL) injection 25 mg (25 mg Intravenous Given 11/28/22 1344)  sodium chloride 0.9 % bolus 1,000 mL (1,000 mLs Intravenous New Bag/Given 11/28/22 1343)  iohexol (OMNIPAQUE) 350 MG/ML injection 100 mL (75 mLs Intravenous Contrast Given 11/28/22 1254)    ED Course/ Medical Decision Making/ A&P                             Medical Decision Making Amount and/or Complexity of Data Reviewed Labs: ordered. Radiology: ordered.  Risk Prescription drug management.   Mary BAUMBACHis here with headache.  History of depression, anxiety, acid reflux, meningioma s/p resection who presents after headache.  Patient with normal vitals.  No fever.  Normal neurological exam.  About 10 hours ago she had a pretty severe headache that came on fairly maximally last about 5 or 10 minutes to  the left side of her head.  She has had a dull headache since.  She had MRI couple months ago that was unremarkable.  Meningioma does not appear to be visible on MRI anymore.  She has had headaches in the past but this was one of the worst headache she has had.  She does admit to some marijuana gummy use, she had recently been on amitriptyline for few weeks but was not liking how that made her feel and she stopped that a few days ago.  She has not had any recent illnesses.  She denies any weakness, numbness, vision changes, speech changes.  Differential diagnosis is migraine versus thrombosis versus head bleed.  She appears pretty well and overall doubt that there is a  subarachnoid hemorrhage.  However we will get a CTA of the head and neck and a CT venogram.  Will get basic labs give her headache cocktail with Compazine and Benadryl and IV fluids.  Per my review and interpretation of labs is no significant anemia or electrolyte abnormality or kidney injury.  Pregnancy test is negative.  Per radiology report CT venogram shows patent dural venous sinuses.  There is no thrombosis.  CTA of the head and neck shows no acute intracranial abnormality.  No large vessel occlusion.  Radiology does note some diffuse mild irregular narrowing of the left ICA as well as some distal branches of the circle of Willis.  Differential that radiology brings up is vasculitis versus reversible cerebral vasoconstriction syndrome versus fibromuscular dysplasia.  Overall reevaluation headache is about a 1 out of 10.  She feels much better.  I talked with Dr. Malen Gauze with neurology and overall given that she has had good improvement still not having a severe headache and appears comfortable she can follow-up closely with her neurologist outpatient.  She had a recent MRI that was fairly unremarkable.  No concern for stroke.  Likely a complex migraine.  No further workup needed to rule out subarachnoid hemorrhage.  Overall patient feels  comfortable with the plan.  She was instructed to come back if headache gets worse or becomes more intense like she had last night.  I recommended that she possibly abstained from marijuana gummy use and any strenuous activities for the next day or 2.  Recommend Tylenol and ibuprofen as needed.  She will follow-up with her neurologist.  Patient discharged in good condition.  This chart was dictated using voice recognition software.  Despite best efforts to proofread,  errors can occur which can change the documentation meaning.         Final Clinical Impression(s) / ED Diagnoses Final diagnoses:  Acute nonintractable headache, unspecified headache type    Rx / DC Orders ED Discharge Orders     None         Lennice Sites, DO 11/28/22 1429

## 2022-11-28 NOTE — Discharge Instructions (Signed)
Follow-up with your neurologist.  Please return if symptoms worsen as discussed.

## 2022-12-08 ENCOUNTER — Encounter (HOSPITAL_COMMUNITY): Payer: Self-pay

## 2022-12-08 ENCOUNTER — Emergency Department (HOSPITAL_COMMUNITY): Payer: BC Managed Care – PPO

## 2022-12-08 ENCOUNTER — Emergency Department (HOSPITAL_COMMUNITY)
Admission: EM | Admit: 2022-12-08 | Discharge: 2022-12-08 | Disposition: A | Discharge status: Home / Self Care | Payer: BC Managed Care – PPO | Attending: Emergency Medicine | Admitting: Emergency Medicine

## 2022-12-08 DIAGNOSIS — R1011 Right upper quadrant pain: Secondary | ICD-10-CM | POA: Insufficient documentation

## 2022-12-08 DIAGNOSIS — R1031 Right lower quadrant pain: Secondary | ICD-10-CM | POA: Diagnosis not present

## 2022-12-08 DIAGNOSIS — R1033 Periumbilical pain: Secondary | ICD-10-CM | POA: Insufficient documentation

## 2022-12-08 DIAGNOSIS — R000 Tachycardia, unspecified: Secondary | ICD-10-CM | POA: Diagnosis not present

## 2022-12-08 LAB — URINALYSIS, MICROSCOPIC (REFLEX)

## 2022-12-08 LAB — COMPREHENSIVE METABOLIC PANEL
ALT: 19 U/L (ref 0–44)
AST: 19 U/L (ref 15–41)
Albumin: 4.2 g/dL (ref 3.5–5.0)
Alkaline Phosphatase: 45 U/L (ref 38–126)
Anion gap: 9 (ref 5–15)
BUN: 6 mg/dL (ref 6–20)
CO2: 23 mmol/L (ref 22–32)
Calcium: 9.4 mg/dL (ref 8.9–10.3)
Chloride: 102 mmol/L (ref 98–111)
Creatinine, Ser: 0.72 mg/dL (ref 0.44–1.00)
GFR, Estimated: >60 mL/min (ref >60)
Glucose, Bld: 174 mg/dL — ABNORMAL HIGH (ref 70–99)
Potassium: 3.9 mmol/L (ref 3.5–5.1)
Sodium: 134 mmol/L — ABNORMAL LOW (ref 135–145)
Total Bilirubin: 0.6 mg/dL (ref 0.3–1.2)
Total Protein: 7 g/dL (ref 6.5–8.1)

## 2022-12-08 LAB — CBC
HCT: 38.4 % (ref 36.0–46.0)
Hemoglobin: 13.5 g/dL (ref 12.0–15.0)
MCH: 32.1 pg (ref 26.0–34.0)
MCHC: 35.2 g/dL (ref 30.0–36.0)
MCV: 91.2 fL (ref 80.0–100.0)
Platelets: 269 10*3/uL (ref 150–400)
RBC: 4.21 MIL/uL (ref 3.87–5.11)
RDW: 12.4 % (ref 11.5–15.5)
WBC: 6 10*3/uL (ref 4.0–10.5)
nRBC: 0 % (ref 0.0–0.2)

## 2022-12-08 LAB — I-STAT BETA HCG BLOOD, ED (MC, WL, AP ONLY): I-stat hCG, quantitative: 9 m[IU]/mL — ABNORMAL HIGH (ref <5)

## 2022-12-08 LAB — URINALYSIS, ROUTINE W REFLEX MICROSCOPIC
Bilirubin Urine: NEGATIVE
Glucose, UA: NEGATIVE mg/dL
Ketones, ur: NEGATIVE mg/dL
Leukocytes,Ua: NEGATIVE
Nitrite: NEGATIVE
Protein, ur: NEGATIVE mg/dL
Specific Gravity, Urine: <1.005 — ABNORMAL LOW (ref 1.005–1.030)
pH: 6.5 (ref 5.0–8.0)

## 2022-12-08 LAB — LIPASE, BLOOD: Lipase: 31 U/L (ref 11–51)

## 2022-12-08 MED ORDER — IOHEXOL 350 MG/ML SOLN
75.0000 mL | Freq: Once | INTRAVENOUS | Status: AC | PRN
  Administered 2022-12-08: 75 mL via INTRAVENOUS

## 2022-12-08 MED ORDER — LACTATED RINGERS IV BOLUS
1000.0000 mL | Freq: Once | INTRAVENOUS | Status: AC
  Administered 2022-12-08: 1000 mL via INTRAVENOUS

## 2022-12-08 NOTE — Discharge Instructions (Addendum)
You were evaluated in the emergency department, we did a CT scan of your abdomen as well as an ultrasound which showed no acute findings.  I have included the results of the read below.  Please follow-up with your PCP in 1 week and your OB/GYN to discuss this fibroid.  We do not think this is the cause of your abdominal pain at this time.  I have put a number for gastroenterologist which you have seen before, please call them to follow-up as well.  You may try Pepcid (famotidine) to help reflux, you may take 20 mg twice daily.  Please return to the ED if you have worsening pain in your right lower quadrant or intractable nausea and vomiting.

## 2022-12-08 NOTE — ED Triage Notes (Signed)
Pt c/o RLQ that has migrated to her belly button. Pt states she recently started verapamil and was concerned that caused her pain . Pt went to UC and was told to come to rule out appendicitis  Pt states she was belching a lot last night- took Gas X , Tums, IB guard with some relief of belching but pain remained.

## 2022-12-08 NOTE — ED Provider Notes (Signed)
[facility] Provider Note   CSN: [id number] Arrival date & time: 12/08/22  1310     History  Chief Complaint  Patient presents with   Abdominal Pain    Mary Wyatt is a 37 y.o. female.  This is a 37 year old female with history of migraine presenting to the ED for abdominal pain.  Patient states last night she start experiencing right lower quadrant abdominal pain which radiates to the mid abdominal lines/umbilicus.  She endorses some belching and reflux symptoms without nausea or vomiting.  Her pain has persisted to today in which she was seen at urgent care and they were concerned for appendicitis and sent her to the emergency department.  She denies any chest pain or shortness of breath, no diarrhea, no fevers or chills.  She denies any vaginal bleeding or discharge, no pelvic pain.        Home Medications Prior to Admission medications   Medication Sig Start Date End Date Taking? Authorizing Provider  ALPRAZolam (XANAX) 0.25 MG tablet Take 0.25 mg by mouth as needed. Patient not taking: Reported on 03/19/2022    [provider]  ARIPiprazole (ABILIFY) 2 MG tablet Take 1 mg by mouth daily.    [provider]  busPIRone (BUSPAR) 10 MG tablet Take 10 mg by mouth daily. 08/31/21   [provider]  busPIRone (BUSPAR) 10 MG tablet Take 1 tablet by mouth 2 (two) times daily. 10/18/21   [provider]  Cholecalciferol 125 MCG (5000 UT) capsule Take 5,000 Units by mouth 2 (two) times a week.    [provider]  ferrous sulfate 325 (65 FE) MG tablet Take 325 mg by mouth daily.    [provider]  hydrocortisone (ANUSOL-HC) 2.5 % rectal cream 2 (two) times daily as needed. 05/29/21   [provider]  hydrocortisone (ANUSOL-HC) 25 MG suppository Place 25 mg rectally as needed for hemorrhoids or anal itching.    [provider]  levonorgestrel (MIRENA, 52 MG,) 20  MCG/DAY IUD Mirena 20 mcg/24 hours (8 yrs) 52 mg intrauterine device  Provided By [place]. 08/30/21   [provider]  Multiple Vitamin (MULTIVITAMIN) tablet Take 1 tablet by mouth daily. Women's MVI- Take one daily    [provider]  nitrofurantoin, macrocrystal-monohydrate, (MACROBID) 100 MG capsule Take 100 mg by mouth 2 (two) times daily. Take twice daily for 7 days    [provider]  sodium chloride 1 g tablet Take 1 g by mouth daily.    [provider]      Allergies    Diflucan [fluconazole] and Gluten meal    Review of Systems   Review of Systems  Constitutional:  Negative for chills and fatigue.  Respiratory:  Negative for cough and shortness of breath.   Cardiovascular:  Negative for chest pain and palpitations.  Gastrointestinal:  Positive for abdominal pain.  Genitourinary:  Negative for flank pain.  Neurological:  Positive for headaches.    Physical Exam Updated Vital Signs BP 137/72 (BP Location: Left Arm)   Pulse (!) 109   Temp 98.1 F (36.7 C) (Oral)   Resp 16   Ht 5' 5"$  (1.651 m)   Wt 72.6 kg   LMP 10/28/2022 Comment: neg preg at [facility] 12/08/22  SpO2 100%   BMI 26.63 kg/m  Physical Exam Vitals and nursing note reviewed.  Constitutional:      General: She is not in acute distress.  Appearance: She is well-developed.  HENT:     Head: Normocephalic and atraumatic.  Eyes:     Conjunctiva/sclera: Conjunctivae normal.  Cardiovascular:     Rate and Rhythm: Normal rate and regular rhythm.     Heart sounds: No murmur heard. Pulmonary:     Effort: Pulmonary effort is normal. No respiratory distress.     Breath sounds: Normal breath sounds.  Abdominal:     Palpations: Abdomen is soft.     Tenderness: There is abdominal tenderness in the right upper quadrant, right lower quadrant and periumbilical area. There is no guarding. Negative signs include Murphy's sign and Rovsing's sign.  Musculoskeletal:        General: No  swelling.     Cervical back: Neck supple.  Skin:    General: Skin is warm and dry.     Capillary Refill: Capillary refill takes less than 2 seconds.  Neurological:     Mental Status: She is alert.  Psychiatric:        Mood and Affect: Mood normal.     ED Results / Procedures / Treatments   Labs (all labs ordered are listed, but only abnormal results are displayed) Labs Reviewed  COMPREHENSIVE METABOLIC PANEL - Abnormal; Notable for the following components:      Result Value   Sodium 134 (*)    Glucose, Bld 174 (*)    All other components within normal limits  I-STAT BETA HCG BLOOD, ED ([facility], [facility], [facility] ONLY) - Abnormal; Notable for the following components:   I-stat hCG, quantitative 9.0 (*)    All other components within normal limits  LIPASE, BLOOD  CBC  URINALYSIS, ROUTINE W REFLEX MICROSCOPIC    EKG None  Radiology No results found.  Procedures Procedures    Medications Ordered in ED Medications - No data to display  ED Course/ Medical Decision Making/ A&P                             Medical Decision Making Patient presents with right lower quadrant abdominal pain, she is overall well-appearing and in no acute distress.  She does have mild tachycardia and was sent in for appendicitis evaluation.  Initial labs ordered in triage.  After my evaluation I added on CT abdomen and pelvis for further workup.  She does have a mildly elevated hCG of 9, discussed with patient risks and benefits if this is an early pregnancy.  Patient states she is not sexually active and is not currently trying to become pregnant and would like a CT scan.  1 L LR ordered for fluid resuscitation.  Differential diagnosis includes appendicitis, gastroenteritis, ovarian torsion, PID, gastritis, cholecystitis  I have low suspicion for PID as patient states she is not currently sexually active, does not have history of STI, no vaginal discharge suprapubic tenderness.  She states her pain was not  sudden onset, it was gradual, described more as an ache in the right upper quadrant greater than right lower quadrant, her exam is benign, I have low suspicion for acute ovarian torsion at this time. She also has been afebrile and her vitals are stable.  I personally reviewed and interpreted patient's labs which showed normal liver enzymes, mild hyponatremia and hyperglycemia, otherwise normal CMP.  CBC shows no leukocytosis.  Urinalysis negative for urinary tract infection.  hCG 9, this number is nondiagnostic.  Risk-benefit discussion per above, will have patient follow-up PCP outpatient to address this.  I personally reviewed and interpreted patient's imaging and agree with radiology, patient has no evidence of appendicitis, there is some stones or sludge in her gallbladder but no obvious gallbladder wall thickening or ductal dilation.  Will obtain right upper quadrant ultrasound given most of her tenderness is located in the right upper quadrant.  Patient also had fullness along the right uterine body, she is not tender in this area and I have a low degree of suspicion for ovarian torsion based on her clinical presentation.  I personally reviewed her upper quadrant ultrasound which shows no cholecystic fluid with a normal gallbladder wall and no evidence of dilation of ducts, low suspicion for acute cholecystitis.  On reevaluation patient is well-appearing, mild abdominal pain however improved.  She has no rebound or guarding, she is afebrile and her vitals are stable.  I discussed with patient it could be secondary to reflux versus gastritis, she also has this fibroid noted on CT which she states that she has had for 5 years and knows about.  I recommended she follow-up with her OB/GYN for that as well as gastroenterology.  Return precautions given if she has worsening right lower quadrant pain or intractable nausea and vomiting.  Recommended symptomatic control with Pepcid 20 mg twice daily.  Patient  was stable at discharge.  Problems Addressed: Right upper quadrant abdominal pain: acute illness or injury that poses a threat to life or bodily functions  Amount and/or Complexity of Data Reviewed Labs: ordered. Decision-making details documented in ED Course. Radiology: ordered and independent interpretation performed. Decision-making details documented in ED Course.  Risk Prescription drug management.          Final Clinical Impression(s) / ED Diagnoses Final diagnoses:  None    Rx / DC Orders ED Discharge Orders     None         Jimmie Molly, MD 12/08/22 1735    Blanchie Dessert, MD 12/09/22 [phone]

## 2022-12-08 NOTE — ED Provider Notes (Incomplete)
Patient is a pleasant 37 year old female presenting today with right-sided abdominal pain.  Started yesterday and has been persistent.  No additional symptoms associated with it.  Patient does have a prior history of dysfunctional gallbladder 10 years ago but reports it eventually ended up improving.  She does have a history of celiac disease but is very strict with her diet.  On exam patient has mid right abdominal pain as well as right upper quadrant abdominal pain.  Concern for cholelithiasis versus hepatitis versus appendicitis.  Low suspicion for pelvic etiology.  CT of the abdomen pelvis showed gallbladder sludge or stones but no other acute findings.  Appendix was normal.  Patient's labs without acute findings.  Ultrasound for further evaluation showed

## 2022-12-08 NOTE — ED Provider Triage Note (Signed)
Emergency Medicine Provider Triage Evaluation Note  Mary Wyatt , a 37 y.o. female  was evaluated in triage.  Pt complains of abdominal pain. Originally started in her RLQ, has now moved to her belly button. Recently put on verapamil. Went to UC, sent here to r/o appendicitis. Had head CT performed with [facility] today, h/x of craniectomy. States she was belching last night, has tried Gas-X, TUMs, and IB guard.   Review of Systems  Positive: Abd pain, belching Negative: Nausea, vomiting, diarrhea, fever  Physical Exam  BP 137/72 (BP Location: Left Arm)   Pulse (!) 109   Temp 98.1 F (36.7 C) (Oral)   Resp 16   Ht 5' 5"$  (1.651 m)   Wt 72.6 kg   LMP 10/28/2022 Comment: neg preg at [facility] 12/08/22  SpO2 100%   BMI 26.63 kg/m  Gen:   Awake, no distress   Resp:  Normal effort  MSK:   Moves extremities without difficulty  Other:    Medical Decision Making  Medically screening exam initiated at 1:24 PM.  Appropriate orders placed.  Mary Wyatt was informed that the remainder of the evaluation will be completed by another provider, this initial triage assessment does not replace that evaluation, and the importance of remaining in the ED until their evaluation is complete.  Workup initiated   Rodolfo Gaster T, PA-C 12/08/22 1330

## 2023-08-04 ENCOUNTER — Telehealth (HOSPITAL_COMMUNITY): Payer: Self-pay | Admitting: Psychiatry

## 2023-08-04 ENCOUNTER — Telehealth (HOSPITAL_COMMUNITY): Payer: Self-pay

## 2023-08-04 NOTE — Telephone Encounter (Signed)
DMorrie Sheldon, from Dr. Arlana Pouch office [facility])) called to refer pt on behalf of Dr. Gaspar Skeeters.  A:  Placed call to orient pt and answer her questions.  According to pt, her neurologist states she can't be on any medications.  Pt states she is interested in TMS again.  "I've been trying for a couple of years because [employer] initially turned me down but then later approved it but time  ran out and I couldn't complete it."  Pt states she will think about if she would prefer MH-IOP vs. TMS.  "I am leaning more towards TMS since I've been trying to get it done."  Discussed TMS with patient and told pt that case manager would reach out to Morrie Sheldon ([facility]) and mention that she is interested.  Inform Dr. Marchelle Gearing nurse Morrie Sheldon). Inform Tessa and team.  R:  Pt receptive.

## 2023-08-04 NOTE — Telephone Encounter (Signed)
[employer] Coordinator placed call to patient after receiving referral from MH-IOP case manager. Coordinator conducted phone screening and PHQ-9 assessment; pt scored an 18. Coordinator then asked pt for consent to email a BDI assessment for additional documentation, patient consented to emailed assessment. Coordinator then requested patient to contact their insurance (pt currently on COBRA plan, expires 10/20/23 - wants to start TMS before expiration d/t financial cost) and request a verification of benefits. Once that is completed, Coordinator will then schedule TMS consultation. Patient agreed and will return call with verification.

## 2023-08-04 NOTE — Telephone Encounter (Signed)
Coordinator sent outgoing email containing BDI assessment. Pt will complete and send back for scoring.

## 2023-08-06 ENCOUNTER — Telehealth (HOSPITAL_COMMUNITY): Payer: Self-pay

## 2023-08-06 NOTE — Telephone Encounter (Signed)
TMS C: Received incoming email from patient with their completed BDI assessment. Coordinator relayed assessment scores and additional info (recent dx of RCVS) to TMS provider for confirmation of eligibility. TMS provider informed Coordinator of patient ineligibility for treatment d/t contraindications of recent dx (RCVS) and metal plate on patient's cranium.   Coordinator sent an outgoing email to inform patient of ineligibility. Coordinator informed patient of reasoning why and stated they would be more than happy to compile a list of resources/other providers offering other forms of treatment (ECT, EMDR, [place], etc) but recommended pt pursue MH-IOP in the meantime. Coordinator will await pt response but inform referring IOP case manager [place]) of ineligibility.

## 2023-08-06 NOTE — Telephone Encounter (Signed)
TMS C: Sent outgoing email to patient in response to their incoming. Coordinator advised pt to confirm material and specific location of cranial plate, as some flexibility could be given as long as material is not not metal or ferromagnetic, and location would not be under tx coil. Coordinator also advised for pt to check-in with MH-IOP case manager to confirm if active medication is required for joining program. Coordinator concluded response by agreeing to compile list of resources, but asked for patience if not able to be sent by end of week.

## 2023-08-06 NOTE — Telephone Encounter (Signed)
TMS C: Received incoming email from pt confirming their understanding of ineligibility for TMS treatment. Pt alerted Coordinator that they were waiting on a call back from their Neurosurgeon's office to confirm material of cranial plate. Pt stated they wouldn't want their RCVS to reoccur and that they had seen a study where TMS caused an episode. Pt confirmed their dx had remised once they stopped CBD/THC use (confirmed via MRI). Pt then said they feel "disheartened and like they are running out of options" but would appreciate additional resources to be sent. Pt unsure if they will pursue MH-IOP d/t limits in medications they can take (no serotonergic rx).

## 2023-09-03 ENCOUNTER — Encounter: Payer: Self-pay | Admitting: Cardiovascular Disease

## 2023-09-03 NOTE — Progress Notes (Signed)
Cardiology Office Note:  .   Date:  09/04/2023  ID:  Mary Wyatt, DOB 1985-12-06, MRN [id number] PCP: Gwenlyn Found, MD  [facility] [facility] Providers Cardiologist:  Elease Hashimoto  Berton Mount, MD  Click to update primary MD,subspecialty MD or APP then REFRESH:1}   History of Present Illness: .   Mary Wyatt is a 37 y.o. female  who I saw many years ago  She was found to have an incidental finding of an atrial septal aneurism   She has a hx of POTS and has been seen by Dr. Graciela Husbands I  saw her 11 years ago , Echo showed an incidental atrial septum aneurism   She discussed bubble study at previous cardiology office visit   Saw Dr. Graciela Husbands last year and was doing better with adding additional water and salt   Still having some orthostatic hypotension Vestibular PT did not help with her symptoms  Has seen cardiology at [facility]  Had an echo and 3 day monitor   The echo showed normal LV function  Trivial MR  No specific dx of atrial septal  but she did have a + bubble study with valsalva   Event monitor did not show any abnormalities   Walks regularly after dinner and on weekends  Perhaps 3-4 miles on the weekends  No yard work or garden work   Thinks she eats enough protein .   Drinks water but not much electrolytes   Breakfast Greek yogurt and coffee  Lunch - chick pea salad  Dinner - meat and veggie          ROS:    Studies Reviewed: Marland Kitchen        EKG Interpretation Date/Time:  Friday September 04 2023 13:15:20 EST Ventricular Rate:  70 PR Interval:  148 QRS Duration:  82 QT Interval:  390 QTC Calculation: 421 R Axis:   24  Text Interpretation: Normal sinus rhythm with sinus arrhythmia Cannot rule out Inferior infarct , age undetermined Cannot rule out Anterior infarct , age undetermined No previous ECGs available Confirmed by Kristeen Miss [phone]) on 09/04/2023 5:57:26 PM   Risk Assessment/Calculations:             Physical Exam:   VS:  BP  110/74   Pulse 92   Ht 5\' 5"  (1.651 m)   Wt 169 lb (76.7 kg)   SpO2 99%   BMI 28.12 kg/m    Wt Readings from Last 3 Encounters:  09/04/23 169 lb (76.7 kg)  12/08/22 160 lb (72.6 kg)  11/28/22 154 lb 8.7 oz (70.1 kg)    GEN: Well nourished, well developed in no acute distress NECK: No JVD; No carotid bruits CARDIAC:  RR , very soft systolic murmur  RESPIRATORY:  Clear to auscultation without rales, wheezing or rhonchi  ABDOMEN: Soft, non-tender, non-distended EXTREMITIES:  No edema; No deformity   ASSESSMENT AND PLAN: .   1.  Orthostatic hypotension: Laurin  continues to have episodes of orthostatic hypotension.  I encouraged her to eat more protein with each meal.  She needs to drink electrolyte infused water.  We discussed making homemade liquid IV consisting of  1/4 teaspoon of Morton's  lite salt. Mio flavoring   Also recommended starting her day with a glass of V8 juice.  I will see her back in 3 to 4 months for follow-up visit.  2.  Incidental finding of atrial septal aneurysm: She had a very mobile atrial septum seen by echo 11  years ago.  We called that in atrial septal aneurysm.  She had a repeat echo performed this year which did not specifically mention an atrial septal aneurysm but did notice a positive bubble study suggesting that the atrial septum was in the fenestrated.  This is also consistent with an atrial septal aneurysm.  After long explanation about the fact that this is a fairly benign situation.  She has not had any stroke symptoms or TIA symptoms.  She is not on supplemental estrogen.  She has never had any history of hypercoagulable issues.           Dispo: 3 months    Signed, Kristeen Miss, MD

## 2023-09-04 ENCOUNTER — Encounter: Payer: Self-pay | Admitting: Cardiovascular Disease

## 2023-09-04 ENCOUNTER — Ambulatory Visit: Payer: BC Managed Care – PPO | Attending: Cardiovascular Disease | Admitting: Cardiovascular Disease

## 2023-09-04 VITALS — BP 110/74 | HR 92 | Ht 65.0 in | Wt 169.0 lb

## 2023-09-04 DIAGNOSIS — I951 Orthostatic hypotension: Secondary | ICD-10-CM

## 2023-09-04 NOTE — Patient Instructions (Signed)
Medication Instructions:   Your physician recommends that you continue on your current medications as directed. Please refer to the Current Medication list given to you today.  *If you need a refill on your cardiac medications before your next appointment, please call your pharmacy*     Follow-Up:  3-4 MONTHS WITH DR. Elease Hashimoto

## 2023-09-10 ENCOUNTER — Ambulatory Visit (INDEPENDENT_AMBULATORY_CARE_PROVIDER_SITE_OTHER): Payer: BC Managed Care – PPO | Admitting: Radiology

## 2023-09-10 ENCOUNTER — Encounter: Payer: Self-pay | Admitting: Radiology

## 2023-09-10 VITALS — BP 120/72 | Ht 65.0 in | Wt 169.0 lb

## 2023-09-10 DIAGNOSIS — N952 Postmenopausal atrophic vaginitis: Secondary | ICD-10-CM | POA: Diagnosis not present

## 2023-09-10 DIAGNOSIS — Z30430 Encounter for insertion of intrauterine contraceptive device: Secondary | ICD-10-CM

## 2023-09-10 DIAGNOSIS — Z30018 Encounter for initial prescription of other contraceptives: Secondary | ICD-10-CM

## 2023-09-10 DIAGNOSIS — R635 Abnormal weight gain: Secondary | ICD-10-CM

## 2023-09-10 DIAGNOSIS — N9489 Other specified conditions associated with female genital organs and menstrual cycle: Secondary | ICD-10-CM | POA: Diagnosis not present

## 2023-09-10 LAB — WET PREP FOR TRICH, YEAST, CLUE

## 2023-09-10 MED ORDER — ESTRADIOL 0.1 MG/GM VA CREA: 1.0000 g | TOPICAL_CREAM | VAGINAL | 12 refills | Status: DC

## 2023-09-10 MED ORDER — MISOPROSTOL 200 MCG PO TABS: 400.0000 ug | ORAL_TABLET | Freq: Once | ORAL | 0 refills | Status: DC

## 2023-09-10 MED ORDER — PHEXXI 1.8-1-0.4 % VA GEL: 5.0000 g | VAGINAL | 11 refills | Status: DC

## 2023-09-10 MED ORDER — DIAZEPAM 5 MG PO TABS: 5.0000 mg | ORAL_TABLET | Freq: Once | ORAL | 0 refills | Status: AC

## 2023-09-10 NOTE — Progress Notes (Signed)
   Mary Wyatt 08/13/1986 [id number]   History:  37 y.o. G0 presents as a new patient with multiple concerns. Vaginal burning after ovulation until cycle. Multiple recurrences of BV, IUD was removed in April and symptoms improved slightly, however, PMDD and endometriosis symptoms have worsened since removal. Decreased sensation in clitoris, orgasms are not as strong. Is currently in PT for low back pain.  Increased weight gain- about 40lbs in the past year with no change in diet or exercise. Family hx of thyroid dysfunction.  Gynecologic History Patient's last menstrual period was 08/26/2023 (exact date). Period Cycle (Days):  (24-27) Period Duration (Days): 7 Period Pattern: (!) Irregular Menstrual Flow: Moderate, Light, Heavy Menstrual Control: Thin pad, Maxi pad, Tampon Dysmenorrhea: (!) Severe Dysmenorrhea Symptoms: Cramping Contraception/Family planning: coitus interruptus Sexually active: yes   Obstetric History OB History  Gravida Para Term Preterm AB Living  0 0 0 0 0 0  SAB IAB Ectopic Multiple Live Births  0 0 0 0 0    The following portions of the patient's history were reviewed and updated as appropriate: allergies, current medications, past family history, past medical history, past social history, past surgical history, and problem list.  Review of Systems  All other systems reviewed and are negative.   Past medical history, past surgical history, family history and social history were all reviewed and documented in the EPIC chart.  Exam:  Vitals:   09/10/23 1032  BP: 120/72  Weight: 169 lb (76.7 kg)  Height: 5\' 5"  (1.651 m)   Body mass index is 28.12 kg/m.  Physical Exam Vitals and nursing note reviewed. Exam conducted with a chaperone present.  Pulmonary:     Effort: Pulmonary effort is normal.  Genitourinary:    General: Normal vulva.     Vagina: Vaginal discharge and erythema present.     Cervix: Normal.     Uterus: Normal.       Comments: Vaginal slightly atrophic Psychiatric:        Mood and Affect: Mood normal.        Thought Content: Thought content normal.        Judgment: Judgment normal.    Raynelle Fanning, [name] present for exam  Microscopic wet-mount exam shows negative for pathogens, normal epithelial cells.   Assessment/Plan:   1. Vaginal burning - WET PREP FOR TRICH, YEAST, CLUE - Mycoplasma / ureaplasma culture - estradiol (ESTRACE VAGINAL) 0.1 MG/GM vaginal cream; Place 1 g vaginally 3 (three) times a week.  Dispense: 42.5 g; Refill: 12  2. Encounter for initial prescription of other contraceptives Will start phexxi for contraception and to lower ph Interested in IUD again to manage her endometriosis and PMDD - Lactic Ac-Citric Ac-Pot Bitart (PHEXXI) 1.8-1-0.4 % GEL; Place 5 g vaginally as directed. Before intercourse  Dispense: 120 g; Refill: 11   3. Weight gain - Thyroid Panel With TSH  4. Encounter for insertion of Mirena IUD - IUD Insertion; Future -Ibuprofen 800mg  2 hours before insertion - Tylenol 1gram at time of appointment - Plan for a driver  - misoprostol (CYTOTEC) 200 MCG tablet; Place 2 tablets (400 mcg total) vaginally once for 1 dose. The night before IUD insertion  Dispense: 2 tablet; Refill: 0 - diazepam (VALIUM) 5 MG tablet; Take 1 tablet (5 mg total) by mouth once for 1 dose. 2 hours before procedure  Dispense: 1 tablet; Refill: 0    Return for 2-3 weeks IUD insertion.  Arlie Solomons B WHNP-BC 11:07 AM 09/10/2023

## 2023-09-11 LAB — THYROID PANEL WITH TSH
Free Thyroxine Index: 2.2 (ref 1.4–3.8)
T3 Uptake: 28 % (ref 22–35)
T4, Total: 7.7 ug/dL (ref 5.1–11.9)
TSH: 2.5 m[IU]/L

## 2023-09-17 LAB — MYCOPLASMA / UREAPLASMA CULTURE

## 2023-10-07 ENCOUNTER — Ambulatory Visit: Payer: BC Managed Care – PPO | Admitting: Radiology

## 2023-10-20 NOTE — Telephone Encounter (Signed)
May try fluconazole x 2, if symptom persist OV

## 2023-10-23 ENCOUNTER — Encounter: Payer: Self-pay | Admitting: Obstetrics and Gynecology

## 2023-10-23 ENCOUNTER — Ambulatory Visit: Payer: Medicaid Other | Admitting: Obstetrics and Gynecology

## 2023-10-23 VITALS — BP 98/64 | HR 85 | Wt 168.0 lb

## 2023-10-23 DIAGNOSIS — N898 Other specified noninflammatory disorders of vagina: Secondary | ICD-10-CM

## 2023-10-23 DIAGNOSIS — N760 Acute vaginitis: Secondary | ICD-10-CM | POA: Diagnosis not present

## 2023-10-23 DIAGNOSIS — B9689 Other specified bacterial agents as the cause of diseases classified elsewhere: Secondary | ICD-10-CM

## 2023-10-23 LAB — WET PREP FOR TRICH, YEAST, CLUE

## 2023-10-23 MED ORDER — METRONIDAZOLE 0.75 % VA GEL: 1.0000 | Freq: Every day | VAGINAL | 0 refills | Status: AC

## 2023-10-23 NOTE — Progress Notes (Signed)
 38 y.o. G0P0000 female with history of endometriosis here for vaginal irritation.  Patient c/o of vaginal burning.  She was on antibiotics for 2 weeks due to small bowel infection.  States she had vaginal itching but treated it with terconazole  cream. The itching is no longer there but its burning. White discharge. A little vaginal odor.   Reports a longstanding history of BV.  Patient's last menstrual period was 10/17/2023 (exact date).   Birth control: phexxi  Sexually active: yes   GYN HISTORY: No significant history  OB History  Gravida Para Term Preterm AB Living  0 0 0 0 0 0  SAB IAB Ectopic Multiple Live Births  0 0 0 0 0    Past Medical History:  Diagnosis Date   Abdominal pain    Abnormal EKG    Dec 2019 and Jan 2020/ saw cardiologist   Allergy    Altered bowel function    Anemia    Anxiety    Biliary dyskinesia    Celiac disease    Chest tightness    Depression    Dermatitis    Deviated septum    DOE (dyspnea on exertion)    Dysmenorrhea    Endometriosis    Episodic lightheadedness    ETD (Eustachian tube dysfunction), bilateral    Family history of brain aneurysm    Genital warts    GERD (gastroesophageal reflux disease)    History of meningioma    Hypotension    Insomnia    Interstitial cystitis    Meningioma (HCC)    Mononucleosis    Pelvic pain    Perineal pain    Post-operative nausea and vomiting    Rhinitis    RMSF [facility] spotted fever) 2015   S/P resection of meningioma    Seasonal allergic rhinitis    Tendinitis    Ulcer    Unspecified dyspareunia    Urethral syndrome    Vitamin D  deficiency     Past Surgical History:  Procedure Laterality Date   COLONOSCOPY  2013   CRANIOTOMY  09/2018   laproscopic surgery     2013 and 2019   OVARIAN CYST REMOVAL     TONSILLECTOMY     UPPER GASTROINTESTINAL ENDOSCOPY  2013    Current Outpatient Medications on File Prior to Visit  Medication Sig Dispense Refill    ALPRAZolam  (XANAX ) 0.25 MG tablet Take 0.25 mg by mouth as needed.     Alum Hydroxide-Mag Carbonate 508-475 MG/10ML SUSP Take 1 tablet by mouth as needed.     Cholecalciferol 125 MCG (5000 UT) capsule Take 5,000 Units by mouth 2 (two) times a week.     Cyanocobalamin  1000 MCG SUBL Place 1 tablet under the tongue daily.     cyclobenzaprine  (FLEXERIL ) 10 MG tablet Take 10 mg by mouth 3 (three) times daily as needed.     cyclobenzaprine  (FLEXERIL ) 5 MG tablet Take 5 mg by mouth at bedtime.     estradiol  (ESTRACE  VAGINAL) 0.1 MG/GM vaginal cream Place 1 g vaginally 3 (three) times a week. 42.5 g 12   ibuprofen (ADVIL) 600 MG tablet Take 600 mg by mouth every 6 (six) hours as needed.     Multiple Vitamin (MULTIVITAMIN) tablet Take 1 tablet by mouth daily. Women's MVI- Take one daily     RABEprazole (ACIPHEX) 20 MG tablet Take 20 mg by mouth daily.     misoprostol  (CYTOTEC ) 200 MCG tablet Place 2 tablets (400 mcg total) vaginally once for  1 dose. The night before IUD insertion 2 tablet 0   No current facility-administered medications on file prior to visit.    Allergies  Allergen Reactions   Diflucan [Fluconazole] Hives   Gluten Meal Other (See Comments) and Nausea And Vomiting    Celiac       PE Today's Vitals   10/23/23 0901  BP: 98/64  Pulse: 85  SpO2: 98%  Weight: 168 lb (76.2 kg)   Body mass index is 27.96 kg/m.  Physical Exam Vitals reviewed. Exam conducted with a chaperone present.  Constitutional:      General: She is not in acute distress.    Appearance: Normal appearance.  HENT:     Head: Normocephalic and atraumatic.     Nose: Nose normal.  Eyes:     Extraocular Movements: Extraocular movements intact.     Conjunctiva/sclera: Conjunctivae normal.  Pulmonary:     Effort: Pulmonary effort is normal.  Genitourinary:    General: Normal vulva.     Exam position: Lithotomy position.     Vagina: Vaginal discharge present.     Cervix: Normal. No cervical motion  tenderness, discharge or lesion.     Uterus: Normal. Not enlarged and not tender.      Adnexa: Right adnexa normal and left adnexa normal.  Musculoskeletal:        General: Normal range of motion.     Cervical back: Normal range of motion.  Neurological:     General: No focal deficit present.     Mental Status: She is alert.  Psychiatric:        Mood and Affect: Mood normal.        Behavior: Behavior normal.       Assessment and Plan:        BV (bacterial vaginosis) -     metroNIDAZOLE ; Place 1 Applicatorful vaginally at bedtime for 5 days.  Dispense: 50 g; Refill: 0  Vaginal discharge -     WET PREP FOR TRICH, YEAST, CLUE  Requested paper Rx.  Vera LULLA Pa, MD

## 2023-10-28 NOTE — Telephone Encounter (Signed)
 Spoke with patient. Patient reports internal vaginal burning, symptoms worse than prior to starting medication. Reviewed option of OV now or waiting to f/u at AEX. Patient request to come in this week, she is aware retesting not recommended for 3 days after, would feel more comfortable having Jami take a look.   OV scheduled for 1/9 at 1115. Will keep AEX as scheduled for now.  Routing to provider for final review. Patient is agreeable to disposition. Will close encounter.

## 2023-10-28 NOTE — Telephone Encounter (Signed)
 3 days after last use of meds preferably

## 2023-10-29 ENCOUNTER — Ambulatory Visit: Payer: Medicaid Other | Admitting: Radiology

## 2023-10-29 VITALS — BP 120/82 | HR 112

## 2023-10-29 DIAGNOSIS — N761 Subacute and chronic vaginitis: Secondary | ICD-10-CM

## 2023-10-29 LAB — WET PREP FOR TRICH, YEAST, CLUE

## 2023-10-29 MED ORDER — TERCONAZOLE 0.8 % VA CREA: 1.0000 | TOPICAL_CREAM | Freq: Every day | VAGINAL | 0 refills | Status: DC

## 2023-10-29 NOTE — Progress Notes (Signed)
      Subjective: Mary Wyatt is a 38 y.o. female who complains of vaginal burning after using metrogel  x 5 days. Was diagnosed with BV again 10/23/23. Recently treated for intestinal infection with long course antibiotics. Since then she feels her gut and vaginal health have been affected.     Review of Systems  All other systems reviewed and are negative.   Past Medical History:  Diagnosis Date   Abdominal pain    Abnormal EKG    Dec 2019 and Jan 2020/ saw cardiologist   Allergy    Altered bowel function    Anemia    Anxiety    Biliary dyskinesia    Celiac disease    Chest tightness    Depression    Dermatitis    Deviated septum    DOE (dyspnea on exertion)    Dysmenorrhea    Endometriosis    Episodic lightheadedness    ETD (Eustachian tube dysfunction), bilateral    Family history of brain aneurysm    Genital warts    GERD (gastroesophageal reflux disease)    History of meningioma    Hypotension    Insomnia    Interstitial cystitis    Meningioma (HCC)    Mononucleosis    Pelvic pain    Perineal pain    Post-operative nausea and vomiting    Rhinitis    RMSF [facility] spotted fever) 2015   S/P resection of meningioma    Seasonal allergic rhinitis    Tendinitis    Ulcer    Unspecified dyspareunia    Urethral syndrome    Vitamin D  deficiency       Objective:  Today's Vitals   10/29/23 1113  BP: 120/82  Pulse: (!) 112  SpO2: 98%   There is no height or weight on file to calculate BMI.   Physical Exam Vitals and nursing note reviewed. Exam conducted with a chaperone present.  Constitutional:      Appearance: Normal appearance. She is well-developed.  Pulmonary:     Effort: Pulmonary effort is normal.  Abdominal:     General: Abdomen is flat.     Palpations: Abdomen is soft.  Genitourinary:    General: Normal vulva.     Vagina: Vaginal discharge present. No erythema, bleeding or lesions.     Cervix: Normal. No discharge, friability,  lesion or erythema.     Uterus: Normal.      Adnexa: Right adnexa normal and left adnexa normal.  Neurological:     Mental Status: She is alert.  Psychiatric:        Mood and Affect: Mood normal.        Thought Content: Thought content normal.        Judgment: Judgment normal.      Microscopic wet-mount exam shows clue cells.   Darice Hoit, [name] present for exam  Assessment:/Plan:   1. Subacute vaginitis (Primary) - WET PREP FOR TRICH, YEAST, CLUE - terconazole  (TERAZOL 3 ) 0.8 % vaginal cream; Place 1 applicator vaginally at bedtime.  Dispense: 20 g; Refill: 0 - Begin Boric acid weekly for prevention of recurrent infection - Recommend a daily probiotic  Khai Torbert B, NP 11:44 AM

## 2023-11-04 ENCOUNTER — Ambulatory Visit: Payer: BC Managed Care – PPO | Admitting: Radiology

## 2023-11-04 NOTE — Telephone Encounter (Signed)
 Spoke with patient, advised per Jami. Patient is leaving on 1/22, requesting OV prior. Advised I will review schedule with Jamie for 1/21 and f/u. Patient agreeable.   Jami -please review schedule for 1/21, can patient be worked in?

## 2023-11-04 NOTE — Telephone Encounter (Signed)
 Ok to schedule for the 22, if symptoms resolve by them may cancel visit

## 2023-11-04 NOTE — Telephone Encounter (Signed)
 9:45 on 1/21

## 2023-11-10 ENCOUNTER — Encounter: Payer: Self-pay | Admitting: Radiology

## 2023-11-10 ENCOUNTER — Ambulatory Visit: Payer: Medicaid Other | Admitting: Radiology

## 2023-11-10 VITALS — BP 114/78 | HR 79

## 2023-11-10 DIAGNOSIS — N760 Acute vaginitis: Secondary | ICD-10-CM

## 2023-11-10 DIAGNOSIS — B9689 Other specified bacterial agents as the cause of diseases classified elsewhere: Secondary | ICD-10-CM

## 2023-11-10 DIAGNOSIS — N9489 Other specified conditions associated with female genital organs and menstrual cycle: Secondary | ICD-10-CM

## 2023-11-10 LAB — WET PREP FOR TRICH, YEAST, CLUE

## 2023-11-10 MED ORDER — NUVESSA 1.3 % VA GEL: 1.0000 | Freq: Once | VAGINAL | 0 refills | Status: AC

## 2023-11-10 NOTE — Progress Notes (Signed)
      Subjective: Mary Wyatt is a 38 y.o. female c/o vaginal burning, slight itching. Pt was treated for yeast infection 10-29-23 & BV before that on 10-23-23. Has restarted vaginal estrogen and boric acid- last used 3 days ago.   Review of Systems  All other systems reviewed and are negative.   Past Medical History:  Diagnosis Date   Abdominal pain    Abnormal EKG    Dec 2019 and Jan 2020/ saw cardiologist   Allergy    Altered bowel function    Anemia    Anxiety    Biliary dyskinesia    Celiac disease    Chest tightness    Depression    Dermatitis    Deviated septum    DOE (dyspnea on exertion)    Dysmenorrhea    Endometriosis    Episodic lightheadedness    ETD (Eustachian tube dysfunction), bilateral    Family history of brain aneurysm    Genital warts    GERD (gastroesophageal reflux disease)    History of meningioma    Hypotension    Insomnia    Interstitial cystitis    Meningioma (HCC)    Mononucleosis    Pelvic pain    Perineal pain    Post-operative nausea and vomiting    Rhinitis    RMSF [facility] spotted fever) 2015   S/P resection of meningioma    Seasonal allergic rhinitis    Tendinitis    Ulcer    Unspecified dyspareunia    Urethral syndrome    Vitamin D deficiency       Objective:  Today's Vitals   11/10/23 0945  BP: 114/78  Pulse: 79  SpO2: 98%   There is no height or weight on file to calculate BMI.   Physical Exam Vitals and nursing note reviewed. Exam conducted with a chaperone present.  Constitutional:      Appearance: Normal appearance. She is well-developed.  Pulmonary:     Effort: Pulmonary effort is normal.  Abdominal:     General: Abdomen is flat.     Palpations: Abdomen is soft.  Genitourinary:    General: Normal vulva.     Vagina: Vaginal discharge present. No erythema, bleeding or lesions.     Cervix: Normal. No discharge, friability, lesion or erythema.     Uterus: Normal.      Adnexa: Right adnexa  normal and left adnexa normal.  Neurological:     Mental Status: She is alert.  Psychiatric:        Mood and Affect: Mood normal.        Thought Content: Thought content normal.        Judgment: Judgment normal.     Microscopic wet-mount exam shows clue cells.   Raynelle Fanning, [name] present for exam  Assessment:/Plan:  1. Vaginal burning (Primary) - WET PREP FOR TRICH, YEAST, CLUE  2. BV (bacterial vaginosis) - metroNIDAZOLE (NUVESSA) 1.3 % GEL; Place 1 Dose vaginally once for 1 dose.  Dispense: 5 g; Refill: 0   Avoid intercourse until symptoms are resolved. Safe sex encouraged. Avoid the use of soaps or perfumed products in the peri area. Avoid tub baths and sitting in sweaty or wet clothing for prolonged periods of time.    Carrolyn Hilmes B, NP 10:14 AM

## 2023-11-27 ENCOUNTER — Ambulatory Visit: Payer: BC Managed Care – PPO | Admitting: Radiology

## 2023-12-01 ENCOUNTER — Encounter: Payer: Self-pay | Admitting: Nurse Practitioner

## 2023-12-01 ENCOUNTER — Ambulatory Visit: Payer: Medicaid Other | Admitting: Nurse Practitioner

## 2023-12-01 VITALS — BP 112/70 | HR 91 | Wt 170.0 lb

## 2023-12-01 DIAGNOSIS — N898 Other specified noninflammatory disorders of vagina: Secondary | ICD-10-CM

## 2023-12-01 DIAGNOSIS — B9689 Other specified bacterial agents as the cause of diseases classified elsewhere: Secondary | ICD-10-CM

## 2023-12-01 DIAGNOSIS — N760 Acute vaginitis: Secondary | ICD-10-CM | POA: Diagnosis not present

## 2023-12-01 LAB — WET PREP FOR TRICH, YEAST, CLUE

## 2023-12-01 MED ORDER — NUVESSA 1.3 % VA GEL: 1.0000 | VAGINAL | 0 refills | Status: DC

## 2023-12-01 NOTE — Progress Notes (Signed)
   Acute Office Visit  Subjective:    Patient ID: Mary Wyatt, female    DOB: [birth year], 38 y.o.   MRN: [id number]   HPI 38 y.o. presents today for vaginal discharge, itching, burning x 1 week. Denies odor. Used Dolores Frame 11/22/23 for BV, symptoms did not improve. Completed Z pack last night. Has dealt with recurrent yeast and BV in the past. Taking probiotics. Washes with water only. Has not been sexually active due to vaginal infections.   Patient's last menstrual period was 11/15/2023 (exact date).    Review of Systems  Constitutional: Negative.   Genitourinary:  Positive for vaginal discharge and vaginal pain (Itching, burning).       Objective:    Physical Exam Constitutional:      Appearance: Normal appearance.  Genitourinary:    General: Normal vulva.     Vagina: Vaginal discharge present. No erythema.     Cervix: Normal.     BP 112/70 (BP Location: Left Arm, Patient Position: Sitting, Cuff Size: Normal)   Pulse 91   Wt 170 lb (77.1 kg)   LMP 11/15/2023 (Exact Date)   SpO2 96%   BMI 28.29 kg/m  Wt Readings from Last 3 Encounters:  12/01/23 170 lb (77.1 kg)  10/23/23 168 lb (76.2 kg)  09/10/23 169 lb (76.7 kg)        Patient informed chaperone available to be present for breast and/or pelvic exam. Patient has requested no chaperone to be present. Patient has been advised what will be completed during breast and pelvic exam.   Wet prep + clue cells  Assessment & Plan:   Problem List Items Addressed This Visit   None Visit Diagnoses       Bacterial vaginosis    -  Primary   Relevant Medications   metroNIDAZOLE (NUVESSA) 1.3 % GEL     Vaginal itching       Relevant Orders   WET PREP FOR TRICH, YEAST, CLUE      Plan: Wet prep + clue cells. Options for Flagyl (7-day or 14-day), Metrogel or Nuvessa provided, as well as long-term treatment for recurrent vaginitis with twice weekly Metrogel x 16 weeks. Declines preventative treatment today. Would like  to try [place] again. Recommend boric acid twice weekly after treatment. Continue women's probiotic.   Return if symptoms worsen or fail to improve.    Olivia Mackie DNP, 9:02 AM 12/01/2023

## 2023-12-11 NOTE — Telephone Encounter (Signed)
 Needs OV.

## 2023-12-14 ENCOUNTER — Encounter: Payer: Self-pay | Admitting: Cardiovascular Disease

## 2023-12-14 NOTE — Telephone Encounter (Signed)
 Patient left message, transferred from front office.   Patient is scheduled for AEX 12/18/23. Patient is unsure if burning is related to BV or low estrogen. Asking if she should continue to push out AEX or ok to proceed as scheduled and address issues at that time. Last used vaginal estrogen Sunday night.   Jami -please review and advise.

## 2023-12-14 NOTE — Progress Notes (Unsigned)
 Cardiology Office Note:  .   Date:  12/15/2023  ID:  [name], DOB 1985-11-10, MRN [id number] PCP: Gwenlyn Found, MD  [facility] Providers Cardiologist:  Sincere Berlanga,  Berton Mount, MD  Click to update primary MD,subspecialty MD or APP then REFRESH:1}   History of Present Illness: .   Mary Wyatt is a 38 y.o. female  who I saw many years ago  She was found to have an incidental finding of an atrial septal aneurism   She has a hx of POTS and has been seen by Dr. Graciela Husbands I  saw her 11 years ago , Echo showed an incidental atrial septum aneurism   She discussed bubble study at previous cardiology office visit   Saw Dr. Graciela Husbands last year and was doing better with adding additional water and salt   Still having some orthostatic hypotension Vestibular PT did not help with her symptoms  Has seen cardiology at [facility]  Had an echo and 3 day monitor   The echo showed normal LV function  Trivial MR  No specific dx of atrial septal  but she did have a + bubble study with valsalva   Event monitor did not show any abnormalities   Walks regularly after dinner and on weekends  Perhaps 3-4 miles on the weekends  No yard work or garden work   Thinks she eats enough protein .   Drinks water but not much electrolytes   Breakfast Greek yogurt and coffee  Lunch - chick pea salad  Dinner - meat and veggie    Feb. 25, 2025 [name] is seen for follow up of her orthostatic hypotension  I saw her several months ago - encouraged her to eat more protein, drink more water, add electrolytes   Is drinking more water,  Is eating more protein Still has some orthostatic hypotension  , dizziness going from sitting to standing   Echo : 07/09/23 - normal LV , RV function  No significant valve disease  Incidental PFO with + bubble study    ROS:    Studies Reviewed: .            Risk Assessment/Calculations:           Physical Exam:    Physical Exam: Blood  pressure 98/62, pulse 79, height 5\' 5"  (1.651 m), weight 167 lb 12.8 oz (76.1 kg), last menstrual period 11/15/2023, SpO2 95%.      GEN:  Well nourished, well developed in no acute distress HEENT: Normal NECK: No JVD; No carotid bruits LYMPHATICS: No lymphadenopathy CARDIAC: RRR , no murmurs, rubs, gallops RESPIRATORY:  Clear to auscultation without rales, wheezing or rhonchi  ABDOMEN: Soft, non-tender, non-distended MUSCULOSKELETAL:  No edema; No deformity  SKIN: Warm and dry NEUROLOGIC:  Alert and oriented x 3    ASSESSMENT AND PLAN: .   1.  Orthostatic hypotension: Her symptoms of orthostasis are still present but seem to be a little bit better.  I encouraged her to continue to add salt to her food.  She enjoyed drinking the V8 but it was causing gastroesophageal reflux.  I suggested she try drinking V8 later in the day with perhaps dinner or lunch.   2.  Incidental finding of atrial septal aneurysm: She has not had any stroke symptoms.  I encouraged her to continue to exercise.  We discussed the fact that we would not attempt to close this unless she had any TIA or strokelike symptoms.  Dispo: 1 year    Signed, Kristeen Miss, MD

## 2023-12-15 ENCOUNTER — Ambulatory Visit: Payer: Medicaid Other | Attending: Cardiovascular Disease | Admitting: Cardiovascular Disease

## 2023-12-15 ENCOUNTER — Encounter: Payer: Self-pay | Admitting: Cardiovascular Disease

## 2023-12-15 VITALS — BP 98/62 | HR 79 | Ht 65.0 in | Wt 167.8 lb

## 2023-12-15 DIAGNOSIS — I951 Orthostatic hypotension: Secondary | ICD-10-CM | POA: Diagnosis not present

## 2023-12-15 DIAGNOSIS — Q2112 Patent foramen ovale: Secondary | ICD-10-CM

## 2023-12-15 NOTE — Telephone Encounter (Signed)
 Keep appt for AEX, will address then. Do not use any products in the vagina this week.

## 2023-12-15 NOTE — Patient Instructions (Signed)
 Follow-Up: At [facility], you and your health needs are our priority.  As part of our continuing mission to provide you with exceptional heart care, we have created designated Provider Care Teams.  These Care Teams include your primary Cardiologist (physician) and Advanced Practice Providers (APPs -  Physician Assistants and Nurse Practitioners) who all work together to provide you with the care you need, when you need it.  Your next appointment:   1 year(s)  Provider:   Kristeen Miss, MD   1st Floor: - Lobby - Registration  - Pharmacy  - Lab - Cafe  2nd Floor: - PV Lab - Diagnostic Testing (echo, CT, nuclear med)  3rd Floor: - Vacant  4th Floor: - TCTS (cardiothoracic surgery) - AFib Clinic - Structural Heart Clinic - Vascular Surgery  - Vascular Ultrasound  5th Floor: - HeartCare Cardiology (general and EP) - Clinical Pharmacy for coumadin, hypertension, lipid, weight-loss medications, and med management appointments    Valet parking services will be available as well.

## 2023-12-17 ENCOUNTER — Other Ambulatory Visit: Payer: Self-pay | Admitting: Family Medicine

## 2023-12-17 DIAGNOSIS — Z1231 Encounter for screening mammogram for malignant neoplasm of breast: Secondary | ICD-10-CM

## 2023-12-18 ENCOUNTER — Other Ambulatory Visit (HOSPITAL_COMMUNITY)
Admission: RE | Admit: 2023-12-18 | Discharge: 2023-12-18 | Disposition: A | Discharge status: Home / Self Care | Source: Ambulatory Visit | Attending: Radiology | Admitting: Radiology

## 2023-12-18 ENCOUNTER — Encounter: Payer: Self-pay | Admitting: Radiology

## 2023-12-18 ENCOUNTER — Ambulatory Visit (INDEPENDENT_AMBULATORY_CARE_PROVIDER_SITE_OTHER): Payer: Medicaid Other | Admitting: Radiology

## 2023-12-18 VITALS — BP 106/76 | HR 76 | Ht 65.5 in | Wt 168.2 lb

## 2023-12-18 DIAGNOSIS — Z01419 Encounter for gynecological examination (general) (routine) without abnormal findings: Secondary | ICD-10-CM | POA: Diagnosis present

## 2023-12-18 DIAGNOSIS — Z30018 Encounter for initial prescription of other contraceptives: Secondary | ICD-10-CM | POA: Diagnosis not present

## 2023-12-18 DIAGNOSIS — N761 Subacute and chronic vaginitis: Secondary | ICD-10-CM

## 2023-12-18 DIAGNOSIS — Z1331 Encounter for screening for depression: Secondary | ICD-10-CM

## 2023-12-18 LAB — WET PREP FOR TRICH, YEAST, CLUE

## 2023-12-18 MED ORDER — PHEXXI 1.8-1-0.4 % VA GEL: 5.0000 g | VAGINAL | 11 refills | Status: DC

## 2023-12-18 NOTE — Patient Instructions (Signed)

## 2023-12-18 NOTE — Progress Notes (Signed)
 Mary Wyatt 30-Apr-1986 [id number]   History:  38 y.o. G0 presents for annual exam. Has been struggling with recurrent BV and yeast since having IUD (since removed). Symptoms are improving after using boric acid and vaginal estrogen. Would like to talk about birth control options. Partner is considering a vasectomy. Hx of abnormal paps and cryo years ago. Mammogram is scheduled.  Gynecologic History Patient's last menstrual period was 12/07/2023 (exact date). Period Cycle (Days):  (22-29) Period Duration (Days): 6 Period Pattern: (!) Irregular Menstrual Flow: Light, Moderate, Heavy Menstrual Control: Thin pad, Maxi pad, Tampon Dysmenorrhea: (!) Severe Dysmenorrhea Symptoms: Cramping Contraception/Family planning: condoms Sexually active: yes Last Pap: 2023. Results were: unsure   Obstetric History OB History  Gravida Para Term Preterm AB Living  0 0 0 0 0 0  SAB IAB Ectopic Multiple Live Births  0 0 0 0 0       12/18/2023    8:18 AM 08/10/2018   10:08 AM 07/23/2018    8:34 AM  Depression screen PHQ 2/9  Decreased Interest 1 0 0  Down, Depressed, Hopeless 1 0 0  PHQ - 2 Score 2 0 0  Altered sleeping 1    Tired, decreased energy 3    Change in appetite 1    Feeling bad or failure about yourself  0    Trouble concentrating 1    Moving slowly or fidgety/restless 0    Suicidal thoughts 0    PHQ-9 Score 8    Difficult doing work/chores Not difficult at all       The following portions of the patient's history were reviewed and updated as appropriate: allergies, current medications, past family history, past medical history, past social history, past surgical history, and problem list.  Review of Systems  All other systems reviewed and are negative.   Past medical history, past surgical history, family history and social history were all reviewed and documented in the EPIC chart.  Exam:  Vitals:   12/18/23 0817  BP: 106/76  Pulse: 76  SpO2: 96%  Weight:  168 lb 3.2 oz (76.3 kg)  Height: 5' 5.5" (1.664 m)   Body mass index is 27.56 kg/m.  Physical Exam Vitals and nursing note reviewed. Exam conducted with a chaperone present.  Constitutional:      Appearance: Normal appearance. She is normal weight.  HENT:     Head: Normocephalic and atraumatic.  Neck:     Thyroid: No thyroid mass, thyromegaly or thyroid tenderness.  Cardiovascular:     Rate and Rhythm: Regular rhythm.     Heart sounds: Normal heart sounds.  Pulmonary:     Effort: Pulmonary effort is normal.     Breath sounds: Normal breath sounds.  Chest:  Breasts:    Breasts are symmetrical.     Right: Normal. No inverted nipple, mass, nipple discharge, skin change or tenderness.     Left: Normal. No inverted nipple, mass, nipple discharge, skin change or tenderness.  Abdominal:     General: Abdomen is flat. Bowel sounds are normal.     Palpations: Abdomen is soft.  Genitourinary:    General: Normal vulva.     Vagina: Vaginal discharge present. No bleeding or lesions.     Cervix: Normal. No discharge or lesion.     Uterus: Normal. Not enlarged and not tender.      Adnexa: Right adnexa normal and left adnexa normal.       Right: No mass, tenderness or fullness.  Left: No mass, tenderness or fullness.    Lymphadenopathy:     Upper Body:     Right upper body: No axillary adenopathy.     Left upper body: No axillary adenopathy.  Skin:    General: Skin is warm and dry.  Neurological:     Mental Status: She is alert and oriented to person, place, and time.  Psychiatric:        Mood and Affect: Mood normal.        Thought Content: Thought content normal.        Judgment: Judgment normal.      Raynelle Fanning, [name] present for exam  Assessment/Plan:   1. Well woman exam with routine gynecological exam (Primary) - Cytology - PAP( [place]) + depression screen- managed by another provider  2. Subacute vaginitis Reassured negative wet prep - WET PREP FOR TRICH,  YEAST, CLUE  3. Encounter for initial prescription of other contraceptives Discussed BC options, still considering another IUD would like to try phexxi as it may prevent BV as well for now. - Lactic Ac-Citric Ac-Pot Bitart (PHEXXI) 1.8-1-0.4 % GEL; Place 5 g vaginally as directed. Before intercourse  Dispense: 180 g; Refill: 11    Discussed SBE, pap and mammogram screening as directed/appropriate. Recommend of exercise weekly, including weight bearing exercise. Encouraged the use of seatbelts and sunscreen.  Return in about 1 year (around 12/17/2024) for Annual.  Arlie Solomons B WHNP-BC 9:00 AM 12/18/2023

## 2023-12-22 ENCOUNTER — Other Ambulatory Visit: Payer: Self-pay

## 2023-12-22 DIAGNOSIS — N9489 Other specified conditions associated with female genital organs and menstrual cycle: Secondary | ICD-10-CM

## 2023-12-22 LAB — CYTOLOGY - PAP
Adequacy: ABSENT
Comment: NEGATIVE
Diagnosis: NEGATIVE
High risk HPV: NEGATIVE

## 2023-12-22 MED ORDER — PREMARIN 0.625 MG/GM VA CREA: 1.0000 | TOPICAL_CREAM | VAGINAL | 11 refills | Status: DC

## 2023-12-22 MED ORDER — PREMARIN 0.625 MG/GM VA CREA: 1.0000 | TOPICAL_CREAM | Freq: Every day | VAGINAL | 11 refills | Status: DC

## 2023-12-22 NOTE — Telephone Encounter (Signed)
 Patient returned call:  regarding Premarin Cream RX.  Patient uses 2xs weekly and not daily.  Pharmacy contacted by phone to cancel previous premarin vaginal cream RX as directions were incorrect. Corrected RX sent to pharmacy with directions to use twice weekly vaginally.  Patient notified.

## 2023-12-22 NOTE — Telephone Encounter (Signed)
 Medicaid does not cover generic Estradiol vaginal cream.  Premarin vaginal cream is the preferred medication.   Sent to provider for approval.

## 2023-12-22 NOTE — Telephone Encounter (Signed)
 Prior authorization request received from covermymeds for Estradiol 0.1 mg cream.  PA initiated.  Patient notified. KEY: [id number] DX: N94.89 vaginal burning

## 2023-12-24 ENCOUNTER — Ambulatory Visit
Admission: RE | Admit: 2023-12-24 | Discharge: 2023-12-24 | Disposition: A | Discharge status: Home / Self Care | Payer: BC Managed Care – PPO | Source: Ambulatory Visit | Attending: Family Medicine | Admitting: Family Medicine

## 2023-12-24 DIAGNOSIS — Z1231 Encounter for screening mammogram for malignant neoplasm of breast: Secondary | ICD-10-CM

## 2023-12-31 ENCOUNTER — Other Ambulatory Visit: Payer: Self-pay

## 2023-12-31 ENCOUNTER — Encounter: Payer: Self-pay | Admitting: Physical Therapy

## 2023-12-31 ENCOUNTER — Ambulatory Visit: Payer: BC Managed Care – PPO | Attending: Family Medicine | Admitting: Physical Therapy

## 2023-12-31 DIAGNOSIS — R293 Abnormal posture: Secondary | ICD-10-CM | POA: Insufficient documentation

## 2023-12-31 DIAGNOSIS — M62838 Other muscle spasm: Secondary | ICD-10-CM | POA: Diagnosis present

## 2023-12-31 DIAGNOSIS — R279 Unspecified lack of coordination: Secondary | ICD-10-CM | POA: Insufficient documentation

## 2023-12-31 DIAGNOSIS — M6281 Muscle weakness (generalized): Secondary | ICD-10-CM | POA: Insufficient documentation

## 2023-12-31 NOTE — Therapy (Signed)
 OUTPATIENT PHYSICAL THERAPY FEMALE PELVIC EVALUATION   Patient Name: Mary Wyatt MRN: [id number] DOB:1986-01-11, 38 y.o., female Today's Date: 12/31/2023  END OF SESSION:  PT End of Session - 12/31/23 0801     Visit Number 1    Date for PT Re-Evaluation 07/02/24    Authorization Type [employer]    PT Start Time 0801    PT Stop Time 0843    PT Time Calculation (min) 42 min    Activity Tolerance Patient tolerated treatment well    Behavior During Therapy [facility] for tasks assessed/performed             Past Medical History:  Diagnosis Date   Abdominal pain    Abnormal EKG    Dec 2019 and Jan 2020/ saw cardiologist   Allergy    Altered bowel function    Anemia    Anxiety    Biliary dyskinesia    Celiac disease    Chest tightness    Depression    Dermatitis    Deviated septum    DOE (dyspnea on exertion)    Dysmenorrhea    Endometriosis    Episodic lightheadedness    ETD (Eustachian tube dysfunction), bilateral    Family history of brain aneurysm    Genital warts    GERD (gastroesophageal reflux disease)    Hashimoto's disease    History of meningioma    Hypotension    Insomnia    Interstitial cystitis    Meningioma (HCC)    Mononucleosis    Pelvic pain    Perineal pain    Post-operative nausea and vomiting    Rhinitis    [facility] spotted fever) 2015   S/P resection of meningioma    Seasonal allergic rhinitis    Tendinitis    Ulcer    Unspecified dyspareunia    Urethral syndrome    Vitamin D deficiency    Past Surgical History:  Procedure Laterality Date   COLONOSCOPY  2013   CRANIOTOMY  09/2018   laproscopic surgery     2013 and 2019   OVARIAN CYST REMOVAL     TONSILLECTOMY     UPPER GASTROINTESTINAL ENDOSCOPY  2013   Patient Active Problem List   Diagnosis Date Noted   Hypotension - chronic 09/19/2021   Anxiety and depression 06/15/2017   Family history of brain aneurysm 04/19/2017   Rhinitis, allergic 04/20/2014    Generalized anxiety disorder 04/20/2014   Celiac disease 12/15/2012   Interstitial cystitis     PCP: Gwenlyn Found, MD   REFERRING PROVIDER: Gwenlyn Found, MD   REFERRING DIAG: [phone] (ICD-10-CM) - Vulvodynia N34.3 (ICD-10-CM) - Urethral syndrome R10.2 (ICD-10-CM) - Vaginal pain  THERAPY DIAG:  Other muscle spasm  Muscle weakness (generalized)  Abnormal posture  Unspecified lack of coordination  Rationale for Evaluation and Treatment: Rehabilitation  ONSET DATE: 1.5 years ago  SUBJECTIVE:  SUBJECTIVE STATEMENT: Has had pelvic floor PT previously for endo pain but now is having urinary dribbling after urinating and will leak into underwear, some leakage with sneezing. Has had recurrent BV for over a year and half so unsure but thinks she has had instances of urinary incontinence without activity or urge but thinks its urine.  Has external burning/pain/dryness/ atrophy reported by gyn. Stopped drinking alcohol due to this causing burning at vulva Every once in awhile with orgasm will having cramping/pain Low back pain for the last year and did attempt PT but didn't resolve. Worse with standing longer than 10 mins  Fluid intake: water - 70 oz, coffee in am, black tea in afternoon, sometimes V8.   PAIN:  Are you having pain? Yes NPRS scale: 7/10 Pain location:  vulva  Pain type: burning Pain description: intermittent and hours to days    Aggravating factors: alcohol made worse, unsure what else Relieving factors: sometimes estrogen cream/BV treatment but not always  Low Back pain - 8/10 constant, spasm/tightness, standing longer 10 mins, walking, rest feels better  PRECAUTIONS: None  RED FLAGS: None   WEIGHT BEARING RESTRICTIONS: No  FALLS:  Has patient fallen in last 6  months? No  OCCUPATION: not currently   ACTIVITY LEVEL : active at home but less exercise.   PLOF: Independent  PATIENT GOALS: to be more comfortable and no urine leakage   PERTINENT HISTORY:  recurrent BV and yeast,anxiety, celiac disease, depression, endometriosis, genital warts, IC, CRANIOTOMY 2019,  Sexual abuse: No  BOWEL MOVEMENT: Pain with bowel movement: No Type of bowel movement:Type (Bristol Stool Scale) 4, Frequency daily, and Strain sometimes (does have hemorrhoids) Fully empty rectum: Yes: but has two morning bowel movements  Leakage: No Pads: No Fiber supplement/laxative No  URINATION: Pain with urination: No Fully empty bladder: Yes:   Stream: Strong Urgency: No Frequency: sometimes pending on the day 45 mins - hour Leakage: Sneezing and post void dribble Pads: No  INTERCOURSE:  Ability to have vaginal penetration Yes  Pain with intercourse: After Intercourse Dryness No doesn't feel dry during intercourse but feels sore after Climax: not painful, but was having a little pain at clitoris recently and did have hurt to climax but resolved  Marinoff Scale: 0/3  PREGNANCY: Vaginal deliveries 0  C-section deliveries 0 Currently pregnant No  PROLAPSE: None   OBJECTIVE:  Note: Objective measures were completed at Evaluation unless otherwise noted.  DIAGNOSTIC FINDINGS:    PATIENT SURVEYS:    PFIQ-7: 73  COGNITION: Overall cognitive status: Within functional limits for tasks assessed     SENSATION: Light touch: Deficits reports tingling and numbness in bil hands/feet that comes and goes - has neuro appointment  LUMBAR SPECIAL TESTS:  Single leg stance test: hip drop noted 5s on Lt stance leg and at 7s Rt stance leg, mild low back pain noted with either side, SI Compression/distraction test: Negative, and FABER test: Negative  FUNCTIONAL TESTS:  Functional squat - decreased descent by 50% and bil valgus noted  GAIT: WFL  POSTURE: rounded  shoulders, forward head, and posterior pelvic tilt   LUMBARAROM/PROM:  A/PROM A/PROM  eval  Flexion Limited by 25%  Extension WFL  Right lateral flexion Limited by 25%  Left lateral flexion Limited by 25%  Right rotation Limited by 25%  Left rotation Limited by 25%   (Blank rows = not tested)  LOWER EXTREMITY ROM:  Bil hamstrings and adductors limited by 25% and tightness noted in bil hip ER and  IR.   LOWER EXTREMITY MMT:  Bil hip abduction 3+/5 all other hips grossly 4/5; knees 5/5 PALPATION:   General: tightness noted in bil lumbar and thoracic paraspinals, bil gluteals  Pelvic Alignment: WFL  Abdominal: mld TTP lower quadrants, fascial restrictions throughout                 External Perineal Exam: decreased mobility at clitoral hood with dryness noted throughout vulva                             Internal Pelvic Floor: TTP throughout bil superficial and deep layers, trigger points noted throughout Rt obturator internus, pubococcygeus and bulbocavernosus   Patient confirms identification and approves PT to assess internal pelvic floor and treatment Yes No emotional/communication barriers or cognitive limitation. Patient is motivated to learn. Patient understands and agrees with treatment goals and plan. PT explains patient will be examined in standing, sitting, and lying down to see how their muscles and joints work. When they are ready, they will be asked to remove their underwear so PT can examine their perineum. The patient is also given the option of providing their own chaperone as one is not provided in our facility. The patient also has the right and is explained the right to defer or refuse any part of the evaluation or treatment including the internal exam. With the patient's consent, PT will use one gloved finger to gently assess the muscles of the pelvic floor, seeing how well it contracts and relaxes and if there is muscle symmetry. After, the patient will get dressed  and PT and patient will discuss exam findings and plan of care. PT and patient discuss plan of care, schedule, attendance policy and HEP activities.  PELVIC MMT:   MMT eval  Vaginal 3/5; 8s, 6 reps  Internal Anal Sphincter   External Anal Sphincter   Puborectalis   Diastasis Recti   (Blank rows = not tested)        TONE: Slightly increased  PROLAPSE: Not seen in hooklying with cough  TODAY'S TREATMENT:                                                                                                                              DATE:   12/31/23 EVAL Examination completed, findings reviewed, pt educated on POC, HEP. Pt motivated to participate in PT and agreeable to attempt recommendations.    If treatment provided at initial evaluation, no treatment charged due to lack of authorization.       PATIENT EDUCATION:  Education details: [id number] Person educated: Patient Education method: Explanation, Demonstration, Tactile cues, Verbal cues, and Handouts Education comprehension: verbalized understanding, returned demonstration, verbal cues required, tactile cues required, and needs further education  HOME EXERCISE PROGRAM: [id number]  ASSESSMENT:  CLINICAL IMPRESSION: Patient is a 37 y.o. female  who was seen today for physical therapy evaluation and treatment for pelvic  pain, vulva pain, increased urinary frequency and leakage. Pt does endorse chronic low back pain and has limited activity due to this as well, pain limits her to standing no more than 10 mins and walking longer than this hurts as well. Pt has struggled with BV and yeast infections, burning feeling at vulva fairly ocnstantly, endo and IC. Pt found to have decreased core and hip strength, decreased flexibility at spine and hips and fascial restrictions throughout abdomen. Patient consented to internal pelvic floor assessment vaginally this date and found to have decreased strength, endurance, and coordination and increased  tension and TTP throughout. Patient benefited from verbal cues for improved technique with pelvic floor contractions and relaxation for   Pt would benefit from additional PT to further address deficits.    OBJECTIVE IMPAIRMENTS: decreased activity tolerance, decreased coordination, decreased endurance, decreased mobility, decreased strength, increased fascial restrictions, increased muscle spasms, impaired flexibility, improper body mechanics, postural dysfunction, and pain.   ACTIVITY LIMITATIONS: carrying, lifting, standing, squatting, continence, and locomotion level  PARTICIPATION LIMITATIONS: interpersonal relationship and community activity  PERSONAL FACTORS: Fitness, Time since onset of injury/illness/exacerbation, and 1 comorbidity: medical history   are also affecting patient's functional outcome.   REHAB POTENTIAL: Good  CLINICAL DECISION MAKING: Stable/uncomplicated  EVALUATION COMPLEXITY: Low   GOALS: Goals reviewed with patient? Yes  SHORT TERM GOALS: Target date: 01/28/24  Pt to be I with HEP.  Baseline: Goal status: INITIAL  2.  Pt to be I with diaphragmatic breathing and pelvic floor mobility to decreased strain at pelvic floor and pain levels Baseline:  Goal status: INITIAL  3.  Pt to be I with urge drill to decreased frequency of urine and improved ability to complete work day without frequent bathroom breaks.  Baseline:  Goal status: INITIAL  4.  Pt to be with knack to decreased urinary incontinence with stressors.  Baseline:  Goal status: INITIAL  LONG TERM GOALS: Target date: 07/02/2024  Pt to be I with advanced HEP.  Baseline:  Goal status: INITIAL  2.  Pt will be able to go 2-3 hours in between voids without urgency or incontinence in order to improve QOL and perform all functional activities with less difficulty.   Baseline:  Goal status: INITIAL  3.  Pt will increase all impaired lumbar A/ROM by 25% without pain.  Baseline:  Goal status:  INITIAL  4.  Pt to report improved tolerance to walking at least 30 mins without increased pain for returning healthy exercise levels.  Baseline:  Goal status: INITIAL  5.  Pt to report no more than 1/10 pelvic pain for improved QOL.  Baseline:  Goal status: INITIAL  6.  Pt to report no more than one instance of urinary incontinence in a week for improved QOL.  Baseline:  Goal status: INITIAL  PLAN:  PT FREQUENCY: 2x/week  PT DURATION:  12 sessions  PLANNED INTERVENTIONS: 97110-Therapeutic exercises, 97530- Therapeutic activity, 97112- Neuromuscular re-education, 97535- Self Care, 29562- Manual therapy, Patient/Family education, Balance training, Taping, Dry Needling, Joint mobilization, Spinal mobilization, Scar mobilization, DME instructions, Cryotherapy, Moist heat, and Biofeedback  PLAN FOR NEXT SESSION: internal as needed and pt consents, hip and trunk stretching, core and hip strengthening, voiding mechanics, coordination of pelvic floor    Otelia Sergeant, PT, DPT 12/31/2510:11 PM

## 2024-01-06 ENCOUNTER — Encounter: Payer: Self-pay | Admitting: Physical Therapy

## 2024-01-06 ENCOUNTER — Ambulatory Visit: Payer: BC Managed Care – PPO | Admitting: Physical Therapy

## 2024-01-06 DIAGNOSIS — M62838 Other muscle spasm: Secondary | ICD-10-CM

## 2024-01-06 DIAGNOSIS — R293 Abnormal posture: Secondary | ICD-10-CM

## 2024-01-06 DIAGNOSIS — M6281 Muscle weakness (generalized): Secondary | ICD-10-CM

## 2024-01-06 NOTE — Therapy (Signed)
 OUTPATIENT PHYSICAL THERAPY Mary PELVIC TREATMENT   Patient Name: Mary Wyatt MRN: [id number] DOB:Mar 17, 1986, 38 y.o., Mary Today's Date: 01/06/2024  END OF SESSION:  PT End of Session - 01/06/24 0805     Visit Number 2    Date for PT Re-Evaluation 07/02/24    Authorization Type [employer]    PT Start Time 0804   arrival   PT Stop Time 0845    PT Time Calculation (min) 41 min    Activity Tolerance Patient tolerated treatment well    Behavior During Therapy [facility] for tasks assessed/performed             Past Medical History:  Diagnosis Date   Abdominal pain    Abnormal EKG    Dec 2019 and Jan 2020/ saw cardiologist   Allergy    Altered bowel function    Anemia    Anxiety    Biliary dyskinesia    Celiac disease    Chest tightness    Depression    Dermatitis    Deviated septum    DOE (dyspnea on exertion)    Dysmenorrhea    Endometriosis    Episodic lightheadedness    ETD (Eustachian tube dysfunction), bilateral    Family history of brain aneurysm    Genital warts    GERD (gastroesophageal reflux disease)    Hashimoto's disease    History of meningioma    Hypotension    Insomnia    Interstitial cystitis    Meningioma (HCC)    Mononucleosis    Pelvic pain    Perineal pain    Post-operative nausea and vomiting    Rhinitis    RMSF [facility] spotted fever) 2015   S/P resection of meningioma    Seasonal allergic rhinitis    Tendinitis    Ulcer    Unspecified dyspareunia    Urethral syndrome    Vitamin D deficiency    Past Surgical History:  Procedure Laterality Date   COLONOSCOPY  2013   CRANIOTOMY  09/2018   laproscopic surgery     2013 and 2019   OVARIAN CYST REMOVAL     TONSILLECTOMY     UPPER GASTROINTESTINAL ENDOSCOPY  2013   Patient Active Problem List   Diagnosis Date Noted   Hypotension - chronic 09/19/2021   Anxiety and depression 06/15/2017   Family history of brain aneurysm 04/19/2017   Rhinitis, allergic  04/20/2014   Generalized anxiety disorder 04/20/2014   Celiac disease 12/15/2012   Interstitial cystitis     PCP: Gwenlyn Found, MD   REFERRING PROVIDER: Gwenlyn Found, MD   REFERRING DIAG: [phone] (ICD-10-CM) - Vulvodynia N34.3 (ICD-10-CM) - Urethral syndrome R10.2 (ICD-10-CM) - Vaginal pain  THERAPY DIAG:  Other muscle spasm  Muscle weakness (generalized)  Abnormal posture  Rationale for Evaluation and Treatment: Rehabilitation  ONSET DATE: 1.5 years ago  SUBJECTIVE:  SUBJECTIVE STATEMENT: Pt reports having tightness after climax last week and reports she had a difficult time urinating completely for about 4 hours. Then resolved completely. Pt does report HEP has been helpful for relaxing and hip stretching  Fluid intake: water - 70 oz, coffee in am, black tea in afternoon, sometimes V8.   PAIN:  Are you having pain? Yes NPRS scale: 7/10 Pain location:  vulva  Pain type: burning Pain description: intermittent and hours to days    Aggravating factors: alcohol made worse, unsure what else Relieving factors: sometimes estrogen cream/BV treatment but not always  Low Back pain - 8/10 constant, spasm/tightness, standing longer 10 mins, walking, rest feels better  PRECAUTIONS: None  RED FLAGS: None   WEIGHT BEARING RESTRICTIONS: No  FALLS:  Has patient fallen in last 6 months? No  OCCUPATION: not currently   ACTIVITY LEVEL : active at home but less exercise.   PLOF: Independent  PATIENT GOALS: to be more comfortable and no urine leakage   PERTINENT HISTORY:  recurrent BV and yeast,anxiety, celiac disease, depression, endometriosis, genital warts, IC, CRANIOTOMY 2019,  Sexual abuse: No  BOWEL MOVEMENT: Pain with bowel movement: No Type of bowel movement:Type  (Bristol Stool Scale) 4, Frequency daily, and Strain sometimes (does have hemorrhoids) Fully empty rectum: Yes: but has two morning bowel movements  Leakage: No Pads: No Fiber supplement/laxative No  URINATION: Pain with urination: No Fully empty bladder: Yes:   Stream: Strong Urgency: No Frequency: sometimes pending on the day 45 mins - hour Leakage: Sneezing and post void dribble Pads: No  INTERCOURSE:  Ability to have vaginal penetration Yes  Pain with intercourse: After Intercourse Dryness No doesn't feel dry during intercourse but feels sore after Climax: not painful, but was having a little pain at clitoris recently and did have hurt to climax but resolved  Marinoff Scale: 0/3  PREGNANCY: Vaginal deliveries 0  C-section deliveries 0 Currently pregnant No  PROLAPSE: None   OBJECTIVE:  Note: Objective measures were completed at Evaluation unless otherwise noted.  DIAGNOSTIC FINDINGS:    PATIENT SURVEYS:    PFIQ-7: 24  COGNITION: Overall cognitive status: Within functional limits for tasks assessed     SENSATION: Light touch: Deficits reports tingling and numbness in bil hands/feet that comes and goes - has neuro appointment  LUMBAR SPECIAL TESTS:  Single leg stance test: hip drop noted 5s on Lt stance leg and at 7s Rt stance leg, mild low back pain noted with either side, SI Compression/distraction test: Negative, and FABER test: Negative  FUNCTIONAL TESTS:  Functional squat - decreased descent by 50% and bil valgus noted  GAIT: WFL  POSTURE: rounded shoulders, forward head, and posterior pelvic tilt   LUMBARAROM/PROM:  A/PROM A/PROM  eval  Flexion Limited by 25%  Extension WFL  Right lateral flexion Limited by 25%  Left lateral flexion Limited by 25%  Right rotation Limited by 25%  Left rotation Limited by 25%   (Blank rows = not tested)  LOWER EXTREMITY ROM:  Bil hamstrings and adductors limited by 25% and tightness noted in bil hip ER  and IR.   LOWER EXTREMITY MMT:  Bil hip abduction 3+/5 all other hips grossly 4/5; knees 5/5 PALPATION:   General: tightness noted in bil lumbar and thoracic paraspinals, bil gluteals  Pelvic Alignment: WFL  Abdominal: mld TTP lower quadrants, fascial restrictions throughout                 External Perineal Exam: decreased mobility at  clitoral hood with dryness noted throughout vulva                             Internal Pelvic Floor: TTP throughout bil superficial and deep layers, trigger points noted throughout Rt obturator internus, pubococcygeus and bulbocavernosus   Patient confirms identification and approves PT to assess internal pelvic floor and treatment Yes No emotional/communication barriers or cognitive limitation. Patient is motivated to learn. Patient understands and agrees with treatment goals and plan. PT explains patient will be examined in standing, sitting, and lying down to see how their muscles and joints work. When they are ready, they will be asked to remove their underwear so PT can examine their perineum. The patient is also given the option of providing their own chaperone as one is not provided in our facility. The patient also has the right and is explained the right to defer or refuse any part of the evaluation or treatment including the internal exam. With the patient's consent, PT will use one gloved finger to gently assess the muscles of the pelvic floor, seeing how well it contracts and relaxes and if there is muscle symmetry. After, the patient will get dressed and PT and patient will discuss exam findings and plan of care. PT and patient discuss plan of care, schedule, attendance policy and HEP activities.  PELVIC MMT:   MMT eval  Vaginal 3/5; 8s, 6 reps  Internal Anal Sphincter   External Anal Sphincter   Puborectalis   Diastasis Recti   (Blank rows = not tested)        TONE: Slightly increased  PROLAPSE: Not seen in hooklying with cough  TODAY'S  TREATMENT:                                                                                                                              DATE:   12/31/23 EVAL Examination completed, findings reviewed, pt educated on POC, HEP. Pt motivated to participate in PT and agreeable to attempt recommendations.    If treatment provided at initial evaluation, no treatment charged due to lack of authorization.      01/06/24: Manual work at external pelvic floor for clitoral hood mobility, bil ischiocavernosus, Rt bulbocavernosus, and Rt proximal adductor and medial glutes. Pt had tension throughout and poor mobility at clitoral hood but all greatly improved with manual work. Cues throughout for relaxation techniques  Throughout manual pt educated on external stimulation/manual at home and internally for improved mobility and decreased pain. Also educated to use a lubricant during Vibration plate 1 min standing with cues for pelvic drops, 1 min sitting with cues for pelvic drops   PATIENT EDUCATION:  Education details: [id number] Person educated: Patient Education method: Explanation, Demonstration, Tactile cues, Verbal cues, and Handouts Education comprehension: verbalized understanding, returned demonstration, verbal cues required, tactile cues required, and needs further education  HOME EXERCISE PROGRAM: [id number]  ASSESSMENT:  CLINICAL IMPRESSION: Patient is a 38 y.o. Mary  who was seen today for physical therapy evaluation and treatment for pelvic pain, vulva pain, increased urinary frequency and leakage. Pt tolerated session well, demonstrates TTP and tension at external pelvic floor. Most of session spent here today to improve tolerance to internal manual work then progress to core and hip strengthening to decreased strain at pelvic floor. Pt educated and agreeable to external and internal pelvic floor manual work at home for improved mobility outside of clinic as well.  Pt would benefit from  additional PT to further address deficits.    OBJECTIVE IMPAIRMENTS: decreased activity tolerance, decreased coordination, decreased endurance, decreased mobility, decreased strength, increased fascial restrictions, increased muscle spasms, impaired flexibility, improper body mechanics, postural dysfunction, and pain.   ACTIVITY LIMITATIONS: carrying, lifting, standing, squatting, continence, and locomotion level  PARTICIPATION LIMITATIONS: interpersonal relationship and community activity  PERSONAL FACTORS: Fitness, Time since onset of injury/illness/exacerbation, and 1 comorbidity: medical history   are also affecting patient's functional outcome.   REHAB POTENTIAL: Good  CLINICAL DECISION MAKING: Stable/uncomplicated  EVALUATION COMPLEXITY: Low   GOALS: Goals reviewed with patient? Yes  SHORT TERM GOALS: Target date: 01/28/24  Pt to be I with HEP.  Baseline: Goal status: INITIAL  2.  Pt to be I with diaphragmatic breathing and pelvic floor mobility to decreased strain at pelvic floor and pain levels Baseline:  Goal status: INITIAL  3.  Pt to be I with urge drill to decreased frequency of urine and improved ability to complete work day without frequent bathroom breaks.  Baseline:  Goal status: INITIAL  4.  Pt to be with knack to decreased urinary incontinence with stressors.  Baseline:  Goal status: INITIAL  LONG TERM GOALS: Target date: 07/02/2024  Pt to be I with advanced HEP.  Baseline:  Goal status: INITIAL  2.  Pt will be able to go 2-3 hours in between voids without urgency or incontinence in order to improve QOL and perform all functional activities with less difficulty.   Baseline:  Goal status: INITIAL  3.  Pt will increase all impaired lumbar A/ROM by 25% without pain.  Baseline:  Goal status: INITIAL  4.  Pt to report improved tolerance to walking at least 30 mins without increased pain for returning healthy exercise levels.  Baseline:  Goal status:  INITIAL  5.  Pt to report no more than 1/10 pelvic pain for improved QOL.  Baseline:  Goal status: INITIAL  6.  Pt to report no more than one instance of urinary incontinence in a week for improved QOL.  Baseline:  Goal status: INITIAL  PLAN:  PT FREQUENCY: 2x/week  PT DURATION:  12 sessions  PLANNED INTERVENTIONS: 97110-Therapeutic exercises, 97530- Therapeutic activity, 97112- Neuromuscular re-education, 97535- Self Care, 62952- Manual therapy, Patient/Family education, Balance training, Taping, Dry Needling, Joint mobilization, Spinal mobilization, Scar mobilization, DME instructions, Cryotherapy, Moist heat, and Biofeedback  PLAN FOR NEXT SESSION: internal as needed and pt consents, hip and trunk stretching, core and hip strengthening, voiding mechanics, coordination of pelvic floor    Otelia Sergeant, PT, DPT 03/19/258:49 AM

## 2024-01-13 ENCOUNTER — Ambulatory Visit (INDEPENDENT_AMBULATORY_CARE_PROVIDER_SITE_OTHER): Payer: BC Managed Care – PPO | Admitting: Physical Therapy

## 2024-01-13 DIAGNOSIS — R293 Abnormal posture: Secondary | ICD-10-CM

## 2024-01-13 DIAGNOSIS — M62838 Other muscle spasm: Secondary | ICD-10-CM | POA: Diagnosis not present

## 2024-01-13 DIAGNOSIS — M6281 Muscle weakness (generalized): Secondary | ICD-10-CM

## 2024-01-13 NOTE — Therapy (Signed)
 OUTPATIENT PHYSICAL THERAPY FEMALE PELVIC TREATMENT   Patient Name: Mary Wyatt MRN: [id number] DOB:[birth year], 38 y.o., female Today's Date: 01/13/2024  END OF SESSION:  PT End of Session - 01/13/24 0805     Visit Number 3    Date for PT Re-Evaluation 07/02/24    Authorization Type [employer]    PT Start Time 0804    PT Stop Time 0843    PT Time Calculation (min) 39 min    Activity Tolerance Patient tolerated treatment well    Behavior During Therapy [facility] for tasks assessed/performed             Past Medical History:  Diagnosis Date   Abdominal pain    Abnormal EKG    Dec 2019 and Jan 2020/ saw cardiologist   Allergy    Altered bowel function    Anemia    Anxiety    Biliary dyskinesia    Celiac disease    Chest tightness    Depression    Dermatitis    Deviated septum    DOE (dyspnea on exertion)    Dysmenorrhea    Endometriosis    Episodic lightheadedness    ETD (Eustachian tube dysfunction), bilateral    Family history of brain aneurysm    Genital warts    GERD (gastroesophageal reflux disease)    Hashimoto's disease    History of meningioma    Hypotension    Insomnia    Interstitial cystitis    Meningioma (HCC)    Mononucleosis    Pelvic pain    Perineal pain    Post-operative nausea and vomiting    Rhinitis    RMSF [facility] spotted fever) 2015   S/P resection of meningioma    Seasonal allergic rhinitis    Tendinitis    Ulcer    Unspecified dyspareunia    Urethral syndrome    Vitamin D deficiency    Past Surgical History:  Procedure Laterality Date   COLONOSCOPY  2013   CRANIOTOMY  09/2018   laproscopic surgery     2013 and 2019   OVARIAN CYST REMOVAL     TONSILLECTOMY     UPPER GASTROINTESTINAL ENDOSCOPY  2013   Patient Active Problem List   Diagnosis Date Noted   Hypotension - chronic 09/19/2021   Anxiety and depression 06/15/2017   Family history of brain aneurysm 04/19/2017   Rhinitis, allergic 04/20/2014    Generalized anxiety disorder 04/20/2014   Celiac disease 12/15/2012   Interstitial cystitis     PCP: Gwenlyn Found, MD   REFERRING PROVIDER: Gwenlyn Found, MD   REFERRING DIAG: [phone] (ICD-10-CM) - Vulvodynia N34.3 (ICD-10-CM) - Urethral syndrome R10.2 (ICD-10-CM) - Vaginal pain  THERAPY DIAG:  Other muscle spasm  Muscle weakness (generalized)  Abnormal posture  Rationale for Evaluation and Treatment: Rehabilitation  ONSET DATE: 1.5 years ago  SUBJECTIVE:  SUBJECTIVE STATEMENT: Pt reports she has had climax without urinary hesitation now and thinks that was something random last week as this hasn't happened anymore. Has still been having a little post void dribbling. Has tried vibrator and reports she does think pain was a little better than prior to therapy.   Fluid intake: water - 70 oz, coffee in am, black tea in afternoon, sometimes V8.   PAIN:  Are you having pain? Yes NPRS scale: 7/10 Pain location:  vulva  Pain type: burning Pain description: intermittent and hours to days    Aggravating factors: alcohol made worse, unsure what else Relieving factors: sometimes estrogen cream/BV treatment but not always  Low Back pain - 8/10 constant, spasm/tightness, standing longer 10 mins, walking, rest feels better  PRECAUTIONS: None  RED FLAGS: None   WEIGHT BEARING RESTRICTIONS: No  FALLS:  Has patient fallen in last 6 months? No  OCCUPATION: not currently   ACTIVITY LEVEL : active at home but less exercise.   PLOF: Independent  PATIENT GOALS: to be more comfortable and no urine leakage   PERTINENT HISTORY:  recurrent BV and yeast,anxiety, celiac disease, depression, endometriosis, genital warts, IC, CRANIOTOMY 2019,  Sexual abuse: No  BOWEL MOVEMENT: Pain with  bowel movement: No Type of bowel movement:Type (Bristol Stool Scale) 4, Frequency daily, and Strain sometimes (does have hemorrhoids) Fully empty rectum: Yes: but has two morning bowel movements  Leakage: No Pads: No Fiber supplement/laxative No  URINATION: Pain with urination: No Fully empty bladder: Yes:   Stream: Strong Urgency: No Frequency: sometimes pending on the day 45 mins - hour Leakage: Sneezing and post void dribble Pads: No  INTERCOURSE:  Ability to have vaginal penetration Yes  Pain with intercourse: After Intercourse Dryness No doesn't feel dry during intercourse but feels sore after Climax: not painful, but was having a little pain at clitoris recently and did have hurt to climax but resolved  Marinoff Scale: 0/3  PREGNANCY: Vaginal deliveries 0  C-section deliveries 0 Currently pregnant No  PROLAPSE: None   OBJECTIVE:  Note: Objective measures were completed at Evaluation unless otherwise noted.  DIAGNOSTIC FINDINGS:    PATIENT SURVEYS:    PFIQ-7: 72  COGNITION: Overall cognitive status: Within functional limits for tasks assessed     SENSATION: Light touch: Deficits reports tingling and numbness in bil hands/feet that comes and goes - has neuro appointment  LUMBAR SPECIAL TESTS:  Single leg stance test: hip drop noted 5s on Lt stance leg and at 7s Rt stance leg, mild low back pain noted with either side, SI Compression/distraction test: Negative, and FABER test: Negative  FUNCTIONAL TESTS:  Functional squat - decreased descent by 50% and bil valgus noted  GAIT: WFL  POSTURE: rounded shoulders, forward head, and posterior pelvic tilt   LUMBARAROM/PROM:  A/PROM A/PROM  eval  Flexion Limited by 25%  Extension WFL  Right lateral flexion Limited by 25%  Left lateral flexion Limited by 25%  Right rotation Limited by 25%  Left rotation Limited by 25%   (Blank rows = not tested)  LOWER EXTREMITY ROM:  Bil hamstrings and adductors  limited by 25% and tightness noted in bil hip ER and IR.   LOWER EXTREMITY MMT:  Bil hip abduction 3+/5 all other hips grossly 4/5; knees 5/5 PALPATION:   General: tightness noted in bil lumbar and thoracic paraspinals, bil gluteals  Pelvic Alignment: WFL  Abdominal: mld TTP lower quadrants, fascial restrictions throughout  External Perineal Exam: decreased mobility at clitoral hood with dryness noted throughout vulva                             Internal Pelvic Floor: TTP throughout bil superficial and deep layers, trigger points noted throughout Rt obturator internus, pubococcygeus and bulbocavernosus   Patient confirms identification and approves PT to assess internal pelvic floor and treatment Yes No emotional/communication barriers or cognitive limitation. Patient is motivated to learn. Patient understands and agrees with treatment goals and plan. PT explains patient will be examined in standing, sitting, and lying down to see how their muscles and joints work. When they are ready, they will be asked to remove their underwear so PT can examine their perineum. The patient is also given the option of providing their own chaperone as one is not provided in our facility. The patient also has the right and is explained the right to defer or refuse any part of the evaluation or treatment including the internal exam. With the patient's consent, PT will use one gloved finger to gently assess the muscles of the pelvic floor, seeing how well it contracts and relaxes and if there is muscle symmetry. After, the patient will get dressed and PT and patient will discuss exam findings and plan of care. PT and patient discuss plan of care, schedule, attendance policy and HEP activities.  PELVIC MMT:   MMT eval  Vaginal 3/5; 8s, 6 reps  Internal Anal Sphincter   External Anal Sphincter   Puborectalis   Diastasis Recti   (Blank rows = not tested)        TONE: Slightly  increased  PROLAPSE: Not seen in hooklying with cough  TODAY'S TREATMENT:                                                                                                                              DATE:   12/31/23 EVAL Examination completed, findings reviewed, pt educated on POC, HEP. Pt motivated to participate in PT and agreeable to attempt recommendations.    If treatment provided at initial evaluation, no treatment charged due to lack of authorization.      01/06/24: Manual work at external pelvic floor for clitoral hood mobility, bil ischiocavernosus, Rt bulbocavernosus, and Rt proximal adductor and medial glutes. Pt had tension throughout and poor mobility at clitoral hood but all greatly improved with manual work. Cues throughout for relaxation techniques  Throughout manual pt educated on external stimulation/manual at home and internally for improved mobility and decreased pain. Also educated to use a lubricant during Vibration plate 1 min standing with cues for pelvic drops, 1 min sitting with cues for pelvic drops   01/13/24:  Manual at RT abdominal quadrants fascial release indirect technique for improved mobility and decreased strain at pelvic floor. With improvement here, progressed with pt consent to internal pelvic floor vaginally with focus at Rt obturator  as this is where pt had most of her pain. Pt had several trigger points and TTP with muscle tension here, moderate release but continues to have tension. Then gloves changed and external pelvic floor at Rt obturator manual completed with pt having profound tightness in bil medial glutes and pt stating "that is my pain." Gentle STM completed here and pt tolerated well but unable to fully release.   PATIENT EDUCATION:  Education details: [id number] Person educated: Patient Education method: Explanation, Demonstration, Tactile cues, Verbal cues, and Handouts Education comprehension: verbalized understanding, returned  demonstration, verbal cues required, tactile cues required, and needs further education  HOME EXERCISE PROGRAM: [id number]  ASSESSMENT:  CLINICAL IMPRESSION: Patient is a 38 y.o. female  who was seen today for physical therapy evaluation and treatment for pelvic pain, vulva pain, increased urinary frequency and leakage. Pt tolerated session well, demonstrates TTP and tension at external and internal pelvic floor. Manual and relaxation techniques completed today for improved tissue mobility and decreased pain. Pt tolerated well but continues to have tension and TTP at Rt obturator and has abdominal fascial tightness noted as well. Pt educated during manual on abdominal massage and self massage of obturator for home. Pt would benefit from additional PT to further address deficits.    OBJECTIVE IMPAIRMENTS: decreased activity tolerance, decreased coordination, decreased endurance, decreased mobility, decreased strength, increased fascial restrictions, increased muscle spasms, impaired flexibility, improper body mechanics, postural dysfunction, and pain.   ACTIVITY LIMITATIONS: carrying, lifting, standing, squatting, continence, and locomotion level  PARTICIPATION LIMITATIONS: interpersonal relationship and community activity  PERSONAL FACTORS: Fitness, Time since onset of injury/illness/exacerbation, and 1 comorbidity: medical history   are also affecting patient's functional outcome.   REHAB POTENTIAL: Good  CLINICAL DECISION MAKING: Stable/uncomplicated  EVALUATION COMPLEXITY: Low   GOALS: Goals reviewed with patient? Yes  SHORT TERM GOALS: Target date: 01/28/24  Pt to be I with HEP.  Baseline: Goal status: INITIAL  2.  Pt to be I with diaphragmatic breathing and pelvic floor mobility to decreased strain at pelvic floor and pain levels Baseline:  Goal status: INITIAL  3.  Pt to be I with urge drill to decreased frequency of urine and improved ability to complete work day without  frequent bathroom breaks.  Baseline:  Goal status: INITIAL  4.  Pt to be with knack to decreased urinary incontinence with stressors.  Baseline:  Goal status: INITIAL  LONG TERM GOALS: Target date: 07/02/2024  Pt to be I with advanced HEP.  Baseline:  Goal status: INITIAL  2.  Pt will be able to go 2-3 hours in between voids without urgency or incontinence in order to improve QOL and perform all functional activities with less difficulty.   Baseline:  Goal status: INITIAL  3.  Pt will increase all impaired lumbar A/ROM by 25% without pain.  Baseline:  Goal status: INITIAL  4.  Pt to report improved tolerance to walking at least 30 mins without increased pain for returning healthy exercise levels.  Baseline:  Goal status: INITIAL  5.  Pt to report no more than 1/10 pelvic pain for improved QOL.  Baseline:  Goal status: INITIAL  6.  Pt to report no more than one instance of urinary incontinence in a week for improved QOL.  Baseline:  Goal status: INITIAL  PLAN:  PT FREQUENCY: 2x/week  PT DURATION:  12 sessions  PLANNED INTERVENTIONS: 97110-Therapeutic exercises, 97530- Therapeutic activity, [id number]- Neuromuscular re-education, 97535- Self Care, 13086- Manual therapy, Patient/Family education, Balance training, Taping, Dry  Needling, Joint mobilization, Spinal mobilization, Scar mobilization, DME instructions, Cryotherapy, Moist heat, and Biofeedback  PLAN FOR NEXT SESSION: internal as needed and pt consents, hip and trunk stretching, core and hip strengthening, voiding mechanics, coordination of pelvic floor    Otelia Sergeant, PT, DPT 03/26/259:40 AM

## 2024-01-20 ENCOUNTER — Encounter: Payer: BC Managed Care – PPO | Admitting: Physical Therapy

## 2024-01-21 ENCOUNTER — Ambulatory Visit: Payer: Self-pay | Admitting: Physical Therapy

## 2024-01-27 ENCOUNTER — Ambulatory Visit: Payer: BC Managed Care – PPO | Attending: Family Medicine | Admitting: Physical Therapy

## 2024-01-27 DIAGNOSIS — R293 Abnormal posture: Secondary | ICD-10-CM | POA: Diagnosis present

## 2024-01-27 DIAGNOSIS — M62838 Other muscle spasm: Secondary | ICD-10-CM | POA: Diagnosis present

## 2024-01-27 DIAGNOSIS — M6281 Muscle weakness (generalized): Secondary | ICD-10-CM | POA: Insufficient documentation

## 2024-01-27 DIAGNOSIS — R279 Unspecified lack of coordination: Secondary | ICD-10-CM | POA: Insufficient documentation

## 2024-01-27 NOTE — Patient Instructions (Signed)

## 2024-01-27 NOTE — Therapy (Addendum)
 OUTPATIENT PHYSICAL THERAPY FEMALE PELVIC TREATMENT   Patient Name: Mary Wyatt MRN: [id number] DOB:Jul 01, 1986, 38 y.o., female Today's Date: 01/27/2024  END OF SESSION:  PT End of Session - 01/27/24 1105     Visit Number 4    Date for PT Re-Evaluation 07/02/24    Authorization Type [employer]    PT Start Time 1100    PT Stop Time 1142    PT Time Calculation (min) 42 min    Activity Tolerance Patient tolerated treatment well    Behavior During Therapy [facility] for tasks assessed/performed              Past Medical History:  Diagnosis Date   Abdominal pain    Abnormal EKG    Dec 2019 and Jan 2020/ saw cardiologist   Allergy    Altered bowel function    Anemia    Anxiety    Biliary dyskinesia    Celiac disease    Chest tightness    Depression    Dermatitis    Deviated septum    DOE (dyspnea on exertion)    Dysmenorrhea    Endometriosis    Episodic lightheadedness    ETD (Eustachian tube dysfunction), bilateral    Family history of brain aneurysm    Genital warts    GERD (gastroesophageal reflux disease)    Hashimoto's disease    History of meningioma    Hypotension    Insomnia    Interstitial cystitis    Meningioma (HCC)    Mononucleosis    Pelvic pain    Perineal pain    Post-operative nausea and vomiting    Rhinitis    [facility] spotted fever) 2015   S/P resection of meningioma    Seasonal allergic rhinitis    Tendinitis    Ulcer    Unspecified dyspareunia    Urethral syndrome    Vitamin D deficiency    Past Surgical History:  Procedure Laterality Date   COLONOSCOPY  2013   CRANIOTOMY  09/2018   laproscopic surgery     2013 and 2019   OVARIAN CYST REMOVAL     TONSILLECTOMY     UPPER GASTROINTESTINAL ENDOSCOPY  2013   Patient Active Problem List   Diagnosis Date Noted   Hypotension - chronic 09/19/2021   Anxiety and depression 06/15/2017   Family history of brain aneurysm 04/19/2017   Rhinitis, allergic 04/20/2014    Generalized anxiety disorder 04/20/2014   Celiac disease 12/15/2012   Interstitial cystitis     PCP: Gwenlyn Found, MD   REFERRING PROVIDER: Gwenlyn Found, MD   REFERRING DIAG: [phone] (ICD-10-CM) - Vulvodynia N34.3 (ICD-10-CM) - Urethral syndrome R10.2 (ICD-10-CM) - Vaginal pain  THERAPY DIAG:  Other muscle spasm  Muscle weakness (generalized)  Abnormal posture  Unspecified lack of coordination  Rationale for Evaluation and Treatment: Rehabilitation  ONSET DATE: 1.5 years ago  SUBJECTIVE:  SUBJECTIVE STATEMENT: Burning at vaginal opening/internal area is primary complaint today, 8/10. Notes closer to period burning is worse and is supposed to start soon.  Dribbling is getting better but returned a little with recent stress.  Has had a lot going on has had mold exposure and had to move. Reports flu like symptoms, headache, achy, fatigue, sinus symptoms and lightheadedness. Is on prednisone for this.    Fluid intake: water - 70 oz, coffee in am, black tea in afternoon, sometimes V8.   PAIN:  Are you having pain? Yes NPRS scale: 8/10 Pain location:  vaginal area  Pain type: burning Pain description: intermittent and hours to days    Aggravating factors: alcohol made worse, unsure what else Relieving factors: sometimes estrogen cream/BV treatment but not always  Low Back pain - 8/10 constant, spasm/tightness, standing longer 10 mins, walking, rest feels better  PRECAUTIONS: None  RED FLAGS: None   WEIGHT BEARING RESTRICTIONS: No  FALLS:  Has patient fallen in last 6 months? No  OCCUPATION: not currently   ACTIVITY LEVEL : active at home but less exercise.   PLOF: Independent  PATIENT GOALS: to be more comfortable and no urine leakage   PERTINENT HISTORY:   recurrent BV and yeast,anxiety, celiac disease, depression, endometriosis, genital warts, IC, CRANIOTOMY 2019,  Sexual abuse: No  BOWEL MOVEMENT: Pain with bowel movement: No Type of bowel movement:Type (Bristol Stool Scale) 4, Frequency daily, and Strain sometimes (does have hemorrhoids) Fully empty rectum: Yes: but has two morning bowel movements  Leakage: No Pads: No Fiber supplement/laxative No  URINATION: Pain with urination: No Fully empty bladder: Yes:   Stream: Strong Urgency: No Frequency: sometimes pending on the day 45 mins - hour Leakage: Sneezing and post void dribble Pads: No  INTERCOURSE:  Ability to have vaginal penetration Yes  Pain with intercourse: After Intercourse Dryness No doesn't feel dry during intercourse but feels sore after Climax: not painful, but was having a little pain at clitoris recently and did have hurt to climax but resolved  Marinoff Scale: 0/3  PREGNANCY: Vaginal deliveries 0  C-section deliveries 0 Currently pregnant No  PROLAPSE: None   OBJECTIVE:  Note: Objective measures were completed at Evaluation unless otherwise noted.  DIAGNOSTIC FINDINGS:    PATIENT SURVEYS:    PFIQ-7: 57 01/27/24 - 48  COGNITION: Overall cognitive status: Within functional limits for tasks assessed     SENSATION: Light touch: Deficits reports tingling and numbness in bil hands/feet that comes and goes - has neuro appointment  LUMBAR SPECIAL TESTS:  Single leg stance test: hip drop noted 5s on Lt stance leg and at 7s Rt stance leg, mild low back pain noted with either side, SI Compression/distraction test: Negative, and FABER test: Negative  FUNCTIONAL TESTS:  Functional squat - decreased descent by 50% and bil valgus noted  GAIT: WFL  POSTURE: rounded shoulders, forward head, and posterior pelvic tilt   LUMBARAROM/PROM:  A/PROM A/PROM  eval  Flexion Limited by 25%  Extension WFL  Right lateral flexion Limited by 25%  Left  lateral flexion Limited by 25%  Right rotation Limited by 25%  Left rotation Limited by 25%   (Blank rows = not tested)  LOWER EXTREMITY ROM:  Bil hamstrings and adductors limited by 25% and tightness noted in bil hip ER and IR.   LOWER EXTREMITY MMT:  Bil hip abduction 3+/5 all other hips grossly 4/5; knees 5/5 PALPATION:   General: tightness noted in bil lumbar and thoracic paraspinals, bil  gluteals  Pelvic Alignment: WFL  Abdominal: mld TTP lower quadrants, fascial restrictions throughout                 External Perineal Exam: decreased mobility at clitoral hood with dryness noted throughout vulva                             Internal Pelvic Floor: TTP throughout bil superficial and deep layers, trigger points noted throughout Rt obturator internus, pubococcygeus and bulbocavernosus   Patient confirms identification and approves PT to assess internal pelvic floor and treatment Yes No emotional/communication barriers or cognitive limitation. Patient is motivated to learn. Patient understands and agrees with treatment goals and plan. PT explains patient will be examined in standing, sitting, and lying down to see how their muscles and joints work. When they are ready, they will be asked to remove their underwear so PT can examine their perineum. The patient is also given the option of providing their own chaperone as one is not provided in our facility. The patient also has the right and is explained the right to defer or refuse any part of the evaluation or treatment including the internal exam. With the patient's consent, PT will use one gloved finger to gently assess the muscles of the pelvic floor, seeing how well it contracts and relaxes and if there is muscle symmetry. After, the patient will get dressed and PT and patient will discuss exam findings and plan of care. PT and patient discuss plan of care, schedule, attendance policy and HEP activities.  PELVIC MMT:   MMT eval 01/27/24   Vaginal 3/5; 8s, 6 reps 4/5  Internal Anal Sphincter    External Anal Sphincter    Puborectalis    Diastasis Recti    (Blank rows = not tested)        TONE: Slightly increased 4/9 - continues to be increased   PROLAPSE: Not seen in hooklying with cough  TODAY'S TREATMENT:                                                                                                                              DATE:   01/06/24: Manual work at external pelvic floor for clitoral hood mobility, bil ischiocavernosus, Rt bulbocavernosus, and Rt proximal adductor and medial glutes. Pt had tension throughout and poor mobility at clitoral hood but all greatly improved with manual work. Cues throughout for relaxation techniques  Throughout manual pt educated on external stimulation/manual at home and internally for improved mobility and decreased pain. Also educated to use a lubricant during Vibration plate 1 min standing with cues for pelvic drops, 1 min sitting with cues for pelvic drops   01/13/24:  Manual at RT abdominal quadrants fascial release indirect technique for improved mobility and decreased strain at pelvic floor. With improvement here, progressed with pt consent to internal pelvic floor vaginally with focus at Rt obturator as  this is where pt had most of her pain. Pt had several trigger points and TTP with muscle tension here, moderate release but continues to have tension. Then gloves changed and external pelvic floor at Rt obturator manual completed with pt having profound tightness in bil medial glutes and pt stating "that is my pain." Gentle STM completed here and pt tolerated well but unable to fully release.   01/27/24: External pelvic floor manual for improved tissue mobility, decreased trigger points and improved pain levels with intercourse and urinary frequency - bil obturators with STM and trigger point release work completed hands on with Lt worse than Rt however pt reports she has been  working on Rt side more at home.  Patient consented to internal pelvic floor treatment vaginally this date and found to have continued tension at bil obturators and pain with palpation however Lt more than Rt. Trigger point release work completed with several trigger points bil and more at Lt today, tolerated fairly but limited due to pain and tension remaining at end of session.    PATIENT EDUCATION:  Education details: [id number], urge drill  Person educated: Patient Education method: Explanation, Demonstration, Tactile cues, Verbal cues, and Handouts Education comprehension: verbalized understanding, returned demonstration, verbal cues required, tactile cues required, and needs further education  HOME EXERCISE PROGRAM: [id number]  ASSESSMENT:  CLINICAL IMPRESSION: Patient is a 38 y.o. female  who was seen today for physical therapy evaluation and treatment for pelvic pain, vulva pain, increased urinary frequency and leakage. Pt tolerated session well, demonstrates TTP and tension at external and internal pelvic floor. Manual and relaxation techniques completed today for improved tissue mobility and decreased pain. Pt does continue to have tension and TTP at bil obturators and has been doing abdominal massage and stretching but has had a lot of stress with mold exposure and needing to quickly move out of her home in with family now and her symptoms have returned at higher levels.pt did have improvement with symptoms, not resolved but better however with increase in stress and sickness they have returned. Pt would benefit from additional PT to further address deficits.    OBJECTIVE IMPAIRMENTS: decreased activity tolerance, decreased coordination, decreased endurance, decreased mobility, decreased strength, increased fascial restrictions, increased muscle spasms, impaired flexibility, improper body mechanics, postural dysfunction, and pain.   ACTIVITY LIMITATIONS: carrying, lifting, standing,  squatting, continence, and locomotion level  PARTICIPATION LIMITATIONS: interpersonal relationship and community activity  PERSONAL FACTORS: Fitness, Time since onset of injury/illness/exacerbation, and 1 comorbidity: medical history   are also affecting patient's functional outcome.   REHAB POTENTIAL: Good  CLINICAL DECISION MAKING: Stable/uncomplicated  EVALUATION COMPLEXITY: Low   GOALS: Goals reviewed with patient? Yes  SHORT TERM GOALS: Target date: 01/28/24  Pt to be I with HEP.  Baseline: Goal status: MET  2.  Pt to be I with diaphragmatic breathing and pelvic floor mobility to decreased strain at pelvic floor and pain levels Baseline:  Goal status:  MET  3.  Pt to be I with urge drill to decreased frequency of urine and improved ability to complete work day without frequent bathroom breaks.  Baseline:  Goal status:  MET  4.  Pt to be with knack to decreased urinary incontinence with stressors.  Baseline:  Goal status: on going  LONG TERM GOALS: Target date: 07/02/2024  Pt to be I with advanced HEP.  Baseline:  Goal status: on going  2.  Pt will be able to go 2-3 hours in between voids without  urgency or incontinence in order to improve QOL and perform all functional activities with less difficulty.   Baseline:  Goal status: on going  3.  Pt will increase all impaired lumbar A/ROM by 25% without pain.  Baseline:  Goal status: on going  4.  Pt to report improved tolerance to walking at least 30 mins without increased pain for returning healthy exercise levels.  Baseline:  Goal status: on going  5.  Pt to report no more than 1/10 pelvic pain for improved QOL.  Baseline:  Goal status: on going  6.  Pt to report no more than one instance of urinary incontinence in a week for improved QOL.  Baseline:  Goal status: on going  PLAN:  PT FREQUENCY: 2x/week  PT DURATION:  12 sessions  PLANNED INTERVENTIONS: 97110-Therapeutic exercises, 97530- Therapeutic  activity, 97112- Neuromuscular re-education, 97535- Self Care, 16109- Manual therapy, Patient/Family education, Balance training, Taping, Dry Needling, Joint mobilization, Spinal mobilization, Scar mobilization, DME instructions, Cryotherapy, Moist heat, and Biofeedback  PLAN FOR NEXT SESSION: internal as needed and pt consents, hip and trunk stretching, core and hip strengthening, voiding mechanics, coordination of pelvic floor    Otelia Sergeant, PT, DPT 01/26/2510:44 AM

## 2024-02-04 ENCOUNTER — Ambulatory Visit: Admitting: Physical Therapy

## 2024-02-09 ENCOUNTER — Ambulatory Visit: Admitting: Physical Therapy

## 2024-02-09 DIAGNOSIS — M6281 Muscle weakness (generalized): Secondary | ICD-10-CM

## 2024-02-09 DIAGNOSIS — M62838 Other muscle spasm: Secondary | ICD-10-CM

## 2024-02-09 DIAGNOSIS — R293 Abnormal posture: Secondary | ICD-10-CM

## 2024-02-09 NOTE — Therapy (Signed)
 OUTPATIENT PHYSICAL THERAPY FEMALE PELVIC TREATMENT   Patient Name: Mary Wyatt MRN: [id number] DOB:1986/03/03, 38 y.o., female Today's Date: 02/09/2024  END OF SESSION:  PT End of Session - 02/09/24 1236     Visit Number 5    Date for PT Re-Evaluation 07/02/24    Authorization Type healthy blue    PT Start Time 1232    PT Stop Time 1313    PT Time Calculation (min) 41 min    Activity Tolerance Patient tolerated treatment well    Behavior During Therapy [facility] for tasks assessed/performed              Past Medical History:  Diagnosis Date   Abdominal pain    Abnormal EKG    Dec 2019 and Jan 2020/ saw cardiologist   Allergy    Altered bowel function    Anemia    Anxiety    Biliary dyskinesia    Celiac disease    Chest tightness    Depression    Dermatitis    Deviated septum    DOE (dyspnea on exertion)    Dysmenorrhea    Endometriosis    Episodic lightheadedness    ETD (Eustachian tube dysfunction), bilateral    Family history of brain aneurysm    Genital warts    GERD (gastroesophageal reflux disease)    Hashimoto's disease    History of meningioma    Hypotension    Insomnia    Interstitial cystitis    Meningioma (HCC)    Mononucleosis    Pelvic pain    Perineal pain    Post-operative nausea and vomiting    Rhinitis    [facility] spotted fever) 2015   S/P resection of meningioma    Seasonal allergic rhinitis    Tendinitis    Ulcer    Unspecified dyspareunia    Urethral syndrome    Vitamin D  deficiency    Past Surgical History:  Procedure Laterality Date   COLONOSCOPY  2013   CRANIOTOMY  09/2018   laproscopic surgery     2013 and 2019   OVARIAN CYST REMOVAL     TONSILLECTOMY     UPPER GASTROINTESTINAL ENDOSCOPY  2013   Patient Active Problem List   Diagnosis Date Noted   Hypotension - chronic 09/19/2021   Anxiety and depression 06/15/2017   Family history of brain aneurysm 04/19/2017   Rhinitis, allergic 04/20/2014    Generalized anxiety disorder 04/20/2014   Celiac disease 12/15/2012   Interstitial cystitis     PCP: Audria Leather, MD   REFERRING PROVIDER: Audria Leather, MD   REFERRING DIAG: [id number] (ICD-10-CM) - Vulvodynia N34.3 (ICD-10-CM) - Urethral syndrome R10.2 (ICD-10-CM) - Vaginal pain  THERAPY DIAG:  Other muscle spasm  Muscle weakness (generalized)  Abnormal posture  Rationale for Evaluation and Treatment: Rehabilitation  ONSET DATE: 1.5 years ago  SUBJECTIVE:  SUBJECTIVE STATEMENT: Urge drill has been helpful with understanding true urge to empty and sometimes able to be around 2 hours with urine. But does need to drink a lot of water for blood pressure and will need to urinate a little more after with this but other than this better.   Vaginal burning/pain comes and goes and sometimes for full days has zero pain but does feel like it goes with IC symptoms too.   Fluid intake: water - 70 oz, coffee in am, black tea in afternoon, sometimes V8.   PAIN:  Are you having pain? Yes NPRS scale: 5/10 Pain location:  vaginal area  Pain type: burning Pain description: intermittent and hours to days    Aggravating factors: alcohol made worse, unsure what else Relieving factors: sometimes estrogen cream/BV treatment but not always  Low Back pain - 8/10 constant, spasm/tightness, standing longer 10 mins, walking, rest feels better  PRECAUTIONS: None  RED FLAGS: None   WEIGHT BEARING RESTRICTIONS: No  FALLS:  Has patient fallen in last 6 months? No  OCCUPATION: not currently   ACTIVITY LEVEL : active at home but less exercise.   PLOF: Independent  PATIENT GOALS: to be more comfortable and no urine leakage   PERTINENT HISTORY:  recurrent BV and yeast,anxiety, celiac disease,  depression, endometriosis, genital warts, IC, CRANIOTOMY 2019,  Sexual abuse: No  BOWEL MOVEMENT: Pain with bowel movement: No Type of bowel movement:Type (Bristol Stool Scale) 4, Frequency daily, and Strain sometimes (does have hemorrhoids) Fully empty rectum: Yes: but has two morning bowel movements  Leakage: No Pads: No Fiber supplement/laxative No  URINATION: Pain with urination: No Fully empty bladder: Yes:   Stream: Strong Urgency: No Frequency: sometimes pending on the day 45 mins - hour Leakage: Sneezing and post void dribble Pads: No  INTERCOURSE:  Ability to have vaginal penetration Yes  Pain with intercourse: After Intercourse Dryness No doesn't feel dry during intercourse but feels sore after Climax: not painful, but was having a little pain at clitoris recently and did have hurt to climax but resolved  Marinoff Scale: 0/3  PREGNANCY: Vaginal deliveries 0  C-section deliveries 0 Currently pregnant No  PROLAPSE: None   OBJECTIVE:  Note: Objective measures were completed at Evaluation unless otherwise noted.  DIAGNOSTIC FINDINGS:    PATIENT SURVEYS:    PFIQ-7: 57 01/27/24 - 48  COGNITION: Overall cognitive status: Within functional limits for tasks assessed     SENSATION: Light touch: Deficits reports tingling and numbness in bil hands/feet that comes and goes - has neuro appointment  LUMBAR SPECIAL TESTS:  Single leg stance test: hip drop noted 5s on Lt stance leg and at 7s Rt stance leg, mild low back pain noted with either side, SI Compression/distraction test: Negative, and FABER test: Negative  FUNCTIONAL TESTS:  Functional squat - decreased descent by 50% and bil valgus noted  GAIT: WFL  POSTURE: rounded shoulders, forward head, and posterior pelvic tilt   LUMBARAROM/PROM:  A/PROM A/PROM  eval  Flexion Limited by 25%  Extension WFL  Right lateral flexion Limited by 25%  Left lateral flexion Limited by 25%  Right rotation Limited  by 25%  Left rotation Limited by 25%   (Blank rows = not tested)  LOWER EXTREMITY ROM:  Bil hamstrings and adductors limited by 25% and tightness noted in bil hip ER and IR.   LOWER EXTREMITY MMT:  Bil hip abduction 3+/5 all other hips grossly 4/5; knees 5/5 PALPATION:   General: tightness noted  in bil lumbar and thoracic paraspinals, bil gluteals  Pelvic Alignment: WFL  Abdominal: mld TTP lower quadrants, fascial restrictions throughout                 External Perineal Exam: decreased mobility at clitoral hood with dryness noted throughout vulva                             Internal Pelvic Floor: TTP throughout bil superficial and deep layers, trigger points noted throughout Rt obturator internus, pubococcygeus and bulbocavernosus   Patient confirms identification and approves PT to assess internal pelvic floor and treatment Yes No emotional/communication barriers or cognitive limitation. Patient is motivated to learn. Patient understands and agrees with treatment goals and plan. PT explains patient will be examined in standing, sitting, and lying down to see how their muscles and joints work. When they are ready, they will be asked to remove their underwear so PT can examine their perineum. The patient is also given the option of providing their own chaperone as one is not provided in our facility. The patient also has the right and is explained the right to defer or refuse any part of the evaluation or treatment including the internal exam. With the patient's consent, PT will use one gloved finger to gently assess the muscles of the pelvic floor, seeing how well it contracts and relaxes and if there is muscle symmetry. After, the patient will get dressed and PT and patient will discuss exam findings and plan of care. PT and patient discuss plan of care, schedule, attendance policy and HEP activities.  PELVIC MMT:   MMT eval 01/27/24  Vaginal 3/5; 8s, 6 reps 4/5  Internal Anal Sphincter     External Anal Sphincter    Puborectalis    Diastasis Recti    (Blank rows = not tested)        TONE: Slightly increased 4/9 - continues to be increased   PROLAPSE: Not seen in hooklying with cough  TODAY'S TREATMENT:                                                                                                                              DATE:   01/13/24:  Manual at RT abdominal quadrants fascial release indirect technique for improved mobility and decreased strain at pelvic floor. With improvement here, progressed with pt consent to internal pelvic floor vaginally with focus at Rt obturator as this is where pt had most of her pain. Pt had several trigger points and TTP with muscle tension here, moderate release but continues to have tension. Then gloves changed and external pelvic floor at Rt obturator manual completed with pt having profound tightness in bil medial glutes and pt stating "that is my pain." Gentle STM completed here and pt tolerated well but unable to fully release.   01/27/24: External pelvic floor manual for improved tissue mobility, decreased trigger points  and improved pain levels with intercourse and urinary frequency - bil obturators with STM and trigger point release work completed hands on with Lt worse than Rt however pt reports she has been working on Rt side more at home.  Patient consented to internal pelvic floor treatment vaginally this date and found to have continued tension at bil obturators and pain with palpation however Lt more than Rt. Trigger point release work completed with several trigger points bil and more at Lt today, tolerated fairly but limited due to pain and tension remaining at end of session.   02/09/24: Trigger Point Dry Needling completed by HELMUS, JITKA   Subsequent Treatment: Instructions provided previously at initial dry needling treatment.   Patient Verbal Consent Given: Yes Education Handout Provided: Yes Muscles Treated: bil  obturator  Electrical Stimulation Performed: No Treatment Response/Outcome: pt reports twitch felt, tension release post treatment and pt demonstrated and reported improved pain with palpation compared to pre dry needling.     External pelvic floor manual for improved tissue mobility, decreased trigger points and improved pain levels with intercourse and urinary frequency - bil obturators with STM and trigger point release work completed hands however not fully released. Pt educated on dry needling and pt very open to attempting, details above, tolerated well and left clinic in good condition. During manual educated on bladder diary and denied questions, handouts given.  Bil piriformis stretch 2x30s, bil single knee to chest 2x30s, double knee to chest x30s, childs pose 2x30s and HEP updated     PATIENT EDUCATION:  Education details: [id number], urge drill  Person educated: Patient Education method: Explanation, Demonstration, Tactile cues, Verbal cues, and Handouts Education comprehension: verbalized understanding, returned demonstration, verbal cues required, tactile cues required, and needs further education  HOME EXERCISE PROGRAM: Access Code: [email or web address] URL: [email or web address] Date: 02/09/2024 Prepared by: Deborah Dondero  Exercises - Seated Diaphragmatic Breathing  - 1 x daily - 7 x weekly - 2 sets - 10 reps - Supine Diaphragmatic Breathing  - 1 x daily - 7 x weekly - 2 sets - 10 reps - Supine Lower Trunk Rotation  - 1 x daily - 7 x weekly - 1 sets - 10 reps - 5s holds - Supine Butterfly Groin Stretch  - 1 x daily - 7 x weekly - 3 sets - 30s hold - Supine Figure 4 Piriformis Stretch  - 1 x daily - 7 x weekly - 1 sets - 3 reps - 30s holds - Supine Single Knee to Chest  - 1 x daily - 7 x weekly - 1 sets - 3 reps - 30s holds - Diaphragmatic Breathing in Child's Pose with Pelvic Floor Relaxation  - 1 x daily - 7 x weekly - 3 sets - 30s hold  ASSESSMENT:  CLINICAL  IMPRESSION: Patient is a 38 y.o. female  who was seen today for physical therapy evaluation and treatment for pelvic pain, vulva pain, increased urinary frequency and leakage. Pt tolerated session well, demonstrates TTP and tension at external pelvic floor. Manual and relaxation techniques completed today for improved tissue mobility and decreased pain. Pt did continue to have tension and TTP at bil obturators and dry needling agreed to. pt did have improvement with symptoms with dry needling and reports she feels less tight. Pleased with progress, understands to self monitor and see if this improves pain. Pt also educated on bladder diary during manual work and given handouts for this.  Pt would benefit from additional PT to further address deficits.  OBJECTIVE IMPAIRMENTS: decreased activity tolerance, decreased coordination, decreased endurance, decreased mobility, decreased strength, increased fascial restrictions, increased muscle spasms, impaired flexibility, improper body mechanics, postural dysfunction, and pain.   ACTIVITY LIMITATIONS: carrying, lifting, standing, squatting, continence, and locomotion level  PARTICIPATION LIMITATIONS: interpersonal relationship and community activity  PERSONAL FACTORS: Fitness, Time since onset of injury/illness/exacerbation, and 1 comorbidity: medical history   are also affecting patient's functional outcome.   REHAB POTENTIAL: Good  CLINICAL DECISION MAKING: Stable/uncomplicated  EVALUATION COMPLEXITY: Low   GOALS: Goals reviewed with patient? Yes  SHORT TERM GOALS: Target date: 01/28/24  Pt to be I with HEP.  Baseline: Goal status: MET  2.  Pt to be I with diaphragmatic breathing and pelvic floor mobility to decreased strain at pelvic floor and pain levels Baseline:  Goal status:  MET  3.  Pt to be I with urge drill to decreased frequency of urine and improved ability to complete work day without frequent bathroom breaks.  Baseline:   Goal status:  MET  4.  Pt to be with knack to decreased urinary incontinence with stressors.  Baseline:  Goal status: on going  LONG TERM GOALS: Target date: 07/02/2024  Pt to be I with advanced HEP.  Baseline:  Goal status: on going  2.  Pt will be able to go 2-3 hours in between voids without urgency or incontinence in order to improve QOL and perform all functional activities with less difficulty.   Baseline:  Goal status: on going  3.  Pt will increase all impaired lumbar A/ROM by 25% without pain.  Baseline:  Goal status: on going  4.  Pt to report improved tolerance to walking at least 30 mins without increased pain for returning healthy exercise levels.  Baseline:  Goal status: on going  5.  Pt to report no more than 1/10 pelvic pain for improved QOL.  Baseline:  Goal status: on going  6.  Pt to report no more than one instance of urinary incontinence in a week for improved QOL.  Baseline:  Goal status: on going  PLAN:  PT FREQUENCY: 2x/week  PT DURATION:  12 sessions  PLANNED INTERVENTIONS: 97110-Therapeutic exercises, 97530- Therapeutic activity, 97112- Neuromuscular re-education, 97535- Self Care, 09811- Manual therapy, Patient/Family education, Balance training, Taping, Dry Needling, Joint mobilization, Spinal mobilization, Scar mobilization, DME instructions, Cryotherapy, Moist heat, and Biofeedback  PLAN FOR NEXT SESSION: internal as needed and pt consents, hip and trunk stretching, core and hip strengthening, voiding mechanics, coordination of pelvic floor    Avie Lemme, PT, DPT 04/22/251:19 PM

## 2024-02-09 NOTE — Patient Instructions (Signed)

## 2024-02-16 ENCOUNTER — Ambulatory Visit: Admitting: Physical Therapy

## 2024-02-16 DIAGNOSIS — R293 Abnormal posture: Secondary | ICD-10-CM

## 2024-02-16 DIAGNOSIS — R279 Unspecified lack of coordination: Secondary | ICD-10-CM

## 2024-02-16 DIAGNOSIS — M62838 Other muscle spasm: Secondary | ICD-10-CM | POA: Diagnosis not present

## 2024-02-16 DIAGNOSIS — M6281 Muscle weakness (generalized): Secondary | ICD-10-CM

## 2024-02-16 NOTE — Therapy (Signed)
 OUTPATIENT PHYSICAL THERAPY FEMALE PELVIC TREATMENT   Patient Name: Mary Wyatt MRN: [id number] DOB:1986-06-02, 38 y.o., female Today's Date: 02/16/2024  END OF SESSION:  PT End of Session - 02/16/24 1150     Visit Number 6    Date for PT Re-Evaluation 07/02/24    Authorization Type healthy blue    Authorization Time Period CARELON APPROVED 5 VISITS #[id number] 01/27/2024-03/26/2024    Authorization - Visit Number 4    Authorization - Number of Visits 5    PT Start Time 1146    PT Stop Time 1227    PT Time Calculation (min) 41 min    Activity Tolerance Patient tolerated treatment well    Behavior During Therapy [facility] for tasks assessed/performed              Past Medical History:  Diagnosis Date   Abdominal pain    Abnormal EKG    Dec 2019 and Jan 2020/ saw cardiologist   Allergy    Altered bowel function    Anemia    Anxiety    Biliary dyskinesia    Celiac disease    Chest tightness    Depression    Dermatitis    Deviated septum    DOE (dyspnea on exertion)    Dysmenorrhea    Endometriosis    Episodic lightheadedness    ETD (Eustachian tube dysfunction), bilateral    Family history of brain aneurysm    Genital warts    GERD (gastroesophageal reflux disease)    Hashimoto's disease    History of meningioma    Hypotension    Insomnia    Interstitial cystitis    Meningioma (HCC)    Mononucleosis    Pelvic pain    Perineal pain    Post-operative nausea and vomiting    Rhinitis    [facility] spotted fever) 2015   S/P resection of meningioma    Seasonal allergic rhinitis    Tendinitis    Ulcer    Unspecified dyspareunia    Urethral syndrome    Vitamin D  deficiency    Past Surgical History:  Procedure Laterality Date   COLONOSCOPY  2013   CRANIOTOMY  09/2018   laproscopic surgery     2013 and 2019   OVARIAN CYST REMOVAL     TONSILLECTOMY     UPPER GASTROINTESTINAL ENDOSCOPY  2013   Patient Active Problem List   Diagnosis Date  Noted   Hypotension - chronic 09/19/2021   Anxiety and depression 06/15/2017   Family history of brain aneurysm 04/19/2017   Rhinitis, allergic 04/20/2014   Generalized anxiety disorder 04/20/2014   Celiac disease 12/15/2012   Interstitial cystitis     PCP: Audria Leather, MD   REFERRING PROVIDER: Audria Leather, MD   REFERRING DIAG: [phone] (ICD-10-CM) - Vulvodynia N34.3 (ICD-10-CM) - Urethral syndrome R10.2 (ICD-10-CM) - Vaginal pain  THERAPY DIAG:  Other muscle spasm  Muscle weakness (generalized)  Abnormal posture  Unspecified lack of coordination  Rationale for Evaluation and Treatment: Rehabilitation  ONSET DATE: 1.5 years ago  SUBJECTIVE:  SUBJECTIVE STATEMENT: Pt reports "I have been less aware of any pain and I have been walking more without pain."   Didn't feel like I had a problem with urine this week without leakage or increased urgency.   Had the house to herself and able to use vibrator a few times during the week which lowered pain and had less stress, also thinks dry needling helped.   Fluid intake: water - 70 oz, coffee in am, black tea in afternoon, sometimes V8.   PAIN:  Are you having pain? Yes NPRS scale: 0/10 Pain location:  vaginal area  Pain type: burning Pain description: intermittent and hours to days    Aggravating factors: alcohol made worse, unsure what else Relieving factors: sometimes estrogen cream/BV treatment but not always  Low Back pain - 6/10 constant, spasm/tightness  PRECAUTIONS: None  RED FLAGS: None   WEIGHT BEARING RESTRICTIONS: No  FALLS:  Has patient fallen in last 6 months? No  OCCUPATION: not currently   ACTIVITY LEVEL : active at home but less exercise.   PLOF: Independent  PATIENT GOALS: to be more comfortable  and no urine leakage   PERTINENT HISTORY:  recurrent BV and yeast,anxiety, celiac disease, depression, endometriosis, genital warts, IC, CRANIOTOMY 2019,  Sexual abuse: No  BOWEL MOVEMENT: Pain with bowel movement: No Type of bowel movement:Type (Bristol Stool Scale) 4, Frequency daily, and Strain sometimes (does have hemorrhoids) Fully empty rectum: Yes: but has two morning bowel movements  Leakage: No Pads: No Fiber supplement/laxative No  URINATION: Pain with urination: No Fully empty bladder: Yes:   Stream: Strong Urgency: No Frequency: sometimes pending on the day 45 mins - hour Leakage: Sneezing and post void dribble Pads: No  INTERCOURSE:  Ability to have vaginal penetration Yes  Pain with intercourse: After Intercourse Dryness No doesn't feel dry during intercourse but feels sore after Climax: not painful, but was having a little pain at clitoris recently and did have hurt to climax but resolved  Marinoff Scale: 0/3  PREGNANCY: Vaginal deliveries 0  C-section deliveries 0 Currently pregnant No  PROLAPSE: None   OBJECTIVE:  Note: Objective measures were completed at Evaluation unless otherwise noted.  DIAGNOSTIC FINDINGS:    PATIENT SURVEYS:    PFIQ-7: 57 01/27/24 - 48  COGNITION: Overall cognitive status: Within functional limits for tasks assessed     SENSATION: Light touch: Deficits reports tingling and numbness in bil hands/feet that comes and goes - has neuro appointment  LUMBAR SPECIAL TESTS:  Single leg stance test: hip drop noted 5s on Lt stance leg and at 7s Rt stance leg, mild low back pain noted with either side, SI Compression/distraction test: Negative, and FABER test: Negative  FUNCTIONAL TESTS:  Functional squat - decreased descent by 50% and bil valgus noted  GAIT: WFL  POSTURE: rounded shoulders, forward head, and posterior pelvic tilt   LUMBARAROM/PROM:  A/PROM A/PROM  eval  Flexion Limited by 25%  Extension WFL  Right  lateral flexion Limited by 25%  Left lateral flexion Limited by 25%  Right rotation Limited by 25%  Left rotation Limited by 25%   (Blank rows = not tested)  LOWER EXTREMITY ROM:  Bil hamstrings and adductors limited by 25% and tightness noted in bil hip ER and IR.   LOWER EXTREMITY MMT:  Bil hip abduction 3+/5 all other hips grossly 4/5; knees 5/5 PALPATION:   General: tightness noted in bil lumbar and thoracic paraspinals, bil gluteals  Pelvic Alignment: WFL  Abdominal: mld TTP  lower quadrants, fascial restrictions throughout                 External Perineal Exam: decreased mobility at clitoral hood with dryness noted throughout vulva                             Internal Pelvic Floor: TTP throughout bil superficial and deep layers, trigger points noted throughout Rt obturator internus, pubococcygeus and bulbocavernosus   Patient confirms identification and approves PT to assess internal pelvic floor and treatment Yes No emotional/communication barriers or cognitive limitation. Patient is motivated to learn. Patient understands and agrees with treatment goals and plan. PT explains patient will be examined in standing, sitting, and lying down to see how their muscles and joints work. When they are ready, they will be asked to remove their underwear so PT can examine their perineum. The patient is also given the option of providing their own chaperone as one is not provided in our facility. The patient also has the right and is explained the right to defer or refuse any part of the evaluation or treatment including the internal exam. With the patient's consent, PT will use one gloved finger to gently assess the muscles of the pelvic floor, seeing how well it contracts and relaxes and if there is muscle symmetry. After, the patient will get dressed and PT and patient will discuss exam findings and plan of care. PT and patient discuss plan of care, schedule, attendance policy and HEP  activities.  PELVIC MMT:   MMT eval 01/27/24  Vaginal 3/5; 8s, 6 reps 4/5  Internal Anal Sphincter    External Anal Sphincter    Puborectalis    Diastasis Recti    (Blank rows = not tested)        TONE: Slightly increased 4/9 - continues to be increased   PROLAPSE: Not seen in hooklying with cough  TODAY'S TREATMENT:                                                                                                                              DATE:    01/27/24: External pelvic floor manual for improved tissue mobility, decreased trigger points and improved pain levels with intercourse and urinary frequency - bil obturators with STM and trigger point release work completed hands on with Lt worse than Rt however pt reports she has been working on Rt side more at home.  Patient consented to internal pelvic floor treatment vaginally this date and found to have continued tension at bil obturators and pain with palpation however Lt more than Rt. Trigger point release work completed with several trigger points bil and more at Lt today, tolerated fairly but limited due to pain and tension remaining at end of session.   02/09/24: Trigger Point Dry Needling completed by HELMUS, JITKA   Subsequent Treatment: Instructions provided previously at initial dry needling treatment.  Patient Verbal Consent Given: Yes Education Handout Provided: Yes Muscles Treated: bil obturator  Electrical Stimulation Performed: No Treatment Response/Outcome: pt reports twitch felt, tension release post treatment and pt demonstrated and reported improved pain with palpation compared to pre dry needling.     External pelvic floor manual for improved tissue mobility, decreased trigger points and improved pain levels with intercourse and urinary frequency - bil obturators with STM and trigger point release work completed hands however not fully released. Pt educated on dry needling and pt very open to attempting, details  above, tolerated well and left clinic in good condition. During manual educated on bladder diary and denied questions, handouts given.  Bil piriformis stretch 2x30s, bil single knee to chest 2x30s, double knee to chest x30s, childs pose 2x30s and HEP updated   02/16/24: Earline Glenn pose 2x30s Foam rolling glutes x10, foam roller bil piriformis x10 each Transverse abdominis activation in hooklying max mutli modal cues for activation Transverse abdominis activation 3x10 with ball squeeze Hooklying opp hand/knee ball press 2x10 Pt educated on continued use of pelvic wand/vibrating wand 2-3x weekly for mobility and tissue relaxation for decreased pain.     PATIENT EDUCATION:  Education details: [id number], urge drill  Person educated: Patient Education method: Explanation, Demonstration, Tactile cues, Verbal cues, and Handouts Education comprehension: verbalized understanding, returned demonstration, verbal cues required, tactile cues required, and needs further education  HOME EXERCISE PROGRAM: Access Code: [email or web address] URL: [email or web address] Date: 02/09/2024 Prepared by: Kadince Boxley   Exercises - Seated Diaphragmatic Breathing  - 1 x daily - 7 x weekly - 2 sets - 10 reps - Supine Diaphragmatic Breathing  - 1 x daily - 7 x weekly - 2 sets - 10 reps - Supine Lower Trunk Rotation  - 1 x daily - 7 x weekly - 1 sets - 10 reps - 5s holds - Supine Butterfly Groin Stretch  - 1 x daily - 7 x weekly - 3 sets - 30s hold - Supine Figure 4 Piriformis Stretch  - 1 x daily - 7 x weekly - 1 sets - 3 reps - 30s holds - Supine Single Knee to Chest  - 1 x daily - 7 x weekly - 1 sets - 3 reps - 30s holds - Diaphragmatic Breathing in Child's Pose with Pelvic Floor Relaxation  - 1 x daily - 7 x weekly - 3 sets - 30s hold - Figure 4 Gluteus Mobilization on Foam Roll  - 1 x daily - 7 x weekly - 1 sets - 10 reps  ASSESSMENT:  CLINICAL IMPRESSION: Patient is a 38 y.o. female  who was seen today for  physical therapy evaluation and treatment for pelvic pain, vulva pain, increased urinary frequency and leakage. Pt tolerated session well, denied increased pain during session and progressed today to more core strengthening beginner exercises with pt needing max cues and extra time to achieve activation of transverse abdominis and to maintain with exercises.  Pt would benefit from additional PT to further address deficits.    OBJECTIVE IMPAIRMENTS: decreased activity tolerance, decreased coordination, decreased endurance, decreased mobility, decreased strength, increased fascial restrictions, increased muscle spasms, impaired flexibility, improper body mechanics, postural dysfunction, and pain.   ACTIVITY LIMITATIONS: carrying, lifting, standing, squatting, continence, and locomotion level  PARTICIPATION LIMITATIONS: interpersonal relationship and community activity  PERSONAL FACTORS: Fitness, Time since onset of injury/illness/exacerbation, and 1 comorbidity: medical history   are also affecting patient's functional outcome.   REHAB POTENTIAL: Good  CLINICAL DECISION MAKING: Stable/uncomplicated  EVALUATION COMPLEXITY: Low  GOALS: Goals reviewed with patient? Yes  SHORT TERM GOALS: Target date: 01/28/24  Pt to be I with HEP.  Baseline: Goal status: MET  2.  Pt to be I with diaphragmatic breathing and pelvic floor mobility to decreased strain at pelvic floor and pain levels Baseline:  Goal status:  MET  3.  Pt to be I with urge drill to decreased frequency of urine and improved ability to complete work day without frequent bathroom breaks.  Baseline:  Goal status:  MET  4.  Pt to be with knack to decreased urinary incontinence with stressors.  Baseline:  Goal status: MET 4/29  LONG TERM GOALS: Target date: 07/02/2024  Pt to be I with advanced HEP.  Baseline:  Goal status: MET 4/29  2.  Pt will be able to go 2-3 hours in between voids without urgency or incontinence in order  to improve QOL and perform all functional activities with less difficulty.   Baseline:  Goal status: on going  3.  Pt will increase all impaired lumbar A/ROM by 25% without pain.  Baseline:  Goal status: on going  4.  Pt to report improved tolerance to walking at least 30 mins without increased pain for returning healthy exercise levels.  Baseline:  Goal status: on going  5.  Pt to report no more than 1/10 pelvic pain for improved QOL.  Baseline:  Goal status: on going  6.  Pt to report no more than one instance of urinary incontinence in a week for improved QOL.  Baseline:  Goal status: MET 4/29  PLAN:  PT FREQUENCY: 2x/week  PT DURATION:  12 sessions  PLANNED INTERVENTIONS: 97110-Therapeutic exercises, 97530- Therapeutic activity, 97112- Neuromuscular re-education, 97535- Self Care, 16109- Manual therapy, Patient/Family education, Balance training, Taping, Dry Needling, Joint mobilization, Spinal mobilization, Scar mobilization, DME instructions, Cryotherapy, Moist heat, and Biofeedback  PLAN FOR NEXT SESSION: internal as needed and pt consents, hip and trunk stretching, core and hip strengthening, voiding mechanics, coordination of pelvic floor    Avie Lemme, PT, DPT 02/16/2511:34 PM

## 2024-02-23 NOTE — Telephone Encounter (Signed)
 Routing to [place]  Cc: Mariah Shines, [place]

## 2024-02-24 ENCOUNTER — Ambulatory Visit: Attending: Family Medicine | Admitting: Physical Therapy

## 2024-02-24 DIAGNOSIS — R279 Unspecified lack of coordination: Secondary | ICD-10-CM | POA: Insufficient documentation

## 2024-02-24 DIAGNOSIS — M62838 Other muscle spasm: Secondary | ICD-10-CM | POA: Insufficient documentation

## 2024-02-24 DIAGNOSIS — M6281 Muscle weakness (generalized): Secondary | ICD-10-CM | POA: Insufficient documentation

## 2024-02-24 DIAGNOSIS — R293 Abnormal posture: Secondary | ICD-10-CM | POA: Diagnosis present

## 2024-02-24 NOTE — Therapy (Addendum)
 OUTPATIENT PHYSICAL THERAPY FEMALE PELVIC TREATMENT   Patient Name: Mary Wyatt MRN: [id number] DOB:April 16, 1986, 38 y.o., female Today's Date: 02/24/2024  END OF SESSION:  PT End of Session - 02/24/24 0848     Visit Number 7    Date for PT Re-Evaluation 07/02/24    Authorization Type healthy blue    Authorization Time Period CARELON APPROVED 5 VISITS #[id number] 01/27/2024-03/26/2024    Authorization - Visit Number 4    Authorization - Number of Visits 5    PT Start Time 0846    PT Stop Time 0926    PT Time Calculation (min) 40 min    Activity Tolerance Patient tolerated treatment well    Behavior During Therapy [facility] for tasks assessed/performed              Past Medical History:  Diagnosis Date   Abdominal pain    Abnormal EKG    Dec 2019 and Jan 2020/ saw cardiologist   Allergy    Altered bowel function    Anemia    Anxiety    Biliary dyskinesia    Celiac disease    Chest tightness    Depression    Dermatitis    Deviated septum    DOE (dyspnea on exertion)    Dysmenorrhea    Endometriosis    Episodic lightheadedness    ETD (Eustachian tube dysfunction), bilateral    Family history of brain aneurysm    Genital warts    GERD (gastroesophageal reflux disease)    Hashimoto's disease    History of meningioma    Hypotension    Insomnia    Interstitial cystitis    Meningioma (HCC)    Mononucleosis    Pelvic pain    Perineal pain    Post-operative nausea and vomiting    Rhinitis    [facility] spotted fever) 2015   S/P resection of meningioma    Seasonal allergic rhinitis    Tendinitis    Ulcer    Unspecified dyspareunia    Urethral syndrome    Vitamin D  deficiency    Past Surgical History:  Procedure Laterality Date   COLONOSCOPY  2013   CRANIOTOMY  09/2018   laproscopic surgery     2013 and 2019   OVARIAN CYST REMOVAL     TONSILLECTOMY     UPPER GASTROINTESTINAL ENDOSCOPY  2013   Patient Active Problem List   Diagnosis Date  Noted   Hypotension - chronic 09/19/2021   Anxiety and depression 06/15/2017   Family history of brain aneurysm 04/19/2017   Rhinitis, allergic 04/20/2014   Generalized anxiety disorder 04/20/2014   Celiac disease 12/15/2012   Interstitial cystitis     PCP: Audria Leather, MD   REFERRING PROVIDER: Audria Leather, MD   REFERRING DIAG: [phone] (ICD-10-CM) - Vulvodynia N34.3 (ICD-10-CM) - Urethral syndrome R10.2 (ICD-10-CM) - Vaginal pain  THERAPY DIAG:  Other muscle spasm  Muscle weakness (generalized)  Abnormal posture  Rationale for Evaluation and Treatment: Rehabilitation  ONSET DATE: 1.5 years ago  SUBJECTIVE:  SUBJECTIVE STATEMENT: Reports she did have several (maybe 10) instances of post void dribbling again. Pain has been much better this week and did have a little back pain but relieved with bowel movement. And was able to use massager and this has been helping.   Fluid intake: water - 70 oz, coffee in am, black tea in afternoon, sometimes V8.   PAIN:  no 5/7 Are you having pain? Yes NPRS scale: 0/10 Pain location:  vaginal area  Pain type: burning Pain description: intermittent and hours to days    Aggravating factors: alcohol made worse, unsure what else Relieving factors: sometimes estrogen cream/BV treatment but not always  Low Back pain - 6/10 constant, spasm/tightness  PRECAUTIONS: None  RED FLAGS: None   WEIGHT BEARING RESTRICTIONS: No  FALLS:  Has patient fallen in last 6 months? No  OCCUPATION: not currently   ACTIVITY LEVEL : active at home but less exercise.   PLOF: Independent  PATIENT GOALS: to be more comfortable and no urine leakage   PERTINENT HISTORY:  recurrent BV and yeast,anxiety, celiac disease, depression, endometriosis, genital  warts, IC, CRANIOTOMY 2019,  Sexual abuse: No  BOWEL MOVEMENT: Pain with bowel movement: No Type of bowel movement:Type (Bristol Stool Scale) 4, Frequency daily, and Strain sometimes (does have hemorrhoids) Fully empty rectum: Yes: but has two morning bowel movements  Leakage: No Pads: No Fiber supplement/laxative No  URINATION: Pain with urination: No Fully empty bladder: Yes:   Stream: Strong Urgency: No Frequency: sometimes pending on the day 45 mins - hour Leakage: Sneezing and post void dribble Pads: No  INTERCOURSE:  Ability to have vaginal penetration Yes  Pain with intercourse: After Intercourse Dryness No doesn't feel dry during intercourse but feels sore after Climax: not painful, but was having a little pain at clitoris recently and did have hurt to climax but resolved  Marinoff Scale: 0/3  PREGNANCY: Vaginal deliveries 0  C-section deliveries 0 Currently pregnant No  PROLAPSE: None   OBJECTIVE:  Note: Objective measures were completed at Evaluation unless otherwise noted.  DIAGNOSTIC FINDINGS:    PATIENT SURVEYS:    PFIQ-7: 57 01/27/24 - 48 02/24/24: 38  COGNITION: Overall cognitive status: Within functional limits for tasks assessed     SENSATION: Light touch: Deficits reports tingling and numbness in bil hands/feet that comes and goes - has neuro appointment  LUMBAR SPECIAL TESTS:  Single leg stance test: hip drop noted 5s on Lt stance leg and at 7s Rt stance leg, mild low back pain noted with either side, SI Compression/distraction test: Negative, and FABER test: Negative  FUNCTIONAL TESTS:  Functional squat - decreased descent by 50% and bil valgus noted  GAIT: WFL  POSTURE: rounded shoulders, forward head, and posterior pelvic tilt   LUMBARAROM/PROM:  A/PROM A/PROM  eval  Flexion Limited by 25%  Extension WFL  Right lateral flexion Limited by 25%  Left lateral flexion Limited by 25%  Right rotation Limited by 25%  Left rotation  Limited by 25%   (Blank rows = not tested)  LOWER EXTREMITY ROM:  Bil hamstrings and adductors limited by 25% and tightness noted in bil hip ER and IR.   LOWER EXTREMITY MMT:  Bil hip abduction 3+/5 all other hips grossly 4/5; knees 5/5 PALPATION:   General: tightness noted in bil lumbar and thoracic paraspinals, bil gluteals  Pelvic Alignment: WFL  Abdominal: mld TTP lower quadrants, fascial restrictions throughout  External Perineal Exam: decreased mobility at clitoral hood with dryness noted throughout vulva                             Internal Pelvic Floor: TTP throughout bil superficial and deep layers, trigger points noted throughout Rt obturator internus, pubococcygeus and bulbocavernosus   Patient confirms identification and approves PT to assess internal pelvic floor and treatment Yes No emotional/communication barriers or cognitive limitation. Patient is motivated to learn. Patient understands and agrees with treatment goals and plan. PT explains patient will be examined in standing, sitting, and lying down to see how their muscles and joints work. When they are ready, they will be asked to remove their underwear so PT can examine their perineum. The patient is also given the option of providing their own chaperone as one is not provided in our facility. The patient also has the right and is explained the right to defer or refuse any part of the evaluation or treatment including the internal exam. With the patient's consent, PT will use one gloved finger to gently assess the muscles of the pelvic floor, seeing how well it contracts and relaxes and if there is muscle symmetry. After, the patient will get dressed and PT and patient will discuss exam findings and plan of care. PT and patient discuss plan of care, schedule, attendance policy and HEP activities.  PELVIC MMT:   MMT eval 01/27/24  Vaginal 3/5; 8s, 6 reps 4/5  Internal Anal Sphincter    External Anal  Sphincter    Puborectalis    Diastasis Recti    (Blank rows = not tested)        TONE: Slightly increased 4/9 - continues to be increased   PROLAPSE: Not seen in hooklying with cough  TODAY'S TREATMENT:                                                                                                                              DATE:    01/27/24: External pelvic floor manual for improved tissue mobility, decreased trigger points and improved pain levels with intercourse and urinary frequency - bil obturators with STM and trigger point release work completed hands on with Lt worse than Rt however pt reports she has been working on Rt side more at home.  Patient consented to internal pelvic floor treatment vaginally this date and found to have continued tension at bil obturators and pain with palpation however Lt more than Rt. Trigger point release work completed with several trigger points bil and more at Lt today, tolerated fairly but limited due to pain and tension remaining at end of session.   02/09/24: Trigger Point Dry Needling completed by HELMUS, JITKA   Subsequent Treatment: Instructions provided previously at initial dry needling treatment.   Patient Verbal Consent Given: Yes Education Handout Provided: Yes Muscles Treated: bil obturator  Electrical Stimulation Performed: No Treatment Response/Outcome: pt  reports twitch felt, tension release post treatment and pt demonstrated and reported improved pain with palpation compared to pre dry needling.     External pelvic floor manual for improved tissue mobility, decreased trigger points and improved pain levels with intercourse and urinary frequency - bil obturators with STM and trigger point release work completed hands however not fully released. Pt educated on dry needling and pt very open to attempting, details above, tolerated well and left clinic in good condition. During manual educated on bladder diary and denied questions,  handouts given.  Bil piriformis stretch 2x30s, bil single knee to chest 2x30s, double knee to chest x30s, childs pose 2x30s and HEP updated   02/16/24: Earline Glenn pose 2x30s Foam rolling glutes x10, foam roller bil piriformis x10 each Transverse abdominis activation in hooklying max mutli modal cues for activation Transverse abdominis activation 3x10 with ball squeeze Hooklying opp hand/knee ball press 2x10 Pt educated on continued use of pelvic wand/vibrating wand 2-3x weekly for mobility and tissue relaxation for decreased pain.   02/24/24 Half roller - horizontal thoracic extension in hooklying x10 wide claps, x10 angel wings Half roller - vertical thoracic extension x10 wide claps Lumbar rotations x10 Butterfly 3x30s with pelvic drops  Hip abduction green band 2x10 Opp hand/knee ball press 2x10 Hooklying alt marching green band 2x10 X5 Sit to stand single legs with kick stand Modified pigeon pose at mat table 2x30s each  PATIENT EDUCATION:  Education details: [id number], urge drill  Person educated: Patient Education method: Explanation, Demonstration, Tactile cues, Verbal cues, and Handouts Education comprehension: verbalized understanding, returned demonstration, verbal cues required, tactile cues required, and needs further education  HOME EXERCISE PROGRAM: Access Code: [email or web address] URL: [email or web address] Date: 02/09/2024 Prepared by: Lennix Rotundo   Exercises - Seated Diaphragmatic Breathing  - 1 x daily - 7 x weekly - 2 sets - 10 reps - Supine Diaphragmatic Breathing  - 1 x daily - 7 x weekly - 2 sets - 10 reps - Supine Lower Trunk Rotation  - 1 x daily - 7 x weekly - 1 sets - 10 reps - 5s holds - Supine Butterfly Groin Stretch  - 1 x daily - 7 x weekly - 3 sets - 30s hold - Supine Figure 4 Piriformis Stretch  - 1 x daily - 7 x weekly - 1 sets - 3 reps - 30s holds - Supine Single Knee to Chest  - 1 x daily - 7 x weekly - 1 sets - 3 reps - 30s holds - Diaphragmatic  Breathing in Child's Pose with Pelvic Floor Relaxation  - 1 x daily - 7 x weekly - 3 sets - 30s hold - Figure 4 Gluteus Mobilization on Foam Roll  - 1 x daily - 7 x weekly - 1 sets - 10 reps  ASSESSMENT:  CLINICAL IMPRESSION: Patient is a 38 y.o. female  who was seen today for physical therapy evaluation and treatment for pelvic pain, vulva pain, increased urinary frequency and leakage. Pt tolerated session well, denied increased pain during session and progressed today to more core and hip strengthening exercises with pt needing mod cues for coordination and activation of pelvic floor and transverse abdominis  and  to achieve activation of transverse abdominis and to maintain with exercises. Pt would benefit from additional PT to further address deficits as she is progressing well as evident by functional symptoms decreasing and PFIQ-7 scoring and now that she is having much less pain she can tolerate more strengthening exercises for less urinary incontinence  and more pelvic stability for better skin integrity, no wetting clothing/underwear, and tolerate returning to working out .    OBJECTIVE IMPAIRMENTS: decreased activity tolerance, decreased coordination, decreased endurance, decreased mobility, decreased strength, increased fascial restrictions, increased muscle spasms, impaired flexibility, improper body mechanics, postural dysfunction, and pain.   ACTIVITY LIMITATIONS: carrying, lifting, standing, squatting, continence, and locomotion level  PARTICIPATION LIMITATIONS: interpersonal relationship and community activity  PERSONAL FACTORS: Fitness, Time since onset of injury/illness/exacerbation, and 1 comorbidity: medical history   are also affecting patient's functional outcome.   REHAB POTENTIAL: Good  CLINICAL DECISION MAKING: Stable/uncomplicated  EVALUATION COMPLEXITY: Low   GOALS: Goals reviewed with patient? Yes  SHORT TERM GOALS: Target date: 01/28/24  Pt to be I with HEP.   Baseline: Goal status: MET  2.  Pt to be I with diaphragmatic breathing and pelvic floor mobility to decreased strain at pelvic floor and pain levels Baseline:  Goal status:  MET  3.  Pt to be I with urge drill to decreased frequency of urine and improved ability to complete work day without frequent bathroom breaks.  Baseline:  Goal status:  MET  4.  Pt to be with knack to decreased urinary incontinence with stressors.  Baseline:  Goal status: MET 4/29  LONG TERM GOALS: Target date: 07/02/2024  Pt to be I with advanced HEP.  Baseline:  Goal status: MET 4/29  2.  Pt will be able to go 2-3 hours in between voids without urgency or incontinence in order to improve QOL and perform all functional activities with less difficulty.   Baseline:  Goal status: on going for consistency  3.  Pt will increase all impaired lumbar A/ROM by 25% without pain.  Baseline:  Goal status: MET 5/7  4.  Pt to report improved tolerance to walking at least 30 mins without increased pain for returning healthy exercise levels.  Baseline:  Goal status: MET 5/7  5.  Pt to report no more than 1/10 pelvic pain for improved QOL.  Baseline:  Goal status: on going for consistency  6.  Pt to report no more than one instance of urinary incontinence in a week for improved QOL.  Baseline:  Goal status: MET 4/29  PLAN:  PT FREQUENCY: 2x/week  PT DURATION:  12 sessions  PLANNED INTERVENTIONS: 97110-Therapeutic exercises, 97530- Therapeutic activity, 97112- Neuromuscular re-education, 97535- Self Care, 16109- Manual therapy, Patient/Family education, Balance training, Taping, Dry Needling, Joint mobilization, Spinal mobilization, Scar mobilization, DME instructions, Cryotherapy, Moist heat, and Biofeedback  PLAN FOR NEXT SESSION: internal as needed and pt consents, hip and trunk stretching, core and hip strengthening, voiding mechanics, coordination of pelvic floor    Avie Lemme, PT, DPT 05/07/259:32  AM

## 2024-02-25 ENCOUNTER — Other Ambulatory Visit: Payer: Self-pay | Admitting: Radiology

## 2024-02-25 DIAGNOSIS — N761 Subacute and chronic vaginitis: Secondary | ICD-10-CM

## 2024-02-25 MED ORDER — ESTRADIOL 0.1 MG/GM VA CREA: 1.0000 g | TOPICAL_CREAM | VAGINAL | 3 refills | Status: AC

## 2024-02-29 ENCOUNTER — Encounter: Admitting: Physical Therapy

## 2024-03-01 ENCOUNTER — Ambulatory Visit: Admitting: Physical Therapy

## 2024-03-01 DIAGNOSIS — M6281 Muscle weakness (generalized): Secondary | ICD-10-CM

## 2024-03-01 DIAGNOSIS — R279 Unspecified lack of coordination: Secondary | ICD-10-CM

## 2024-03-01 DIAGNOSIS — R293 Abnormal posture: Secondary | ICD-10-CM

## 2024-03-01 DIAGNOSIS — M62838 Other muscle spasm: Secondary | ICD-10-CM

## 2024-03-01 NOTE — Therapy (Signed)
 OUTPATIENT PHYSICAL THERAPY FEMALE PELVIC TREATMENT   Patient Name: Mary Wyatt MRN: [id number] DOB:09/09/1986, 38 y.o., female Today's Date: 03/01/2024  END OF SESSION:  PT End of Session - 03/01/24 1533     Visit Number 8    Date for PT Re-Evaluation 07/02/24    Authorization Type healthy blue    Authorization Time Period CARELON APPROVED 5 VISITS #[id number] 01/27/2024-03/26/2024    Authorization - Visit Number 5    Authorization - Number of Visits 5    PT Start Time 1530    PT Stop Time 1619    PT Time Calculation (min) 49 min    Activity Tolerance Patient tolerated treatment well    Behavior During Therapy [facility] for tasks assessed/performed              Past Medical History:  Diagnosis Date   Abdominal pain    Abnormal EKG    Dec 2019 and Jan 2020/ saw cardiologist   Allergy    Altered bowel function    Anemia    Anxiety    Biliary dyskinesia    Celiac disease    Chest tightness    Depression    Dermatitis    Deviated septum    DOE (dyspnea on exertion)    Dysmenorrhea    Endometriosis    Episodic lightheadedness    ETD (Eustachian tube dysfunction), bilateral    Family history of brain aneurysm    Genital warts    GERD (gastroesophageal reflux disease)    Hashimoto's disease    History of meningioma    Hypotension    Insomnia    Interstitial cystitis    Meningioma (HCC)    Mononucleosis    Pelvic pain    Perineal pain    Post-operative nausea and vomiting    Rhinitis    [facility] spotted fever) 2015   S/P resection of meningioma    Seasonal allergic rhinitis    Tendinitis    Ulcer    Unspecified dyspareunia    Urethral syndrome    Vitamin D  deficiency    Past Surgical History:  Procedure Laterality Date   COLONOSCOPY  2013   CRANIOTOMY  09/2018   laproscopic surgery     2013 and 2019   OVARIAN CYST REMOVAL     TONSILLECTOMY     UPPER GASTROINTESTINAL ENDOSCOPY  2013   Patient Active Problem List   Diagnosis Date  Noted   Hypotension - chronic 09/19/2021   Anxiety and depression 06/15/2017   Family history of brain aneurysm 04/19/2017   Rhinitis, allergic 04/20/2014   Generalized anxiety disorder 04/20/2014   Celiac disease 12/15/2012   Interstitial cystitis     PCP: Audria Leather, MD   REFERRING PROVIDER: Audria Leather, MD   REFERRING DIAG: [phone] (ICD-10-CM) - Vulvodynia N34.3 (ICD-10-CM) - Urethral syndrome R10.2 (ICD-10-CM) - Vaginal pain  THERAPY DIAG:  Other muscle spasm  Muscle weakness (generalized)  Abnormal posture  Unspecified lack of coordination  Rationale for Evaluation and Treatment: Rehabilitation  ONSET DATE: 1.5 years ago  SUBJECTIVE:  SUBJECTIVE STATEMENT: Did have a little return of Lt obturator pain, and pain with climax. Dribble of urine is getting better only happened a handful of times.  Will randomly have a spasm/sharp pain at vagina but without pattern and happens maybe 3x per week   Fluid intake: water - 70 oz, coffee in am, black tea in afternoon, sometimes V8.   PAIN:  5/13 Are you having pain? Yes NPRS scale: 2/10 Pain location: back  Pain type: dull achy Pain description: intermittent and     Aggravating factors: standing for 20 mins Relieving factors: mobility   PRECAUTIONS: None  RED FLAGS: None   WEIGHT BEARING RESTRICTIONS: No  FALLS:  Has patient fallen in last 6 months? No  OCCUPATION: not currently   ACTIVITY LEVEL : active at home but less exercise.   PLOF: Independent  PATIENT GOALS: to be more comfortable and no urine leakage   PERTINENT HISTORY:  recurrent BV and yeast,anxiety, celiac disease, depression, endometriosis, genital warts, IC, CRANIOTOMY 2019,  Sexual abuse: No  BOWEL MOVEMENT: Pain with bowel movement:  No Type of bowel movement:Type (Bristol Stool Scale) 4, Frequency daily, and Strain sometimes (does have hemorrhoids) Fully empty rectum: Yes: but has two morning bowel movements  Leakage: No Pads: No Fiber supplement/laxative No  URINATION: Pain with urination: No Fully empty bladder: Yes:   Stream: Strong Urgency: No Frequency: sometimes pending on the day 45 mins - hour Leakage: Sneezing and post void dribble Pads: No  INTERCOURSE:  Ability to have vaginal penetration Yes  Pain with intercourse: After Intercourse Dryness No doesn't feel dry during intercourse but feels sore after Climax: not painful, but was having a little pain at clitoris recently and did have hurt to climax but resolved  Marinoff Scale: 0/3  PREGNANCY: Vaginal deliveries 0  C-section deliveries 0 Currently pregnant No  PROLAPSE: None   OBJECTIVE:  Note: Objective measures were completed at Evaluation unless otherwise noted.  DIAGNOSTIC FINDINGS:    PATIENT SURVEYS:    PFIQ-7: 57 01/27/24 - 48 02/24/24: 38 03/01/24: 38 COGNITION: Overall cognitive status: Within functional limits for tasks assessed     SENSATION: Light touch: Deficits reports tingling and numbness in bil hands/feet that comes and goes - has neuro appointment  LUMBAR SPECIAL TESTS:  Single leg stance test: hip drop noted 5s on Lt stance leg and at 7s Rt stance leg, mild low back pain noted with either side, SI Compression/distraction test: Negative, and FABER test: Negative  FUNCTIONAL TESTS:  Functional squat - decreased descent by 50% and bil valgus noted  GAIT: WFL  POSTURE: rounded shoulders, forward head, and posterior pelvic tilt   LUMBARAROM/PROM:  A/PROM A/PROM  eval  Flexion Limited by 25%  Extension WFL  Right lateral flexion Limited by 25%  Left lateral flexion Limited by 25%  Right rotation Limited by 25%  Left rotation Limited by 25%   (Blank rows = not tested)  LOWER EXTREMITY ROM:  Bil  hamstrings and adductors limited by 25% and tightness noted in bil hip ER and IR.   LOWER EXTREMITY MMT:  Bil hip abduction 3+/5 all other hips grossly 4/5; knees 5/5 PALPATION:   General: tightness noted in bil lumbar and thoracic paraspinals, bil gluteals  Pelvic Alignment: WFL  Abdominal: mld TTP lower quadrants, fascial restrictions throughout                 External Perineal Exam: decreased mobility at clitoral hood with dryness noted throughout vulva  Internal Pelvic Floor: TTP throughout bil superficial and deep layers, trigger points noted throughout Rt obturator internus, pubococcygeus and bulbocavernosus   Patient confirms identification and approves PT to assess internal pelvic floor and treatment Yes No emotional/communication barriers or cognitive limitation. Patient is motivated to learn. Patient understands and agrees with treatment goals and plan. PT explains patient will be examined in standing, sitting, and lying down to see how their muscles and joints work. When they are ready, they will be asked to remove their underwear so PT can examine their perineum. The patient is also given the option of providing their own chaperone as one is not provided in our facility. The patient also has the right and is explained the right to defer or refuse any part of the evaluation or treatment including the internal exam. With the patient's consent, PT will use one gloved finger to gently assess the muscles of the pelvic floor, seeing how well it contracts and relaxes and if there is muscle symmetry. After, the patient will get dressed and PT and patient will discuss exam findings and plan of care. PT and patient discuss plan of care, schedule, attendance policy and HEP activities.  PELVIC MMT:   MMT eval 01/27/24  Vaginal 3/5; 8s, 6 reps 4/5  Internal Anal Sphincter    External Anal Sphincter    Puborectalis    Diastasis Recti    (Blank rows = not tested)         TONE: Slightly increased 4/9 - continues to be increased   PROLAPSE: Not seen in hooklying with cough  TODAY'S TREATMENT:                                                                                                                              DATE:    01/27/24: External pelvic floor manual for improved tissue mobility, decreased trigger points and improved pain levels with intercourse and urinary frequency - bil obturators with STM and trigger point release work completed hands on with Lt worse than Rt however pt reports she has been working on Rt side more at home.  Patient consented to internal pelvic floor treatment vaginally this date and found to have continued tension at bil obturators and pain with palpation however Lt more than Rt. Trigger point release work completed with several trigger points bil and more at Lt today, tolerated fairly but limited due to pain and tension remaining at end of session.   02/09/24: Trigger Point Dry Needling completed by HELMUS, JITKA   Subsequent Treatment: Instructions provided previously at initial dry needling treatment.   Patient Verbal Consent Given: Yes Education Handout Provided: Yes Muscles Treated: bil obturator  Electrical Stimulation Performed: No Treatment Response/Outcome: pt reports twitch felt, tension release post treatment and pt demonstrated and reported improved pain with palpation compared to pre dry needling.     External pelvic floor manual for improved tissue mobility, decreased trigger points and improved pain levels with  intercourse and urinary frequency - bil obturators with STM and trigger point release work completed hands however not fully released. Pt educated on dry needling and pt very open to attempting, details above, tolerated well and left clinic in good condition. During manual educated on bladder diary and denied questions, handouts given.  Bil piriformis stretch 2x30s, bil single knee to chest 2x30s,  double knee to chest x30s, childs pose 2x30s and HEP updated   02/16/24: Earline Glenn pose 2x30s Foam rolling glutes x10, foam roller bil piriformis x10 each Transverse abdominis activation in hooklying max mutli modal cues for activation Transverse abdominis activation 3x10 with ball squeeze Hooklying opp hand/knee ball press 2x10 Pt educated on continued use of pelvic wand/vibrating wand 2-3x weekly for mobility and tissue relaxation for decreased pain.   02/24/24 Half roller - horizontal thoracic extension in hooklying x10 wide claps, x10 angel wings Half roller - vertical thoracic extension x10 wide claps Lumbar rotations x10 Butterfly 3x30s with pelvic drops  Hip abduction green band 2x10 Opp hand/knee ball press 2x10 Hooklying alt marching green band 2x10 X5 Sit to stand single legs with kick stand Modified pigeon pose at mat table 2x30s each  5/13: External manual for clitoral hood mobilizations in upward and bil directions for decreased pain with arousal and climax. Pt had restrictions in all directions and did report replicated pain with these stretches but tolerated well and demonstrated moderate release but not resolved. Bil proximal adductors found to have several trigger points and tension Rt>Lt today and hands on STM completed, progressed to soft tissue massage tool for improved tissue release with good effect.  Intermittent cues for decreased abdominal, gluteals, and adductor gripping, pelvic drops, and diaphragmatic breathing .  Vibration plate low setting in sitting 1 mins, 30s standing each with adductor stretch  Pt educated on continuing relaxation techniques, possible beginner yoga for hip stretching   PATIENT EDUCATION:  Education details: [id number], urge drill  Person educated: Patient Education method: Explanation, Demonstration, Tactile cues, Verbal cues, and Handouts Education comprehension: verbalized understanding, returned demonstration, verbal cues required, tactile  cues required, and needs further education  HOME EXERCISE PROGRAM: Access Code: [email or web address] URL: [email or web address] Date: 02/09/2024 Prepared by: Nathania Waldman   Exercises - Seated Diaphragmatic Breathing  - 1 x daily - 7 x weekly - 2 sets - 10 reps - Supine Diaphragmatic Breathing  - 1 x daily - 7 x weekly - 2 sets - 10 reps - Supine Lower Trunk Rotation  - 1 x daily - 7 x weekly - 1 sets - 10 reps - 5s holds - Supine Butterfly Groin Stretch  - 1 x daily - 7 x weekly - 3 sets - 30s hold - Supine Figure 4 Piriformis Stretch  - 1 x daily - 7 x weekly - 1 sets - 3 reps - 30s holds - Supine Single Knee to Chest  - 1 x daily - 7 x weekly - 1 sets - 3 reps - 30s holds - Diaphragmatic Breathing in Child's Pose with Pelvic Floor Relaxation  - 1 x daily - 7 x weekly - 3 sets - 30s hold - Figure 4 Gluteus Mobilization on Foam Roll  - 1 x daily - 7 x weekly - 1 sets - 10 reps  ASSESSMENT:  CLINICAL IMPRESSION: Patient is a 38 y.o. female  who was seen today for physical therapy evaluation and treatment for pelvic pain, vulva pain, increased urinary frequency and leakage. Pt tolerated session well, denied increased pain during session and had decreased  pain at end of session. Pt does continue to have tension throughout pelvic and bil hip/low back complex but progressing. Has been very motivated to participate in all recommendations but symptoms have not completely resolved yet.  Pt would benefit from additional PT to further address deficits as she is progressing well as evident by functional symptoms decreasing and PFIQ-7 scoring and now that she is having much less pain she can tolerate more strengthening exercises for less urinary incontinence and more pelvic stability for better skin integrity, no wetting clothing/underwear, and tolerate returning to working out .    OBJECTIVE IMPAIRMENTS: decreased activity tolerance, decreased coordination, decreased endurance, decreased mobility, decreased  strength, increased fascial restrictions, increased muscle spasms, impaired flexibility, improper body mechanics, postural dysfunction, and pain.   ACTIVITY LIMITATIONS: carrying, lifting, standing, squatting, continence, and locomotion level  PARTICIPATION LIMITATIONS: interpersonal relationship and community activity  PERSONAL FACTORS: Fitness, Time since onset of injury/illness/exacerbation, and 1 comorbidity: medical history  are also affecting patient's functional outcome.   REHAB POTENTIAL: Good  CLINICAL DECISION MAKING: Stable/uncomplicated  EVALUATION COMPLEXITY: Low   GOALS: Goals reviewed with patient? Yes  SHORT TERM GOALS: Target date: 01/28/24  Pt to be I with HEP.  Baseline: Goal status: MET  2.  Pt to be I with diaphragmatic breathing and pelvic floor mobility to decreased strain at pelvic floor and pain levels Baseline:  Goal status:  MET  3.  Pt to be I with urge drill to decreased frequency of urine and improved ability to complete work day without frequent bathroom breaks.  Baseline:  Goal status:  MET  4.  Pt to be with knack to decreased urinary incontinence with stressors.  Baseline:  Goal status: MET 4/29  LONG TERM GOALS: Target date: 07/02/2024  Pt to be I with advanced HEP.  Baseline:  Goal status: MET 4/29  2.  Pt will be able to go 2-3 hours in between voids without urgency or incontinence in order to improve QOL and perform all functional activities with less difficulty.   Baseline:  Goal status: on going for consistency  3.  Pt will increase all impaired lumbar A/ROM by 25% without pain.  Baseline:  Goal status: MET 5/7  4.  Pt to report improved tolerance to walking at least 30 mins without increased pain for returning healthy exercise levels.  Baseline:  Goal status: MET 5/7  5.  Pt to report no more than 1/10 pelvic pain for improved QOL.  Baseline:  Goal status: on going for consistency  6.  Pt to report no more than one  instance of urinary incontinence in a week for improved QOL.  Baseline:  Goal status: MET 4/29  PLAN:  PT FREQUENCY: 2x/week  PT DURATION: 12 sessions  PLANNED INTERVENTIONS: 97110-Therapeutic exercises, 97530- Therapeutic activity, 97112- Neuromuscular re-education, 97535- Self Care, [id number]- Manual therapy, Patient/Family education, Balance training, Taping, Dry Needling, Joint mobilization, Spinal mobilization, Scar mobilization, DME instructions, Cryotherapy, Moist heat, and Biofeedback  PLAN FOR NEXT SESSION: internal as needed and pt consents, hip and trunk stretching, core and hip strengthening, voiding mechanics, coordination of pelvic floor    Avie Lemme, PT, DPT 05/13/254:26 PM

## 2024-03-02 ENCOUNTER — Ambulatory Visit: Admitting: Physical Therapy

## 2024-03-08 ENCOUNTER — Ambulatory Visit: Admitting: Physical Therapy

## 2024-03-08 DIAGNOSIS — M6281 Muscle weakness (generalized): Secondary | ICD-10-CM

## 2024-03-08 DIAGNOSIS — M62838 Other muscle spasm: Secondary | ICD-10-CM

## 2024-03-08 DIAGNOSIS — R293 Abnormal posture: Secondary | ICD-10-CM

## 2024-03-08 NOTE — Therapy (Signed)
 OUTPATIENT PHYSICAL THERAPY FEMALE PELVIC TREATMENT   Patient Name: Mary Wyatt MRN: [id number] DOB:07-Nov-1985, 38 y.o., female Today's Date: 03/08/2024  END OF SESSION:  PT End of Session - 03/08/24 0850     Visit Number 9    Date for PT Re-Evaluation 07/02/24    Authorization Type healthy blue    Authorization Time Period CARELON APPROVED 5 VISITS #[id number] 01/27/2024-03/26/2024    Authorization - Number of Visits 5    PT Start Time 0846    PT Stop Time 0929    PT Time Calculation (min) 43 min    Activity Tolerance Patient tolerated treatment well    Behavior During Therapy [facility] for tasks assessed/performed              Past Medical History:  Diagnosis Date   Abdominal pain    Abnormal EKG    Dec 2019 and Jan 2020/ saw cardiologist   Allergy    Altered bowel function    Anemia    Anxiety    Biliary dyskinesia    Celiac disease    Chest tightness    Depression    Dermatitis    Deviated septum    DOE (dyspnea on exertion)    Dysmenorrhea    Endometriosis    Episodic lightheadedness    ETD (Eustachian tube dysfunction), bilateral    Family history of brain aneurysm    Genital warts    GERD (gastroesophageal reflux disease)    Hashimoto's disease    History of meningioma    Hypotension    Insomnia    Interstitial cystitis    Meningioma (HCC)    Mononucleosis    Pelvic pain    Perineal pain    Post-operative nausea and vomiting    Rhinitis    RMSF [facility] spotted fever) 2015   S/P resection of meningioma    Seasonal allergic rhinitis    Tendinitis    Ulcer    Unspecified dyspareunia    Urethral syndrome    Vitamin D  deficiency    Past Surgical History:  Procedure Laterality Date   COLONOSCOPY  2013   CRANIOTOMY  09/2018   laproscopic surgery     2013 and 2019   OVARIAN CYST REMOVAL     TONSILLECTOMY     UPPER GASTROINTESTINAL ENDOSCOPY  2013   Patient Active Problem List   Diagnosis Date Noted   Hypotension - chronic  09/19/2021   Anxiety and depression 06/15/2017   Family history of brain aneurysm 04/19/2017   Rhinitis, allergic 04/20/2014   Generalized anxiety disorder 04/20/2014   Celiac disease 12/15/2012   Interstitial cystitis     PCP: Audria Leather, MD   REFERRING PROVIDER: Audria Leather, MD   REFERRING DIAG: [phone] (ICD-10-CM) - Vulvodynia N34.3 (ICD-10-CM) - Urethral syndrome R10.2 (ICD-10-CM) - Vaginal pain  THERAPY DIAG:  Other muscle spasm  Muscle weakness (generalized)  Abnormal posture  Rationale for Evaluation and Treatment: Rehabilitation  ONSET DATE: 1.5 years ago  SUBJECTIVE:  SUBJECTIVE STATEMENT: Having a little burning at vaginal opening, climax pain is still there at clitoris. Did have no pain with one climax last week but others had the sharp "tugging" felt. Feels tight. Lt obturator pain 3/10, back pain about 6/10 right now but has lower often now.   Fluid intake: water - 70 oz, coffee in am, black tea in afternoon, sometimes V8.   PAIN:  5/20 Are you having pain? Yes NPRS scale: 6/10 Pain location: back  Pain type: dull achy Pain description: intermittent and     Aggravating factors: standing for 20 mins Relieving factors: mobility   PRECAUTIONS: None  RED FLAGS: None   WEIGHT BEARING RESTRICTIONS: No  FALLS:  Has patient fallen in last 6 months? No  OCCUPATION: not currently   ACTIVITY LEVEL : active at home but less exercise.   PLOF: Independent  PATIENT GOALS: to be more comfortable and no urine leakage   PERTINENT HISTORY:  recurrent BV and yeast,anxiety, celiac disease, depression, endometriosis, genital warts, IC, CRANIOTOMY 2019,  Sexual abuse: No  BOWEL MOVEMENT: Pain with bowel movement: No Type of bowel movement:Type (Bristol Stool  Scale) 4, Frequency daily, and Strain sometimes (does have hemorrhoids) Fully empty rectum: Yes: but has two morning bowel movements  Leakage: No Pads: No Fiber supplement/laxative No  URINATION: Pain with urination: No Fully empty bladder: Yes:   Stream: Strong Urgency: No Frequency: sometimes pending on the day 45 mins - hour Leakage: Sneezing and post void dribble Pads: No  INTERCOURSE:  Ability to have vaginal penetration Yes  Pain with intercourse: After Intercourse Dryness No doesn't feel dry during intercourse but feels sore after Climax: not painful, but was having a little pain at clitoris recently and did have hurt to climax but resolved  Marinoff Scale: 0/3  PREGNANCY: Vaginal deliveries 0  C-section deliveries 0 Currently pregnant No  PROLAPSE: None   OBJECTIVE:  Note: Objective measures were completed at Evaluation unless otherwise noted.  DIAGNOSTIC FINDINGS:    PATIENT SURVEYS:    PFIQ-7: 57 01/27/24 - 48 02/24/24: 38 03/01/24: 38 COGNITION: Overall cognitive status: Within functional limits for tasks assessed     SENSATION: Light touch: Deficits reports tingling and numbness in bil hands/feet that comes and goes - has neuro appointment  LUMBAR SPECIAL TESTS:  Single leg stance test: hip drop noted 5s on Lt stance leg and at 7s Rt stance leg, mild low back pain noted with either side, SI Compression/distraction test: Negative, and FABER test: Negative  FUNCTIONAL TESTS:  Functional squat - decreased descent by 50% and bil valgus noted  GAIT: WFL  POSTURE: rounded shoulders, forward head, and posterior pelvic tilt   LUMBARAROM/PROM:  A/PROM A/PROM  eval  Flexion Limited by 25%  Extension WFL  Right lateral flexion Limited by 25%  Left lateral flexion Limited by 25%  Right rotation Limited by 25%  Left rotation Limited by 25%   (Blank rows = not tested)  LOWER EXTREMITY ROM:  Bil hamstrings and adductors limited by 25% and tightness  noted in bil hip ER and IR.   LOWER EXTREMITY MMT:  Bil hip abduction 3+/5 all other hips grossly 4/5; knees 5/5 PALPATION:   General: tightness noted in bil lumbar and thoracic paraspinals, bil gluteals  Pelvic Alignment: WFL  Abdominal: mld TTP lower quadrants, fascial restrictions throughout                 External Perineal Exam: decreased mobility at clitoral hood with dryness noted throughout  vulva                             Internal Pelvic Floor: TTP throughout bil superficial and deep layers, trigger points noted throughout Rt obturator internus, pubococcygeus and bulbocavernosus   Patient confirms identification and approves PT to assess internal pelvic floor and treatment Yes No emotional/communication barriers or cognitive limitation. Patient is motivated to learn. Patient understands and agrees with treatment goals and plan. PT explains patient will be examined in standing, sitting, and lying down to see how their muscles and joints work. When they are ready, they will be asked to remove their underwear so PT can examine their perineum. The patient is also given the option of providing their own chaperone as one is not provided in our facility. The patient also has the right and is explained the right to defer or refuse any part of the evaluation or treatment including the internal exam. With the patient's consent, PT will use one gloved finger to gently assess the muscles of the pelvic floor, seeing how well it contracts and relaxes and if there is muscle symmetry. After, the patient will get dressed and PT and patient will discuss exam findings and plan of care. PT and patient discuss plan of care, schedule, attendance policy and HEP activities.  PELVIC MMT:   MMT eval 01/27/24  Vaginal 3/5; 8s, 6 reps 4/5  Internal Anal Sphincter    External Anal Sphincter    Puborectalis    Diastasis Recti    (Blank rows = not tested)        TONE: Slightly increased 4/9 - continues to be  increased   PROLAPSE: Not seen in hooklying with cough  TODAY'S TREATMENT:                                                                                                                              DATE:  02/24/24 Half roller - horizontal thoracic extension in hooklying x10 wide claps, x10 angel wings Half roller - vertical thoracic extension x10 wide claps Lumbar rotations x10 Butterfly 3x30s with pelvic drops  Hip abduction green band 2x10 Opp hand/knee ball press 2x10 Hooklying alt marching green band 2x10 X5 Sit to stand single legs with kick stand Modified pigeon pose at mat table 2x30s each  5/13: External manual for clitoral hood mobilizations in upward and bil directions for decreased pain with arousal and climax. Pt had restrictions in all directions and did report replicated pain with these stretches but tolerated well and demonstrated moderate release but not resolved. Bil proximal adductors found to have several trigger points and tension Rt>Lt today and hands on STM completed, progressed to soft tissue massage tool for improved tissue release with good effect.  Intermittent cues for decreased abdominal, gluteals, and adductor gripping, pelvic drops, and diaphragmatic breathing .  Vibration plate low setting in sitting 1 mins, 30s standing each with  adductor stretch  Pt educated on continuing relaxation techniques, possible beginner yoga for hip stretching   03/08/24: External manual for clitoral hood mobilizations in upward and bil directions for decreased pain with arousal and climax. Pt had restrictions in all directions but more so at right today at at right ischiocavernosus and did report replicated pain with these stretches but tolerated well and demonstrated moderate release but not resolved.  Hand hygiene completed and gloves changed internal pelvic floor manual at bil obturators with Lt tighter than Rt. And while completing manual pt educated on vibrator or wand use for  these releases at home, diaphragmatic breathing, relaxation for pelvic floor pain but also to continue to implement daily activity (walks, yoga, HEP/strengthening) pt agreed.   PATIENT EDUCATION:  Education details: [id number], urge drill  Person educated: Patient Education method: Explanation, Demonstration, Tactile cues, Verbal cues, and Handouts Education comprehension: verbalized understanding, returned demonstration, verbal cues required, tactile cues required, and needs further education  HOME EXERCISE PROGRAM: Access Code: [email or web address] URL: [email or web address] Date: 02/09/2024 Prepared by: Arlen Legendre Exercises - Seated Diaphragmatic Breathing  - 1 x daily - 7 x weekly - 2 sets - 10 reps - Supine Diaphragmatic Breathing  - 1 x daily - 7 x weekly - 2 sets - 10 reps - Supine Lower Trunk Rotation  - 1 x daily - 7 x weekly - 1 sets - 10 reps - 5s holds - Supine Butterfly Groin Stretch  - 1 x daily - 7 x weekly - 3 sets - 30s hold - Supine Figure 4 Piriformis Stretch  - 1 x daily - 7 x weekly - 1 sets - 3 reps - 30s holds - Supine Single Knee to Chest  - 1 x daily - 7 x weekly - 1 sets - 3 reps - 30s holds - Diaphragmatic Breathing in Child's Pose with Pelvic Floor Relaxation  - 1 x daily - 7 x weekly - 3 sets - 30s hold - Figure 4 Gluteus Mobilization on Foam Roll  - 1 x daily - 7 x weekly - 1 sets - 10 reps  ASSESSMENT:  CLINICAL IMPRESSION: Patient is a 38 y.o. female  who was seen today for physical therapy treatment for pelvic pain, vulva pain, increased urinary frequency and leakage. Pt tolerated session well, denied increased pain during session and had decreased pain at end of session. Pt does continue to have tension throughout pelvic and bil hip/low back complex but progressing. Has been very motivated to participate in all recommendations but symptoms have not completely resolved yet.  Pt would benefit from additional PT to further address deficits to tolerate more  strengthening and decreased pain and improve stability.   OBJECTIVE IMPAIRMENTS: decreased activity tolerance, decreased coordination, decreased endurance, decreased mobility, decreased strength, increased fascial restrictions, increased muscle spasms, impaired flexibility, improper body mechanics, postural dysfunction, and pain.   ACTIVITY LIMITATIONS: carrying, lifting, standing, squatting, continence, and locomotion level  PARTICIPATION LIMITATIONS: interpersonal relationship and community activity  PERSONAL FACTORS: Fitness, Time since onset of injury/illness/exacerbation, and 1 comorbidity: medical history  are also affecting patient's functional outcome.   REHAB POTENTIAL: Good  CLINICAL DECISION MAKING: Stable/uncomplicated  EVALUATION COMPLEXITY: Low   GOALS: Goals reviewed with patient? Yes  SHORT TERM GOALS: Target date: 01/28/24  Pt to be I with HEP.  Baseline: Goal status: MET  2.  Pt to be I with diaphragmatic breathing and pelvic floor mobility to decreased strain at pelvic floor and pain levels Baseline:  Goal status:  MET  3.  Pt to be I with urge drill to decreased frequency of urine and improved ability to complete work day without frequent bathroom breaks.  Baseline:  Goal status:  MET  4.  Pt to be with knack to decreased urinary incontinence with stressors.  Baseline:  Goal status: MET 4/29  LONG TERM GOALS: Target date: 07/02/2024  Pt to be I with advanced HEP.  Baseline:  Goal status: MET 4/29  2.  Pt will be able to go 2-3 hours in between voids without urgency or incontinence in order to improve QOL and perform all functional activities with less difficulty.   Baseline:  Goal status: on going for consistency  3.  Pt will increase all impaired lumbar A/ROM by 25% without pain.  Baseline:  Goal status: MET 5/7  4.  Pt to report improved tolerance to walking at least 30 mins without increased pain for returning healthy exercise levels.   Baseline:  Goal status: MET 5/7  5.  Pt to report no more than 1/10 pelvic pain for improved QOL.  Baseline:  Goal status: on going for consistency  6.  Pt to report no more than one instance of urinary incontinence in a week for improved QOL.  Baseline:  Goal status: MET 4/29  PLAN:  PT FREQUENCY: 2x/week  PT DURATION: 12 sessions  PLANNED INTERVENTIONS: 97110-Therapeutic exercises, 97530- Therapeutic activity, 97112- Neuromuscular re-education, 97535- Self Care, 30865- Manual therapy, Patient/Family education, Balance training, Taping, Dry Needling, Joint mobilization, Spinal mobilization, Scar mobilization, DME instructions, Cryotherapy, Moist heat, and Biofeedback  PLAN FOR NEXT SESSION: internal as needed and pt consents, hip and trunk stretching, core and hip strengthening, voiding mechanics, coordination of pelvic floor    Avie Lemme, PT, DPT 05/20/259:50 AM

## 2024-03-09 ENCOUNTER — Ambulatory Visit: Admitting: Obstetrics and Gynecology

## 2024-03-09 ENCOUNTER — Encounter: Payer: Self-pay | Admitting: Obstetrics and Gynecology

## 2024-03-09 VITALS — BP 112/76 | HR 84 | Temp 98.2°F | Wt 168.2 lb

## 2024-03-09 DIAGNOSIS — N760 Acute vaginitis: Secondary | ICD-10-CM | POA: Diagnosis not present

## 2024-03-09 DIAGNOSIS — R208 Other disturbances of skin sensation: Secondary | ICD-10-CM | POA: Diagnosis not present

## 2024-03-09 DIAGNOSIS — B9689 Other specified bacterial agents as the cause of diseases classified elsewhere: Secondary | ICD-10-CM

## 2024-03-09 LAB — URINALYSIS, COMPLETE W/RFL CULTURE
Bacteria, UA: NONE SEEN /HPF
Bilirubin Urine: NEGATIVE
Glucose, UA: NEGATIVE
Hgb urine dipstick: NEGATIVE
Hyaline Cast: NONE SEEN /LPF
Ketones, ur: NEGATIVE
Leukocyte Esterase: NEGATIVE
Nitrites, Initial: NEGATIVE
Protein, ur: NEGATIVE
RBC / HPF: NONE SEEN /HPF (ref 0–2)
Specific Gravity, Urine: 1.003 (ref 1.001–1.035)
WBC, UA: NONE SEEN /HPF (ref 0–5)
pH: 6 (ref 5.0–8.0)

## 2024-03-09 LAB — WET PREP FOR TRICH, YEAST, CLUE

## 2024-03-09 LAB — NO CULTURE INDICATED

## 2024-03-09 MED ORDER — NUVESSA 1.3 % VA GEL: 1.0000 | Freq: Once | VAGINAL | 0 refills | Status: AC

## 2024-03-09 MED ORDER — METRONIDAZOLE 0.75 % VA GEL: 1.0000 | VAGINAL | 7 refills | Status: DC

## 2024-03-09 NOTE — Progress Notes (Signed)
 38 y.o. G0P0000 female with subacute vaginitis, endometriosis here for vaginal symptoms. Single.  Patient's last menstrual period was 02/24/2024 (approximate). Period Duration (Days): 6 Period Pattern: Regular Menstrual Flow: Moderate Menstrual Control: Maxi pad, Tampon Dysmenorrhea: (!) Moderate Dysmenorrhea Symptoms: Nausea, Headache  She reports a  vaginal burning sensation x 1 year; some burning with urination - pt reports pain mainly in labia area.  Used premarin  vag cream once but it burned really bad, since then she went back to using estrace  vag cream.  Currently in pelvic floor therapy. Reports clitoris pain x 6 months. Used PH friendly vag cream on the clitoris area for 3 days, temp relief. reports a stringing tugging, feeling;  very uncomfortable.  Hx of BV, Denies odor or abnormal discharge.  Birth control: None Sexually active: not currently, recently single  GYN HISTORY: Endometriosis  OB History  Gravida Para Term Preterm AB Living  0 0 0 0 0 0  SAB IAB Ectopic Multiple Live Births  0 0 0 0 0   Past Medical History:  Diagnosis Date   Abdominal pain    Abnormal EKG    Dec 2019 and Jan 2020/ saw cardiologist   Allergy    Altered bowel function    Anemia    Anxiety    Biliary dyskinesia    Celiac disease    Chest tightness    Depression    Dermatitis    Deviated septum    DOE (dyspnea on exertion)    Dysmenorrhea    Endometriosis    Episodic lightheadedness    ETD (Eustachian tube dysfunction), bilateral    Family history of brain aneurysm    Genital warts    GERD (gastroesophageal reflux disease)    Hashimoto's disease    History of meningioma    Hypotension    Insomnia    Interstitial cystitis    Meningioma (HCC)    Mononucleosis    Pelvic pain    Perineal pain    Post-operative nausea and vomiting    Rhinitis    RMSF [facility] spotted fever) 2015   S/P resection of meningioma    Seasonal allergic rhinitis    Tendinitis    Ulcer     Unspecified dyspareunia    Urethral syndrome    Vitamin D  deficiency    Past Surgical History:  Procedure Laterality Date   COLONOSCOPY  2013   CRANIOTOMY  09/2018   laproscopic surgery     2013 and 2019   OVARIAN CYST REMOVAL     TONSILLECTOMY     UPPER GASTROINTESTINAL ENDOSCOPY  2013   Current Outpatient Medications on File Prior to Visit  Medication Sig Dispense Refill   ALPRAZolam  (XANAX ) 0.25 MG tablet Take 0.25 mg by mouth as needed.     Cholecalciferol 125 MCG (5000 UT) capsule Take 5,000 Units by mouth 2 (two) times a week.     Cyanocobalamin  1000 MCG SUBL Place 1 tablet under the tongue daily.     cyclobenzaprine  (FLEXERIL ) 10 MG tablet Take 10 mg by mouth 3 (three) times daily as needed.     cyclobenzaprine  (FLEXERIL ) 5 MG tablet Take 5 mg by mouth at bedtime.     estradiol  (ESTRACE  VAGINAL) 0.1 MG/GM vaginal cream Place 1 g vaginally 3 (three) times a week. 42.5 g 3   fluticasone  (FLONASE ) 50 MCG/ACT nasal spray      hydrOXYzine  (ATARAX ) 25 MG tablet      Lactic Ac-Citric Ac-Pot Bitart (PHEXXI ) 1.8-1-0.4 % GEL Place  5 g vaginally as directed. Before intercourse 180 g 11   Lactoferrin 250 MG CAPS Take 250 mg by mouth daily.     lamoTRIgine (LAMICTAL) 25 MG tablet Take by mouth.     loratadine (CLARITIN) 10 MG tablet      montelukast  (SINGULAIR ) 10 MG tablet Take 10 mg by mouth daily.     Multiple Vitamin (MULTIVITAMIN) tablet Take 1 tablet by mouth daily. Women's MVI- Take one daily     olopatadine  (PATADAY ) 0.1 % ophthalmic solution      Probiotic Product (FEM-DOPHILUS WOMENS PO)      traMADol (ULTRAM) 50 MG tablet Take 50 mg by mouth every 6 (six) hours as needed for moderate pain (pain score 4-6).     Triamcinolone  Acetonide (TRIAMCINOLONE  0.1 % CREAM : EUCERIN) CREA      VENTOLIN  HFA 108 (90 Base) MCG/ACT inhaler SMARTSIG:1-2 Puff(s) Via Inhaler Every 4-6 Hours PRN     clindamycin (CLEOCIN T) 1 % external solution  (Patient not taking: Reported on 03/09/2024)      RABEprazole (ACIPHEX) 20 MG tablet Take 20 mg by mouth daily.     UNABLE TO FIND Compounded hemorrhoid cream (Patient not taking: Reported on 03/09/2024)     No current facility-administered medications on file prior to visit.   Allergies  Allergen Reactions   Diflucan [Fluconazole] Hives   Gluten Meal Other (See Comments) and Nausea And Vomiting    Celiac       PE Today's Vitals   03/09/24 1433  BP: 112/76  Pulse: 84  Temp: 98.2 F (36.8 C)  TempSrc: Oral  SpO2: 98%  Weight: 168 lb 3.2 oz (76.3 kg)   Body mass index is 27.56 kg/m.  Physical Exam Vitals reviewed. Exam conducted with a chaperone present.  Constitutional:      General: She is not in acute distress.    Appearance: Normal appearance.  HENT:     Head: Normocephalic and atraumatic.     Nose: Nose normal.  Eyes:     Extraocular Movements: Extraocular movements intact.     Conjunctiva/sclera: Conjunctivae normal.  Pulmonary:     Effort: Pulmonary effort is normal.  Genitourinary:    General: Normal vulva.     Exam position: Lithotomy position.     Vagina: Vaginal discharge present.     Cervix: Normal. No lesion.  Musculoskeletal:        General: Normal range of motion.     Cervical back: Normal range of motion.  Neurological:     General: No focal deficit present.     Mental Status: She is alert.  Psychiatric:        Mood and Affect: Mood normal.        Behavior: Behavior normal.       Assessment and Plan:        Burning sensation -     Urinalysis,Complete w/RFL Culture -     WET PREP FOR TRICH, YEAST, CLUE  BV (bacterial vaginosis) -     metroNIDAZOLE ; Place 1 Applicatorful vaginally 2 (two) times a week for 52 doses. 6 months of treatment.  Dispense: 70 g; Refill: 7 -     Nuvessa ; Place 1 applicator vaginally once for 1 dose.  Dispense: 5 g; Refill: 0  Other orders -     REFLEXIVE URINE CULTURE  4th infection this year. Recommend suppression. Patient in agreement.   Romaine Closs,  MD

## 2024-03-10 ENCOUNTER — Encounter: Admitting: Physical Therapy

## 2024-03-10 ENCOUNTER — Ambulatory Visit: Payer: Self-pay | Admitting: Obstetrics and Gynecology

## 2024-03-15 ENCOUNTER — Ambulatory Visit: Admitting: Physical Therapy

## 2024-03-15 DIAGNOSIS — M62838 Other muscle spasm: Secondary | ICD-10-CM | POA: Diagnosis not present

## 2024-03-15 DIAGNOSIS — R279 Unspecified lack of coordination: Secondary | ICD-10-CM

## 2024-03-15 DIAGNOSIS — M6281 Muscle weakness (generalized): Secondary | ICD-10-CM

## 2024-03-15 DIAGNOSIS — R293 Abnormal posture: Secondary | ICD-10-CM

## 2024-03-15 NOTE — Therapy (Signed)
 OUTPATIENT PHYSICAL THERAPY FEMALE PELVIC TREATMENT   Patient Name: Mary Wyatt MRN: [id number] DOB:January 15, 1986, 38 y.o., female Today's Date: 03/15/2024  END OF SESSION:  PT End of Session - 03/15/24 0849     Visit Number 10    Date for PT Re-Evaluation 07/02/24    Authorization Type healthy blue    Authorization Time Period [employer] approved 4 visits 03/09/2024 - 04/07/2024 auth#[id number]    Authorization - Visit Number 1    Authorization - Number of Visits 4    PT Start Time 0847    PT Stop Time 0927    PT Time Calculation (min) 40 min    Activity Tolerance Patient tolerated treatment well    Behavior During Therapy [facility] for tasks assessed/performed              Past Medical History:  Diagnosis Date   Abdominal pain    Abnormal EKG    Dec 2019 and Jan 2020/ saw cardiologist   Allergy    Altered bowel function    Anemia    Anxiety    Biliary dyskinesia    Celiac disease    Chest tightness    Depression    Dermatitis    Deviated septum    DOE (dyspnea on exertion)    Dysmenorrhea    Endometriosis    Episodic lightheadedness    ETD (Eustachian tube dysfunction), bilateral    Family history of brain aneurysm    Genital warts    GERD (gastroesophageal reflux disease)    Hashimoto's disease    History of meningioma    Hypotension    Insomnia    Interstitial cystitis    Meningioma (HCC)    Mononucleosis    Pelvic pain    Perineal pain    Post-operative nausea and vomiting    Rhinitis    RMSF [facility] spotted fever) 2015   S/P resection of meningioma    Seasonal allergic rhinitis    Tendinitis    Ulcer    Unspecified dyspareunia    Urethral syndrome    Vitamin D  deficiency    Past Surgical History:  Procedure Laterality Date   COLONOSCOPY  2013   CRANIOTOMY  09/2018   laproscopic surgery     2013 and 2019   OVARIAN CYST REMOVAL     TONSILLECTOMY     UPPER GASTROINTESTINAL ENDOSCOPY  2013   Patient Active Problem List   Diagnosis  Date Noted   Hypotension - chronic 09/19/2021   Anxiety and depression 06/15/2017   Family history of brain aneurysm 04/19/2017   Rhinitis, allergic 04/20/2014   Generalized anxiety disorder 04/20/2014   Celiac disease 12/15/2012   Interstitial cystitis     PCP: Audria Leather, MD   REFERRING PROVIDER: Audria Leather, MD   REFERRING DIAG: [phone] (ICD-10-CM) - Vulvodynia N34.3 (ICD-10-CM) - Urethral syndrome R10.2 (ICD-10-CM) - Vaginal pain  THERAPY DIAG:  Other muscle spasm  Muscle weakness (generalized)  Abnormal posture  Unspecified lack of coordination  Rationale for Evaluation and Treatment: Rehabilitation  ONSET DATE: 1.5 years ago  SUBJECTIVE:  SUBJECTIVE STATEMENT: Pt reports she has BV again, diagnosis 21st. Pt   Fluid intake: water - 70 oz, coffee in am, black tea in afternoon, sometimes V8.   PAIN:  5/20 Are you having pain? Yes NPRS scale: 6/10 Pain location: back  Pain type: dull achy Pain description: intermittent and     Aggravating factors: standing for 20 mins Relieving factors: mobility   PRECAUTIONS: None  RED FLAGS: None   WEIGHT BEARING RESTRICTIONS: No  FALLS:  Has patient fallen in last 6 months? No  OCCUPATION: not currently   ACTIVITY LEVEL : active at home but less exercise.   PLOF: Independent  PATIENT GOALS: to be more comfortable and no urine leakage   PERTINENT HISTORY:  recurrent BV and yeast,anxiety, celiac disease, depression, endometriosis, genital warts, IC, CRANIOTOMY 2019,  Sexual abuse: No  BOWEL MOVEMENT: Pain with bowel movement: No Type of bowel movement:Type (Bristol Stool Scale) 4, Frequency daily, and Strain sometimes (does have hemorrhoids) Fully empty rectum: Yes: but has two morning bowel movements   Leakage: No Pads: No Fiber supplement/laxative No  URINATION: Pain with urination: No Fully empty bladder: Yes:   Stream: Strong Urgency: No Frequency: sometimes pending on the day 45 mins - hour Leakage: Sneezing and post void dribble Pads: No  INTERCOURSE:  Ability to have vaginal penetration Yes  Pain with intercourse: After Intercourse Dryness No doesn't feel dry during intercourse but feels sore after Climax: not painful, but was having a little pain at clitoris recently and did have hurt to climax but resolved  Marinoff Scale: 0/3  PREGNANCY: Vaginal deliveries 0  C-section deliveries 0 Currently pregnant No  PROLAPSE: None   OBJECTIVE:  Note: Objective measures were completed at Evaluation unless otherwise noted.  DIAGNOSTIC FINDINGS:    PATIENT SURVEYS:    PFIQ-7: 57 01/27/24 - 48 02/24/24: 38 03/01/24: 38 COGNITION: Overall cognitive status: Within functional limits for tasks assessed     SENSATION: Light touch: Deficits reports tingling and numbness in bil hands/feet that comes and goes - has neuro appointment  LUMBAR SPECIAL TESTS:  Single leg stance test: hip drop noted 5s on Lt stance leg and at 7s Rt stance leg, mild low back pain noted with either side, SI Compression/distraction test: Negative, and FABER test: Negative  FUNCTIONAL TESTS:  Functional squat - decreased descent by 50% and bil valgus noted  GAIT: WFL  POSTURE: rounded shoulders, forward head, and posterior pelvic tilt   LUMBARAROM/PROM:  A/PROM A/PROM  eval  Flexion Limited by 25%  Extension WFL  Right lateral flexion Limited by 25%  Left lateral flexion Limited by 25%  Right rotation Limited by 25%  Left rotation Limited by 25%   (Blank rows = not tested)  LOWER EXTREMITY ROM:  Bil hamstrings and adductors limited by 25% and tightness noted in bil hip ER and IR.   LOWER EXTREMITY MMT:  Bil hip abduction 3+/5 all other hips grossly 4/5; knees 5/5 PALPATION:    General: tightness noted in bil lumbar and thoracic paraspinals, bil gluteals  Pelvic Alignment: WFL  Abdominal: mld TTP lower quadrants, fascial restrictions throughout                 External Perineal Exam: decreased mobility at clitoral hood with dryness noted throughout vulva                             Internal Pelvic Floor: TTP throughout bil superficial and deep  layers, trigger points noted throughout Rt obturator internus, pubococcygeus and bulbocavernosus   Patient confirms identification and approves PT to assess internal pelvic floor and treatment Yes No emotional/communication barriers or cognitive limitation. Patient is motivated to learn. Patient understands and agrees with treatment goals and plan. PT explains patient will be examined in standing, sitting, and lying down to see how their muscles and joints work. When they are ready, they will be asked to remove their underwear so PT can examine their perineum. The patient is also given the option of providing their own chaperone as one is not provided in our facility. The patient also has the right and is explained the right to defer or refuse any part of the evaluation or treatment including the internal exam. With the patient's consent, PT will use one gloved finger to gently assess the muscles of the pelvic floor, seeing how well it contracts and relaxes and if there is muscle symmetry. After, the patient will get dressed and PT and patient will discuss exam findings and plan of care. PT and patient discuss plan of care, schedule, attendance policy and HEP activities.  PELVIC MMT:   MMT eval 01/27/24  Vaginal 3/5; 8s, 6 reps 4/5  Internal Anal Sphincter    External Anal Sphincter    Puborectalis    Diastasis Recti    (Blank rows = not tested)        TONE: Slightly increased 4/9 - continues to be increased   PROLAPSE: Not seen in hooklying with cough  TODAY'S TREATMENT:                                                                                                                               DATE: 5/13: External manual for clitoral hood mobilizations in upward and bil directions for decreased pain with arousal and climax. Pt had restrictions in all directions and did report replicated pain with these stretches but tolerated well and demonstrated moderate release but not resolved. Bil proximal adductors found to have several trigger points and tension Rt>Lt today and hands on STM completed, progressed to soft tissue massage tool for improved tissue release with good effect.  Intermittent cues for decreased abdominal, gluteals, and adductor gripping, pelvic drops, and diaphragmatic breathing .  Vibration plate low setting in sitting 1 mins, 30s standing each with adductor stretch  Pt educated on continuing relaxation techniques, possible beginner yoga for hip stretching   03/08/24: External manual for clitoral hood mobilizations in upward and bil directions for decreased pain with arousal and climax. Pt had restrictions in all directions but more so at right today at at right ischiocavernosus and did report replicated pain with these stretches but tolerated well and demonstrated moderate release but not resolved.  Hand hygiene completed and gloves changed internal pelvic floor manual at bil obturators with Lt tighter than Rt. And while completing manual pt educated on vibrator or wand use for these releases at home, diaphragmatic  breathing, relaxation for pelvic floor pain but also to continue to implement daily activity (walks, yoga, HEP/strengthening) pt agreed.   03/15/24: Opp hand/ knee ball press alt 2x10 Foam roller glutes x10, foam roller vertical thoracic extension 1 min Bird dogs 2x10 Tail wags x10 Piriformis stretch 2x30s  Happy baby seated 2x30s X10 squats with cues for technique Pelvic tilts x10 Manual at Rt gluteal, glute max, and lateral fibers of obturator - hands on STM with pt verbalizing TTP and  PT felt taut muscle bands.  Improvement noted and pt educated on continued alternating stretching and strengthening days at home.   PATIENT EDUCATION:  Education details: [id number], urge drill  Person educated: Patient Education method: Explanation, Demonstration, Tactile cues, Verbal cues, and Handouts Education comprehension: verbalized understanding, returned demonstration, verbal cues required, tactile cues required, and needs further education  HOME EXERCISE PROGRAM: Access Code: [email or web address] URL: [email or web address] Date: 02/09/2024 Prepared by: Kla Bily Exercises - Seated Diaphragmatic Breathing  - 1 x daily - 7 x weekly - 2 sets - 10 reps - Supine Diaphragmatic Breathing  - 1 x daily - 7 x weekly - 2 sets - 10 reps - Supine Lower Trunk Rotation  - 1 x daily - 7 x weekly - 1 sets - 10 reps - 5s holds - Supine Butterfly Groin Stretch  - 1 x daily - 7 x weekly - 3 sets - 30s hold - Supine Figure 4 Piriformis Stretch  - 1 x daily - 7 x weekly - 1 sets - 3 reps - 30s holds - Supine Single Knee to Chest  - 1 x daily - 7 x weekly - 1 sets - 3 reps - 30s holds - Diaphragmatic Breathing in Child's Pose with Pelvic Floor Relaxation  - 1 x daily - 7 x weekly - 3 sets - 30s hold - Figure 4 Gluteus Mobilization on Foam Roll  - 1 x daily - 7 x weekly - 1 sets - 10 reps  ASSESSMENT:  CLINICAL IMPRESSION: Patient is a 38 y.o. female  who was seen today for physical therapy treatment for pelvic pain, vulva pain, increased urinary frequency and leakage. Pt tolerated session well, denied increased pain during session and had decreased pain at end of session. Pt does continue to have tension throughout pelvic and bil hip/low back complex but progressing. Has been very motivated to participate in all recommendations but symptoms have not completely resolved yet.  Pt encouraged to continue home strengthening and stretching days, has had mild set back with pelvic burning with return of BV but  unsure if this is her clitoral burning or not reports she will self monitor symptoms and see if this improves with treatment for BV. Overall states she has been feeling much better compared to start of PT. Pt would benefit from additional PT to further address deficits to tolerate more strengthening and decreased pain and improve stability.   OBJECTIVE IMPAIRMENTS: decreased activity tolerance, decreased coordination, decreased endurance, decreased mobility, decreased strength, increased fascial restrictions, increased muscle spasms, impaired flexibility, improper body mechanics, postural dysfunction, and pain.   ACTIVITY LIMITATIONS: carrying, lifting, standing, squatting, continence, and locomotion level  PARTICIPATION LIMITATIONS: interpersonal relationship and community activity  PERSONAL FACTORS: Fitness, Time since onset of injury/illness/exacerbation, and 1 comorbidity: medical history  are also affecting patient's functional outcome.   REHAB POTENTIAL: Good  CLINICAL DECISION MAKING: Stable/uncomplicated  EVALUATION COMPLEXITY: Low   GOALS: Goals reviewed with patient? Yes  SHORT TERM GOALS: Target date: 01/28/24  Pt to be I  with HEP.  Baseline: Goal status: MET  2.  Pt to be I with diaphragmatic breathing and pelvic floor mobility to decreased strain at pelvic floor and pain levels Baseline:  Goal status:  MET  3.  Pt to be I with urge drill to decreased frequency of urine and improved ability to complete work day without frequent bathroom breaks.  Baseline:  Goal status:  MET  4.  Pt to be with knack to decreased urinary incontinence with stressors.  Baseline:  Goal status: MET 4/29  LONG TERM GOALS: Target date: 07/02/2024  Pt to be I with advanced HEP.  Baseline:  Goal status: MET 4/29  2.  Pt will be able to go 2-3 hours in between voids without urgency or incontinence in order to improve QOL and perform all functional activities with less difficulty.    Baseline:  Goal status: on going for consistency  3.  Pt will increase all impaired lumbar A/ROM by 25% without pain.  Baseline:  Goal status: MET 5/7  4.  Pt to report improved tolerance to walking at least 30 mins without increased pain for returning healthy exercise levels.  Baseline:  Goal status: MET 5/7  5.  Pt to report no more than 1/10 pelvic pain for improved QOL.  Baseline:  Goal status: on going for consistency  6.  Pt to report no more than one instance of urinary incontinence in a week for improved QOL.  Baseline:  Goal status: MET 4/29  PLAN:  PT FREQUENCY: 2x/week  PT DURATION: 12 sessions  PLANNED INTERVENTIONS: 97110-Therapeutic exercises, 97530- Therapeutic activity, 97112- Neuromuscular re-education, 97535- Self Care, 40981- Manual therapy, Patient/Family education, Balance training, Taping, Dry Needling, Joint mobilization, Spinal mobilization, Scar mobilization, DME instructions, Cryotherapy, Moist heat, and Biofeedback  PLAN FOR NEXT SESSION: internal as needed and pt consents, hip and trunk stretching, core and hip strengthening, voiding mechanics, coordination of pelvic floor    Avie Lemme, PT, DPT 03/15/2509:17 AM

## 2024-03-17 ENCOUNTER — Encounter: Admitting: Physical Therapy

## 2024-03-21 ENCOUNTER — Ambulatory Visit: Admitting: Obstetrics and Gynecology

## 2024-03-21 ENCOUNTER — Encounter: Payer: Self-pay | Admitting: Obstetrics and Gynecology

## 2024-03-21 ENCOUNTER — Telehealth: Payer: Self-pay | Admitting: *Deleted

## 2024-03-21 VITALS — BP 102/66 | HR 90 | Temp 98.5°F | Wt 166.0 lb

## 2024-03-21 DIAGNOSIS — N644 Mastodynia: Secondary | ICD-10-CM | POA: Diagnosis not present

## 2024-03-21 DIAGNOSIS — N760 Acute vaginitis: Secondary | ICD-10-CM | POA: Diagnosis not present

## 2024-03-21 DIAGNOSIS — B9689 Other specified bacterial agents as the cause of diseases classified elsewhere: Secondary | ICD-10-CM

## 2024-03-21 DIAGNOSIS — N9489 Other specified conditions associated with female genital organs and menstrual cycle: Secondary | ICD-10-CM

## 2024-03-21 LAB — WET PREP FOR TRICH, YEAST, CLUE

## 2024-03-21 MED ORDER — CLEOCIN 100 MG VA SUPP: 100.0000 mg | Freq: Every day | VAGINAL | 0 refills | Status: AC

## 2024-03-21 NOTE — Telephone Encounter (Signed)
 Call returned to patient. Patient treated for BV with Nuvessa  on 03/09/24, to be followed by Metrogel  twice weekly. Patient reports burning sensation is worse than it has been in the past. Denies any other symptoms. Last used vaginal estrogen on 03/19/24.   Advised I can review with Dr. Andrena Ke and f/u with recommendations. Patient prefers OV, is concerned BV not resolved. OV scheduled for this morning at 1030 with Dr. Andrena Ke.   Routing to provider for final review. Patient is agreeable to disposition. Will close encounter.

## 2024-03-21 NOTE — Progress Notes (Signed)
 38 y.o. G0P0000 female with subacute vaginitis, endometriosis here for vaginal symptoms. Single.  Patient's last menstrual period was 03/21/2024 (approximate).   On 03/09/24, she reported: She reports a  vaginal burning sensation x 1 year; some burning with urination - pt reports pain mainly in labia area.  Used premarin  vag cream once but it burned really bad, since then she went back to using estrace  vag cream.  Currently in pelvic floor therapy. Reports clitoris pain x 6 months. Used PH friendly vag cream on the clitoris area for 3 days, temp relief. reports a stringing tugging, feeling;  very uncomfortable.  Hx of BV, Denies odor or abnormal discharge.  Today, she reports she was seen for BV at last visit, took Nuvessa  Thursday and has been burning ever since.  Use boric acid prior to Nuvessa .  Feels that this usually helps her body respond to metronidazole  better.  She has not started the MetroGel  for suppression.  Left breast pain x 3 days, painful to touch around nipple area. Period started today.  No nipple discharge or abnormal breast lumps. Benign MMG 12/2023  Birth control: None Sexually active: not currently, recently single  GYN HISTORY: Endometriosis  OB History  Gravida Para Term Preterm AB Living  0 0 0 0 0 0  SAB IAB Ectopic Multiple Live Births  0 0 0 0 0   Past Medical History:  Diagnosis Date   Abdominal pain    Abnormal EKG    Dec 2019 and Jan 2020/ saw cardiologist   Allergy    Altered bowel function    Anemia    Anxiety    Biliary dyskinesia    Celiac disease    Chest tightness    Depression    Dermatitis    Deviated septum    DOE (dyspnea on exertion)    Dysmenorrhea    Endometriosis    Episodic lightheadedness    ETD (Eustachian tube dysfunction), bilateral    Family history of brain aneurysm    Genital warts    GERD (gastroesophageal reflux disease)    Hashimoto's disease    History of meningioma    Hypotension    Insomnia     Interstitial cystitis    Meningioma (HCC)    Mononucleosis    Pelvic pain    Perineal pain    Post-operative nausea and vomiting    Rhinitis    RMSF [facility] spotted fever) 2015   S/P resection of meningioma    Seasonal allergic rhinitis    Tendinitis    Ulcer    Unspecified dyspareunia    Urethral syndrome    Vitamin D  deficiency    Past Surgical History:  Procedure Laterality Date   COLONOSCOPY  2013   CRANIOTOMY  09/2018   laproscopic surgery     2013 and 2019   OVARIAN CYST REMOVAL     TONSILLECTOMY     UPPER GASTROINTESTINAL ENDOSCOPY  2013   Current Outpatient Medications on File Prior to Visit  Medication Sig Dispense Refill   ALPRAZolam  (XANAX ) 0.25 MG tablet Take 0.25 mg by mouth as needed.     Cholecalciferol 125 MCG (5000 UT) capsule Take 5,000 Units by mouth 2 (two) times a week.     clindamycin (CLEOCIN T) 1 % external solution      Cyanocobalamin  1000 MCG SUBL Place 1 tablet under the tongue daily.     cyclobenzaprine  (FLEXERIL ) 10 MG tablet Take 10 mg by mouth 3 (three) times daily as needed.  cyclobenzaprine  (FLEXERIL ) 5 MG tablet Take 5 mg by mouth at bedtime.     estradiol  (ESTRACE  VAGINAL) 0.1 MG/GM vaginal cream Place 1 g vaginally 3 (three) times a week. 42.5 g 3   ferrous sulfate 325 (65 FE) MG EC tablet Take 325 mg by mouth.     hydrOXYzine  (ATARAX ) 25 MG tablet      Lactic Ac-Citric Ac-Pot Bitart (PHEXXI ) 1.8-1-0.4 % GEL Place 5 g vaginally as directed. Before intercourse 180 g 11   Lactoferrin 250 MG CAPS Take 250 mg by mouth daily.     lamoTRIgine (LAMICTAL) 25 MG tablet Take by mouth.     loratadine (CLARITIN) 10 MG tablet      Multiple Vitamin (MULTIVITAMIN) tablet Take 1 tablet by mouth daily. Women's MVI- Take one daily     olopatadine  (PATADAY ) 0.1 % ophthalmic solution      Probiotic Product (FEM-DOPHILUS WOMENS PO)      traMADol (ULTRAM) 50 MG tablet Take 50 mg by mouth every 6 (six) hours as needed for moderate pain (pain score  4-6).     Triamcinolone  Acetonide (TRIAMCINOLONE  0.1 % CREAM : EUCERIN) CREA      UBRELVY 50 MG TABS      UNABLE TO FIND Compounded hemorrhoid cream     VENTOLIN  HFA 108 (90 Base) MCG/ACT inhaler SMARTSIG:1-2 Puff(s) Via Inhaler Every 4-6 Hours PRN     fluticasone  (FLONASE ) 50 MCG/ACT nasal spray  (Patient not taking: Reported on 03/21/2024)     montelukast  (SINGULAIR ) 10 MG tablet Take 10 mg by mouth daily. (Patient not taking: Reported on 03/21/2024)     RABEprazole (ACIPHEX) 20 MG tablet Take 20 mg by mouth daily.     No current facility-administered medications on file prior to visit.   Allergies  Allergen Reactions   Diflucan [Fluconazole] Hives   Gluten Meal Other (See Comments) and Nausea And Vomiting    Celiac       PE Today's Vitals   03/21/24 1052  BP: 102/66  Pulse: 90  Temp: 98.5 F (36.9 C)  TempSrc: Oral  SpO2: 99%  Weight: 166 lb (75.3 kg)   Body mass index is 27.2 kg/m.  Physical Exam Vitals reviewed. Exam conducted with a chaperone present.  Constitutional:      General: She is not in acute distress.    Appearance: Normal appearance.  HENT:     Head: Normocephalic and atraumatic.     Nose: Nose normal.  Eyes:     Extraocular Movements: Extraocular movements intact.     Conjunctiva/sclera: Conjunctivae normal.  Pulmonary:     Effort: Pulmonary effort is normal.  Genitourinary:    General: Normal vulva.     Exam position: Lithotomy position.     Vagina: No vaginal discharge.     Cervix: Normal. No lesion.     Comments: Menses present Musculoskeletal:        General: Normal range of motion.     Cervical back: Normal range of motion.  Neurological:     General: No focal deficit present.     Mental Status: She is alert.  Psychiatric:        Mood and Affect: Mood normal.        Behavior: Behavior normal.       Assessment and Plan:        Vaginal burning -     WET PREP FOR TRICH, YEAST, CLUE -     SureSwab Advanced Vaginitis  Plus,TMA  Breast pain, left Likely  premenstrual symptoms given recent benign mammogram. Recommend that patient document breast pain in relation to her cycle over the next 3 months. Follow-up exam in 3 months.  BV (bacterial vaginosis) -     Cleocin; Place 1 suppository (100 mg total) vaginally at bedtime for 3 days.  Dispense: 3 suppository; Refill: 0 Persistent BV infection after treatment with nuvessa . Recommend switching to clindamycin. Can use PV hydrocortisone  for irritation/burning Discontinue metrogel  for suppression at this time. Discussed weekly boric acid.  Romaine Closs, MD

## 2024-03-22 ENCOUNTER — Ambulatory Visit: Attending: Family Medicine | Admitting: Physical Therapy

## 2024-03-22 ENCOUNTER — Ambulatory Visit: Payer: Self-pay | Admitting: Obstetrics and Gynecology

## 2024-03-22 ENCOUNTER — Ambulatory Visit: Admitting: Physical Therapy

## 2024-03-22 DIAGNOSIS — M62838 Other muscle spasm: Secondary | ICD-10-CM | POA: Diagnosis present

## 2024-03-22 DIAGNOSIS — M6281 Muscle weakness (generalized): Secondary | ICD-10-CM | POA: Diagnosis present

## 2024-03-22 DIAGNOSIS — R279 Unspecified lack of coordination: Secondary | ICD-10-CM | POA: Insufficient documentation

## 2024-03-22 DIAGNOSIS — R293 Abnormal posture: Secondary | ICD-10-CM | POA: Diagnosis present

## 2024-03-22 LAB — SURESWAB® ADVANCED VAGINITIS PLUS,TMA
C. trachomatis RNA, TMA: NOT DETECTED
CANDIDA SPECIES: NOT DETECTED
Candida glabrata: NOT DETECTED
N. gonorrhoeae RNA, TMA: NOT DETECTED
SURESWAB(R) ADV BACTERIAL VAGINOSIS(BV),TMA: NEGATIVE
TRICHOMONAS VAGINALIS (TV),TMA: NOT DETECTED

## 2024-03-22 NOTE — Therapy (Signed)
 OUTPATIENT PHYSICAL THERAPY FEMALE PELVIC TREATMENT   Patient Name: Mary Wyatt MRN: [id number] DOB:26-Feb-1986, 38 y.o., female Today's Date: 03/22/2024  END OF SESSION:  PT End of Session - 03/22/24 1015     Visit Number 11    Date for PT Re-Evaluation 07/02/24    Authorization Type [employer]    Authorization Time Period [employer] approved 4 visits 03/09/2024 - 04/07/2024 auth#[id number]    Authorization - Visit Number 2    Authorization - Number of Visits 4    PT Start Time 1015    PT Stop Time 1054    PT Time Calculation (min) 39 min              Past Medical History:  Diagnosis Date   Abdominal pain    Abnormal EKG    Dec 2019 and Jan 2020/ saw cardiologist   Allergy    Altered bowel function    Anemia    Anxiety    Biliary dyskinesia    Celiac disease    Chest tightness    Depression    Dermatitis    Deviated septum    DOE (dyspnea on exertion)    Dysmenorrhea    Endometriosis    Episodic lightheadedness    ETD (Eustachian tube dysfunction), bilateral    Family history of brain aneurysm    Genital warts    GERD (gastroesophageal reflux disease)    Hashimoto's disease    History of meningioma    Hypotension    Insomnia    Interstitial cystitis    Meningioma (HCC)    Mononucleosis    Pelvic pain    Perineal pain    Post-operative nausea and vomiting    Rhinitis    [facility] spotted fever) 2015   S/P resection of meningioma    Seasonal allergic rhinitis    Tendinitis    Ulcer    Unspecified dyspareunia    Urethral syndrome    Vitamin D  deficiency    Past Surgical History:  Procedure Laterality Date   COLONOSCOPY  2013   CRANIOTOMY  09/2018   laproscopic surgery     2013 and 2019   OVARIAN CYST REMOVAL     TONSILLECTOMY     UPPER GASTROINTESTINAL ENDOSCOPY  2013   Patient Active Problem List   Diagnosis Date Noted   Hypotension - chronic 09/19/2021   Anxiety and depression 06/15/2017   Family history of brain aneurysm  04/19/2017   Rhinitis, allergic 04/20/2014   Generalized anxiety disorder 04/20/2014   Celiac disease 12/15/2012   Interstitial cystitis     PCP: Audria Leather, MD   REFERRING PROVIDER: Audria Leather, MD   REFERRING DIAG: [id number] (ICD-10-CM) - Vulvodynia N34.3 (ICD-10-CM) - Urethral syndrome R10.2 (ICD-10-CM) - Vaginal pain  THERAPY DIAG:  Other muscle spasm  Muscle weakness (generalized)  Abnormal posture  Rationale for Evaluation and Treatment: Rehabilitation  ONSET DATE: 1.5 years ago  SUBJECTIVE:  SUBJECTIVE STATEMENT: Has been packing a lot and low back is sore. Tried boric acid for BV and had worse burning than with BV so started a medication from MD. Has to wait to start BV meds until after period (currently on).   Fluid intake: water - 70 oz, coffee in am, black tea in afternoon, sometimes V8.   PAIN:  6/3 Are you having pain? Yes NPRS scale: 4/10 Pain location: back  Pain type: dull achy Pain description: intermittent and     Aggravating factors: standing for 20 mins Relieving factors: mobility   PRECAUTIONS: None  RED FLAGS: None   WEIGHT BEARING RESTRICTIONS: No  FALLS:  Has patient fallen in last 6 months? No  OCCUPATION: not currently   ACTIVITY LEVEL : active at home but less exercise.   PLOF: Independent  PATIENT GOALS: to be more comfortable and no urine leakage   PERTINENT HISTORY:  recurrent BV and yeast,anxiety, celiac disease, depression, endometriosis, genital warts, IC, CRANIOTOMY 2019,  Sexual abuse: No  BOWEL MOVEMENT: Pain with bowel movement: No Type of bowel movement:Type (Bristol Stool Scale) 4, Frequency daily, and Strain sometimes (does have hemorrhoids) Fully empty rectum: Yes: but has two morning bowel movements  Leakage:  No Pads: No Fiber supplement/laxative No  URINATION: Pain with urination: No Fully empty bladder: Yes:   Stream: Strong Urgency: No Frequency: sometimes pending on the day 45 mins - hour Leakage: Sneezing and post void dribble Pads: No  INTERCOURSE:  Ability to have vaginal penetration Yes  Pain with intercourse: After Intercourse Dryness No doesn't feel dry during intercourse but feels sore after Climax: not painful, but was having a little pain at clitoris recently and did have hurt to climax but resolved  Marinoff Scale: 0/3  PREGNANCY: Vaginal deliveries 0  C-section deliveries 0 Currently pregnant No  PROLAPSE: None   OBJECTIVE:  Note: Objective measures were completed at Evaluation unless otherwise noted.  DIAGNOSTIC FINDINGS:    PATIENT SURVEYS:    PFIQ-7: 57 01/27/24 - 48 02/24/24: 38 03/01/24: 38 COGNITION: Overall cognitive status: Within functional limits for tasks assessed     SENSATION: Light touch: Deficits reports tingling and numbness in bil hands/feet that comes and goes - has neuro appointment  LUMBAR SPECIAL TESTS:  Single leg stance test: hip drop noted 5s on Lt stance leg and at 7s Rt stance leg, mild low back pain noted with either side, SI Compression/distraction test: Negative, and FABER test: Negative  FUNCTIONAL TESTS:  Functional squat - decreased descent by 50% and bil valgus noted  GAIT: WFL  POSTURE: rounded shoulders, forward head, and posterior pelvic tilt   LUMBARAROM/PROM:  A/PROM A/PROM  eval  Flexion Limited by 25%  Extension WFL  Right lateral flexion Limited by 25%  Left lateral flexion Limited by 25%  Right rotation Limited by 25%  Left rotation Limited by 25%   (Blank rows = not tested)  LOWER EXTREMITY ROM:  Bil hamstrings and adductors limited by 25% and tightness noted in bil hip ER and IR.   LOWER EXTREMITY MMT:  Bil hip abduction 3+/5 all other hips grossly 4/5; knees 5/5 PALPATION:   General:  tightness noted in bil lumbar and thoracic paraspinals, bil gluteals  Pelvic Alignment: WFL  Abdominal: mld TTP lower quadrants, fascial restrictions throughout                 External Perineal Exam: decreased mobility at clitoral hood with dryness noted throughout vulva  Internal Pelvic Floor: TTP throughout bil superficial and deep layers, trigger points noted throughout Rt obturator internus, pubococcygeus and bulbocavernosus   Patient confirms identification and approves PT to assess internal pelvic floor and treatment Yes No emotional/communication barriers or cognitive limitation. Patient is motivated to learn. Patient understands and agrees with treatment goals and plan. PT explains patient will be examined in standing, sitting, and lying down to see how their muscles and joints work. When they are ready, they will be asked to remove their underwear so PT can examine their perineum. The patient is also given the option of providing their own chaperone as one is not provided in our facility. The patient also has the right and is explained the right to defer or refuse any part of the evaluation or treatment including the internal exam. With the patient's consent, PT will use one gloved finger to gently assess the muscles of the pelvic floor, seeing how well it contracts and relaxes and if there is muscle symmetry. After, the patient will get dressed and PT and patient will discuss exam findings and plan of care. PT and patient discuss plan of care, schedule, attendance policy and HEP activities.  PELVIC MMT:   MMT eval 01/27/24  Vaginal 3/5; 8s, 6 reps 4/5  Internal Anal Sphincter    External Anal Sphincter    Puborectalis    Diastasis Recti    (Blank rows = not tested)        TONE: Slightly increased 4/9 - continues to be increased   PROLAPSE: Not seen in hooklying with cough  TODAY'S TREATMENT:                                                                                                                               DATE: 03/08/24: External manual for clitoral hood mobilizations in upward and bil directions for decreased pain with arousal and climax. Pt had restrictions in all directions but more so at right today at at right ischiocavernosus and did report replicated pain with these stretches but tolerated well and demonstrated moderate release but not resolved.  Hand hygiene completed and gloves changed internal pelvic floor manual at bil obturators with Lt tighter than Rt. And while completing manual pt educated on vibrator or wand use for these releases at home, diaphragmatic breathing, relaxation for pelvic floor pain but also to continue to implement daily activity (walks, yoga, HEP/strengthening) pt agreed.   03/15/24: Opp hand/ knee ball press alt 2x10 Foam roller glutes x10, foam roller vertical thoracic extension 1 min Bird dogs 2x10 Tail wags x10 Piriformis stretch 2x30s  Happy baby seated 2x30s X10 squats with cues for technique Pelvic tilts x10 Manual at Rt gluteal, glute max, and lateral fibers of obturator - hands on STM with pt verbalizing TTP and PT felt taut muscle bands.  Improvement noted and pt educated on continued alternating stretching and strengthening days at home.   03/22/24: X10 pelvic drops with ball  sitting X10 circles sitting on a ball bil Seated happy baby on ball 3x30s Foam roller thoracic extension 1 min Then able to lay in hooklying in hooklying without pain Bridges 2x10  Discussed upcoming discharge from PT in the next 1-2 visits. Pt agreeable and continues to be pleased with pelvic floor improvement. Does still have limitations with back pain (though is better as well but would like to focus on improving this)   PATIENT EDUCATION:  Education details: [id number], urge drill  Person educated: Patient Education method: Explanation, Demonstration, Tactile cues, Verbal cues, and Handouts Education  comprehension: verbalized understanding, returned demonstration, verbal cues required, tactile cues required, and needs further education  HOME EXERCISE PROGRAM: Access Code: [email or web address] URL: [email or web address] Date: 02/09/2024 Prepared by: Zamariah Seaborn Exercises - Seated Diaphragmatic Breathing  - 1 x daily - 7 x weekly - 2 sets - 10 reps - Supine Diaphragmatic Breathing  - 1 x daily - 7 x weekly - 2 sets - 10 reps - Supine Lower Trunk Rotation  - 1 x daily - 7 x weekly - 1 sets - 10 reps - 5s holds - Supine Butterfly Groin Stretch  - 1 x daily - 7 x weekly - 3 sets - 30s hold - Supine Figure 4 Piriformis Stretch  - 1 x daily - 7 x weekly - 1 sets - 3 reps - 30s holds - Supine Single Knee to Chest  - 1 x daily - 7 x weekly - 1 sets - 3 reps - 30s holds - Diaphragmatic Breathing in Child's Pose with Pelvic Floor Relaxation  - 1 x daily - 7 x weekly - 3 sets - 30s hold - Figure 4 Gluteus Mobilization on Foam Roll  - 1 x daily - 7 x weekly - 1 sets - 10 reps  ASSESSMENT:  CLINICAL IMPRESSION: Patient is a 37 y.o. female  who was seen today for physical therapy treatment for pelvic pain, vulva pain, increased urinary frequency and leakage. Pt tolerated session well, had decreased pain at end of session. Pt reports she feels her pelvic floor pain has greatly improved but is having burning pain with BV. Otherwise improving. Pt would benefit from additional PT to further address deficits to tolerate more strengthening and decreased pain and improve stability.   OBJECTIVE IMPAIRMENTS: decreased activity tolerance, decreased coordination, decreased endurance, decreased mobility, decreased strength, increased fascial restrictions, increased muscle spasms, impaired flexibility, improper body mechanics, postural dysfunction, and pain.   ACTIVITY LIMITATIONS: carrying, lifting, standing, squatting, continence, and locomotion level  PARTICIPATION LIMITATIONS: interpersonal relationship and  community activity  PERSONAL FACTORS: Fitness, Time since onset of injury/illness/exacerbation, and 1 comorbidity: medical history  are also affecting patient's functional outcome.   REHAB POTENTIAL: Good  CLINICAL DECISION MAKING: Stable/uncomplicated  EVALUATION COMPLEXITY: Low   GOALS: Goals reviewed with patient? Yes  SHORT TERM GOALS: Target date: 01/28/24  Pt to be I with HEP.  Baseline: Goal status: MET  2.  Pt to be I with diaphragmatic breathing and pelvic floor mobility to decreased strain at pelvic floor and pain levels Baseline:  Goal status:  MET  3.  Pt to be I with urge drill to decreased frequency of urine and improved ability to complete work day without frequent bathroom breaks.  Baseline:  Goal status:  MET  4.  Pt to be with knack to decreased urinary incontinence with stressors.  Baseline:  Goal status: MET 4/29  LONG TERM GOALS: Target date: 07/02/2024  Pt to be I with advanced HEP.  Baseline:  Goal status: MET 4/29  2.  Pt will be able to go 2-3 hours in between voids without urgency or incontinence in order to improve QOL and perform all functional activities with less difficulty.   Baseline:  Goal status: on going for consistency  3.  Pt will increase all impaired lumbar A/ROM by 25% without pain.  Baseline:  Goal status: MET 5/7  4.  Pt to report improved tolerance to walking at least 30 mins without increased pain for returning healthy exercise levels.  Baseline:  Goal status: MET 5/7  5.  Pt to report no more than 1/10 pelvic pain for improved QOL.  Baseline:  Goal status: on going for consistency  6.  Pt to report no more than one instance of urinary incontinence in a week for improved QOL.  Baseline:  Goal status: MET 4/29  PLAN:  PT FREQUENCY: 2x/week  PT DURATION: 12 sessions  PLANNED INTERVENTIONS: 97110-Therapeutic exercises, 97530- Therapeutic activity, 97112- Neuromuscular re-education, 97535- Self Care, 16109- Manual  therapy, Patient/Family education, Balance training, Taping, Dry Needling, Joint mobilization, Spinal mobilization, Scar mobilization, DME instructions, Cryotherapy, Moist heat, and Biofeedback  PLAN FOR NEXT SESSION: internal as needed and pt consents, hip and trunk stretching, core and hip strengthening, voiding mechanics, coordination of pelvic floor    Avie Lemme, PT, DPT 03/22/2510:13 AM

## 2024-03-24 ENCOUNTER — Ambulatory Visit: Payer: Self-pay | Admitting: Physical Therapy

## 2024-03-30 ENCOUNTER — Ambulatory Visit: Payer: Self-pay | Admitting: Physical Therapy

## 2024-03-30 DIAGNOSIS — M62838 Other muscle spasm: Secondary | ICD-10-CM

## 2024-03-30 DIAGNOSIS — R279 Unspecified lack of coordination: Secondary | ICD-10-CM

## 2024-03-30 DIAGNOSIS — R293 Abnormal posture: Secondary | ICD-10-CM

## 2024-03-30 DIAGNOSIS — M6281 Muscle weakness (generalized): Secondary | ICD-10-CM

## 2024-03-30 NOTE — Therapy (Signed)
 OUTPATIENT PHYSICAL THERAPY FEMALE PELVIC TREATMENT   Patient Name: Mary Wyatt MRN: [id number] DOB:07/12/1986, 38 y.o., female Today's Date: 03/30/2024  END OF SESSION:  PT End of Session - 03/30/24 1446     Visit Number 12    Date for PT Re-Evaluation 07/02/24    Authorization Type [employer]    Authorization Time Period [employer] approved 4 visits 03/09/2024 - 04/07/2024 auth#[id number]    Authorization - Visit Number 3    Authorization - Number of Visits 4    PT Start Time 1445    PT Stop Time 1526    PT Time Calculation (min) 41 min    Activity Tolerance Patient tolerated treatment well    Behavior During Therapy [facility] for tasks assessed/performed              Past Medical History:  Diagnosis Date   Abdominal pain    Abnormal EKG    Dec 2019 and Jan 2020/ saw cardiologist   Allergy    Altered bowel function    Anemia    Anxiety    Biliary dyskinesia    Celiac disease    Chest tightness    Depression    Dermatitis    Deviated septum    DOE (dyspnea on exertion)    Dysmenorrhea    Endometriosis    Episodic lightheadedness    ETD (Eustachian tube dysfunction), bilateral    Family history of brain aneurysm    Genital warts    GERD (gastroesophageal reflux disease)    Hashimoto's disease    History of meningioma    Hypotension    Insomnia    Interstitial cystitis    Meningioma (HCC)    Mononucleosis    Pelvic pain    Perineal pain    Post-operative nausea and vomiting    Rhinitis    RMS[facility] spotted fever) 2015   S/P resection of meningioma    Seasonal allergic rhinitis    Tendinitis    Ulcer    Unspecified dyspareunia    Urethral syndrome    Vitamin D  deficiency    Past Surgical History:  Procedure Laterality Date   COLONOSCOPY  2013   CRANIOTOMY  09/2018   laproscopic surgery     2013 and 2019   OVARIAN CYST REMOVAL     TONSILLECTOMY     UPPER GASTROINTESTINAL ENDOSCOPY  2013   Patient Active Problem List   Diagnosis  Date Noted   Hypotension - chronic 09/19/2021   Anxiety and depression 06/15/2017   Family history of brain aneurysm 04/19/2017   Rhinitis, allergic 04/20/2014   Generalized anxiety disorder 04/20/2014   Celiac disease 12/15/2012   Interstitial cystitis     PCP: Audria Leather, MD   REFERRING PROVIDER: Audria Leather, MD   REFERRING DIAG: [phone] (ICD-10-CM) - Vulvodynia N34.3 (ICD-10-CM) - Urethral syndrome R10.2 (ICD-10-CM) - Vaginal pain  THERAPY DIAG:  Other muscle spasm  Muscle weakness (generalized)  Abnormal posture  Unspecified lack of coordination  Rationale for Evaluation and Treatment: Rehabilitation  ONSET DATE: 1.5 years ago  SUBJECTIVE:  SUBJECTIVE STATEMENT: Back pain is still bothersome but has been moving her old apartment a lot.   Fluid intake: water - 70 oz, coffee in am, black tea in afternoon, sometimes V8.   PAIN:   Are you having pain? Yes NPRS scale: 6/10 Pain location: back  Pain type: dull achy Pain description: intermittent and     Aggravating factors: standing for 20 mins Relieving factors: mobility   PRECAUTIONS: None  RED FLAGS: None   WEIGHT BEARING RESTRICTIONS: No  FALLS:  Has patient fallen in last 6 months? No  OCCUPATION: not currently   ACTIVITY LEVEL : active at home but less exercise.   PLOF: Independent  PATIENT GOALS: to be more comfortable and no urine leakage   PERTINENT HISTORY:  recurrent BV and yeast,anxiety, celiac disease, depression, endometriosis, genital warts, IC, CRANIOTOMY 2019,  Sexual abuse: No  BOWEL MOVEMENT: Pain with bowel movement: No Type of bowel movement:Type (Bristol Stool Scale) 4, Frequency daily, and Strain sometimes (does have hemorrhoids) Fully empty rectum: Yes: but has two morning  bowel movements  Leakage: No Pads: No Fiber supplement/laxative No  URINATION: Pain with urination: No Fully empty bladder: Yes:   Stream: Strong Urgency: No Frequency: sometimes pending on the day 45 mins - hour Leakage: Sneezing and post void dribble Pads: No  INTERCOURSE:  Ability to have vaginal penetration Yes  Pain with intercourse: After Intercourse Dryness No doesn't feel dry during intercourse but feels sore after Climax: not painful, but was having a little pain at clitoris recently and did have hurt to climax but resolved  Marinoff Scale: 0/3  PREGNANCY: Vaginal deliveries 0  C-section deliveries 0 Currently pregnant No  PROLAPSE: None   OBJECTIVE:  Note: Objective measures were completed at Evaluation unless otherwise noted.  DIAGNOSTIC FINDINGS:    PATIENT SURVEYS:    PFIQ-7: 57 01/27/24 - 48 02/24/24: 38 03/01/24: 38 COGNITION: Overall cognitive status: Within functional limits for tasks assessed     SENSATION: Light touch: Deficits reports tingling and numbness in bil hands/feet that comes and goes - has neuro appointment  LUMBAR SPECIAL TESTS:  Single leg stance test: hip drop noted 5s on Lt stance leg and at 7s Rt stance leg, mild low back pain noted with either side, SI Compression/distraction test: Negative, and FABER test: Negative  FUNCTIONAL TESTS:  Functional squat - decreased descent by 50% and bil valgus noted  GAIT: WFL  POSTURE: rounded shoulders, forward head, and posterior pelvic tilt   LUMBARAROM/PROM:  A/PROM A/PROM  eval  Flexion Limited by 25%  Extension WFL  Right lateral flexion Limited by 25%  Left lateral flexion Limited by 25%  Right rotation Limited by 25%  Left rotation Limited by 25%   (Blank rows = not tested)  LOWER EXTREMITY ROM:  Bil hamstrings and adductors limited by 25% and tightness noted in bil hip ER and IR.   LOWER EXTREMITY MMT:  Bil hip abduction 3+/5 all other hips grossly 4/5; knees  5/5 PALPATION:   General: tightness noted in bil lumbar and thoracic paraspinals, bil gluteals  Pelvic Alignment: WFL  Abdominal: mld TTP lower quadrants, fascial restrictions throughout                 External Perineal Exam: decreased mobility at clitoral hood with dryness noted throughout vulva                             Internal Pelvic Floor: TTP  throughout bil superficial and deep layers, trigger points noted throughout Rt obturator internus, pubococcygeus and bulbocavernosus   Patient confirms identification and approves PT to assess internal pelvic floor and treatment Yes No emotional/communication barriers or cognitive limitation. Patient is motivated to learn. Patient understands and agrees with treatment goals and plan. PT explains patient will be examined in standing, sitting, and lying down to see how their muscles and joints work. When they are ready, they will be asked to remove their underwear so PT can examine their perineum. The patient is also given the option of providing their own chaperone as one is not provided in our facility. The patient also has the right and is explained the right to defer or refuse any part of the evaluation or treatment including the internal exam. With the patient's consent, PT will use one gloved finger to gently assess the muscles of the pelvic floor, seeing how well it contracts and relaxes and if there is muscle symmetry. After, the patient will get dressed and PT and patient will discuss exam findings and plan of care. PT and patient discuss plan of care, schedule, attendance policy and HEP activities.  PELVIC MMT:   MMT eval 01/27/24  Vaginal 3/5; 8s, 6 reps 4/5  Internal Anal Sphincter    External Anal Sphincter    Puborectalis    Diastasis Recti    (Blank rows = not tested)        TONE: Slightly increased 4/9 - continues to be increased   PROLAPSE: Not seen in hooklying with cough  TODAY'S TREATMENT:                                                                                                                               DATE: 03/15/24: Opp hand/ knee ball press alt 2x10 Foam roller glutes x10, foam roller vertical thoracic extension 1 min Bird dogs 2x10 Tail wags x10 Piriformis stretch 2x30s  Happy baby seated 2x30s X10 squats with cues for technique Pelvic tilts x10 Manual at Rt gluteal, glute max, and lateral fibers of obturator - hands on STM with pt verbalizing TTP and PT felt taut muscle bands.  Improvement noted and pt educated on continued alternating stretching and strengthening days at home.   03/22/24: X10 pelvic drops with ball sitting X10 circles sitting on a ball bil Seated happy baby on ball 3x30s Foam roller thoracic extension 1 min Then able to lay in hooklying in hooklying without pain Bridges 2x10  Discussed upcoming discharge from PT in the next 1-2 visits. Pt agreeable and continues to be pleased with pelvic floor improvement. Does still have limitations with back pain (though is better as well but would like to focus on improving this)  03/30/24: Pt reports she does want to go to the pool for more therapy for improved back pain (mid to low). Is limited with moving back in parents and having limited room and time.  Open books x10 each  Single knee to chest 3x30s each Butterfly 10x10s  Piriformis 3x30s Abdominal manual fascial massage with pt in supine - x10 CW/CCW and cues for diaphragmatic breathing   PATIENT EDUCATION:  Education details: [id number], urge drill  Person educated: Patient Education method: Explanation, Demonstration, Tactile cues, Verbal cues, and Handouts Education comprehension: verbalized understanding, returned demonstration, verbal cues required, tactile cues required, and needs further education  HOME EXERCISE PROGRAM: Access Code: [email or web address] URL: [email or web address] Date: 02/09/2024 Prepared by: Demaree Liberto Exercises - Seated Diaphragmatic Breathing  - 1 x  daily - 7 x weekly - 2 sets - 10 reps - Supine Diaphragmatic Breathing  - 1 x daily - 7 x weekly - 2 sets - 10 reps - Supine Lower Trunk Rotation  - 1 x daily - 7 x weekly - 1 sets - 10 reps - 5s holds - Supine Butterfly Groin Stretch  - 1 x daily - 7 x weekly - 3 sets - 30s hold - Supine Figure 4 Piriformis Stretch  - 1 x daily - 7 x weekly - 1 sets - 3 reps - 30s holds - Supine Single Knee to Chest  - 1 x daily - 7 x weekly - 1 sets - 3 reps - 30s holds - Diaphragmatic Breathing in Child's Pose with Pelvic Floor Relaxation  - 1 x daily - 7 x weekly - 3 sets - 30s hold - Figure 4 Gluteus Mobilization on Foam Roll  - 1 x daily - 7 x weekly - 1 sets - 10 reps  ASSESSMENT:  CLINICAL IMPRESSION: Patient is a 38 y.o. female  who was seen today for physical therapy treatment for pelvic pain, vulva pain, increased urinary frequency and leakage. Pt tolerated session well, had decreased pain at end of session. Back pain is biggest complaint right now, reports if she doesn't have BV her burning doesn't happen and outside of that her pelvic pain is no longer bothering her. Pt pleased with progress and wants to focus on back pain currently. Tolerated stretches well and understands DC today so pt can focus on back PT and more aquatic sessions.   OBJECTIVE IMPAIRMENTS: decreased activity tolerance, decreased coordination, decreased endurance, decreased mobility, decreased strength, increased fascial restrictions, increased muscle spasms, impaired flexibility, improper body mechanics, postural dysfunction, and pain.   ACTIVITY LIMITATIONS: carrying, lifting, standing, squatting, continence, and locomotion level  PARTICIPATION LIMITATIONS: interpersonal relationship and community activity  PERSONAL FACTORS: Fitness, Time since onset of injury/illness/exacerbation, and 1 comorbidity: medical history  are also affecting patient's functional outcome.   REHAB POTENTIAL: Good  CLINICAL DECISION MAKING:  Stable/uncomplicated  EVALUATION COMPLEXITY: Low   GOALS: Goals reviewed with patient? Yes  SHORT TERM GOALS: Target date: 01/28/24  Pt to be I with HEP.  Baseline: Goal status: MET  2.  Pt to be I with diaphragmatic breathing and pelvic floor mobility to decreased strain at pelvic floor and pain levels Baseline:  Goal status:  MET  3.  Pt to be I with urge drill to decreased frequency of urine and improved ability to complete work day without frequent bathroom breaks.  Baseline:  Goal status:  MET  4.  Pt to be with knack to decreased urinary incontinence with stressors.  Baseline:  Goal status: MET 4/29  LONG TERM GOALS: Target date: 07/02/2024  Pt to be I with advanced HEP.  Baseline:  Goal status: MET 4/29  2.  Pt will be able to go 2-3 hours in between voids without urgency or incontinence in order to improve  QOL and perform all functional activities with less difficulty.   Baseline:  Goal status: MET  3.  Pt will increase all impaired lumbar A/ROM by 25% without pain.  Baseline:  Goal status: MET 5/7  4.  Pt to report improved tolerance to walking at least 30 mins without increased pain for returning healthy exercise levels.  Baseline:  Goal status: MET 5/7  5.  Pt to report no more than 1/10 pelvic pain for improved QOL.  Baseline:  Goal status: MET (outside of if she hs BV)  6.  Pt to report no more than one instance of urinary incontinence in a week for improved QOL.  Baseline:  Goal status: MET 4/29  PLAN:  PT FREQUENCY: 2x/week  PT DURATION: 12 sessions  PLANNED INTERVENTIONS: 97110-Therapeutic exercises, 97530- Therapeutic activity, 97112- Neuromuscular re-education, 97535- Self Care, 16109- Manual therapy, Patient/Family education, Balance training, Taping, Dry Needling, Joint mobilization, Spinal mobilization, Scar mobilization, DME instructions, Cryotherapy, Moist heat, and Biofeedback  PLAN FOR NEXT SESSION:   PHYSICAL THERAPY DISCHARGE  SUMMARY  Visits from Start of Care: 12  Current functional level related to goals / functional outcomes: All goals met   Remaining deficits: Back pain - PT for this separately    Education / Equipment: HEP   Patient agrees to discharge. Patient goals were met. Patient is being discharged due to meeting the stated rehab goals.   Avie Lemme, PT, DPT 06/11/252:52 PM

## 2024-04-04 ENCOUNTER — Other Ambulatory Visit: Payer: Self-pay

## 2024-04-04 ENCOUNTER — Encounter: Payer: Self-pay | Admitting: Physical Therapy

## 2024-04-04 ENCOUNTER — Ambulatory Visit: Attending: Pain Medicine | Admitting: Physical Therapy

## 2024-04-04 ENCOUNTER — Ambulatory Visit

## 2024-04-04 DIAGNOSIS — R262 Difficulty in walking, not elsewhere classified: Secondary | ICD-10-CM | POA: Diagnosis present

## 2024-04-04 DIAGNOSIS — M62838 Other muscle spasm: Secondary | ICD-10-CM | POA: Diagnosis present

## 2024-04-04 DIAGNOSIS — M5459 Other low back pain: Secondary | ICD-10-CM | POA: Insufficient documentation

## 2024-04-04 DIAGNOSIS — R293 Abnormal posture: Secondary | ICD-10-CM | POA: Diagnosis present

## 2024-04-04 DIAGNOSIS — R279 Unspecified lack of coordination: Secondary | ICD-10-CM | POA: Insufficient documentation

## 2024-04-04 DIAGNOSIS — R252 Cramp and spasm: Secondary | ICD-10-CM | POA: Insufficient documentation

## 2024-04-04 DIAGNOSIS — M6281 Muscle weakness (generalized): Secondary | ICD-10-CM | POA: Insufficient documentation

## 2024-04-04 NOTE — Therapy (Signed)
 OUTPATIENT PHYSICAL THERAPY THORACOLUMBAR EVALUATION   Patient Name: Mary Wyatt MRN: [id number] DOB:Aug 30, 1986, 38 y.o., female Today's Date: 04/04/2024  END OF SESSION:  PT End of Session - 04/04/24 1149     Visit Number 1    Date for PT Re-Evaluation 05/30/24    Authorization Type healthy blue    PT Start Time 1145    PT Stop Time 1216    PT Time Calculation (min) 31 min    Activity Tolerance Patient tolerated treatment well    Behavior During Therapy [facility] for tasks assessed/performed          Past Medical History:  Diagnosis Date   Abdominal pain    Abnormal EKG    Dec 2019 and Jan 2020/ saw cardiologist   Allergy    Altered bowel function    Anemia    Anxiety    Biliary dyskinesia    Celiac disease    Chest tightness    Depression    Dermatitis    Deviated septum    DOE (dyspnea on exertion)    Dysmenorrhea    Endometriosis    Episodic lightheadedness    ETD (Eustachian tube dysfunction), bilateral    Family history of brain aneurysm    Genital warts    GERD (gastroesophageal reflux disease)    Hashimoto's disease    History of meningioma    Hypotension    Insomnia    Interstitial cystitis    Meningioma (HCC)    Mononucleosis    Pelvic pain    Perineal pain    Post-operative nausea and vomiting    Rhinitis    RMSF [facility] spotted fever) 2015   S/P resection of meningioma    Seasonal allergic rhinitis    Tendinitis    Ulcer    Unspecified dyspareunia    Urethral syndrome    Vitamin D  deficiency    Past Surgical History:  Procedure Laterality Date   COLONOSCOPY  2013   CRANIOTOMY  09/2018   laproscopic surgery     2013 and 2019   OVARIAN CYST REMOVAL     TONSILLECTOMY     UPPER GASTROINTESTINAL ENDOSCOPY  2013   Patient Active Problem List   Diagnosis Date Noted   Hypotension - chronic 09/19/2021   Anxiety and depression 06/15/2017   Family history of brain aneurysm 04/19/2017   Rhinitis, allergic 04/20/2014    Generalized anxiety disorder 04/20/2014   Celiac disease 12/15/2012   Interstitial cystitis     PCP: Audria Leather, MD  REFERRING PROVIDER: Trudi Fus, MD   REFERRING DIAG: M54.50 (ICD-10-CM) - Low back pain, unspecified  Rationale for Evaluation and Treatment: Rehabilitation  THERAPY DIAG:  Other low back pain  Muscle weakness (generalized)  Abnormal posture  Unspecified lack of coordination  Other muscle spasm  ONSET DATE: years  SUBJECTIVE:  SUBJECTIVE STATEMENT: Back pain for several years now, feels limited in her ability to return to working out, standing for more than 5 mins makes it worse. Tries to walk daily but pain gets worse with more time and/or distance.   PERTINENT HISTORY:  recurrent BV and yeast,anxiety, celiac disease, depression, endometriosis, genital warts, IC, CRANIOTOMY 2019,   PAIN:  Are you having pain? Yes: NPRS scale: 5/10 but worsens with activity Pain location: mid to low back bil Pain description: spasm in mid back (with standing/activity), low back is constant dull achy Aggravating factors: walking with mild increase; static standing starts to spasm after 5 mins, stairs Relieving factors: mid back with rest; low back is constant  PRECAUTIONS: None  RED FLAGS: None   WEIGHT BEARING RESTRICTIONS: No  FALLS:  Has patient fallen in last 6 months? No  LIVING ENVIRONMENT: Lives with: lives with their family Lives in: House/apartment Stairs: No Has following equipment at home: None  OCCUPATION: not currently  PLOF: Independent  PATIENT GOALS: to have less pain, wants to be able to stand for 2 hours for better tolerance for working and social activities  NEXT MD VISIT: 04/12/24 MRI scheduled for spine  OBJECTIVE:  Note: Objective measures  were completed at Evaluation unless otherwise noted.  DIAGNOSTIC FINDINGS:  Impression: MRI spine lumbar without contrast 09/2024 Small right extra foraminal disc protrusion at L4-5, closely approximating and potentially irritation the exiting right L4 nerve root Central disc protrusion at L5-S1, closely approximating the descending S1 nerve roots without overt neural impingement   Impression:normal findings at thoracic spine with MRI 09/2024  Impression: MRI CERVICAL WITHOUT CONTRAST 09/2024 Small central disc protrusion with uncovertebral spurring at C5-6 with resultant borderline mild spinal stenosis with mild right C6 foraminal narrowing Minimal disc bulging at C4-5 and C6-7 without stenosis or impingement    PATIENT SURVEYS:   Oswestry Score: 21 / 50 or 42 %  COGNITION: Overall cognitive status: Within functional limits for tasks assessed     SENSATION: Reports some pins and needles constant in bil feet (feels like electric current); and reports sometimes has tingling in bil hands but not consistent and not worse with pain   MUSCLE LENGTH: Hamstrings: bil limited by 25% Adductors limited by 25% bil and trigger points present bil as well   POSTURE: rounded shoulders, increased thoracic kyphosis, and posterior pelvic tilt  PALPATION: TTP throughout midline and bil paraspinals in lumbar spine but worse at Lt side; Lt SIJ most tender area, bil piriformis also TTP  LUMBAR ROM:   AROM eval  Flexion Pain with immediate flexion mobility but able to complete and limited by 25%  Extension Limited by 25%  Right lateral flexion Fingertips to 2 in above knee with pain  Left lateral flexion Fingertips to 2 in above knee with pain  Right rotation WFL  Left rotation WFL   (Blank rows = not tested)  LOWER EXTREMITY ROM:     WFL but pain with bil hip flexion, bil hip ER  LOWER EXTREMITY MMT:    Bil hips grossly 4+/5 except for flexion and abduction 4/5  LUMBAR SPECIAL TESTS:   Straight leg raise test: pain bil in low back, Single leg stance test: no pain either side, SI Compression/distraction test: compression improved pain, and FABER test: Negative  FUNCTIONAL TESTS:  Functional squat - mild bil knee valgus and reports back pain at full end range of squat   Sit up test - 2/3 GAIT: Distance walked: 250' Assistive device utilized:  None Level of assistance: Complete Independence Comments: decreased bil step height, decreased   TREATMENT DATE:   Examination completed, findings reviewed, pt educated on POC, HEP. Pt motivated to participate in PT and agreeable to attempt recommendations.   For all possible CPT codes, reference the Planned Interventions line above.     If treatment provided at initial evaluation, no treatment charged due to lack of authorization.       PATIENT EDUCATION:  Education details: [id number]  Person educated: Patient Education method: Explanation, Demonstration, Tactile cues, Verbal cues, and Handouts Education comprehension: verbalized understanding, returned demonstration, verbal cues required, tactile cues required, and needs further education  HOME EXERCISE PROGRAM: [id number]   ASSESSMENT:  CLINICAL IMPRESSION: Patient is a 38 y.o. female  who was seen today for physical therapy evaluation and treatment for back pain. Pt has had recent pelvic floor PT and did have improvements with this but back pain persists.  Pt demonstrates decreased core strength evident by 2/3 scoring of sit up test, decreased hip strength with decreased MMTs, vaglus knees with squatting, and poor single leg stance balance. ODI scoring reporting 42% rating, indicating 42% disability in function due to pain perceived by pt. Pt would benefit from the buoyancy of aquatic PT for improved tolerance to land based exercises without worsening pain. Pt would benefit from additional PT to further address deficits.    OBJECTIVE IMPAIRMENTS: decreased activity tolerance,  decreased coordination, decreased endurance, decreased mobility, difficulty walking, decreased ROM, decreased strength, increased fascial restrictions, impaired perceived functional ability, increased muscle spasms, impaired flexibility, impaired sensation, improper body mechanics, postural dysfunction, and pain.   ACTIVITY LIMITATIONS: carrying, lifting, bending, sitting, standing, squatting, stairs, and locomotion level  PARTICIPATION LIMITATIONS: meal prep, cleaning, laundry, driving, shopping, community activity, and yard work  PERSONAL FACTORS: Fitness, Past/current experiences, and Time since onset of injury/illness/exacerbation are also affecting patient's functional outcome.   REHAB POTENTIAL: Good  CLINICAL DECISION MAKING: Stable/uncomplicated  EVALUATION COMPLEXITY: Low   GOALS: Goals reviewed with patient? Yes  SHORT TERM GOALS: Target date: 05/02/24  Pt to be I with HEP for carry over and continuing recommendations for improved outcomes.   Baseline: Goal status: INITIAL  2.  Pt to report ability to tolerate standing at least 30 mins without worsening of pain for ability to complete vacuuming at home.  Baseline: 5 mins with worsening pain Goal status: INITIAL  3.  Pt to demonstrate ability to complete x10 body weight squats without worsening pain for improved ability to tolerate hip and core strengthening for returning to work outs.  Baseline:  Goal status: INITIAL     LONG TERM GOALS: Target date: 05/30/24  Pt to be I with advanced HEP for carry over and continuing recommendations for improved outcomes.   Baseline:  Goal status: INITIAL  2.  Pt to demonstrate full ROM of spine in all directions without increase in pain for improved tolerance to walking.  Baseline:  Goal status: INITIAL  3.  Pt to report ability to tolerate prolonged standing for 1 hour to tolerate grocery shopping without increase in pain.  Baseline:  Goal status: INITIAL  4.  Pt to  demonstrate ability to complete x10 20# squats for improved hip strength and stability for walking at least 2 hours without increase in pain.  Baseline:  Goal status: INITIAL  5.  Pt to demonstrate improved core strength and pelvic stability to decreased strain at low back by demonstrating ability to standing without sway in single leg stance for at  least 15s each.  Baseline:  Goal status: INITIAL  6.  Pt to demonstrate at least 5/5 bil hip strength for improved pelvic stability and functional squats without pain for tolerance to carrying in groceries.  Baseline:  Goal status: INITIAL    PLAN:  PT FREQUENCY: 2x/week  PT DURATION: 8 weeks  PLANNED INTERVENTIONS: 97110-Therapeutic exercises, 97530- Therapeutic activity, 97112- Neuromuscular re-education, 97535- Self Care, 16109- Manual therapy, [phone]- Aquatic Therapy, [phone]- Electrical stimulation (unattended), [phone]- Electrical stimulation (manual), [id number]- Vasopneumatic device, [id number]- Ultrasound, [id number]- Traction (mechanical), [id number]- Ionotophoresis 4mg /ml Dexamethasone, 29562 (1-2 muscles), 20561 (3+ muscles)- Dry Needling, Patient/Family education, Balance training, Taping, Joint mobilization, Joint manipulation, Spinal manipulation, Spinal mobilization, Scar mobilization, Vestibular training, DME instructions, Cryotherapy, Moist heat, and Biofeedback.  PLAN FOR NEXT SESSION: aquatic PT for hip and core strengthening and spinal mobility; land based - for beginner core and hip strengthening   Avie Lemme, PT, DPT 06/16/253:06 PM

## 2024-04-04 NOTE — Therapy (Incomplete)
 OUTPATIENT PHYSICAL THERAPY THORACOLUMBAR EVALUATION   Patient Name: Mary Wyatt MRN: [id number] DOB:[birth year], 38 y.o., female Today's Date: 04/04/2024  END OF SESSION:   Past Medical History:  Diagnosis Date   Abdominal pain    Abnormal EKG    Dec 2019 and Jan 2020/ saw cardiologist   Allergy    Altered bowel function    Anemia    Anxiety    Biliary dyskinesia    Celiac disease    Chest tightness    Depression    Dermatitis    Deviated septum    DOE (dyspnea on exertion)    Dysmenorrhea    Endometriosis    Episodic lightheadedness    ETD (Eustachian tube dysfunction), bilateral    Family history of brain aneurysm    Genital warts    GERD (gastroesophageal reflux disease)    Hashimoto's disease    History of meningioma    Hypotension    Insomnia    Interstitial cystitis    Meningioma (HCC)    Mononucleosis    Pelvic pain    Perineal pain    Post-operative nausea and vomiting    Rhinitis    RMSF [facility] spotted fever) 2015   S/P resection of meningioma    Seasonal allergic rhinitis    Tendinitis    Ulcer    Unspecified dyspareunia    Urethral syndrome    Vitamin D  deficiency    Past Surgical History:  Procedure Laterality Date   COLONOSCOPY  2013   CRANIOTOMY  09/2018   laproscopic surgery     2013 and 2019   OVARIAN CYST REMOVAL     TONSILLECTOMY     UPPER GASTROINTESTINAL ENDOSCOPY  2013   Patient Active Problem List   Diagnosis Date Noted   Hypotension - chronic 09/19/2021   Anxiety and depression 06/15/2017   Family history of brain aneurysm 04/19/2017   Rhinitis, allergic 04/20/2014   Generalized anxiety disorder 04/20/2014   Celiac disease 12/15/2012   Interstitial cystitis     PCP: Audria Leather, MD   REFERRING PROVIDER: Audria Leather, MD   REFERRING DIAG:  M54.50 (ICD-10-CM) - Low back pain, unspecified  G89.29 (ICD-10-CM) - Other chronic pain    Rationale for Evaluation and Treatment:  Rehabilitation  THERAPY DIAG:  No diagnosis found.  ONSET DATE: ***  SUBJECTIVE:                                                                                                                                                                                           SUBJECTIVE STATEMENT: ***  PERTINENT HISTORY:  celiac disease, depression,  endometriosis, CRANIOTOMY 2019  PAIN: 04/04/24 Are you having pain? Yes: NPRS scale: *** Pain location: *** Pain description: *** Aggravating factors: *** Relieving factors: ***  PRECAUTIONS: None  RED FLAGS: None   WEIGHT BEARING RESTRICTIONS: No  FALLS:  Has patient fallen in last 6 months? No  LIVING ENVIRONMENT: Lives with: {[facility] lives with:25569::lives with their family} Lives in: {Lives in:25570} Stairs: {opstairs:27293} Has following equipment at home: {Assistive devices:23999}  OCCUPATION: Not working   PLOF: Independent and Leisure: ***  PATIENT GOALS: ***  NEXT MD VISIT: ***  OBJECTIVE:  Note: Objective measures were completed at Evaluation unless otherwise noted.  DIAGNOSTIC FINDINGS:  ***  PATIENT SURVEYS:  04/04/24: Modified Oswestry ***   COGNITION: Overall cognitive status: Within functional limits for tasks assessed     SENSATION: WFL  MUSCLE LENGTH: Hamstrings: Right *** deg; Left *** deg Andy Bannister test: Right *** deg; Left *** deg  POSTURE: {posture:25561}  PALPATION: ***  LUMBAR ROM:   AROM eval  Flexion   Extension   Right lateral flexion   Left lateral flexion   Right rotation   Left rotation    (Blank rows = not tested)  LOWER EXTREMITY ROM:     {AROM/PROM:27142}  Right eval Left eval  Hip flexion    Hip extension    Hip abduction    Hip adduction    Hip internal rotation    Hip external rotation    Knee flexion    Knee extension    Ankle dorsiflexion    Ankle plantarflexion    Ankle inversion    Ankle eversion     (Blank rows = not tested)  LOWER EXTREMITY MMT:     MMT Right eval Left eval  Hip flexion    Hip extension    Hip abduction    Hip adduction    Hip internal rotation    Hip external rotation    Knee flexion    Knee extension    Ankle dorsiflexion    Ankle plantarflexion    Ankle inversion    Ankle eversion     (Blank rows = not tested)  LUMBAR SPECIAL TESTS:  {lumbar special test:25242}  FUNCTIONAL TESTS:  {Functional tests:24029}  GAIT: Distance walked: *** Assistive device utilized: {Assistive devices:23999} Level of assistance: {Levels of assistance:24026} Comments: ***  TREATMENT DATE:  04/04/24 Findings from evaluation discussed, pt educated on plan of care, HEP initiated.   If treatment provided at initial evaluation, no treatment charged due to lack of authorization.                                                                                                                                 PATIENT EDUCATION:  Education details: *** Person educated: Patient Education method: Programmer, multimedia, Facilities manager, and Handouts Education comprehension: verbalized understanding and returned demonstration  HOME EXERCISE PROGRAM: ***  ASSESSMENT:  CLINICAL IMPRESSION: Patient is a 38 y.o. female who was seen today  for physical therapy evaluation and treatment for LBP.   OBJECTIVE IMPAIRMENTS: {opptimpairments:25111}.   ACTIVITY LIMITATIONS: {activitylimitations:27494}  PARTICIPATION LIMITATIONS: {participationrestrictions:25113}  PERSONAL FACTORS: {Personal factors:25162} are also affecting patient's functional outcome.   REHAB POTENTIAL: Good  CLINICAL DECISION MAKING: Stable/uncomplicated  EVALUATION COMPLEXITY: Low   GOALS: Goals reviewed with patient? Yes  SHORT TERM GOALS: Target date: 05/02/2024    Be independent in initial HEP Baseline: Goal status: INITIAL  2.  *** Baseline:  Goal status: INITIAL  3.  *** Baseline:  Goal status: INITIAL  4.  *** Baseline:  Goal status:  INITIAL  5.  *** Baseline:  Goal status: INITIAL  6.  *** Baseline:  Goal status: INITIAL  LONG TERM GOALS: Target date: 05/30/2024    Be independent in advanced HEP Baseline:  Goal status: INITIAL  2.  *** Baseline:  Goal status: INITIAL  3.  *** Baseline:  Goal status: INITIAL  4.  *** Baseline:  Goal status: INITIAL  5.  *** Baseline:  Goal status: INITIAL  6.  *** Baseline:  Goal status: INITIAL  PLAN:  PT FREQUENCY: 2x/week  PT DURATION: 8 weeks  PLANNED INTERVENTIONS: 97110-Therapeutic exercises, 97530- Therapeutic activity, 97112- Neuromuscular re-education, 97535- Self Care, 16109- Manual therapy, [id number]- Canalith repositioning, [id number]- Aquatic Therapy, [id number]- Traction (mechanical), 20560 (1-2 muscles), 20561 (3+ muscles)- Dry Needling, Patient/Family education, Stair training, Taping, Joint mobilization, Joint manipulation, Spinal manipulation, Spinal mobilization, Vestibular training, Cryotherapy, and Moist heat.  PLAN FOR NEXT SESSION: ***   Luella Sager, PT 04/04/24 7:20 AM   [facility] [address] [place], [place] [address] Phone # [phone] Fax [phone]

## 2024-04-04 NOTE — Patient Instructions (Signed)
   [facility] Welcome to [facility]! Here you will find all the information you will need regarding your pool therapy. If you have further questions at any time, please call our office at [phone]. After completing your initial evaluation in the [facility], you may be eligible to complete a portion of your therapy in the pool. A typical week of therapy will consist of 1-2 typical physical therapy visits at our [facility] location and an additional session of therapy in the pool located at the [facility] at [facility]. [address] [place] [address]. The phone number at the pool site is [phone]. Please call this number if you are running late or need to cancel your appointment.  Aquatic therapy will be offered on Wednesday mornings and Friday afternoons. Each session will last approximately 45 minutes. All scheduling and payments for aquatic therapy sessions, including cancelations, will be done through our [facility] location.  To be eligible for aquatic therapy, these criteria must be met: You must be able to independently change in the locker room and get to the pool deck. A caregiver can come with you to help if needed. There are benches for a caregiver to sit on next to the pool. No one with an open wound is permitted in the pool.  Handicap parking is available in the front and there is a drop off option for even closer accessibility. Please arrive 15 minutes prior to your appointment to prepare for your pool session. You must sign in at the front desk upon your arrival. Please be sure to attend to any toileting needs prior to entering the pool. Locker rooms for changing are available.  There is direct access to the pool deck from the locker room. You can lock your belongings in a locker or bring them with you poolside. Your therapist will greet you on the pool deck. There may be other swimmers in the pool at the  same time but your session is one-on-one with the therapist.

## 2024-04-06 ENCOUNTER — Ambulatory Visit (INDEPENDENT_AMBULATORY_CARE_PROVIDER_SITE_OTHER): Payer: Self-pay | Admitting: Clinical

## 2024-04-06 DIAGNOSIS — F4322 Adjustment disorder with anxiety: Secondary | ICD-10-CM

## 2024-04-06 NOTE — Progress Notes (Signed)
 Time: 9:00am-9:57am CPT Code: 21308M Diagnosis: Adjustment disorder with anxiety  Mary Wyatt was seen in person for individual therapy. Following verbal review of consent forms, therapist engaged Mary Wyatt in completion of an intake assessment and creation of a plan for treatment. Mary Wyatt provided input into her treatment plan and verbally consented to all goals and interventions. She is scheduled to be seen again in two weeks.  Intake Presenting Problem Mary Wyatt transitioned from therapy with the present therapist to pursue ERP through the platform [email or web address]. She continued OCD-targeted therapy for several years with a different provider. Since that time, she has moved back in with her parents, and has learned more about how her OCD developed. She was also in a two-year relationship during this time. She became concerned that therapy may have become a compulsion that prevented her from engaging with challenges in her life. She ended therapy and ended the relationship shortly thereafter. She lived with her boyfriend's parents from August 2024 for 6 months. She reported that she has lived with her parents for 1 of the past 10 months, after which time she moved back in with her boyfriend. The relationship ended at the end of April, 2025. She had moved out of their condo as of June 2025. Symptoms Anger, anxiety History of Problem  Mary Wyatt reported that living with her ex-boyfriend was especially challenging due to his tendency toward messiness. She has also found the move back in with her parents challenging. Her current struggle relates to having noticed that her parents do not wash their hands after going to the bathroom. Mary Wyatt has been unemployed for a year.  Recent Trigger  Mary Wyatt returned for CBT following a series of major life events. She has pursued ERP for OCD since 2022.  Mary and Family Information  Wyatt has two siblings, one of whom is in [place] and the other is in [place]. She and her siblings have  experienced challenges with their parents related to different world views. She has struggled to set boundaries with her parents.   Present family concerns/problems: Mary Wyatt is in contact with her family and exploring how she might set boundaries with her parents.   Strengths/resources in the family/friends: Mary Wyatt reconnected in recent years with a friend who has a similar background to hers. They get together regularly.    Interpersonal concerns/problems: Mary Wyatt was in her most recent job for over 1.5 years. She described the work environment as highly toxic. She took FMLA for her mental health due to work-related stress. She ultimately left in August 2024, following a conflict with a coworker she had believed was a friend.  General Behavior: WNL Attire: WNL Gait: WNL Motor Activity: WNL Stream of Thought - Productivity: WNL Stream of thought - Progression: WNL Stream of thought - Language:  WNL Emotional tone and reactions - Mood: WNL  Emotional tone and reactions - Affect: WNL Mental trend/Content of thoughts - Perception: WNL  Mental trend/Content of thoughts - Orientation: WNL Mental trend/Content of thoughts - Memory: WNL Mental trend/Content of thoughts - General knowledge: WNL  Insight: WNL Judgment: WNL Intelligence: WNL Mental Status Comment: Client appears oriented to time and space. Overall functioning WNL  Adjustment disorder with anxiety   Treatment Plan Client Abilities/Strengths  Mary Wyatt is reflective, uninhibited  Client Treatment Preferences Mary Wyatt prefers in person sessions. She prefers sessions that fit with her work schedule and are not too early in the morning.  Client Statement of Needs  Mary Wyatt shared that she is seeking an outside, unattached perspective on her  life situation and experiences.  Treatment Level  She would like biweekly sessions.  Symptoms  Anxiety, anger, sleep challenges (she described herself as a light sleeper whose sleep is easily  disrupted) Problems Addressed  Mary Wyatt shared that she has a tendency toward negativity that she would like to address.  Goals 1. Mary Wyatt would like to process her breakup with her ex-boyfriend.  Objective Mary Wyatt would like to better understand the role she wants romantic relationship to play in her life, if any  Target Date: 04/06/2025 Frequency: biweekly  Progress: 0 Modality: Individual therapy  Related Interventions Mary Wyatt will have opportunities to process her experiences in session Therapist will help Mary Wyatt notice and disengage from maladaptive thoughts and behaviors using CBT based strategies Therapist will work with Mary Wyatt Mary Wyatt to establish and set healthy boundaries in relationship Therapist will provide referrals for additional resources as appropriate   Adjustment disorder with Anxiety               Mary Lacy, PhD

## 2024-04-11 ENCOUNTER — Ambulatory Visit (HOSPITAL_BASED_OUTPATIENT_CLINIC_OR_DEPARTMENT_OTHER): Payer: Self-pay | Attending: Family Medicine | Admitting: Physical Therapy

## 2024-04-11 ENCOUNTER — Encounter (HOSPITAL_BASED_OUTPATIENT_CLINIC_OR_DEPARTMENT_OTHER): Payer: Self-pay | Admitting: Physical Therapy

## 2024-04-11 DIAGNOSIS — M6281 Muscle weakness (generalized): Secondary | ICD-10-CM | POA: Insufficient documentation

## 2024-04-11 DIAGNOSIS — R293 Abnormal posture: Secondary | ICD-10-CM | POA: Diagnosis present

## 2024-04-11 DIAGNOSIS — M5459 Other low back pain: Secondary | ICD-10-CM | POA: Insufficient documentation

## 2024-04-11 NOTE — Therapy (Signed)
 OUTPATIENT PHYSICAL THERAPY THORACOLUMBAR EVALUATION   Patient Name: Mary Wyatt MRN: [id number] DOB:1986/09/14, 38 y.o., female Today's Date: 04/11/2024  END OF SESSION:  PT End of Session - 04/11/24 1401     Visit Number 2    Date for PT Re-Evaluation 05/30/24    Authorization Type [employer]    PT Start Time 1402    PT Stop Time 1443    PT Time Calculation (min) 41 min    Activity Tolerance Patient tolerated treatment well    Behavior During Therapy [facility] for tasks assessed/performed          Past Medical History:  Diagnosis Date   Abdominal pain    Abnormal EKG    Dec 2019 and Jan 2020/ saw cardiologist   Allergy    Altered bowel function    Anemia    Anxiety    Biliary dyskinesia    Celiac disease    Chest tightness    Depression    Dermatitis    Deviated septum    DOE (dyspnea on exertion)    Dysmenorrhea    Endometriosis    Episodic lightheadedness    ETD (Eustachian tube dysfunction), bilateral    Family history of brain aneurysm    Genital warts    GERD (gastroesophageal reflux disease)    Hashimoto's disease    History of meningioma    Hypotension    Insomnia    Interstitial cystitis    Meningioma (HCC)    Mononucleosis    Pelvic pain    Perineal pain    Post-operative nausea and vomiting    Rhinitis    [facility] spotted fever) 2015   S/P resection of meningioma    Seasonal allergic rhinitis    Tendinitis    Ulcer    Unspecified dyspareunia    Urethral syndrome    Vitamin D  deficiency    Past Surgical History:  Procedure Laterality Date   COLONOSCOPY  2013   CRANIOTOMY  09/2018   laproscopic surgery     2013 and 2019   OVARIAN CYST REMOVAL     TONSILLECTOMY     UPPER GASTROINTESTINAL ENDOSCOPY  2013   Patient Active Problem List   Diagnosis Date Noted   Hypotension - chronic 09/19/2021   Anxiety and depression 06/15/2017   Family history of brain aneurysm 04/19/2017   Rhinitis, allergic 04/20/2014    Generalized anxiety disorder 04/20/2014   Celiac disease 12/15/2012   Interstitial cystitis     PCP: Leila Lucie LABOR, MD  REFERRING PROVIDER: Constantino Bong, MD   REFERRING DIAG: M54.50 (ICD-10-CM) - Low back pain, unspecified  Rationale for Evaluation and Treatment: Rehabilitation  THERAPY DIAG:  Other low back pain  Muscle weakness (generalized)  Abnormal posture  ONSET DATE: years  SUBJECTIVE:  SUBJECTIVE STATEMENT: Pain is a 5/10.  No changes since eval.  Back is stiff  Initial Subjective Back pain for several years now, feels limited in her ability to return to working out, standing for more than 5 mins makes it worse. Tries to walk daily but pain gets worse with more time and/or distance.   PERTINENT HISTORY:  recurrent BV and yeast,anxiety, celiac disease, depression, endometriosis, genital warts, IC, CRANIOTOMY 2019,   PAIN:  Are you having pain? Yes: NPRS scale: 5/10 but worsens with activity Pain location: mid to low back bil Pain description: spasm in mid back (with standing/activity), low back is constant dull achy Aggravating factors: walking with mild increase; static standing starts to spasm after 5 mins, stairs Relieving factors: mid back with rest; low back is constant  PRECAUTIONS: None  RED FLAGS: None   WEIGHT BEARING RESTRICTIONS: No  FALLS:  Has patient fallen in last 6 months? No  LIVING ENVIRONMENT: Lives with: lives with their family Lives in: House/apartment Stairs: No Has following equipment at home: None  OCCUPATION: not currently  PLOF: Independent  PATIENT GOALS: to have less pain, wants to be able to stand for 2 hours for better tolerance for working and social activities  NEXT MD VISIT: 04/12/24 MRI scheduled for spine  OBJECTIVE:  Note:  Objective measures were completed at Evaluation unless otherwise noted.  DIAGNOSTIC FINDINGS:  Impression: MRI spine lumbar without contrast 09/2024 Small right extra foraminal disc protrusion at L4-5, closely approximating and potentially irritation the exiting right L4 nerve root Central disc protrusion at L5-S1, closely approximating the descending S1 nerve roots without overt neural impingement   Impression:normal findings at thoracic spine with MRI 09/2023  Impression: MRI CERVICAL WITHOUT CONTRAST 09/2023 Small central disc protrusion with uncovertebral spurring at C5-6 with resultant borderline mild spinal stenosis with mild right C6 foraminal narrowing Minimal disc bulging at C4-5 and C6-7 without stenosis or impingement    PATIENT SURVEYS:   Oswestry Score: 21 / 50 or 42 %  COGNITION: Overall cognitive status: Within functional limits for tasks assessed     SENSATION: Reports some pins and needles constant in bil feet (feels like electric current); and reports sometimes has tingling in bil hands but not consistent and not worse with pain   MUSCLE LENGTH: Hamstrings: bil limited by 25% Adductors limited by 25% bil and trigger points present bil as well   POSTURE: rounded shoulders, increased thoracic kyphosis, and posterior pelvic tilt  PALPATION: TTP throughout midline and bil paraspinals in lumbar spine but worse at Lt side; Lt SIJ most tender area, bil piriformis also TTP  LUMBAR ROM:   AROM eval  Flexion Pain with immediate flexion mobility but able to complete and limited by 25%  Extension Limited by 25%  Right lateral flexion Fingertips to 2 in above knee with pain  Left lateral flexion Fingertips to 2 in above knee with pain  Right rotation WFL  Left rotation WFL   (Blank rows = not tested)  LOWER EXTREMITY ROM:     WFL but pain with bil hip flexion, bil hip ER  LOWER EXTREMITY MMT:    Bil hips grossly 4+/5 except for flexion and abduction  4/5  LUMBAR SPECIAL TESTS:  Straight leg raise test: pain bil in low back, Single leg stance test: no pain either side, SI Compression/distraction test: compression improved pain, and FABER test: Negative  FUNCTIONAL TESTS:  Functional squat - mild bil knee valgus and reports back pain at full end range  of squat   Sit up test - 2/3 GAIT: Distance walked: 250' Assistive device utilized: None Level of assistance: Complete Independence Comments: decreased bil step height, decreased   TREATMENT DATE:  [facility] Adult PT Treatment:                                                DATE: 04/11/24 Pt seen for aquatic therapy today.  Treatment took place in water 3.5-4.75 ft in depth at the [facility] pool. Temp of water was 91.  Pt entered/exited the pool via stairs using alternating pattern with hand rail.  *Intro to setting *walking forward, back and side stepping in 3.6 ft with unsupported *L stretch; not tolerated (caused lb to ping) *seated on solid noodle swing like: PPT x 3; hip hiking  *TrA engagement: instruction abd bracing; Farmers carry using RBHB bilaterally then unilaterally *noodle wrapped posteriorly across chest: cycling *ue support on corner wall: hip add/abd; hip flex/ext *hamstring stretch at step *gastroc stretch at steps *Hollow noodle pull down for Tra engagement wide stance.  Staggered stances with 1/2 noodle x 5 reps eachVC for abd bracing *walking between exercises for recovery.   Pt requires the buoyancy and hydrostatic pressure of water for support, and to offload joints by unweighting joint load by at least 50 % in navel deep water and by at least 75-80% in chest to neck deep water.  Viscosity of the water is needed for resistance of strengthening. Water current perturbations provides challenge to standing balance requiring increased core activation.        PATIENT EDUCATION:  Education details: [id number]  Person educated: Patient Education method:  Explanation, Demonstration, Tactile cues, Verbal cues, and Handouts Education comprehension: verbalized understanding, returned demonstration, verbal cues required, tactile cues required, and needs further education  HOME EXERCISE PROGRAM: [id number]   ASSESSMENT:  CLINICAL IMPRESSION: Pt demonstrates safety and independence in aquatic setting with therapist instructing from deck. She is confident in setting, moving throughout all depths easily.  Pt is directed through various movement patterns and trials in both sitting and standing positions. She reports LB feeling tight throughout. Tolerates session well although reports no pain reduction.  LB does lossen up a little bit. Goals are ongoing.     Initial Impression Patient is a 38 y.o. female  who was seen today for physical therapy evaluation and treatment for back pain. Pt has had recent pelvic floor PT and did have improvements with this but back pain persists.  Pt demonstrates decreased core strength evident by 2/3 scoring of sit up test, decreased hip strength with decreased MMTs, vaglus knees with squatting, and poor single leg stance balance. ODI scoring reporting 42% rating, indicating 42% disability in function due to pain perceived by pt. Pt would benefit from the buoyancy of aquatic PT for improved tolerance to land based exercises without worsening pain. Pt would benefit from additional PT to further address deficits.    OBJECTIVE IMPAIRMENTS: decreased activity tolerance, decreased coordination, decreased endurance, decreased mobility, difficulty walking, decreased ROM, decreased strength, increased fascial restrictions, impaired perceived functional ability, increased muscle spasms, impaired flexibility, impaired sensation, improper body mechanics, postural dysfunction, and pain.   ACTIVITY LIMITATIONS: carrying, lifting, bending, sitting, standing, squatting, stairs, and locomotion level  PARTICIPATION LIMITATIONS: meal prep,  cleaning, laundry, driving, shopping, community activity, and yard work  PERSONAL FACTORS: Fitness, Past/current experiences, and  Time since onset of injury/illness/exacerbation are also affecting patient's functional outcome.   REHAB POTENTIAL: Good  CLINICAL DECISION MAKING: Stable/uncomplicated  EVALUATION COMPLEXITY: Low   GOALS: Goals reviewed with patient? Yes  SHORT TERM GOALS: Target date: 05/02/24  Pt to be I with HEP for carry over and continuing recommendations for improved outcomes.   Baseline: Goal status: INITIAL  2.  Pt to report ability to tolerate standing at least 30 mins without worsening of pain for ability to complete vacuuming at home.  Baseline: 5 mins with worsening pain Goal status: INITIAL  3.  Pt to demonstrate ability to complete x10 body weight squats without worsening pain for improved ability to tolerate hip and core strengthening for returning to work outs.  Baseline:  Goal status: INITIAL     LONG TERM GOALS: Target date: 05/30/24  Pt to be I with advanced HEP for carry over and continuing recommendations for improved outcomes.   Baseline:  Goal status: INITIAL  2.  Pt to demonstrate full ROM of spine in all directions without increase in pain for improved tolerance to walking.  Baseline:  Goal status: INITIAL  3.  Pt to report ability to tolerate prolonged standing for 1 hour to tolerate grocery shopping without increase in pain.  Baseline:  Goal status: INITIAL  4.  Pt to demonstrate ability to complete x10 20# squats for improved hip strength and stability for walking at least 2 hours without increase in pain.  Baseline:  Goal status: INITIAL  5.  Pt to demonstrate improved core strength and pelvic stability to decreased strain at low back by demonstrating ability to standing without sway in single leg stance for at least 15s each.  Baseline:  Goal status: INITIAL  6.  Pt to demonstrate at least 5/5 bil hip strength for improved  pelvic stability and functional squats without pain for tolerance to carrying in groceries.  Baseline:  Goal status: INITIAL    PLAN:  PT FREQUENCY: 2x/week  PT DURATION: 8 weeks  PLANNED INTERVENTIONS: 97110-Therapeutic exercises, 97530- Therapeutic activity, 97112- Neuromuscular re-education, 97535- Self Care, [id number]- Manual therapy, [phone]- Aquatic Therapy, [phone]- Electrical stimulation (unattended), [phone]- Electrical stimulation (manual), [id number]- Vasopneumatic device, [id number]- Ultrasound, [id number]- Traction (mechanical), [id number]- Ionotophoresis 4mg /ml Dexamethasone, [id number] (1-2 muscles), [id number] (3+ muscles)- Dry Needling, Patient/Family education, Balance training, Taping, Joint mobilization, Joint manipulation, Spinal manipulation, Spinal mobilization, Scar mobilization, Vestibular training, DME instructions, Cryotherapy, Moist heat, and Biofeedback.  PLAN FOR NEXT SESSION: aquatic PT for hip and core strengthening and spinal mobility; land based - for beginner core and hip strengthening   Ronal Kem) Yenni Carra MPT 04/11/24 2:06 PM [facility] [address] [place], [place], [address] Phone: [phone]   Fax:  [phone]

## 2024-04-14 ENCOUNTER — Ambulatory Visit

## 2024-04-14 ENCOUNTER — Ambulatory Visit (HOSPITAL_BASED_OUTPATIENT_CLINIC_OR_DEPARTMENT_OTHER): Admitting: Physical Therapy

## 2024-04-14 DIAGNOSIS — R293 Abnormal posture: Secondary | ICD-10-CM

## 2024-04-14 DIAGNOSIS — R262 Difficulty in walking, not elsewhere classified: Secondary | ICD-10-CM

## 2024-04-14 DIAGNOSIS — M5459 Other low back pain: Secondary | ICD-10-CM

## 2024-04-14 DIAGNOSIS — M6281 Muscle weakness (generalized): Secondary | ICD-10-CM

## 2024-04-14 DIAGNOSIS — R252 Cramp and spasm: Secondary | ICD-10-CM

## 2024-04-14 NOTE — Therapy (Signed)
 OUTPATIENT PHYSICAL THERAPY THORACOLUMBAR EVALUATION   Patient Name: Mary Wyatt MRN: [id number] DOB:October 02, 1986, 38 y.o., female Today's Date: 04/14/2024  END OF SESSION:  PT End of Session - 04/14/24 1238     Visit Number 3    Number of Visits 6    Date for PT Re-Evaluation 05/30/24    Authorization Type [employer]    Authorization Time Period 6 visits 04/04/24 to 8/14    Authorization - Visit Number 3    Authorization - Number of Visits 6    Progress Note Due on Visit 5    PT Start Time 1230    PT Stop Time 1312    PT Time Calculation (min) 42 min    Activity Tolerance Patient tolerated treatment well    Behavior During Therapy [facility] for tasks assessed/performed          Past Medical History:  Diagnosis Date   Abdominal pain    Abnormal EKG    Dec 2019 and Jan 2020/ saw cardiologist   Allergy    Altered bowel function    Anemia    Anxiety    Biliary dyskinesia    Celiac disease    Chest tightness    Depression    Dermatitis    Deviated septum    DOE (dyspnea on exertion)    Dysmenorrhea    Endometriosis    Episodic lightheadedness    ETD (Eustachian tube dysfunction), bilateral    Family history of brain aneurysm    Genital warts    GERD (gastroesophageal reflux disease)    Hashimoto's disease    History of meningioma    Hypotension    Insomnia    Interstitial cystitis    Meningioma (HCC)    Mononucleosis    Pelvic pain    Perineal pain    Post-operative nausea and vomiting    Rhinitis    [facility] spotted fever) 2015   S/P resection of meningioma    Seasonal allergic rhinitis    Tendinitis    Ulcer    Unspecified dyspareunia    Urethral syndrome    Vitamin D  deficiency    Past Surgical History:  Procedure Laterality Date   COLONOSCOPY  2013   CRANIOTOMY  09/2018   laproscopic surgery     2013 and 2019   OVARIAN CYST REMOVAL     TONSILLECTOMY     UPPER GASTROINTESTINAL ENDOSCOPY  2013   Patient Active Problem List    Diagnosis Date Noted   Hypotension - chronic 09/19/2021   Anxiety and depression 06/15/2017   Family history of brain aneurysm 04/19/2017   Rhinitis, allergic 04/20/2014   Generalized anxiety disorder 04/20/2014   Celiac disease 12/15/2012   Interstitial cystitis     PCP: Leila Lucie LABOR, MD  REFERRING PROVIDER: Constantino Bong, MD   REFERRING DIAG: M54.50 (ICD-10-CM) - Low back pain, unspecified  Rationale for Evaluation and Treatment: Rehabilitation  THERAPY DIAG:  Other low back pain  Muscle weakness (generalized)  Cramp and spasm  Difficulty in walking, not elsewhere classified  Abnormal posture  ONSET DATE: years  SUBJECTIVE:  SUBJECTIVE STATEMENT: Patient reports she is still pretty sore in the mid low back.  Pain level 5/10.    PERTINENT HISTORY:  recurrent BV and yeast,anxiety, celiac disease, depression, endometriosis, genital warts, IC, CRANIOTOMY 2019,   PAIN:  04/14/24 Are you having pain? Yes: NPRS scale: 5/10 but worsens with activity Pain location: mid to low back bil Pain description: spasm in mid back (with standing/activity), low back is constant dull achy Aggravating factors: walking with mild increase; static standing starts to spasm after 5 mins, stairs Relieving factors: mid back with rest; low back is constant  PRECAUTIONS: None  RED FLAGS: None   WEIGHT BEARING RESTRICTIONS: No  FALLS:  Has patient fallen in last 6 months? No  LIVING ENVIRONMENT: Lives with: lives with their family Lives in: House/apartment Stairs: No Has following equipment at home: None  OCCUPATION: not currently  PLOF: Independent  PATIENT GOALS: to have less pain, wants to be able to stand for 2 hours for better tolerance for working and social activities  NEXT MD VISIT:  04/12/24 MRI scheduled for spine  OBJECTIVE:  Note: Objective measures were completed at Evaluation unless otherwise noted.  DIAGNOSTIC FINDINGS:  Impression: MRI spine lumbar without contrast 09/2024 Small right extra foraminal disc protrusion at L4-5, closely approximating and potentially irritation the exiting right L4 nerve root Central disc protrusion at L5-S1, closely approximating the descending S1 nerve roots without overt neural impingement   Impression:normal findings at thoracic spine with MRI 09/2024  Impression: MRI CERVICAL WITHOUT CONTRAST 09/2024 Small central disc protrusion with uncovertebral spurring at C5-6 with resultant borderline mild spinal stenosis with mild right C6 foraminal narrowing Minimal disc bulging at C4-5 and C6-7 without stenosis or impingement    PATIENT SURVEYS:   Oswestry Score: 21 / 50 or 42 %  COGNITION: Overall cognitive status: Within functional limits for tasks assessed     SENSATION: Reports some pins and needles constant in bil feet (feels like electric current); and reports sometimes has tingling in bil hands but not consistent and not worse with pain   MUSCLE LENGTH: Hamstrings: bil limited by 25% Adductors limited by 25% bil and trigger points present bil as well   POSTURE: rounded shoulders, increased thoracic kyphosis, and posterior pelvic tilt  PALPATION: TTP throughout midline and bil paraspinals in lumbar spine but worse at Lt side; Lt SIJ most tender area, bil piriformis also TTP  LUMBAR ROM:   AROM eval  Flexion Pain with immediate flexion mobility but able to complete and limited by 25%  Extension Limited by 25%  Right lateral flexion Fingertips to 2 in above knee with pain  Left lateral flexion Fingertips to 2 in above knee with pain  Right rotation WFL  Left rotation WFL   (Blank rows = not tested)  LOWER EXTREMITY ROM:     WFL but pain with bil hip flexion, bil hip ER  LOWER EXTREMITY MMT:    Bil hips  grossly 4+/5 except for flexion and abduction 4/5  LUMBAR SPECIAL TESTS:  Straight leg raise test: pain bil in low back, Single leg stance test: no pain either side, SI Compression/distraction test: compression improved pain, and FABER test: Negative  FUNCTIONAL TESTS:  Functional squat - mild bil knee valgus and reports back pain at full end range of squat   Sit up test - 2/3 GAIT: Distance walked: 250' Assistive device utilized: None Level of assistance: Complete Independence Comments: decreased bil step height, decreased   TREATMENT DATE:  04/14/24 Nustep x  5 min level 3 (PT present to discuss status) Standing hamstring stretch 3 x 30 sec each LE Standing quad/hip flexor stretch 3 x 30 sec each LE Seated piriformis stretch 3 x 30 sec each LE Hook lying PPT x 20 PPT with 90/90 heel tap x 20 PPT with dying bug x 20 unsupported  PPT with SLR and shoulder extension using 5lb dumbbell Ice to lumbar spine x 10 min at end of session in hook lying with legs propped on Bolster  Examination completed, findings reviewed, pt educated on POC, HEP. Pt motivated to participate in PT and agreeable to attempt recommendations.   For all possible CPT codes, reference the Planned Interventions line above.     If treatment provided at initial evaluation, no treatment charged due to lack of authorization.       PATIENT EDUCATION:  Education details: [id number]  Person educated: Patient Education method: Explanation, Demonstration, Tactile cues, Verbal cues, and Handouts Education comprehension: verbalized understanding, returned demonstration, verbal cues required, tactile cues required, and needs further education  HOME EXERCISE PROGRAM: [id number]   ASSESSMENT:  CLINICAL IMPRESSION: Siyah actually did very well today with core strengthening.  She was able to do unsupported work with no c/o significant pain.  She expressed some definite assymmetries during stretching and felt relief with the  stretching.  She appears to be well motivated and compliant.  She should continue to do well.    Pt would benefit from additional PT to further address deficits.    OBJECTIVE IMPAIRMENTS: decreased activity tolerance, decreased coordination, decreased endurance, decreased mobility, difficulty walking, decreased ROM, decreased strength, increased fascial restrictions, impaired perceived functional ability, increased muscle spasms, impaired flexibility, impaired sensation, improper body mechanics, postural dysfunction, and pain.   ACTIVITY LIMITATIONS: carrying, lifting, bending, sitting, standing, squatting, stairs, and locomotion level  PARTICIPATION LIMITATIONS: meal prep, cleaning, laundry, driving, shopping, community activity, and yard work  PERSONAL FACTORS: Fitness, Past/current experiences, and Time since onset of injury/illness/exacerbation are also affecting patient's functional outcome.   REHAB POTENTIAL: Good  CLINICAL DECISION MAKING: Stable/uncomplicated  EVALUATION COMPLEXITY: Low   GOALS: Goals reviewed with patient? Yes  SHORT TERM GOALS: Target date: 05/02/24  Pt to be I with HEP for carry over and continuing recommendations for improved outcomes.   Baseline: Goal status: INITIAL  2.  Pt to report ability to tolerate standing at least 30 mins without worsening of pain for ability to complete vacuuming at home.  Baseline: 5 mins with worsening pain Goal status: INITIAL  3.  Pt to demonstrate ability to complete x10 body weight squats without worsening pain for improved ability to tolerate hip and core strengthening for returning to work outs.  Baseline:  Goal status: INITIAL     LONG TERM GOALS: Target date: 05/30/24  Pt to be I with advanced HEP for carry over and continuing recommendations for improved outcomes.   Baseline:  Goal status: INITIAL  2.  Pt to demonstrate full ROM of spine in all directions without increase in pain for improved tolerance to  walking.  Baseline:  Goal status: INITIAL  3.  Pt to report ability to tolerate prolonged standing for 1 hour to tolerate grocery shopping without increase in pain.  Baseline:  Goal status: INITIAL  4.  Pt to demonstrate ability to complete x10 20# squats for improved hip strength and stability for walking at least 2 hours without increase in pain.  Baseline:  Goal status: INITIAL  5.  Pt to demonstrate improved core strength and  pelvic stability to decreased strain at low back by demonstrating ability to standing without sway in single leg stance for at least 15s each.  Baseline:  Goal status: INITIAL  6.  Pt to demonstrate at least 5/5 bil hip strength for improved pelvic stability and functional squats without pain for tolerance to carrying in groceries.  Baseline:  Goal status: INITIAL    PLAN:  PT FREQUENCY: 2x/week  PT DURATION: 8 weeks  PLANNED INTERVENTIONS: 97110-Therapeutic exercises, 97530- Therapeutic activity, 97112- Neuromuscular re-education, 97535- Self Care, [id number]- Manual therapy, [phone]- Aquatic Therapy, [phone]- Electrical stimulation (unattended), [phone]- Electrical stimulation (manual), [id number]- Vasopneumatic device, [id number]- Ultrasound, [id number]- Traction (mechanical), [id number]- Ionotophoresis 4mg /ml Dexamethasone, [id number] (1-2 muscles), [id number] (3+ muscles)- Dry Needling, Patient/Family education, Balance training, Taping, Joint mobilization, Joint manipulation, Spinal manipulation, Spinal mobilization, Scar mobilization, Vestibular training, DME instructions, Cryotherapy, Moist heat, and Biofeedback.  PLAN FOR NEXT SESSION: aquatic PT for hip and core strengthening and spinal mobility; land based - for beginner core and hip strengthening  Jesyca Weisenburger B. Jermichael Belmares, PT 04/14/24 5:36 PM [facility] [address] [place], [place] [address] Phone # [phone] Fax [phone]

## 2024-04-18 ENCOUNTER — Ambulatory Visit: Admitting: Radiology

## 2024-04-19 ENCOUNTER — Encounter (HOSPITAL_BASED_OUTPATIENT_CLINIC_OR_DEPARTMENT_OTHER): Payer: Self-pay | Admitting: Physical Therapy

## 2024-04-19 ENCOUNTER — Ambulatory Visit (INDEPENDENT_AMBULATORY_CARE_PROVIDER_SITE_OTHER): Admitting: Clinical

## 2024-04-19 ENCOUNTER — Ambulatory Visit

## 2024-04-19 ENCOUNTER — Ambulatory Visit (HOSPITAL_BASED_OUTPATIENT_CLINIC_OR_DEPARTMENT_OTHER): Attending: Family Medicine | Admitting: Physical Therapy

## 2024-04-19 DIAGNOSIS — M5459 Other low back pain: Secondary | ICD-10-CM | POA: Insufficient documentation

## 2024-04-19 DIAGNOSIS — F4322 Adjustment disorder with anxiety: Secondary | ICD-10-CM | POA: Diagnosis not present

## 2024-04-19 DIAGNOSIS — M6281 Muscle weakness (generalized): Secondary | ICD-10-CM | POA: Insufficient documentation

## 2024-04-19 DIAGNOSIS — R252 Cramp and spasm: Secondary | ICD-10-CM | POA: Diagnosis present

## 2024-04-19 NOTE — Therapy (Signed)
 OUTPATIENT PHYSICAL THERAPY THORACOLUMBAR TREATMENT   Patient Name: Mary Wyatt MRN: [id number] DOB:11/07/1985, 38 y.o., female Today's Date: 04/19/2024  END OF SESSION:  PT End of Session - 04/19/24 0945     Visit Number 4    Number of Visits 6    Date for PT Re-Evaluation 05/30/24    Authorization Type healthy blue    Authorization Time Period 6 visits 04/04/24 to 8/14    Authorization - Number of Visits 6    Progress Note Due on Visit 5    PT Start Time 0935    PT Stop Time 1015    PT Time Calculation (min) 40 min    Activity Tolerance Patient tolerated treatment well    Behavior During Therapy [facility] for tasks assessed/performed           Past Medical History:  Diagnosis Date   Abdominal pain    Abnormal EKG    Dec 2019 and Jan 2020/ saw cardiologist   Allergy    Altered bowel function    Anemia    Anxiety    Biliary dyskinesia    Celiac disease    Chest tightness    Depression    Dermatitis    Deviated septum    DOE (dyspnea on exertion)    Dysmenorrhea    Endometriosis    Episodic lightheadedness    ETD (Eustachian tube dysfunction), bilateral    Family history of brain aneurysm    Genital warts    GERD (gastroesophageal reflux disease)    Hashimoto's disease    History of meningioma    Hypotension    Insomnia    Interstitial cystitis    Meningioma (HCC)    Mononucleosis    Pelvic pain    Perineal pain    Post-operative nausea and vomiting    Rhinitis    [facility] spotted fever) 2015   S/P resection of meningioma    Seasonal allergic rhinitis    Tendinitis    Ulcer    Unspecified dyspareunia    Urethral syndrome    Vitamin D  deficiency    Past Surgical History:  Procedure Laterality Date   COLONOSCOPY  2013   CRANIOTOMY  09/2018   laproscopic surgery     2013 and 2019   OVARIAN CYST REMOVAL     TONSILLECTOMY     UPPER GASTROINTESTINAL ENDOSCOPY  2013   Patient Active Problem List   Diagnosis Date Noted   Hypotension  - chronic 09/19/2021   Anxiety and depression 06/15/2017   Family history of brain aneurysm 04/19/2017   Rhinitis, allergic 04/20/2014   Generalized anxiety disorder 04/20/2014   Celiac disease 12/15/2012   Interstitial cystitis     PCP: Leila Lucie LABOR, MD  REFERRING PROVIDER: Constantino Bong, MD   REFERRING DIAG: M54.50 (ICD-10-CM) - Low back pain, unspecified  Rationale for Evaluation and Treatment: Rehabilitation  THERAPY DIAG:  Other low back pain  Muscle weakness (generalized)  Cramp and spasm  ONSET DATE: years  SUBJECTIVE:  SUBJECTIVE STATEMENT: Patient reports pain level 5/10.  Good response to 1st aquatic session with reports of decreased pain sensitivity by next day which did slowly creep back with time.  PERTINENT HISTORY:  recurrent BV and yeast,anxiety, celiac disease, depression, endometriosis, genital warts, IC, CRANIOTOMY 2019,   PAIN:  04/14/24 Are you having pain? Yes: NPRS scale: 5/10 but worsens with activity Pain location: mid to low back bil Pain description: spasm in mid back (with standing/activity), low back is constant dull achy Aggravating factors: walking with mild increase; static standing starts to spasm after 5 mins, stairs Relieving factors: mid back with rest; low back is constant  PRECAUTIONS: None  RED FLAGS: None   WEIGHT BEARING RESTRICTIONS: No  FALLS:  Has patient fallen in last 6 months? No  LIVING ENVIRONMENT: Lives with: lives with their family Lives in: House/apartment Stairs: No Has following equipment at home: None  OCCUPATION: not currently  PLOF: Independent  PATIENT GOALS: to have less pain, wants to be able to stand for 2 hours for better tolerance for working and social activities  NEXT MD VISIT: 04/12/24 MRI scheduled  for spine  OBJECTIVE:  Note: Objective measures were completed at Evaluation unless otherwise noted.  DIAGNOSTIC FINDINGS:  Impression: MRI spine lumbar without contrast 09/2024 Small right extra foraminal disc protrusion at L4-5, closely approximating and potentially irritation the exiting right L4 nerve root Central disc protrusion at L5-S1, closely approximating the descending S1 nerve roots without overt neural impingement   Impression:normal findings at thoracic spine with MRI 09/2024  Impression: MRI CERVICAL WITHOUT CONTRAST 09/2024 Small central disc protrusion with uncovertebral spurring at C5-6 with resultant borderline mild spinal stenosis with mild right C6 foraminal narrowing Minimal disc bulging at C4-5 and C6-7 without stenosis or impingement    PATIENT SURVEYS:   Oswestry Score: 21 / 50 or 42 %  COGNITION: Overall cognitive status: Within functional limits for tasks assessed     SENSATION: Reports some pins and needles constant in bil feet (feels like electric current); and reports sometimes has tingling in bil hands but not consistent and not worse with pain   MUSCLE LENGTH: Hamstrings: bil limited by 25% Adductors limited by 25% bil and trigger points present bil as well   POSTURE: rounded shoulders, increased thoracic kyphosis, and posterior pelvic tilt  PALPATION: TTP throughout midline and bil paraspinals in lumbar spine but worse at Lt side; Lt SIJ most tender area, bil piriformis also TTP  LUMBAR ROM:   AROM eval  Flexion Pain with immediate flexion mobility but able to complete and limited by 25%  Extension Limited by 25%  Right lateral flexion Fingertips to 2 in above knee with pain  Left lateral flexion Fingertips to 2 in above knee with pain  Right rotation WFL  Left rotation WFL   (Blank rows = not tested)  LOWER EXTREMITY ROM:     WFL but pain with bil hip flexion, bil hip ER  LOWER EXTREMITY MMT:    Bil hips grossly 4+/5 except for  flexion and abduction 4/5  LUMBAR SPECIAL TESTS:  Straight leg raise test: pain bil in low back, Single leg stance test: no pain either side, SI Compression/distraction test: compression improved pain, and FABER test: Negative  FUNCTIONAL TESTS:  Functional squat - mild bil knee valgus and reports back pain at full end range of squat   Sit up test - 2/3 GAIT: Distance walked: 250' Assistive device utilized: None Level of assistance: Complete Independence Comments: decreased bil step  height, decreased   TREATMENT DATE:  [facility] Adult PT Treatment:                                                DATE: 04/19/24 Pt seen for aquatic therapy today.  Treatment took place in water 3.5-4.75 ft in depth at the [facility] pool. Temp of water was 91.  Pt entered/exited the pool via stairs using alternating pattern with hand rail.    *walking forward, back and side stepping in 3.6 ft with unsupported *seated on solid noodle swing like: PPT x 3; hip hiking (Pt reports feeling really stiff after) *TrA engagement: cues for abd bracing, RBHB noodle pull down x 10 wide stance then staggered stances.  - squatted rest period *noodle wrapped posteriorly across chest: cycling *ue support on corner wall: hip add/abd; hip flex/ext (some LB discomfort with end range) * Farmers carry using RBHB bilaterally then unilaterally *side stepping ue RBHB add/abd x 2 widths->slight side lunge to toleration x 2 widths  - squatted rest period *3 way stretch using noodle: add; hamstring; gastroc , IT band.  *walking between exercises for recovery.     Pt requires the buoyancy and hydrostatic pressure of water for support, and to offload joints by unweighting joint load by at least 50 % in navel deep water and by at least 75-80% in chest to neck deep water.  Viscosity of the water is needed for resistance of strengthening. Water current perturbations provides challenge to standing balance requiring increased core  activation.   04/14/24 Nustep x 5 min level 3 (PT present to discuss status) Standing hamstring stretch 3 x 30 sec each LE Standing quad/hip flexor stretch 3 x 30 sec each LE Seated piriformis stretch 3 x 30 sec each LE Hook lying PPT x 20 PPT with 90/90 heel tap x 20 PPT with dying bug x 20 unsupported  PPT with SLR and shoulder extension using 5lb dumbbell Ice to lumbar spine x 10 min at end of session in hook lying with legs propped on Bolster  Examination completed, findings reviewed, pt educated on POC, HEP. Pt motivated to participate in PT and agreeable to attempt recommendations.   For all possible CPT codes, reference the Planned Interventions line above.     If treatment provided at initial evaluation, no treatment charged due to lack of authorization.       PATIENT EDUCATION:  Education details: [id number]  Person educated: Patient Education method: Explanation, Demonstration, Tactile cues, Verbal cues, and Handouts Education comprehension: verbalized understanding, returned demonstration, verbal cues required, tactile cues required, and needs further education  HOME EXERCISE PROGRAM: [id number]   ASSESSMENT:  CLINICAL IMPRESSION: Shirley report the PPT makes her back feel good as completed last land session.  Able to recreate sitting on noodle although hip hiking increased LB stiffness.  She is limited with hip abd and extension towards end range as LBP right side increases.  Modifying range reduces/eliminates discomfort.  VC and demonstrations provided for execution of exercises. Cuing for slowed pacing. No change in pain sensitivity today from start to finish.  She gives excellent effort. Goals ongoing    OBJECTIVE IMPAIRMENTS: decreased activity tolerance, decreased coordination, decreased endurance, decreased mobility, difficulty walking, decreased ROM, decreased strength, increased fascial restrictions, impaired perceived functional ability, increased muscle spasms,  impaired flexibility, impaired sensation, improper body mechanics, postural dysfunction, and  pain.   ACTIVITY LIMITATIONS: carrying, lifting, bending, sitting, standing, squatting, stairs, and locomotion level  PARTICIPATION LIMITATIONS: meal prep, cleaning, laundry, driving, shopping, community activity, and yard work  PERSONAL FACTORS: Fitness, Past/current experiences, and Time since onset of injury/illness/exacerbation are also affecting patient's functional outcome.   REHAB POTENTIAL: Good  CLINICAL DECISION MAKING: Stable/uncomplicated  EVALUATION COMPLEXITY: Low   GOALS: Goals reviewed with patient? Yes  SHORT TERM GOALS: Target date: 05/02/24  Pt to be I with HEP for carry over and continuing recommendations for improved outcomes.   Baseline: Goal status: INITIAL  2.  Pt to report ability to tolerate standing at least 30 mins without worsening of pain for ability to complete vacuuming at home.  Baseline: 5 mins with worsening pain Goal status: INITIAL  3.  Pt to demonstrate ability to complete x10 body weight squats without worsening pain for improved ability to tolerate hip and core strengthening for returning to work outs.  Baseline:  Goal status: INITIAL     LONG TERM GOALS: Target date: 05/30/24  Pt to be I with advanced HEP for carry over and continuing recommendations for improved outcomes.   Baseline:  Goal status: INITIAL  2.  Pt to demonstrate full ROM of spine in all directions without increase in pain for improved tolerance to walking.  Baseline:  Goal status: INITIAL  3.  Pt to report ability to tolerate prolonged standing for 1 hour to tolerate grocery shopping without increase in pain.  Baseline:  Goal status: INITIAL  4.  Pt to demonstrate ability to complete x10 20# squats for improved hip strength and stability for walking at least 2 hours without increase in pain.  Baseline:  Goal status: INITIAL  5.  Pt to demonstrate improved core  strength and pelvic stability to decreased strain at low back by demonstrating ability to standing without sway in single leg stance for at least 15s each.  Baseline:  Goal status: INITIAL  6.  Pt to demonstrate at least 5/5 bil hip strength for improved pelvic stability and functional squats without pain for tolerance to carrying in groceries.  Baseline:  Goal status: INITIAL    PLAN:  PT FREQUENCY: 2x/week  PT DURATION: 8 weeks  PLANNED INTERVENTIONS: 97110-Therapeutic exercises, 97530- Therapeutic activity, 97112- Neuromuscular re-education, 97535- Self Care, 02859- Manual therapy, [id number]- Aquatic Therapy, [phone]- Electrical stimulation (unattended), [phone]- Electrical stimulation (manual), [id number]- Vasopneumatic device, [id number]- Ultrasound, [id number]- Traction (mechanical), [id number]- Ionotophoresis 4mg /ml Dexamethasone, [id number] (1-2 muscles), 20561 (3+ muscles)- Dry Needling, Patient/Family education, Balance training, Taping, Joint mobilization, Joint manipulation, Spinal manipulation, Spinal mobilization, Scar mobilization, Vestibular training, DME instructions, Cryotherapy, Moist heat, and Biofeedback.  PLAN FOR NEXT SESSION: aquatic PT for hip and core strengthening and spinal mobility; land based - for beginner core and hip strengthening  Ronal Kem) Brandley Aldrete MPT 04/19/24 9:48 AM [facility] [address] [place], [place], [address] Phone: [phone]   Fax:  [phone]

## 2024-04-19 NOTE — Progress Notes (Signed)
 Time: 2:00pm-2:57pm CPT Code: 09162E Diagnosis: Adjustment disorder with anxiety  Mary Wyatt was seen in person for individual therapy. Session focused on processing her most recently ended relationship. Therapist engaged her in discussion of dynamics in the relationship, exploring patterns with previous relationships. Mary Wyatt requested a book recommendation to help her process, and therapist suggested looking into Broken Open, by [employer]. She is scheduled to be seen again in one week.  Intake Presenting Problem Mary Wyatt transitioned from therapy with the present therapist to pursue ERP through the platform [email or web address]. She continued OCD-targeted therapy for several years with a different provider. Since that time, she has moved back in with her parents, and has learned more about how her OCD developed. She was also in a two-year relationship during this time. She became concerned that therapy may have become a compulsion that prevented her from engaging with challenges in her life. She ended therapy and ended the relationship shortly thereafter. She lived with her boyfriend's parents from August 2024 for 6 months. She reported that she has lived with her parents for 1 of the past 10 months, after which time she moved back in with her boyfriend. The relationship ended at the end of April, 2025. She had moved out of their condo as of June 2025. Symptoms Anger, anxiety History of Problem  Mary Wyatt reported that living with her ex-boyfriend was especially challenging due to his tendency toward messiness. She has also found the move back in with her parents challenging. Her current struggle relates to having noticed that her parents do not wash their hands after going to the bathroom. Mary Wyatt has been unemployed for a year.  Recent Trigger  Mary Wyatt returned for CBT following a series of major life events. She has pursued ERP for OCD since 2022.  Marital and Family Information  Mary Wyatt has two siblings, one of whom  is in [place] and the other is in [place]. She and her siblings have experienced challenges with their parents related to different world views. She has struggled to set boundaries with her parents.   Present family concerns/problems: Mary Wyatt is in contact with her family and exploring how she might set boundaries with her parents.   Strengths/resources in the family/friends: Mary Wyatt reconnected in recent years with a friend who has a similar background to hers. They get together regularly.    Interpersonal concerns/problems: Mary Wyatt was in her most recent job for over 1.5 years. She described the work environment as highly toxic. She took FMLA for her mental health due to work-related stress. She ultimately left in August 2024, following a conflict with a coworker she had believed was a friend.  General Behavior: WNL Attire: WNL Gait: WNL Motor Activity: WNL Stream of Thought - Productivity: WNL Stream of thought - Progression: WNL Stream of thought - Language:  WNL Emotional tone and reactions - Mood: WNL  Emotional tone and reactions - Affect: WNL Mental trend/Content of thoughts - Perception: WNL  Mental trend/Content of thoughts - Orientation: WNL Mental trend/Content of thoughts - Memory: WNL Mental trend/Content of thoughts - General knowledge: WNL  Insight: WNL Judgment: WNL Intelligence: WNL Mental Status Comment: Client appears oriented to time and space. Overall functioning WNL  Adjustment disorder with anxiety   Treatment Plan Client Abilities/Strengths  Mary Wyatt is reflective, uninhibited  Client Treatment Preferences Mary Wyatt prefers in person sessions. She prefers sessions that fit with her work schedule and are not too early in the morning.  Client Statement of Needs  Mary Wyatt shared that she is seeking  an outside, unattached perspective on her life situation and experiences.  Treatment Level  She would like biweekly sessions.  Symptoms  Anxiety, anger, sleep challenges  (she described herself as a light sleeper whose sleep is easily disrupted) Problems Addressed  Mary Wyatt shared that she has a tendency toward negativity that she would like to address.  Goals 1. Mary Wyatt would like to process her breakup with her ex-boyfriend.  Objective Mary Wyatt would like to better understand the role she wants romantic relationship to play in her life, if any  Target Date: 04/06/2025 Frequency: biweekly  Progress: 0 Modality: Individual therapy  Related Interventions Mary Wyatt will have opportunities to process her experiences in session Therapist will help Mary Wyatt notice and disengage from maladaptive thoughts and behaviors using CBT based strategies Therapist will work with Mary Wyatt to establish and set healthy boundaries in relationship Therapist will provide referrals for additional resources as appropriate   Adjustment disorder with Anxiety               Mary [name], Mary Wyatt               Johnthan Axtman L Senna Lape, Mary Wyatt

## 2024-04-20 ENCOUNTER — Encounter: Payer: Self-pay | Admitting: Physical Therapy

## 2024-04-20 ENCOUNTER — Ambulatory Visit: Attending: Pain Medicine | Admitting: Physical Therapy

## 2024-04-20 DIAGNOSIS — M6281 Muscle weakness (generalized): Secondary | ICD-10-CM | POA: Diagnosis present

## 2024-04-20 DIAGNOSIS — R252 Cramp and spasm: Secondary | ICD-10-CM | POA: Insufficient documentation

## 2024-04-20 DIAGNOSIS — R262 Difficulty in walking, not elsewhere classified: Secondary | ICD-10-CM | POA: Diagnosis present

## 2024-04-20 DIAGNOSIS — R293 Abnormal posture: Secondary | ICD-10-CM | POA: Insufficient documentation

## 2024-04-20 DIAGNOSIS — M5459 Other low back pain: Secondary | ICD-10-CM | POA: Diagnosis present

## 2024-04-20 DIAGNOSIS — M62838 Other muscle spasm: Secondary | ICD-10-CM | POA: Diagnosis present

## 2024-04-20 NOTE — Therapy (Addendum)
 OUTPATIENT PHYSICAL THERAPY THORACOLUMBAR TREATMENT   Patient Name: Mary Wyatt MRN: [id number] DOB:[birth year], 38 y.o., female Today's Date: 04/20/2024  END OF SESSION:  PT End of Session - 04/20/24 1448     Visit Number 5    Number of Visits 6    Date for PT Re-Evaluation 05/30/24    Authorization Type [employer] - submitting for continued auth on 04/20/24    Authorization Time Period 6 visits 04/04/24 to 8/14    Authorization - Visit Number 5    Authorization - Number of Visits 6    Progress Note Due on Visit 5    PT Start Time 1447    PT Stop Time 1530    PT Time Calculation (min) 43 min    Activity Tolerance Patient tolerated treatment well    Behavior During Therapy [facility] for tasks assessed/performed            Past Medical History:  Diagnosis Date   Abdominal pain    Abnormal EKG    Dec 2019 and Jan 2020/ saw cardiologist   Allergy    Altered bowel function    Anemia    Anxiety    Biliary dyskinesia    Celiac disease    Chest tightness    Depression    Dermatitis    Deviated septum    DOE (dyspnea on exertion)    Dysmenorrhea    Endometriosis    Episodic lightheadedness    ETD (Eustachian tube dysfunction), bilateral    Family history of brain aneurysm    Genital warts    GERD (gastroesophageal reflux disease)    Hashimoto's disease    History of meningioma    Hypotension    Insomnia    Interstitial cystitis    Meningioma (HCC)    Mononucleosis    Pelvic pain    Perineal pain    Post-operative nausea and vomiting    Rhinitis    [facility] spotted fever) 2015   S/P resection of meningioma    Seasonal allergic rhinitis    Tendinitis    Ulcer    Unspecified dyspareunia    Urethral syndrome    Vitamin D  deficiency    Past Surgical History:  Procedure Laterality Date   COLONOSCOPY  2013   CRANIOTOMY  09/2018   laproscopic surgery     2013 and 2019   OVARIAN CYST REMOVAL     TONSILLECTOMY     UPPER GASTROINTESTINAL  ENDOSCOPY  2013   Patient Active Problem List   Diagnosis Date Noted   Hypotension - chronic 09/19/2021   Anxiety and depression 06/15/2017   Family history of brain aneurysm 04/19/2017   Rhinitis, allergic 04/20/2014   Generalized anxiety disorder 04/20/2014   Celiac disease 12/15/2012   Interstitial cystitis     PCP: Leila Lucie LABOR, MD  REFERRING PROVIDER: Constantino Bong, MD   REFERRING DIAG: M54.50 (ICD-10-CM) - Low back pain, unspecified  Rationale for Evaluation and Treatment: Rehabilitation  THERAPY DIAG:  Other low back pain  Muscle weakness (generalized)  Cramp and spasm  Difficulty in walking, not elsewhere classified  Abnormal posture  ONSET DATE: years  SUBJECTIVE:  SUBJECTIVE STATEMENT: Exercises can hurt while I'm going them but then I usually feel better the day after.  The pain is still there but less intense.   PERTINENT HISTORY:  recurrent BV and yeast,anxiety, celiac disease, depression, endometriosis, genital warts, IC, CRANIOTOMY 2019,   PAIN:  04/14/24 Are you having pain? Yes: NPRS scale: 5/10 but worsens with activity, gets down to a 4/10 the day after PT Pain location: mid to low back bil Pain description: spasm in mid back (with standing/activity), low back is constant dull achy Aggravating factors: walking with mild increase; static standing starts to spasm after 5 mins, stairs Relieving factors: mid back with rest; low back is constant  PRECAUTIONS: None  RED FLAGS: None   WEIGHT BEARING RESTRICTIONS: No  FALLS:  Has patient fallen in last 6 months? No  LIVING ENVIRONMENT: Lives with: lives with their family Lives in: House/apartment Stairs: No Has following equipment at home: None  OCCUPATION: not currently  PLOF: Independent  PATIENT  GOALS: to have less pain, wants to be able to stand for 2 hours for better tolerance for working and social activities  NEXT MD VISIT: 04/12/24 MRI scheduled for spine  OBJECTIVE:  Note: Objective measures were completed at Evaluation unless otherwise noted.  DIAGNOSTIC FINDINGS:  Impression: MRI spine lumbar without contrast 09/2024 Small right extra foraminal disc protrusion at L4-5, closely approximating and potentially irritation the exiting right L4 nerve root Central disc protrusion at L5-S1, closely approximating the descending S1 nerve roots without overt neural impingement   Impression:normal findings at thoracic spine with MRI 09/2024  Impression: MRI CERVICAL WITHOUT CONTRAST 09/2024 Small central disc protrusion with uncovertebral spurring at C5-6 with resultant borderline mild spinal stenosis with mild right C6 foraminal narrowing Minimal disc bulging at C4-5 and C6-7 without stenosis or impingement    PATIENT SURVEYS:  04/20/24: Oswestry Score: 21/50 or 42% (unchanged to date) EVAL: Oswestry Score: 21 / 50 or 42 %  COGNITION: Overall cognitive status: Within functional limits for tasks assessed     SENSATION: Reports some pins and needles constant in bil feet (feels like electric current); and reports sometimes has tingling in bil hands but not consistent and not worse with pain   MUSCLE LENGTH: 04/20/24:  Significant restriction of bil quads and hip flexors, adductors, gluteals Lt>Rt - anterior hip tension pulling on lumbar spine Improving HS length bil  Eval: Hamstrings: bil limited by 25% Adductors limited by 25% bil and trigger points present bil as well   POSTURE: rounded shoulders, increased thoracic kyphosis, and posterior pelvic tilt  PALPATION:  04/20/24: Very tender bil SI joint with superior apsect of glute max atrophy present Tight and tender with trigger points glut med, min, proximal quads, hip flexors  Eval: TTP throughout midline and bil  paraspinals in lumbar spine but worse at Lt side; Lt SIJ most tender area, bil piriformis also TTP  LUMBAR ROM:   AROM eval 04/20/24  Flexion Pain with immediate flexion mobility but able to complete and limited by 25% Full flexion fingers to toes with lumbar stretch, no pain  Extension Limited by 25% Limited 25% with pain in lumbar  Right lateral flexion Fingertips to 2 in above knee with pain Fingertips to knees with Lt lumbar pain  Left lateral flexion Fingertips to 2 in above knee with pain Fingertips to knees with Lt lumbar pain  Right rotation WFL WNL  Left rotation WFL WNL   (Blank rows = not tested)  LOWER EXTREMITY ROM:  04/20/24   Limited Lt end range ER and hip flexion with pain EVAL: WFL but pain with bil hip flexion, bil hip ER  LOWER EXTREMITY MMT:     04/20/24:   Hip abd 4+/5 but quick to fatigue with repeated testing  Eval: Bil hips grossly 4+/5 except for flexion and abduction 4/5  LUMBAR SPECIAL TESTS:  04/20/24 SLR + bil for LBP > LE pain  EVAL Straight leg raise test: pain bil in low back, Single leg stance test: no pain either side, SI Compression/distraction test: compression improved pain, and FABER test: Negative  FUNCTIONAL TESTS:  EVAL: Functional squat - mild bil knee valgus and reports back pain at full end range of squat   Sit up test - 2/3 GAIT: Distance walked: 250' Assistive device utilized: None Level of assistance: Complete Independence Comments: decreased bil step height, decreased   TREATMENT DATE:  04/20/24 NuStep L4 x 6' PT present to review goals Posterior pelvic tilt Posterior pelvic tilt with dead bug isometric 2x10, then slow toe taps to table from 90/90 x10 Quadruped TA indraw 5x10 Supine hooklying piriformis stretch (pull knee across) x30 bil Attempted multiple positions to find a tolerated hip flexor/quad stretch but Pt continues to have pain with all efforts - holding on adding this to HEP Progressed HEP to include  piriformis stretch and supine core stabilization from today's session  [facility] Adult PT Treatment:                                                DATE: 04/19/24 Pt seen for aquatic therapy today.  Treatment took place in water 3.5-4.75 ft in depth at the [facility] pool. Temp of water was 91.  Pt entered/exited the pool via stairs using alternating pattern with hand rail.    *walking forward, back and side stepping in 3.6 ft with unsupported *seated on solid noodle swing like: PPT x 3; hip hiking (Pt reports feeling really stiff after) *TrA engagement: cues for abd bracing, RBHB noodle pull down x 10 wide stance then staggered stances.  - squatted rest period *noodle wrapped posteriorly across chest: cycling *ue support on corner wall: hip add/abd; hip flex/ext (some LB discomfort with end range) * Farmers carry using RBHB bilaterally then unilaterally *side stepping ue RBHB add/abd x 2 widths->slight side lunge to toleration x 2 widths  - squatted rest period *3 way stretch using noodle: add; hamstring; gastroc , IT band.  *walking between exercises for recovery.     Pt requires the buoyancy and hydrostatic pressure of water for support, and to offload joints by unweighting joint load by at least 50 % in navel deep water and by at least 75-80% in chest to neck deep water.  Viscosity of the water is needed for resistance of strengthening. Water current perturbations provides challenge to standing balance requiring increased core activation.   04/14/24 Nustep x 5 min level 3 (PT present to discuss status) Standing hamstring stretch 3 x 30 sec each LE Standing quad/hip flexor stretch 3 x 30 sec each LE Seated piriformis stretch 3 x 30 sec each LE Hook lying PPT x 20 PPT with 90/90 heel tap x 20 PPT with dying bug x 20 unsupported  PPT with SLR and shoulder extension using 5lb dumbbell Ice to lumbar spine x 10 min at end of session in hook lying with  legs propped on  Bolster  Examination completed, findings reviewed, pt educated on POC, HEP. Pt motivated to participate in PT and agreeable to attempt recommendations.   For all possible CPT codes, reference the Planned Interventions line above.     If treatment provided at initial evaluation, no treatment charged due to lack of authorization.       PATIENT EDUCATION:  Education details: [email or web address]  Person educated: Patient Education method: Explanation, Demonstration, Tactile cues, Verbal cues, and Handouts Education comprehension: verbalized understanding, returned demonstration, verbal cues required, tactile cues required, and needs further education  HOME EXERCISE PROGRAM: Access Code: [email or web address] URL: [email or web address] Date: 04/20/2024 Prepared by: Orvil Lithzy Bernard  Exercises - Seated Diaphragmatic Breathing  - 1 x daily - 7 x weekly - 2 sets - 10 reps - Supine Diaphragmatic Breathing  - 1 x daily - 7 x weekly - 2 sets - 10 reps - Supine Lower Trunk Rotation  - 1 x daily - 7 x weekly - 1 sets - 10 reps - 5s holds - Supine Butterfly Groin Stretch  - 1 x daily - 7 x weekly - 3 sets - 30s hold - Supine Single Knee to Chest  - 1 x daily - 7 x weekly - 1 sets - 3 reps - 30s holds - Diaphragmatic Breathing in Child's Pose with Pelvic Floor Relaxation  - 1 x daily - 7 x weekly - 3 sets - 30s hold - Figure 4 Gluteus Mobilization on Foam Roll  - 1 x daily - 7 x weekly - 1 sets - 10 reps - Beginner Bridge  - 1 x daily - 7 x weekly - 1 sets - 10 reps - Bird Dog  - 1 x daily - 7 x weekly - 1 sets - 10 reps - Standing Diagonal Chop  - 1 x daily - 7 x weekly - 1 sets - 10 reps - clamshell and marching  - 1 x daily - 7 x weekly - 1 sets - 10 reps - Supine Posterior Pelvic Tilt  - 1 x daily - 7 x weekly - 1 sets - 10 reps - 5 hold - Supine 90/90 Alternating Heel Touches with Posterior Pelvic Tilt  - 1 x daily - 7 x weekly - 1 sets - 10 reps - Supine Piriformis Stretch with Foot on Ground  - 1 x  daily - 7 x weekly - 1 sets - 2 reps - 20-30 hold - Isometric Dead Bug  - 1 x daily - 7 x weekly - 1 sets - 5 reps - 10 hold - Quadruped Transversus Abdominis Bracing  - 1 x daily - 7 x weekly - 1 sets - 10 reps - 5 hold  ASSESSMENT:  CLINICAL IMPRESSION: Mary Wyatt is making slow but steady progress toward goals with combination of aquatic and land-based therapies.  She reports that she will have pain during the exercises but that the next day her pain is less intense.  Today she demonstrated improved trunk flexion to full ROM with end range pain, which is much improved from evaluation.  She demonstrates stronger hip engagement of muscles but is quick to fatigue.  She is working on core stabilization but requires significant cueing for proper technique to to engage core without holding breath.   Arkie has a lot of flexibility restrictions of the LE which are adding undue stress to lumbar spine, hips and pelvis.  We are working on building a stretching/mobility program for her that is tolerable within her pain level.  Her ODI is unchanged since start of PT but given degree of mobility restrictions and weakness present this PT believes it will take more time to create a change with the ODI. We progressed HEP today to include some stretches and core strength.  Pt needs ongoing assessment, cueing and careful progression toward goals and will benefit from extended plan of care to reduce pain, improve strength for tasks such as vacuuming, squatting, lifting and carrying to perform household tasks, and increase standing tolerance for grocery shopping.    OBJECTIVE IMPAIRMENTS: decreased activity tolerance, decreased coordination, decreased endurance, decreased mobility, difficulty walking, decreased ROM, decreased strength, increased fascial restrictions, impaired perceived functional ability, increased muscle spasms, impaired flexibility, impaired sensation, improper body mechanics, postural dysfunction, and pain.    ACTIVITY LIMITATIONS: carrying, lifting, bending, sitting, standing, squatting, stairs, and locomotion level  PARTICIPATION LIMITATIONS: meal prep, cleaning, laundry, driving, shopping, community activity, and yard work  PERSONAL FACTORS: Fitness, Past/current experiences, and Time since onset of injury/illness/exacerbation are also affecting patient's functional outcome.   REHAB POTENTIAL: Good  CLINICAL DECISION MAKING: Stable/uncomplicated  EVALUATION COMPLEXITY: Low   GOALS: Goals reviewed with patient? Yes  SHORT TERM GOALS: Target date: 05/02/24  Pt to be I with HEP for carry over and continuing recommendations for improved outcomes.   Baseline: Goal status: MET 04/20/24  2.  Pt to report ability to tolerate standing at least 30 mins without worsening of pain for ability to complete vacuuming at home.  Baseline: 5 mins with worsening pain Goal status: ONGOING, 7 MIN, 7/2  3.  Pt to demonstrate ability to complete x10 body weight squats without worsening pain for improved ability to tolerate hip and core strengthening for returning to work outs.  Baseline:  Goal status: INITIAL     LONG TERM GOALS: Target date: 05/30/24  Pt to be I with advanced HEP for carry over and continuing recommendations for improved outcomes.   Baseline:  Goal status: INITIAL  2.  Pt to demonstrate full ROM of spine in all directions without increase in pain for improved tolerance to walking.  Baseline:  Goal status: INITIAL  3.  Pt to report ability to tolerate prolonged standing for 1 hour to tolerate grocery shopping without increase in pain.  Baseline:  Goal status: INITIAL  4.  Pt to demonstrate ability to complete x10 20# squats for improved hip strength and stability for walking at least 2 hours without increase in pain.  Baseline:  Goal status: INITIAL  5.  Pt to demonstrate improved core strength and pelvic stability to decreased strain at low back by demonstrating ability to  standing without sway in single leg stance for at least 15s each.  Baseline:  Goal status: INITIAL  6.  Pt to demonstrate at least 5/5 bil hip strength for improved pelvic stability and functional squats without pain for tolerance to carrying in groceries.  Baseline:  Goal status: INITIAL    PLAN:  PT FREQUENCY: 2x/week  PT DURATION: 8 weeks  PLANNED INTERVENTIONS: 97110-Therapeutic exercises, 97530- Therapeutic activity, 97112- Neuromuscular re-education, 97535- Self Care, [id number]- Manual therapy, [phone]- Aquatic Therapy, [phone]- Electrical stimulation (unattended), [phone]- Electrical stimulation (manual), [id number]- Vasopneumatic device, [id number]- Ultrasound, [id number]- Traction (mechanical), [id number]- Ionotophoresis 4mg /ml Dexamethasone, [id number] (1-2 muscles), [id number] (3+ muscles)- Dry Needling, Patient/Family education, Balance training, Taping, Joint mobilization, Joint manipulation, Spinal manipulation, Spinal mobilization, Scar mobilization, Vestibular training, DME instructions, Cryotherapy, Moist heat, and Biofeedback.  PLAN FOR NEXT SESSION: aquatic PT for hip and core strengthening and spinal mobility; land  based - for beginner core and hip strengthening  Orvil Fester, PT 04/20/24 5:23 PM

## 2024-04-25 ENCOUNTER — Encounter (HOSPITAL_BASED_OUTPATIENT_CLINIC_OR_DEPARTMENT_OTHER): Payer: Self-pay | Admitting: Physical Therapy

## 2024-04-25 ENCOUNTER — Ambulatory Visit (HOSPITAL_BASED_OUTPATIENT_CLINIC_OR_DEPARTMENT_OTHER): Admitting: Physical Therapy

## 2024-04-25 DIAGNOSIS — M5459 Other low back pain: Secondary | ICD-10-CM | POA: Diagnosis not present

## 2024-04-25 DIAGNOSIS — M6281 Muscle weakness (generalized): Secondary | ICD-10-CM

## 2024-04-25 DIAGNOSIS — R252 Cramp and spasm: Secondary | ICD-10-CM

## 2024-04-25 NOTE — Therapy (Signed)
 OUTPATIENT PHYSICAL THERAPY THORACOLUMBAR TREATMENT   Patient Name: Mary Wyatt MRN: [id number] DOB:1986/05/22, 38 y.o., female Today's Date: 04/25/2024  END OF SESSION:  PT End of Session - 04/25/24 1312     Visit Number 6    Number of Visits 6    Date for PT Re-Evaluation 05/30/24    Authorization Type [employer] - submitting for continued auth on 04/20/24    Authorization Time Period 6 visits 04/04/24 to 8/14    Authorization - Visit Number 6    Authorization - Number of Visits 6    Progress Note Due on Visit 5    PT Start Time 1302    PT Stop Time 1340    PT Time Calculation (min) 38 min    Activity Tolerance Patient tolerated treatment well    Behavior During Therapy [facility] for tasks assessed/performed             Past Medical History:  Diagnosis Date   Abdominal pain    Abnormal EKG    Dec 2019 and Jan 2020/ saw cardiologist   Allergy    Altered bowel function    Anemia    Anxiety    Biliary dyskinesia    Celiac disease    Chest tightness    Depression    Dermatitis    Deviated septum    DOE (dyspnea on exertion)    Dysmenorrhea    Endometriosis    Episodic lightheadedness    ETD (Eustachian tube dysfunction), bilateral    Family history of brain aneurysm    Genital warts    GERD (gastroesophageal reflux disease)    Hashimoto's disease    History of meningioma    Hypotension    Insomnia    Interstitial cystitis    Meningioma (HCC)    Mononucleosis    Pelvic pain    Perineal pain    Post-operative nausea and vomiting    Rhinitis    RMS[facility] spotted fever) 2015   S/P resection of meningioma    Seasonal allergic rhinitis    Tendinitis    Ulcer    Unspecified dyspareunia    Urethral syndrome    Vitamin D  deficiency    Past Surgical History:  Procedure Laterality Date   COLONOSCOPY  2013   CRANIOTOMY  09/2018   laproscopic surgery     2013 and 2019   OVARIAN CYST REMOVAL     TONSILLECTOMY     UPPER GASTROINTESTINAL  ENDOSCOPY  2013   Patient Active Problem List   Diagnosis Date Noted   Hypotension - chronic 09/19/2021   Anxiety and depression 06/15/2017   Family history of brain aneurysm 04/19/2017   Rhinitis, allergic 04/20/2014   Generalized anxiety disorder 04/20/2014   Celiac disease 12/15/2012   Interstitial cystitis     PCP: Leila Lucie LABOR, MD  REFERRING PROVIDER: Constantino Bong, MD   REFERRING DIAG: M54.50 (ICD-10-CM) - Low back pain, unspecified  Rationale for Evaluation and Treatment: Rehabilitation  THERAPY DIAG:  Muscle weakness (generalized)  Cramp and spasm  Other low back pain  ONSET DATE: years  SUBJECTIVE:  SUBJECTIVE STATEMENT: Almost feel more stiffness than I do pain  PERTINENT HISTORY:  recurrent BV and yeast,anxiety, celiac disease, depression, endometriosis, genital warts, IC, CRANIOTOMY 2019,   PAIN:  04/14/24 Are you having pain? Yes: NPRS scale: 4/10 but worsens with activity, gets down to a 4/10 the day after PT Pain location: mid to low back bil Pain description: spasm in mid back (with standing/activity), low back is constant dull achy Aggravating factors: walking with mild increase; static standing starts to spasm after 5 mins, stairs Relieving factors: mid back with rest; low back is constant  PRECAUTIONS: None  RED FLAGS: None   WEIGHT BEARING RESTRICTIONS: No  FALLS:  Has patient fallen in last 6 months? No  LIVING ENVIRONMENT: Lives with: lives with their family Lives in: House/apartment Stairs: No Has following equipment at home: None  OCCUPATION: not currently  PLOF: Independent  PATIENT GOALS: to have less pain, wants to be able to stand for 2 hours for better tolerance for working and social activities  NEXT MD VISIT: 04/12/24 MRI  scheduled for spine  OBJECTIVE:  Note: Objective measures were completed at Evaluation unless otherwise noted.  DIAGNOSTIC FINDINGS:  Impression: MRI spine lumbar without contrast 09/2024 Small right extra foraminal disc protrusion at L4-5, closely approximating and potentially irritation the exiting right L4 nerve root Central disc protrusion at L5-S1, closely approximating the descending S1 nerve roots without overt neural impingement   Impression:normal findings at thoracic spine with MRI 09/2024  Impression: MRI CERVICAL WITHOUT CONTRAST 09/2024 Small central disc protrusion with uncovertebral spurring at C5-6 with resultant borderline mild spinal stenosis with mild right C6 foraminal narrowing Minimal disc bulging at C4-5 and C6-7 without stenosis or impingement    PATIENT SURVEYS:  04/20/24: Oswestry Score: 21/50 or 42% (unchanged to date) EVAL: Oswestry Score: 21 / 50 or 42 %  COGNITION: Overall cognitive status: Within functional limits for tasks assessed     SENSATION: Reports some pins and needles constant in bil feet (feels like electric current); and reports sometimes has tingling in bil hands but not consistent and not worse with pain   MUSCLE LENGTH: 04/20/24:  Significant restriction of bil quads and hip flexors, adductors, gluteals Lt>Rt - anterior hip tension pulling on lumbar spine Improving HS length bil  Eval: Hamstrings: bil limited by 25% Adductors limited by 25% bil and trigger points present bil as well   POSTURE: rounded shoulders, increased thoracic kyphosis, and posterior pelvic tilt  PALPATION:  04/20/24: Very tender bil SI joint with superior apsect of glute max atrophy present Tight and tender with trigger points glut med, min, proximal quads, hip flexors  Eval: TTP throughout midline and bil paraspinals in lumbar spine but worse at Lt side; Lt SIJ most tender area, bil piriformis also TTP  LUMBAR ROM:   AROM eval 04/20/24  Flexion Pain with  immediate flexion mobility but able to complete and limited by 25% Full flexion fingers to toes with lumbar stretch, no pain  Extension Limited by 25% Limited 25% with pain in lumbar  Right lateral flexion Fingertips to 2 in above knee with pain Fingertips to knees with Lt lumbar pain  Left lateral flexion Fingertips to 2 in above knee with pain Fingertips to knees with Lt lumbar pain  Right rotation [facility] WNL  Left rotation WFL WNL   (Blank rows = not tested)  LOWER EXTREMITY ROM:      04/20/24   Limited Lt end range ER and hip flexion with pain EVAL: [facility]  but pain with bil hip flexion, bil hip ER  LOWER EXTREMITY MMT:     04/20/24:   Hip abd 4+/5 but quick to fatigue with repeated testing  Eval: Bil hips grossly 4+/5 except for flexion and abduction 4/5  LUMBAR SPECIAL TESTS:  04/20/24 SLR + bil for LBP > LE pain  EVAL Straight leg raise test: pain bil in low back, Single leg stance test: no pain either side, SI Compression/distraction test: compression improved pain, and FABER test: Negative  FUNCTIONAL TESTS:  EVAL: Functional squat - mild bil knee valgus and reports back pain at full end range of squat   Sit up test - 2/3 GAIT: Distance walked: 250' Assistive device utilized: None Level of assistance: Complete Independence Comments: decreased bil step height, decreased   TREATMENT DATE:  [facility] Adult PT Treatment:                                                DATE: 04/25/24 Pt seen for aquatic therapy today.  Treatment took place in water 3.5-4.75 ft in depth at the [facility] pool. Temp of water was 91.  Pt entered/exited the pool via stairs using alternating pattern with hand rail.    *walking forward, back and side stepping in 3.6 ft with unsupported *seated on solid noodle swing like: PPT x 3; hip hiking; rotation. *quad and hip flex using solid noodle *side stepping ue RBHB add/abd x 2 widths->slight side lunge to toleration x 2 widths  - squatted rest  period *figure 4 stretch at steps *TrA engagement: cues for abd bracing, RBHB noodle pull down x 10 wide stance then staggered stances increased depth to 3.15ft. *Plank position: mountain climbers; fire hydrants; hip extension  - squatted rest period *noodle wrapped posteriorly across chest: cycling *walking between exercises for recovery.     Pt requires the buoyancy and hydrostatic pressure of water for support, and to offload joints by unweighting joint load by at least 50 % in navel deep water and by at least 75-80% in chest to neck deep water.  Viscosity of the water is needed for resistance of strengthening. Water current perturbations provides challenge to standing balance requiring increased core activation.  04/20/24 NuStep L4 x 6' PT present to review goals Posterior pelvic tilt Posterior pelvic tilt with dead bug isometric 2x10, then slow toe taps to table from 90/90 x10 Quadruped TA indraw 5x10 Supine hooklying piriformis stretch (pull knee across) x30 bil Attempted multiple positions to find a tolerated hip flexor/quad stretch but Pt continues to have pain with all efforts - holding on adding this to HEP Progressed HEP to include piriformis stretch and supine core stabilization from today's session  [facility] Adult PT Treatment:                                                DATE: 04/19/24 Pt seen for aquatic therapy today.  Treatment took place in water 3.5-4.75 ft in depth at the [facility] pool. Temp of water was 91.  Pt entered/exited the pool via stairs using alternating pattern with hand rail.    *walking forward, back and side stepping in 3.6 ft with unsupported *seated on solid noodle swing like: PPT x 3; hip  hiking (Pt reports feeling really stiff after) *TrA engagement: cues for abd bracing, RBHB noodle pull down x 10 wide stance then staggered stances.  - squatted rest period *noodle wrapped posteriorly across chest: cycling *ue support on corner wall: hip  add/abd; hip flex/ext (some LB discomfort with end range) * Farmers carry using RBHB bilaterally then unilaterally *side stepping ue RBHB add/abd x 2 widths->slight side lunge to toleration x 2 widths  - squatted rest period *3 way stretch using noodle: add; hamstring; gastroc , IT band.  *walking between exercises for recovery.     Pt requires the buoyancy and hydrostatic pressure of water for support, and to offload joints by unweighting joint load by at least 50 % in navel deep water and by at least 75-80% in chest to neck deep water.  Viscosity of the water is needed for resistance of strengthening. Water current perturbations provides challenge to standing balance requiring increased core activation.   04/14/24 Nustep x 5 min level 3 (PT present to discuss status) Standing hamstring stretch 3 x 30 sec each LE Standing quad/hip flexor stretch 3 x 30 sec each LE Seated piriformis stretch 3 x 30 sec each LE Hook lying PPT x 20 PPT with 90/90 heel tap x 20 PPT with dying bug x 20 unsupported  PPT with SLR and shoulder extension using 5lb dumbbell Ice to lumbar spine x 10 min at end of session in hook lying with legs propped on Bolster  Examination completed, findings reviewed, pt educated on POC, HEP. Pt motivated to participate in PT and agreeable to attempt recommendations.   For all possible CPT codes, reference the Planned Interventions line above.     If treatment provided at initial evaluation, no treatment charged due to lack of authorization.       PATIENT EDUCATION:  Education details: [email or web address]  Person educated: Patient Education method: Explanation, Demonstration, Tactile cues, Verbal cues, and Handouts Education comprehension: verbalized understanding, returned demonstration, verbal cues required, tactile cues required, and needs further education  HOME EXERCISE PROGRAM: Access Code: [email or web address] URL: [email or web address] Date: 04/20/2024 Prepared by:  Orvil Beuhring  Exercises - Seated Diaphragmatic Breathing  - 1 x daily - 7 x weekly - 2 sets - 10 reps - Supine Diaphragmatic Breathing  - 1 x daily - 7 x weekly - 2 sets - 10 reps - Supine Lower Trunk Rotation  - 1 x daily - 7 x weekly - 1 sets - 10 reps - 5s holds - Supine Butterfly Groin Stretch  - 1 x daily - 7 x weekly - 3 sets - 30s hold - Supine Single Knee to Chest  - 1 x daily - 7 x weekly - 1 sets - 3 reps - 30s holds - Diaphragmatic Breathing in Child's Pose with Pelvic Floor Relaxation  - 1 x daily - 7 x weekly - 3 sets - 30s hold - Figure 4 Gluteus Mobilization on Foam Roll  - 1 x daily - 7 x weekly - 1 sets - 10 reps - Beginner Bridge  - 1 x daily - 7 x weekly - 1 sets - 10 reps - Bird Dog  - 1 x daily - 7 x weekly - 1 sets - 10 reps - Standing Diagonal Chop  - 1 x daily - 7 x weekly - 1 sets - 10 reps - clamshell and marching  - 1 x daily - 7 x weekly - 1 sets - 10 reps - Supine Posterior Pelvic Tilt  - 1 x  daily - 7 x weekly - 1 sets - 10 reps - 5 hold - Supine 90/90 Alternating Heel Touches with Posterior Pelvic Tilt  - 1 x daily - 7 x weekly - 1 sets - 10 reps - Supine Piriformis Stretch with Foot on Ground  - 1 x daily - 7 x weekly - 1 sets - 2 reps - 20-30 hold - Isometric Dead Bug  - 1 x daily - 7 x weekly - 1 sets - 5 reps - 10 hold - Quadruped Transversus Abdominis Bracing  - 1 x daily - 7 x weekly - 1 sets - 10 reps - 5 hold  ASSESSMENT:  CLINICAL IMPRESSION: Visually pt demonstrating increase ROM bilat hip external rotation and abd with figure 4 stretch. She is tolerating with improvement all stretching of lumbar spine and hips without limitation to pain although her pain does slightly increase by end of session. She does continue to require rest periods due to higher fatigue level. Good progress in todays session.   Adalind is making slow but steady progress toward goals with combination of aquatic and land-based therapies.  She reports that she will have pain  during the exercises but that the next day her pain is less intense.  Today she demonstrated improved trunk flexion to full ROM with end range pain, which is much improved from evaluation.  She demonstrates stronger hip engagement of muscles but is quick to fatigue.  She is working on core stabilization but requires significant cueing for proper technique to to engage core without holding breath.   Rhylee has a lot of flexibility restrictions of the LE which are adding undue stress to lumbar spine, hips and pelvis.  We are working on building a stretching/mobility program for her that is tolerable within her pain level.  Her ODI is unchanged since start of PT but given degree of mobility restrictions and weakness present this PT believes it will take more time to create a change with the ODI. We progressed HEP today to include some stretches and core strength.  Pt needs ongoing assessment, cueing and careful progression toward goals and will benefit from extended plan of care to reduce pain, improve strength for tasks such as vacuuming, squatting, lifting and carrying to perform household tasks, and increase standing tolerance for grocery shopping.    OBJECTIVE IMPAIRMENTS: decreased activity tolerance, decreased coordination, decreased endurance, decreased mobility, difficulty walking, decreased ROM, decreased strength, increased fascial restrictions, impaired perceived functional ability, increased muscle spasms, impaired flexibility, impaired sensation, improper body mechanics, postural dysfunction, and pain.   ACTIVITY LIMITATIONS: carrying, lifting, bending, sitting, standing, squatting, stairs, and locomotion level  PARTICIPATION LIMITATIONS: meal prep, cleaning, laundry, driving, shopping, community activity, and yard work  PERSONAL FACTORS: Fitness, Past/current experiences, and Time since onset of injury/illness/exacerbation are also affecting patient's functional outcome.   REHAB POTENTIAL:  Good  CLINICAL DECISION MAKING: Stable/uncomplicated  EVALUATION COMPLEXITY: Low   GOALS: Goals reviewed with patient? Yes  SHORT TERM GOALS: Target date: 05/02/24  Pt to be I with HEP for carry over and continuing recommendations for improved outcomes.   Baseline: Goal status: MET 04/20/24  2.  Pt to report ability to tolerate standing at least 30 mins without worsening of pain for ability to complete vacuuming at home.  Baseline: 5 mins with worsening pain Goal status: ONGOING, 7 MIN, 7/2  3.  Pt to demonstrate ability to complete x10 body weight squats without worsening pain for improved ability to tolerate hip and core strengthening for  returning to work outs.  Baseline:  Goal status: INITIAL     LONG TERM GOALS: Target date: 05/30/24  Pt to be I with advanced HEP for carry over and continuing recommendations for improved outcomes.   Baseline:  Goal status: INITIAL  2.  Pt to demonstrate full ROM of spine in all directions without increase in pain for improved tolerance to walking.  Baseline:  Goal status: INITIAL  3.  Pt to report ability to tolerate prolonged standing for 1 hour to tolerate grocery shopping without increase in pain.  Baseline:  Goal status: INITIAL  4.  Pt to demonstrate ability to complete x10 20# squats for improved hip strength and stability for walking at least 2 hours without increase in pain.  Baseline:  Goal status: INITIAL  5.  Pt to demonstrate improved core strength and pelvic stability to decreased strain at low back by demonstrating ability to standing without sway in single leg stance for at least 15s each.  Baseline:  Goal status: INITIAL  6.  Pt to demonstrate at least 5/5 bil hip strength for improved pelvic stability and functional squats without pain for tolerance to carrying in groceries.  Baseline:  Goal status: INITIAL    PLAN:  PT FREQUENCY: 2x/week  PT DURATION: 8 weeks  PLANNED INTERVENTIONS: 97110-Therapeutic  exercises, 97530- Therapeutic activity, 97112- Neuromuscular re-education, 97535- Self Care, [id number]- Manual therapy, [phone]- Aquatic Therapy, [phone]- Electrical stimulation (unattended), [phone]- Electrical stimulation (manual), [id number]- Vasopneumatic device, [id number]- Ultrasound, [id number]- Traction (mechanical), [id number]- Ionotophoresis 4mg /ml Dexamethasone, [id number] (1-2 muscles), [id number] (3+ muscles)- Dry Needling, Patient/Family education, Balance training, Taping, Joint mobilization, Joint manipulation, Spinal manipulation, Spinal mobilization, Scar mobilization, Vestibular training, DME instructions, Cryotherapy, Moist heat, and Biofeedback.  PLAN FOR NEXT SESSION: aquatic PT for hip and core strengthening and spinal mobility; land based - for beginner core and hip strengthening  Ronal Kem) Geral Coker MPT 04/25/24 1:27 PM [facility] [address] [place], [place], [address] Phone: [phone]   Fax:  [phone]

## 2024-04-26 ENCOUNTER — Ambulatory Visit (INDEPENDENT_AMBULATORY_CARE_PROVIDER_SITE_OTHER): Admitting: Clinical

## 2024-04-26 ENCOUNTER — Encounter: Payer: Self-pay | Admitting: Radiology

## 2024-04-26 ENCOUNTER — Ambulatory Visit (INDEPENDENT_AMBULATORY_CARE_PROVIDER_SITE_OTHER): Admitting: Radiology

## 2024-04-26 VITALS — BP 110/60 | HR 79 | Wt 163.6 lb

## 2024-04-26 DIAGNOSIS — Z113 Encounter for screening for infections with a predominantly sexual mode of transmission: Secondary | ICD-10-CM | POA: Diagnosis not present

## 2024-04-26 DIAGNOSIS — N9489 Other specified conditions associated with female genital organs and menstrual cycle: Secondary | ICD-10-CM

## 2024-04-26 DIAGNOSIS — F4322 Adjustment disorder with anxiety: Secondary | ICD-10-CM | POA: Diagnosis not present

## 2024-04-26 DIAGNOSIS — R300 Dysuria: Secondary | ICD-10-CM | POA: Diagnosis not present

## 2024-04-26 NOTE — Progress Notes (Signed)
 Time: 2:00pm-2:57pm CPT Code: 09162E Diagnosis: Adjustment disorder with anxiety  Destynie was seen in person for individual therapy. Session focused on continuing to understand factors contributing to Alessandria's anxiety, including her job search and living situation. Therapist offered observations and encouraged Keylin to explore her strengths. She is scheduled to be seen again in one week.  Intake Presenting Problem Karizma transitioned from therapy with the present therapist to pursue ERP through the platform [email or web address]. She continued OCD-targeted therapy for several years with a different provider. Since that time, she has moved back in with her parents, and has learned more about how her OCD developed. She was also in a two-year relationship during this time. She became concerned that therapy may have become a compulsion that prevented her from engaging with challenges in her life. She ended therapy and ended the relationship shortly thereafter. She lived with her boyfriend's parents from August 2024 for 6 months. She reported that she has lived with her parents for 1 of the past 10 months, after which time she moved back in with her boyfriend. The relationship ended at the end of April, 2025. She had moved out of their condo as of June 2025. Symptoms Anger, anxiety History of Problem  Ariela reported that living with her ex-boyfriend was especially challenging due to his tendency toward messiness. She has also found the move back in with her parents challenging. Her current struggle relates to having noticed that her parents do not wash their hands after going to the bathroom. Elajah has been unemployed for a year.  Recent Trigger  Alfie returned for CBT following a series of major life events. She has pursued ERP for OCD since 2022.  Marital and Family Information  Shawndell has two siblings, one of whom is in [place] and the other is in [place]. She and her siblings have experienced challenges with their  parents related to different world views. She has struggled to set boundaries with her parents.   Present family concerns/problems: Azarah is in contact with her family and exploring how she might set boundaries with her parents.   Strengths/resources in the family/friends: Corah reconnected in recent years with a friend who has a similar background to hers. They get together regularly.    Interpersonal concerns/problems: Briunna was in her most recent job for over 1.5 years. She described the work environment as highly toxic. She took FMLA for her mental health due to work-related stress. She ultimately left in August 2024, following a conflict with a coworker she had believed was a friend.  General Behavior: WNL Attire: WNL Gait: WNL Motor Activity: WNL Stream of Thought - Productivity: WNL Stream of thought - Progression: WNL Stream of thought - Language:  WNL Emotional tone and reactions - Mood: WNL  Emotional tone and reactions - Affect: WNL Mental trend/Content of thoughts - Perception: WNL  Mental trend/Content of thoughts - Orientation: WNL Mental trend/Content of thoughts - Memory: WNL Mental trend/Content of thoughts - General knowledge: WNL  Insight: WNL Judgment: WNL Intelligence: WNL Mental Status Comment: Client appears oriented to time and space. Overall functioning WNL  Adjustment disorder with anxiety   Treatment Plan Client Abilities/Strengths  Donise is reflective, uninhibited  Client Treatment Preferences Veroncia prefers in person sessions. She prefers sessions that fit with her work schedule and are not too early in the morning.  Client Statement of Needs  Nicol shared that she is seeking an outside, unattached perspective on her life situation and experiences.  Treatment Level  She  would like biweekly sessions.  Symptoms  Anxiety, anger, sleep challenges (she described herself as a light sleeper whose sleep is easily disrupted) Problems Addressed  Darriana shared  that she has a tendency toward negativity that she would like to address.  Goals 1. Verlena would like to process her breakup with her ex-boyfriend.  Objective Cheryll would like to better understand the role she wants romantic relationship to play in her life, if any  Target Date: 04/06/2025 Frequency: biweekly  Progress: 0 Modality: Individual therapy  Related Interventions Nitzia will have opportunities to process her experiences in session Therapist will help Candee notice and disengage from maladaptive thoughts and behaviors using CBT based strategies Therapist will work with Lauraine to establish and set healthy boundaries in relationship Therapist will provide referrals for additional resources as appropriate   Adjustment disorder with Anxiety             Andriette [name], PhD               Whit Bruni L Kenia Teagarden, PhD

## 2024-04-26 NOTE — Progress Notes (Signed)
      Subjective: Mary Wyatt is a 38 y.o. female who complains of persistent vaginal burning. Treated for BV with cleocin , boric acid and hydrocortisone  last month. Hydrocortisone  made the burning worse. Continues to use boric acid weekly.  Recently broke up with BF, would like STI screen. Last sexual encounter 10 weeks ago.    Review of Systems  All other systems reviewed and are negative.   Past Medical History:  Diagnosis Date   Abdominal pain    Abnormal EKG    Dec 2019 and Jan 2020/ saw cardiologist   Allergy    Altered bowel function    Anemia    Anxiety    Biliary dyskinesia    Celiac disease    Chest tightness    Depression    Dermatitis    Deviated septum    DOE (dyspnea on exertion)    Dysmenorrhea    Endometriosis    Episodic lightheadedness    ETD (Eustachian tube dysfunction), bilateral    Family history of brain aneurysm    Genital warts    GERD (gastroesophageal reflux disease)    Hashimoto's disease    History of meningioma    Hypotension    Insomnia    Interstitial cystitis    Meningioma (HCC)    Mononucleosis    Pelvic pain    Perineal pain    Post-operative nausea and vomiting    Rhinitis    RMSF [facility] spotted fever) 2015   S/P resection of meningioma    Seasonal allergic rhinitis    Tendinitis    Ulcer    Unspecified dyspareunia    Urethral syndrome    Vitamin D  deficiency       Objective:  Today's Vitals   04/26/24 1011  BP: 110/60  Pulse: 79  SpO2: 97%  Weight: 163 lb 9.6 oz (74.2 kg)   Body mass index is 26.81 kg/m.   Physical Exam Vitals and nursing note reviewed. Exam conducted with a chaperone present.  Constitutional:      Appearance: Normal appearance. She is well-developed.  Pulmonary:     Effort: Pulmonary effort is normal.  Abdominal:     General: Abdomen is flat.     Palpations: Abdomen is soft.  Genitourinary:    General: Normal vulva.     Vagina: No vaginal discharge, erythema,  bleeding or lesions.     Cervix: Normal. No discharge, friability, lesion or erythema.     Uterus: Normal.      Adnexa: Right adnexa normal and left adnexa normal.  Neurological:     Mental Status: She is alert.  Psychiatric:        Mood and Affect: Mood normal.        Thought Content: Thought content normal.        Judgment: Judgment normal.       Darice Hoit, [name] present for exam  Assessment:/Plan:   1. Dysuria (Primary) - Urinalysis,Complete w/RFL Culture  2. Screening for STDs (sexually transmitted diseases) - SURESWAB CT/NG/T. vaginalis - HIV antibody (with reflex) - RPR - Hepatitis C antibody  3. Vaginal burning - SureSwab Mycoplasma/Ureaplasma, PCR    Will contact patient with results of testing completed today. Avoid intercourse until symptoms are resolved. Safe sex encouraged. Avoid the use of soaps or perfumed products in the peri area. Avoid tub baths and sitting in sweaty or wet clothing for prolonged periods of time.     Thu Baggett B, NP 10:41 AM

## 2024-04-27 ENCOUNTER — Ambulatory Visit: Admitting: Physical Therapy

## 2024-04-27 ENCOUNTER — Encounter: Payer: Self-pay | Admitting: Physical Therapy

## 2024-04-27 DIAGNOSIS — R252 Cramp and spasm: Secondary | ICD-10-CM

## 2024-04-27 DIAGNOSIS — M5459 Other low back pain: Secondary | ICD-10-CM | POA: Diagnosis not present

## 2024-04-27 DIAGNOSIS — R262 Difficulty in walking, not elsewhere classified: Secondary | ICD-10-CM

## 2024-04-27 DIAGNOSIS — M6281 Muscle weakness (generalized): Secondary | ICD-10-CM

## 2024-04-27 DIAGNOSIS — R293 Abnormal posture: Secondary | ICD-10-CM

## 2024-04-27 LAB — HIV ANTIBODY (ROUTINE TESTING W REFLEX): HIV 1&2 Ab, 4th Generation: NONREACTIVE

## 2024-04-27 LAB — RPR: RPR Ser Ql: NONREACTIVE

## 2024-04-27 LAB — HEPATITIS C ANTIBODY: Hepatitis C Ab: NONREACTIVE

## 2024-04-27 NOTE — Therapy (Signed)
 OUTPATIENT PHYSICAL THERAPY THORACOLUMBAR TREATMENT   Patient Name: Mary Wyatt MRN: [id number] DOB:[birth year], 38 y.o., female Today's Date: 04/27/2024  END OF SESSION:  PT End of Session - 04/27/24 1710     Visit Number 6    Number of Visits 10    Date for PT Re-Evaluation 05/30/24    Authorization Type healthy blue - authorized for 4 more visits through 05/26/24 - need to update auth request on 05/10/24 appt if Pt attended all visits after 04/27/24    Authorization Time Period 4 visits    Authorization - Visit Number 6    Authorization - Number of Visits 6    Progress Note Due on Visit 9    PT Start Time 1445    PT Stop Time 1530    PT Time Calculation (min) 45 min    Activity Tolerance Patient tolerated treatment well;Patient limited by pain    Behavior During Therapy [facility] for tasks assessed/performed              Past Medical History:  Diagnosis Date   Abdominal pain    Abnormal EKG    Dec 2019 and Jan 2020/ saw cardiologist   Allergy    Altered bowel function    Anemia    Anxiety    Biliary dyskinesia    Celiac disease    Chest tightness    Depression    Dermatitis    Deviated septum    DOE (dyspnea on exertion)    Dysmenorrhea    Endometriosis    Episodic lightheadedness    ETD (Eustachian tube dysfunction), bilateral    Family history of brain aneurysm    Genital warts    GERD (gastroesophageal reflux disease)    Hashimoto's disease    History of meningioma    Hypotension    Insomnia    Interstitial cystitis    Meningioma (HCC)    Mononucleosis    Pelvic pain    Perineal pain    Post-operative nausea and vomiting    Rhinitis    RMS[facility] spotted fever) 2015   S/P resection of meningioma    Seasonal allergic rhinitis    Tendinitis    Ulcer    Unspecified dyspareunia    Urethral syndrome    Vitamin D  deficiency    Past Surgical History:  Procedure Laterality Date   COLONOSCOPY  2013   CRANIOTOMY  09/2018   laproscopic  surgery     2013 and 2019   OVARIAN CYST REMOVAL     TONSILLECTOMY     UPPER GASTROINTESTINAL ENDOSCOPY  2013   Patient Active Problem List   Diagnosis Date Noted   Hypotension - chronic 09/19/2021   Anxiety and depression 06/15/2017   Family history of brain aneurysm 04/19/2017   Rhinitis, allergic 04/20/2014   Generalized anxiety disorder 04/20/2014   Celiac disease 12/15/2012   Interstitial cystitis     PCP: Leila Lucie LABOR, MD  REFERRING PROVIDER: Constantino Bong, MD   REFERRING DIAG: M54.50 (ICD-10-CM) - Low back pain, unspecified  Rationale for Evaluation and Treatment: Rehabilitation  THERAPY DIAG:  Muscle weakness (generalized)  Cramp and spasm  Other low back pain  Difficulty in walking, not elsewhere classified  Abnormal posture  ONSET DATE: years  SUBJECTIVE:  SUBJECTIVE STATEMENT: I think my baseline pain is improving slightly.  I continue to follow a pattern of intiial flare up with and after exercise but the next day I feel a bit better having done it.  My feet get tingly with land stretches and this lasts about an hour after I finish.  My Rt knee feels quivery at night but doesn't interfere with sleep.  PERTINENT HISTORY:  recurrent BV and yeast,anxiety, celiac disease, depression, endometriosis, genital warts, IC, CRANIOTOMY 2019,   PAIN:  04/14/24 Are you having pain? Yes: NPRS scale: 4/10 but worsens with activity, gets down to a 4/10 the day after PT Pain location: mid to low back bil Pain description: spasm in mid back (with standing/activity), low back is constant dull achy Aggravating factors: walking with mild increase; static standing starts to spasm after 5 mins, stairs Relieving factors: mid back with rest; low back is constant  PRECAUTIONS: None  RED  FLAGS: None   WEIGHT BEARING RESTRICTIONS: No  FALLS:  Has patient fallen in last 6 months? No  LIVING ENVIRONMENT: Lives with: lives with their family Lives in: House/apartment Stairs: No Has following equipment at home: None  OCCUPATION: not currently  PLOF: Independent  PATIENT GOALS: to have less pain, wants to be able to stand for 2 hours for better tolerance for working and social activities  NEXT MD VISIT: 04/12/24 MRI scheduled for spine  OBJECTIVE:  Note: Objective measures were completed at Evaluation unless otherwise noted.  DIAGNOSTIC FINDINGS:  Impression: MRI spine lumbar without contrast 09/2024 Small right extra foraminal disc protrusion at L4-5, closely approximating and potentially irritation the exiting right L4 nerve root Central disc protrusion at L5-S1, closely approximating the descending S1 nerve roots without overt neural impingement   Impression:normal findings at thoracic spine with MRI 09/2024  Impression: MRI CERVICAL WITHOUT CONTRAST 09/2024 Small central disc protrusion with uncovertebral spurring at C5-6 with resultant borderline mild spinal stenosis with mild right C6 foraminal narrowing Minimal disc bulging at C4-5 and C6-7 without stenosis or impingement    PATIENT SURVEYS:  04/20/24: Oswestry Score: 21/50 or 42% (unchanged to date) EVAL: Oswestry Score: 21 / 50 or 42 %  COGNITION: Overall cognitive status: Within functional limits for tasks assessed     SENSATION: Reports some pins and needles constant in bil feet (feels like electric current); and reports sometimes has tingling in bil hands but not consistent and not worse with pain   MUSCLE LENGTH: 04/20/24:  Significant restriction of bil quads and hip flexors, adductors, gluteals Lt>Rt - anterior hip tension pulling on lumbar spine Improving HS length bil  Eval: Hamstrings: bil limited by 25% Adductors limited by 25% bil and trigger points present bil as well   POSTURE:  rounded shoulders, increased thoracic kyphosis, and posterior pelvic tilt  PALPATION:  04/20/24: Very tender bil SI joint with superior apsect of glute max atrophy present Tight and tender with trigger points glut med, min, proximal quads, hip flexors  Eval: TTP throughout midline and bil paraspinals in lumbar spine but worse at Lt side; Lt SIJ most tender area, bil piriformis also TTP  LUMBAR ROM:   AROM eval 04/20/24  Flexion Pain with immediate flexion mobility but able to complete and limited by 25% Full flexion fingers to toes with lumbar stretch, no pain  Extension Limited by 25% Limited 25% with pain in lumbar  Right lateral flexion Fingertips to 2 in above knee with pain Fingertips to knees with Lt lumbar pain  Left lateral  flexion Fingertips to 2 in above knee with pain Fingertips to knees with Lt lumbar pain  Right rotation [facility] WNL  Left rotation WFL WNL   (Blank rows = not tested)  LOWER EXTREMITY ROM:      04/20/24   Limited Lt end range ER and hip flexion with pain EVAL: WFL but pain with bil hip flexion, bil hip ER  LOWER EXTREMITY MMT:     04/20/24:   Hip abd 4+/5 but quick to fatigue with repeated testing  Eval: Bil hips grossly 4+/5 except for flexion and abduction 4/5  LUMBAR SPECIAL TESTS:  04/20/24 SLR + bil for LBP > LE pain  EVAL Straight leg raise test: pain bil in low back, Single leg stance test: no pain either side, SI Compression/distraction test: compression improved pain, and FABER test: Negative  FUNCTIONAL TESTS:  EVAL: Functional squat - mild bil knee valgus and reports back pain at full end range of squat   Sit up test - 2/3 GAIT: Distance walked: 250' Assistive device utilized: None Level of assistance: Complete Independence Comments: decreased bil step height, decreased   TREATMENT DATE:  [facility] Adult PT Treatment:                                                DATE: 04/27/24 NuStep L5 x6' PT present to answer questions about symptoms and  HEP Seated neutral posture bil UE 2lb 2-way raises x5 each fwd/lat Seated 2lb hip to opp shoulder x15 each, V-formation x15 Standing trunk twist with cross body punch 2lb x20 alt directions Pt education about standing and seated posture with ribcage over pelvis vs locking out lumbar spine Sit to stand with neutral posture practice upon standing Supine PPT Supine PPT with alt march SL with cue to engage pelvic floor - this cue works best for Pt to find TA and allow for ongoing breathing vs holding breath   [facility] Adult PT Treatment:                                                DATE: 04/25/24 Pt seen for aquatic therapy today.  Treatment took place in water 3.5-4.75 ft in depth at the [facility] pool. Temp of water was 91.  Pt entered/exited the pool via stairs using alternating pattern with hand rail.    *walking forward, back and side stepping in 3.6 ft with unsupported *seated on solid noodle swing like: PPT x 3; hip hiking; rotation. *quad and hip flex using solid noodle *side stepping ue RBHB add/abd x 2 widths->slight side lunge to toleration x 2 widths  - squatted rest period *figure 4 stretch at steps *TrA engagement: cues for abd bracing, RBHB noodle pull down x 10 wide stance then staggered stances increased depth to 3.60ft. *Plank position: mountain climbers; fire hydrants; hip extension  - squatted rest period *noodle wrapped posteriorly across chest: cycling *walking between exercises for recovery.     Pt requires the buoyancy and hydrostatic pressure of water for support, and to offload joints by unweighting joint load by at least 50 % in navel deep water and by at least 75-80% in chest to neck deep water.  Viscosity of the water is needed for resistance of  strengthening. Water current perturbations provides challenge to standing balance requiring increased core activation.  04/20/24 NuStep L4 x 6' PT present to review goals Posterior pelvic tilt Posterior pelvic  tilt with dead bug isometric 2x10, then slow toe taps to table from 90/90 x10 Quadruped TA indraw 5x10 Supine hooklying piriformis stretch (pull knee across) x30 bil Attempted multiple positions to find a tolerated hip flexor/quad stretch but Pt continues to have pain with all efforts - holding on adding this to HEP Progressed HEP to include piriformis stretch and supine core stabilization from today's session  [facility] Adult PT Treatment:                                                DATE: 04/19/24 Pt seen for aquatic therapy today.  Treatment took place in water 3.5-4.75 ft in depth at the [facility] pool. Temp of water was 91.  Pt entered/exited the pool via stairs using alternating pattern with hand rail.    *walking forward, back and side stepping in 3.6 ft with unsupported *seated on solid noodle swing like: PPT x 3; hip hiking (Pt reports feeling really stiff after) *TrA engagement: cues for abd bracing, RBHB noodle pull down x 10 wide stance then staggered stances.  - squatted rest period *noodle wrapped posteriorly across chest: cycling *ue support on corner wall: hip add/abd; hip flex/ext (some LB discomfort with end range) * Farmers carry using RBHB bilaterally then unilaterally *side stepping ue RBHB add/abd x 2 widths->slight side lunge to toleration x 2 widths  - squatted rest period *3 way stretch using noodle: add; hamstring; gastroc , IT band.  *walking between exercises for recovery.     Pt requires the buoyancy and hydrostatic pressure of water for support, and to offload joints by unweighting joint load by at least 50 % in navel deep water and by at least 75-80% in chest to neck deep water.  Viscosity of the water is needed for resistance of strengthening. Water current perturbations provides challenge to standing balance requiring increased core activation.   Examination completed, findings reviewed, pt educated on POC, HEP. Pt motivated to participate in PT  and agreeable to attempt recommendations.   For all possible CPT codes, reference the Planned Interventions line above.     If treatment provided at initial evaluation, no treatment charged due to lack of authorization.       PATIENT EDUCATION:  Education details: [email or web address]  Person educated: Patient Education method: Explanation, Demonstration, Tactile cues, Verbal cues, and Handouts Education comprehension: verbalized understanding, returned demonstration, verbal cues required, tactile cues required, and needs further education  HOME EXERCISE PROGRAM: Access Code: [email or web address] URL: [email or web address] Date: 04/20/2024 Prepared by: Orvil Mahika Vanvoorhis  Exercises - Seated Diaphragmatic Breathing  - 1 x daily - 7 x weekly - 2 sets - 10 reps - Supine Diaphragmatic Breathing  - 1 x daily - 7 x weekly - 2 sets - 10 reps - Supine Lower Trunk Rotation  - 1 x daily - 7 x weekly - 1 sets - 10 reps - 5s holds - Supine Butterfly Groin Stretch  - 1 x daily - 7 x weekly - 3 sets - 30s hold - Supine Single Knee to Chest  - 1 x daily - 7 x weekly - 1 sets - 3 reps - 30s holds - Diaphragmatic Breathing in Child's Pose with  Pelvic Floor Relaxation  - 1 x daily - 7 x weekly - 3 sets - 30s hold - Figure 4 Gluteus Mobilization on Foam Roll  - 1 x daily - 7 x weekly - 1 sets - 10 reps - Beginner Bridge  - 1 x daily - 7 x weekly - 1 sets - 10 reps - Bird Dog  - 1 x daily - 7 x weekly - 1 sets - 10 reps - Standing Diagonal Chop  - 1 x daily - 7 x weekly - 1 sets - 10 reps - clamshell and marching  - 1 x daily - 7 x weekly - 1 sets - 10 reps - Supine Posterior Pelvic Tilt  - 1 x daily - 7 x weekly - 1 sets - 10 reps - 5 hold - Supine 90/90 Alternating Heel Touches with Posterior Pelvic Tilt  - 1 x daily - 7 x weekly - 1 sets - 10 reps - Supine Piriformis Stretch with Foot on Ground  - 1 x daily - 7 x weekly - 1 sets - 2 reps - 20-30 hold - Isometric Dead Bug  - 1 x daily - 7 x weekly - 1 sets - 5  reps - 10 hold - Quadruped Transversus Abdominis Bracing  - 1 x daily - 7 x weekly - 1 sets - 10 reps - 5 hold  ASSESSMENT:  CLINICAL IMPRESSION: Shakeia reports that her overall baseline of pain is slightly diminished since starting PT.  She is quite weak in her core and tends to stand with excess lumbar extension and locks out her lumbar spine.  We discussed a more neutral aligned body position which is hard for her to achieve.  She connects best to her TA through cues for her PF using a Kegel cue.  We finished the session in SL using Kegel cue with Pt feeling for abdominal indraw co-contraction which was most effective position and cue to date.     Trenika is making slow but steady progress toward goals with combination of aquatic and land-based therapies.  She reports that she will have pain during the exercises but that the next day her pain is less intense.  Today she demonstrated improved trunk flexion to full ROM with end range pain, which is much improved from evaluation.  She demonstrates stronger hip engagement of muscles but is quick to fatigue.  She is working on core stabilization but requires significant cueing for proper technique to to engage core without holding breath.   Dejanae has a lot of flexibility restrictions of the LE which are adding undue stress to lumbar spine, hips and pelvis.  We are working on building a stretching/mobility program for her that is tolerable within her pain level.  Her ODI is unchanged since start of PT but given degree of mobility restrictions and weakness present this PT believes it will take more time to create a change with the ODI. We progressed HEP today to include some stretches and core strength.  Pt needs ongoing assessment, cueing and careful progression toward goals and will benefit from extended plan of care to reduce pain, improve strength for tasks such as vacuuming, squatting, lifting and carrying to perform household tasks, and increase standing  tolerance for grocery shopping.    OBJECTIVE IMPAIRMENTS: decreased activity tolerance, decreased coordination, decreased endurance, decreased mobility, difficulty walking, decreased ROM, decreased strength, increased fascial restrictions, impaired perceived functional ability, increased muscle spasms, impaired flexibility, impaired sensation, improper body mechanics, postural dysfunction, and pain.  ACTIVITY LIMITATIONS: carrying, lifting, bending, sitting, standing, squatting, stairs, and locomotion level  PARTICIPATION LIMITATIONS: meal prep, cleaning, laundry, driving, shopping, community activity, and yard work  PERSONAL FACTORS: Fitness, Past/current experiences, and Time since onset of injury/illness/exacerbation are also affecting patient's functional outcome.   REHAB POTENTIAL: Good  CLINICAL DECISION MAKING: Stable/uncomplicated  EVALUATION COMPLEXITY: Low   GOALS: Goals reviewed with patient? Yes  SHORT TERM GOALS: Target date: 05/02/24  Pt to be I with HEP for carry over and continuing recommendations for improved outcomes.   Baseline: Goal status: MET 04/20/24  2.  Pt to report ability to tolerate standing at least 30 mins without worsening of pain for ability to complete vacuuming at home.  Baseline: 5 mins with worsening pain Goal status: ONGOING, 7 MIN, 7/2  3.  Pt to demonstrate ability to complete x10 body weight squats without worsening pain for improved ability to tolerate hip and core strengthening for returning to work outs.  Baseline:  Goal status: INITIAL     LONG TERM GOALS: Target date: 05/30/24  Pt to be I with advanced HEP for carry over and continuing recommendations for improved outcomes.   Baseline:  Goal status: INITIAL  2.  Pt to demonstrate full ROM of spine in all directions without increase in pain for improved tolerance to walking.  Baseline:  Goal status: INITIAL  3.  Pt to report ability to tolerate prolonged standing for 1 hour to  tolerate grocery shopping without increase in pain.  Baseline:  Goal status: INITIAL  4.  Pt to demonstrate ability to complete x10 20# squats for improved hip strength and stability for walking at least 2 hours without increase in pain.  Baseline:  Goal status: INITIAL  5.  Pt to demonstrate improved core strength and pelvic stability to decreased strain at low back by demonstrating ability to standing without sway in single leg stance for at least 15s each.  Baseline:  Goal status: INITIAL  6.  Pt to demonstrate at least 5/5 bil hip strength for improved pelvic stability and functional squats without pain for tolerance to carrying in groceries.  Baseline:  Goal status: INITIAL    PLAN:  PT FREQUENCY: 2x/week  PT DURATION: 8 weeks  PLANNED INTERVENTIONS: 97110-Therapeutic exercises, 97530- Therapeutic activity, 97112- Neuromuscular re-education, 97535- Self Care, [id number]- Manual therapy, [phone]- Aquatic Therapy, [phone]- Electrical stimulation (unattended), [phone]- Electrical stimulation (manual), [id number]- Vasopneumatic device, [id number]- Ultrasound, [id number]- Traction (mechanical), [id number]- Ionotophoresis 4mg /ml Dexamethasone, 79439 (1-2 muscles), 20561 (3+ muscles)- Dry Needling, Patient/Family education, Balance training, Taping, Joint mobilization, Joint manipulation, Spinal manipulation, Spinal mobilization, Scar mobilization, Vestibular training, DME instructions, Cryotherapy, Moist heat, and Biofeedback.  PLAN FOR NEXT SESSION: use pelvic floor Kegel cue to achieve TA indraw - in SL is best for patient, aquatic PT for hip and core strengthening and spinal mobility; land based - for beginner core and hip strengthening  Orvil Fester, PT 04/27/24 5:22 PM  [facility] [address] [place], [place], [address] Phone: [phone]   Fax:  [phone]

## 2024-04-28 ENCOUNTER — Ambulatory Visit: Payer: Self-pay | Admitting: Radiology

## 2024-04-28 LAB — CULTURE INDICATED

## 2024-04-28 LAB — URINALYSIS, COMPLETE W/RFL CULTURE
Bilirubin Urine: NEGATIVE
Glucose, UA: NEGATIVE
Hyaline Cast: NONE SEEN /LPF
Ketones, ur: NEGATIVE
Leukocyte Esterase: NEGATIVE
Nitrites, Initial: NEGATIVE
Protein, ur: NEGATIVE
Specific Gravity, Urine: 1.025 (ref 1.001–1.035)
pH: 5.5 (ref 5.0–8.0)

## 2024-04-28 LAB — URINE CULTURE
MICRO NUMBER:: 16670809
SPECIMEN QUALITY:: ADEQUATE

## 2024-04-29 LAB — SURESWAB® ADVANCED BACTERIAL VAGINOSIS (BV), TMA: SURESWAB(R) ADV BACTERIAL VAGINOSIS(BV),TMA: NEGATIVE

## 2024-04-29 LAB — SURESWAB CT/NG/T. VAGINALIS
C. trachomatis RNA, TMA: NOT DETECTED
N. gonorrhoeae RNA, TMA: NOT DETECTED
Trichomonas vaginalis RNA: NOT DETECTED

## 2024-04-30 LAB — SURESWAB MYCOPLASMA/UREAPLASMA, PCR
M. hominis DNA: NOT DETECTED
MYCOPLASMA GENITALIUM, rRNA,TMA: NOT DETECTED
U. parvum DNA: NOT DETECTED
U. urealyticum DNA: NOT DETECTED

## 2024-05-02 ENCOUNTER — Ambulatory Visit (INDEPENDENT_AMBULATORY_CARE_PROVIDER_SITE_OTHER): Admitting: Clinical

## 2024-05-02 DIAGNOSIS — F4322 Adjustment disorder with anxiety: Secondary | ICD-10-CM

## 2024-05-02 NOTE — Progress Notes (Signed)
 Time: 2:00pm-2:57pm CPT Code: 09162E Diagnosis: Adjustment disorder with anxiety  Mary Wyatt was seen in person for individual therapy. She arrived reporting that she felt angry and frustrated following an interaction during which a medical provider had lied to her. Session focused on themes of betrayal in her relationships in recent years. Therapist offered validation and support, and suggested alternate perspectives. She is scheduled to be seen again in one week.  Intake Presenting Problem Mary Wyatt transitioned from therapy with the present therapist to pursue ERP through the platform [email or web address]. She continued OCD-targeted therapy for several years with a different provider. Since that time, she has moved back in with her parents, and has learned more about how her OCD developed. She was also in a two-year relationship during this time. She became concerned that therapy may have become a compulsion that prevented her from engaging with challenges in her life. She ended therapy and ended the relationship shortly thereafter. She lived with her boyfriend's parents from August 2024 for 6 months. She reported that she has lived with her parents for 1 of the past 10 months, after which time she moved back in with her boyfriend. The relationship ended at the end of April, 2025. She had moved out of their condo as of June 2025. Symptoms Anger, anxiety History of Problem  Mary Wyatt reported that living with her ex-boyfriend was especially challenging due to his tendency toward messiness. She has also found the move back in with her parents challenging. Her current struggle relates to having noticed that her parents do not wash their hands after going to the bathroom. Mary Wyatt has been unemployed for a year.  Recent Trigger  Mary Wyatt returned for CBT following a series of major life events. She has pursued ERP for OCD since 2022.  Marital and Family Information  Mary Wyatt has two siblings, one of whom is in [place] and the other is  in [place]. She and her siblings have experienced challenges with their parents related to different world views. She has struggled to set boundaries with her parents.   Present family concerns/problems: Mary Wyatt is in contact with her family and exploring how she might set boundaries with her parents.   Strengths/resources in the family/friends: Mary Wyatt reconnected in recent years with a friend who has a similar background to hers. They get together regularly.    Interpersonal concerns/problems: Mary Wyatt was in her most recent job for over 1.5 years. She described the work environment as highly toxic. She took FMLA for her mental health due to work-related stress. She ultimately left in August 2024, following a conflict with a coworker she had believed was a friend.  General Behavior: WNL Attire: WNL Gait: WNL Motor Activity: WNL Stream of Thought - Productivity: WNL Stream of thought - Progression: WNL Stream of thought - Language:  WNL Emotional tone and reactions - Mood: WNL  Emotional tone and reactions - Affect: WNL Mental trend/Content of thoughts - Perception: WNL  Mental trend/Content of thoughts - Orientation: WNL Mental trend/Content of thoughts - Memory: WNL Mental trend/Content of thoughts - General knowledge: WNL  Insight: WNL Judgment: WNL Intelligence: WNL Mental Status Comment: Client appears oriented to time and space. Overall functioning WNL  Adjustment disorder with anxiety   Treatment Plan Client Abilities/Strengths  Mary Wyatt is reflective, uninhibited  Client Treatment Preferences Mary Wyatt prefers in person sessions. She prefers sessions that fit with her work schedule and are not too early in the morning.  Client Statement of Needs  Mary Wyatt shared that she is seeking an  outside, unattached perspective on her life situation and experiences.  Treatment Level  She would like biweekly sessions.  Symptoms  Anxiety, anger, sleep challenges (she described herself as a  light sleeper whose sleep is easily disrupted) Problems Addressed  Mary Wyatt shared that she has a tendency toward negativity that she would like to address.  Goals 1. Mary Wyatt would like to process her breakup with her ex-boyfriend.  Objective Mary Wyatt would like to better understand the role she wants romantic relationship to play in her life, if any  Target Date: 04/06/2025 Frequency: biweekly  Progress: 0 Modality: Individual therapy  Related Interventions Mary Wyatt will have opportunities to process her experiences in session Therapist will help Mary Wyatt notice and disengage from maladaptive thoughts and behaviors using CBT based strategies Therapist will work with Mary Wyatt to establish and set healthy boundaries in relationship Therapist will provide referrals for additional resources as appropriate   Adjustment disorder with Anxiety         Mary Wyatt [name], PhD               Mary Wyatt L Vermelle Cammarata, PhD

## 2024-05-03 ENCOUNTER — Ambulatory Visit (HOSPITAL_BASED_OUTPATIENT_CLINIC_OR_DEPARTMENT_OTHER): Admitting: Physical Therapy

## 2024-05-03 DIAGNOSIS — R252 Cramp and spasm: Secondary | ICD-10-CM

## 2024-05-03 DIAGNOSIS — M5459 Other low back pain: Secondary | ICD-10-CM

## 2024-05-03 DIAGNOSIS — M6281 Muscle weakness (generalized): Secondary | ICD-10-CM

## 2024-05-03 NOTE — Therapy (Signed)
 OUTPATIENT PHYSICAL THERAPY THORACOLUMBAR TREATMENT   Patient Name: Mary Wyatt MRN: [id number] DOB:08-21-1986, 38 y.o., female Today's Date: 05/03/2024  END OF SESSION:  PT End of Session - 05/03/24 1730     Visit Number 7    Number of Visits 10    Date for PT Re-Evaluation 05/30/24    Authorization Type [employer] - authorized for 4 more visits through 05/26/24 - need to update auth request on 05/10/24 appt if Pt attended all visits after 04/27/24    Authorization - Visit Number 1    Authorization - Number of Visits 4    Progress Note Due on Visit 9    PT Start Time 1405    PT Stop Time 1445    PT Time Calculation (min) 40 min    Activity Tolerance Patient tolerated treatment well    Behavior During Therapy [facility] for tasks assessed/performed               Past Medical History:  Diagnosis Date   Abdominal pain    Abnormal EKG    Dec 2019 and Jan 2020/ saw cardiologist   Allergy    Altered bowel function    Anemia    Anxiety    Biliary dyskinesia    Celiac disease    Chest tightness    Depression    Dermatitis    Deviated septum    DOE (dyspnea on exertion)    Dysmenorrhea    Endometriosis    Episodic lightheadedness    ETD (Eustachian tube dysfunction), bilateral    Family history of brain aneurysm    Genital warts    GERD (gastroesophageal reflux disease)    Hashimoto's disease    History of meningioma    Hypotension    Insomnia    Interstitial cystitis    Meningioma (HCC)    Mononucleosis    Pelvic pain    Perineal pain    Post-operative nausea and vomiting    Rhinitis    RMSF [facility] spotted fever) 2015   S/P resection of meningioma    Seasonal allergic rhinitis    Tendinitis    Ulcer    Unspecified dyspareunia    Urethral syndrome    Vitamin D  deficiency    Past Surgical History:  Procedure Laterality Date   COLONOSCOPY  2013   CRANIOTOMY  09/2018   laproscopic surgery     2013 and 2019   OVARIAN CYST REMOVAL      TONSILLECTOMY     UPPER GASTROINTESTINAL ENDOSCOPY  2013   Patient Active Problem List   Diagnosis Date Noted   Hypotension - chronic 09/19/2021   Anxiety and depression 06/15/2017   Family history of brain aneurysm 04/19/2017   Rhinitis, allergic 04/20/2014   Generalized anxiety disorder 04/20/2014   Celiac disease 12/15/2012   Interstitial cystitis     PCP: Leila Lucie LABOR, MD  REFERRING PROVIDER: Constantino Bong, MD   REFERRING DIAG: M54.50 (ICD-10-CM) - Low back pain, unspecified  Rationale for Evaluation and Treatment: Rehabilitation  THERAPY DIAG:  Muscle weakness (generalized)  Cramp and spasm  Other low back pain  ONSET DATE: years  SUBJECTIVE:  SUBJECTIVE STATEMENT: Pt reports she received good feedback (tactile) with last session on land.  Yesterday she had the best relief she had in 8 months.   PERTINENT HISTORY:  recurrent BV and yeast,anxiety, celiac disease, depression, endometriosis, genital warts, IC, CRANIOTOMY 2019,   PAIN:  04/14/24 Are you having pain? Yes: NPRS scale: 4/10 Pain location: Rt SI joint Pain description: spasm in mid back (with standing/activity), low back is constant dull achy Aggravating factors: walking with mild increase; static standing starts to spasm after 5 mins, stairs Relieving factors: mid back with rest; low back is constant  PRECAUTIONS: None  RED FLAGS: None   WEIGHT BEARING RESTRICTIONS: No  FALLS:  Has patient fallen in last 6 months? No  LIVING ENVIRONMENT: Lives with: lives with their family Lives in: House/apartment Stairs: No Has following equipment at home: None  OCCUPATION: not currently  PLOF: Independent  PATIENT GOALS: to have less pain, wants to be able to stand for 2 hours for better tolerance for working  and social activities  NEXT MD VISIT: 04/12/24 MRI scheduled for spine  OBJECTIVE:  Note: Objective measures were completed at Evaluation unless otherwise noted.  DIAGNOSTIC FINDINGS:  Impression: MRI spine lumbar without contrast 09/2024 Small right extra foraminal disc protrusion at L4-5, closely approximating and potentially irritation the exiting right L4 nerve root Central disc protrusion at L5-S1, closely approximating the descending S1 nerve roots without overt neural impingement   Impression:normal findings at thoracic spine with MRI 09/2024  Impression: MRI CERVICAL WITHOUT CONTRAST 09/2024 Small central disc protrusion with uncovertebral spurring at C5-6 with resultant borderline mild spinal stenosis with mild right C6 foraminal narrowing Minimal disc bulging at C4-5 and C6-7 without stenosis or impingement    PATIENT SURVEYS:  04/20/24: Oswestry Score: 21/50 or 42% (unchanged to date) EVAL: Oswestry Score: 21 / 50 or 42 %  COGNITION: Overall cognitive status: Within functional limits for tasks assessed     SENSATION: Reports some pins and needles constant in bil feet (feels like electric current); and reports sometimes has tingling in bil hands but not consistent and not worse with pain   MUSCLE LENGTH: 04/20/24:  Significant restriction of bil quads and hip flexors, adductors, gluteals Lt>Rt - anterior hip tension pulling on lumbar spine Improving HS length bil  Eval: Hamstrings: bil limited by 25% Adductors limited by 25% bil and trigger points present bil as well   POSTURE: rounded shoulders, increased thoracic kyphosis, and posterior pelvic tilt  PALPATION:  04/20/24: Very tender bil SI joint with superior apsect of glute max atrophy present Tight and tender with trigger points glut med, min, proximal quads, hip flexors  Eval: TTP throughout midline and bil paraspinals in lumbar spine but worse at Lt side; Lt SIJ most tender area, bil piriformis also  TTP  LUMBAR ROM:   AROM eval 04/20/24  Flexion Pain with immediate flexion mobility but able to complete and limited by 25% Full flexion fingers to toes with lumbar stretch, no pain  Extension Limited by 25% Limited 25% with pain in lumbar  Right lateral flexion Fingertips to 2 in above knee with pain Fingertips to knees with Lt lumbar pain  Left lateral flexion Fingertips to 2 in above knee with pain Fingertips to knees with Lt lumbar pain  Right rotation [facility] WNL  Left rotation WFL WNL   (Blank rows = not tested)  LOWER EXTREMITY ROM:      04/20/24   Limited Lt end range ER and hip flexion with pain  EVAL: WFL but pain with bil hip flexion, bil hip ER  LOWER EXTREMITY MMT:     04/20/24:   Hip abd 4+/5 but quick to fatigue with repeated testing  Eval: Bil hips grossly 4+/5 except for flexion and abduction 4/5  LUMBAR SPECIAL TESTS:  04/20/24 SLR + bil for LBP > LE pain  EVAL Straight leg raise test: pain bil in low back, Single leg stance test: no pain either side, SI Compression/distraction test: compression improved pain, and FABER test: Negative  FUNCTIONAL TESTS:  EVAL: Functional squat - mild bil knee valgus and reports back pain at full end range of squat   Sit up test - 2/3 GAIT: Distance walked: 250' Assistive device utilized: None Level of assistance: Complete Independence Comments: decreased bil step height, decreased   TREATMENT DATE:   [facility] Adult PT Treatment:                                                DATE: 05/03/24 Pt seen for aquatic therapy today.  Treatment took place in water 3.5-4.75 ft in depth at the [facility] pool. Temp of water was 91.  Pt entered/exited the pool via stairs using alternating pattern with hand rail.    *walking forward, backward and side stepping in 3.6 ft with unsupported * side stepping with arm add/abdct with rainbow hand floats  * muscle energy technique in pool for ant rotated R inominate * ball squeeze with STS  from bench in water x 12 * straddling noodle: cycling  *noodle stomp x5 each * squats with both feet on blue noodle x 5    [facility] Adult PT Treatment:                                                DATE: 04/27/24 NuStep L5 x6' PT present to answer questions about symptoms and HEP Seated neutral posture bil UE 2lb 2-way raises x5 each fwd/lat Seated 2lb hip to opp shoulder x15 each, V-formation x15 Standing trunk twist with cross body punch 2lb x20 alt directions Pt education about standing and seated posture with ribcage over pelvis vs locking out lumbar spine Sit to stand with neutral posture practice upon standing Supine PPT Supine PPT with alt march SL with cue to engage pelvic floor - this cue works best for Pt to find TA and allow for ongoing breathing vs holding breath   [facility] Adult PT Treatment:                                                DATE: 04/25/24 Pt seen for aquatic therapy today.  Treatment took place in water 3.5-4.75 ft in depth at the [facility] pool. Temp of water was 91.  Pt entered/exited the pool via stairs using alternating pattern with hand rail.    *walking forward, back and side stepping in 3.6 ft with unsupported *seated on solid noodle swing like: PPT x 3; hip hiking; rotation. *quad and hip flex using solid noodle *side stepping ue RBHB add/abd x 2 widths->slight side lunge to toleration x 2 widths  -  squatted rest period *figure 4 stretch at steps *TrA engagement: cues for abd bracing, RBHB noodle pull down x 10 wide stance then staggered stances increased depth to 3.32ft. *Plank position: mountain climbers; fire hydrants; hip extension  - squatted rest period *noodle wrapped posteriorly across chest: cycling *walking between exercises for recovery.     Pt requires the buoyancy and hydrostatic pressure of water for support, and to offload joints by unweighting joint load by at least 50 % in navel deep water and by at least 75-80% in chest to neck  deep water.  Viscosity of the water is needed for resistance of strengthening. Water current perturbations provides challenge to standing balance requiring increased core activation.  04/20/24 NuStep L4 x 6' PT present to review goals Posterior pelvic tilt Posterior pelvic tilt with dead bug isometric 2x10, then slow toe taps to table from 90/90 x10 Quadruped TA indraw 5x10 Supine hooklying piriformis stretch (pull knee across) x30 bil Attempted multiple positions to find a tolerated hip flexor/quad stretch but Pt continues to have pain with all efforts - holding on adding this to HEP Progressed HEP to include piriformis stretch and supine core stabilization from today's session  [facility] Adult PT Treatment:                                                DATE: 04/19/24 Pt seen for aquatic therapy today.  Treatment took place in water 3.5-4.75 ft in depth at the [facility] pool. Temp of water was 91.  Pt entered/exited the pool via stairs using alternating pattern with hand rail.    *walking forward, back and side stepping in 3.6 ft with unsupported *seated on solid noodle swing like: PPT x 3; hip hiking (Pt reports feeling really stiff after) *TrA engagement: cues for abd bracing, RBHB noodle pull down x 10 wide stance then staggered stances.  - squatted rest period *noodle wrapped posteriorly across chest: cycling *ue support on corner wall: hip add/abd; hip flex/ext (some LB discomfort with end range) * Farmers carry using RBHB bilaterally then unilaterally *side stepping ue RBHB add/abd x 2 widths->slight side lunge to toleration x 2 widths  - squatted rest period *3 way stretch using noodle: add; hamstring; gastroc , IT band.  *walking between exercises for recovery.     Pt requires the buoyancy and hydrostatic pressure of water for support, and to offload joints by unweighting joint load by at least 50 % in navel deep water and by at least 75-80% in chest to neck deep water.   Viscosity of the water is needed for resistance of strengthening. Water current perturbations provides challenge to standing balance requiring increased core activation.   Examination completed, findings reviewed, pt educated on POC, HEP. Pt motivated to participate in PT and agreeable to attempt recommendations.   For all possible CPT codes, reference the Planned Interventions line above.     If treatment provided at initial evaluation, no treatment charged due to lack of authorization.       PATIENT EDUCATION:  Education details: [email or web address]  Person educated: Patient Education method: Explanation, Demonstration, Tactile cues, Verbal cues, and Handouts Education comprehension: verbalized understanding, returned demonstration, verbal cues required, tactile cues required, and needs further education  HOME EXERCISE PROGRAM: Access Code: [email or web address] URL: [email or web address] Date: 04/20/2024 Prepared by: Orvil Fester  Exercises - Seated Diaphragmatic Breathing  -  1 x daily - 7 x weekly - 2 sets - 10 reps - Supine Diaphragmatic Breathing  - 1 x daily - 7 x weekly - 2 sets - 10 reps - Supine Lower Trunk Rotation  - 1 x daily - 7 x weekly - 1 sets - 10 reps - 5s holds - Supine Butterfly Groin Stretch  - 1 x daily - 7 x weekly - 3 sets - 30s hold - Supine Single Knee to Chest  - 1 x daily - 7 x weekly - 1 sets - 3 reps - 30s holds - Diaphragmatic Breathing in Child's Pose with Pelvic Floor Relaxation  - 1 x daily - 7 x weekly - 3 sets - 30s hold - Figure 4 Gluteus Mobilization on Foam Roll  - 1 x daily - 7 x weekly - 1 sets - 10 reps - Beginner Bridge  - 1 x daily - 7 x weekly - 1 sets - 10 reps - Bird Dog  - 1 x daily - 7 x weekly - 1 sets - 10 reps - Standing Diagonal Chop  - 1 x daily - 7 x weekly - 1 sets - 10 reps - clamshell and marching  - 1 x daily - 7 x weekly - 1 sets - 10 reps - Supine Posterior Pelvic Tilt  - 1 x daily - 7 x weekly - 1 sets - 10 reps - 5 hold -  Supine 90/90 Alternating Heel Touches with Posterior Pelvic Tilt  - 1 x daily - 7 x weekly - 1 sets - 10 reps - Supine Piriformis Stretch with Foot on Ground  - 1 x daily - 7 x weekly - 1 sets - 2 reps - 20-30 hold - Isometric Dead Bug  - 1 x daily - 7 x weekly - 1 sets - 5 reps - 10 hold - Quadruped Transversus Abdominis Bracing  - 1 x daily - 7 x weekly - 1 sets - 10 reps - 5 hold  ASSESSMENT:  CLINICAL IMPRESSION: Pt tolerated session well. Reported reduction of Rt SI pain after completion of muscle energy technique in the water.  Applied the less is more to work out, avoiding over doing it.  Plan to issue aquatic HEP (via mail) for pt to use should she gain access to a community pool.     Pt needs ongoing assessment, cueing and careful progression toward goals and will benefit from extended plan of care to reduce pain, improve strength for tasks such as vacuuming, squatting, lifting and carrying to perform household tasks, and increase standing tolerance for grocery shopping.    OBJECTIVE IMPAIRMENTS: decreased activity tolerance, decreased coordination, decreased endurance, decreased mobility, difficulty walking, decreased ROM, decreased strength, increased fascial restrictions, impaired perceived functional ability, increased muscle spasms, impaired flexibility, impaired sensation, improper body mechanics, postural dysfunction, and pain.   ACTIVITY LIMITATIONS: carrying, lifting, bending, sitting, standing, squatting, stairs, and locomotion level  PARTICIPATION LIMITATIONS: meal prep, cleaning, laundry, driving, shopping, community activity, and yard work  PERSONAL FACTORS: Fitness, Past/current experiences, and Time since onset of injury/illness/exacerbation are also affecting patient's functional outcome.   REHAB POTENTIAL: Good  CLINICAL DECISION MAKING: Stable/uncomplicated  EVALUATION COMPLEXITY: Low   GOALS: Goals reviewed with patient? Yes  SHORT TERM GOALS: Target  date: 05/02/24  Pt to be I with HEP for carry over and continuing recommendations for improved outcomes.   Baseline: Goal status: MET 04/20/24  2.  Pt to report ability to tolerate standing at least 30 mins without  worsening of pain for ability to complete vacuuming at home.  Baseline: 5 mins with worsening pain Goal status: ONGOING, 7 MIN, 7/2  3.  Pt to demonstrate ability to complete x10 body weight squats without worsening pain for improved ability to tolerate hip and core strengthening for returning to work outs.  Baseline:  Goal status: INITIAL     LONG TERM GOALS: Target date: 05/30/24  Pt to be I with advanced HEP for carry over and continuing recommendations for improved outcomes.   Baseline:  Goal status: INITIAL  2.  Pt to demonstrate full ROM of spine in all directions without increase in pain for improved tolerance to walking.  Baseline:  Goal status: INITIAL  3.  Pt to report ability to tolerate prolonged standing for 1 hour to tolerate grocery shopping without increase in pain.  Baseline:  Goal status: INITIAL  4.  Pt to demonstrate ability to complete x10 20# squats for improved hip strength and stability for walking at least 2 hours without increase in pain.  Baseline:  Goal status: INITIAL  5.  Pt to demonstrate improved core strength and pelvic stability to decreased strain at low back by demonstrating ability to standing without sway in single leg stance for at least 15s each.  Baseline:  Goal status: INITIAL  6.  Pt to demonstrate at least 5/5 bil hip strength for improved pelvic stability and functional squats without pain for tolerance to carrying in groceries.  Baseline:  Goal status: INITIAL    PLAN:  PT FREQUENCY: 2x/week  PT DURATION: 8 weeks  PLANNED INTERVENTIONS: 97110-Therapeutic exercises, 97530- Therapeutic activity, 97112- Neuromuscular re-education, 97535- Self Care, 02859- Manual therapy, [phone]- Aquatic Therapy, [phone]- Electrical  stimulation (unattended), [phone]- Electrical stimulation (manual), [id number]- Vasopneumatic device, [id number]- Ultrasound, [id number]- Traction (mechanical), [id number]- Ionotophoresis 4mg /ml Dexamethasone, [id number] (1-2 muscles), 20561 (3+ muscles)- Dry Needling, Patient/Family education, Balance training, Taping, Joint mobilization, Joint manipulation, Spinal manipulation, Spinal mobilization, Scar mobilization, Vestibular training, DME instructions, Cryotherapy, Moist heat, and Biofeedback.  PLAN FOR NEXT SESSION: use pelvic floor Kegel cue to achieve TA indraw - in SL is best for patient, aquatic PT for hip and core strengthening and spinal mobility; land based - for beginner core and hip strengthening    Delon Aquas, PTA 05/03/24 5:40 PM [facility] [address] [place], [place], [address] Phone: [phone]   Fax:  [phone]

## 2024-05-05 ENCOUNTER — Ambulatory Visit

## 2024-05-05 DIAGNOSIS — M6281 Muscle weakness (generalized): Secondary | ICD-10-CM

## 2024-05-05 DIAGNOSIS — M5459 Other low back pain: Secondary | ICD-10-CM

## 2024-05-05 DIAGNOSIS — R252 Cramp and spasm: Secondary | ICD-10-CM

## 2024-05-05 NOTE — Therapy (Signed)
 OUTPATIENT PHYSICAL THERAPY THORACOLUMBAR TREATMENT   Patient Name: Mary Wyatt MRN: [id number] DOB:[birth year], 38 y.o., female Today's Date: 05/05/2024  END OF SESSION:  PT End of Session - 05/05/24 1317     Visit Number 8    Date for PT Re-Evaluation 05/30/24    Authorization Type healthy blue - authorized for 4 more visits through 05/26/24 - need to update auth request on 05/10/24 appt if Pt attended all visits after 04/27/24    Authorization - Visit Number 2    Authorization - Number of Visits 4    PT Start Time 1236    PT Stop Time 1317    PT Time Calculation (min) 41 min    Activity Tolerance Patient tolerated treatment well    Behavior During Therapy [facility] for tasks assessed/performed                Past Medical History:  Diagnosis Date   Abdominal pain    Abnormal EKG    Dec 2019 and Jan 2020/ saw cardiologist   Allergy    Altered bowel function    Anemia    Anxiety    Biliary dyskinesia    Celiac disease    Chest tightness    Depression    Dermatitis    Deviated septum    DOE (dyspnea on exertion)    Dysmenorrhea    Endometriosis    Episodic lightheadedness    ETD (Eustachian tube dysfunction), bilateral    Family history of brain aneurysm    Genital warts    GERD (gastroesophageal reflux disease)    Hashimoto's disease    History of meningioma    Hypotension    Insomnia    Interstitial cystitis    Meningioma (HCC)    Mononucleosis    Pelvic pain    Perineal pain    Post-operative nausea and vomiting    Rhinitis    [facility] spotted fever) 2015   S/P resection of meningioma    Seasonal allergic rhinitis    Tendinitis    Ulcer    Unspecified dyspareunia    Urethral syndrome    Vitamin D  deficiency    Past Surgical History:  Procedure Laterality Date   COLONOSCOPY  2013   CRANIOTOMY  09/2018   laproscopic surgery     2013 and 2019   OVARIAN CYST REMOVAL     TONSILLECTOMY     UPPER GASTROINTESTINAL ENDOSCOPY  2013    Patient Active Problem List   Diagnosis Date Noted   Hypotension - chronic 09/19/2021   Anxiety and depression 06/15/2017   Family history of brain aneurysm 04/19/2017   Rhinitis, allergic 04/20/2014   Generalized anxiety disorder 04/20/2014   Celiac disease 12/15/2012   Interstitial cystitis     PCP: Leila Lucie LABOR, MD  REFERRING PROVIDER: Constantino Bong, MD   REFERRING DIAG: M54.50 (ICD-10-CM) - Low back pain, unspecified  Rationale for Evaluation and Treatment: Rehabilitation  THERAPY DIAG:  Muscle weakness (generalized)  Cramp and spasm  Other low back pain  ONSET DATE: years  SUBJECTIVE:  SUBJECTIVE STATEMENT: I really love the pool and I am going to try to find a pool that I can use.  I felt really good after last session.  My back is feeling better overall and I've had 3-4/10 pain the past few days.    PERTINENT HISTORY:  recurrent BV and yeast,anxiety, celiac disease, depression, endometriosis, genital warts, IC, CRANIOTOMY 2019,   PAIN:  05/05/24 Are you having pain? Yes: NPRS scale: 4/10 Pain location: Rt SI joint Pain description: spasm in mid back (with standing/activity), low back is constant dull achy Aggravating factors: walking with mild increase; static standing starts to spasm after 5 mins, stairs Relieving factors: mid back with rest; low back is constant  PRECAUTIONS: None  RED FLAGS: None   WEIGHT BEARING RESTRICTIONS: No  FALLS:  Has patient fallen in last 6 months? No  LIVING ENVIRONMENT: Lives with: lives with their family Lives in: House/apartment Stairs: No Has following equipment at home: None  OCCUPATION: not currently  PLOF: Independent  PATIENT GOALS: to have less pain, wants to be able to stand for 2 hours for better tolerance for  working and social activities  NEXT MD VISIT: 04/12/24 MRI scheduled for spine  OBJECTIVE:  Note: Objective measures were completed at Evaluation unless otherwise noted.  DIAGNOSTIC FINDINGS:  Impression: MRI spine lumbar without contrast 09/2024 Small right extra foraminal disc protrusion at L4-5, closely approximating and potentially irritation the exiting right L4 nerve root Central disc protrusion at L5-S1, closely approximating the descending S1 nerve roots without overt neural impingement   Impression:normal findings at thoracic spine with MRI 09/2024  Impression: MRI CERVICAL WITHOUT CONTRAST 09/2024 Small central disc protrusion with uncovertebral spurring at C5-6 with resultant borderline mild spinal stenosis with mild right C6 foraminal narrowing Minimal disc bulging at C4-5 and C6-7 without stenosis or impingement    PATIENT SURVEYS:  04/20/24: Oswestry Score: 21/50 or 42% (unchanged to date) EVAL: Oswestry Score: 21 / 50 or 42 %  COGNITION: Overall cognitive status: Within functional limits for tasks assessed     SENSATION: Reports some pins and needles constant in bil feet (feels like electric current); and reports sometimes has tingling in bil hands but not consistent and not worse with pain   MUSCLE LENGTH: 04/20/24:  Significant restriction of bil quads and hip flexors, adductors, gluteals Lt>Rt - anterior hip tension pulling on lumbar spine Improving HS length bil  Eval: Hamstrings: bil limited by 25% Adductors limited by 25% bil and trigger points present bil as well   POSTURE: rounded shoulders, increased thoracic kyphosis, and posterior pelvic tilt  PALPATION:  04/20/24: Very tender bil SI joint with superior apsect of glute max atrophy present Tight and tender with trigger points glut med, min, proximal quads, hip flexors  Eval: TTP throughout midline and bil paraspinals in lumbar spine but worse at Lt side; Lt SIJ most tender area, bil piriformis also  TTP  LUMBAR ROM:   AROM eval 04/20/24  Flexion Pain with immediate flexion mobility but able to complete and limited by 25% Full flexion fingers to toes with lumbar stretch, no pain  Extension Limited by 25% Limited 25% with pain in lumbar  Right lateral flexion Fingertips to 2 in above knee with pain Fingertips to knees with Lt lumbar pain  Left lateral flexion Fingertips to 2 in above knee with pain Fingertips to knees with Lt lumbar pain  Right rotation WFL WNL  Left rotation WFL WNL   (Blank rows = not tested)  LOWER  EXTREMITY ROM:      04/20/24   Limited Lt end range ER and hip flexion with pain EVAL: WFL but pain with bil hip flexion, bil hip ER  LOWER EXTREMITY MMT:     04/20/24:   Hip abd 4+/5 but quick to fatigue with repeated testing  Eval: Bil hips grossly 4+/5 except for flexion and abduction 4/5  LUMBAR SPECIAL TESTS:  04/20/24 SLR + bil for LBP > LE pain  EVAL Straight leg raise test: pain bil in low back, Single leg stance test: no pain either side, SI Compression/distraction test: compression improved pain, and FABER test: Negative  FUNCTIONAL TESTS:  EVAL: Functional squat - mild bil knee valgus and reports back pain at full end range of squat   Sit up test - 2/3 GAIT: Distance walked: 250' Assistive device utilized: None Level of assistance: Complete Independence Comments: decreased bil step height, decreased   TREATMENT DATE:  [facility] Adult PT Treatment:                                                DATE: 05/05/24 NuStep L5 x6' PT present to monitor Seated hamstring stretch 2x20 seconds bil each  Seated 2lb hip to opp shoulder x15 each, V-formation x15 Standing trunk twist with cross body punch 2lb x20 alt directions Continued education regarding neutral standing posture to reduce lumbar extension Sit to stand with neutral posture practice upon standing Supine PPT 5 hold x15  TA activation with ball squeeze: 5 hold x10 Seated press into ball- opposite  arm to opposite knee x10  Open book x10 bil each Supine PPT with alt march SL with cue to engage pelvic floor - 5 hold x5 Sidelyling clam with TA activation  [facility] Adult PT Treatment:                                                DATE: 05/03/24 Pt seen for aquatic therapy today.  Treatment took place in water 3.5-4.75 ft in depth at the [facility] pool. Temp of water was 91.  Pt entered/exited the pool via stairs using alternating pattern with hand rail.    *walking forward, backward and side stepping in 3.6 ft with unsupported * side stepping with arm add/abdct with rainbow hand floats  * muscle energy technique in pool for ant rotated R inominate * ball squeeze with STS from bench in water x 12 * straddling noodle: cycling  *noodle stomp x5 each * squats with both feet on blue noodle x 5    [facility] Adult PT Treatment:                                                DATE: 04/27/24 NuStep L5 x6' PT present to answer questions about symptoms and HEP Seated neutral posture bil UE 2lb 2-way raises x5 each fwd/lat Seated 2lb hip to opp shoulder x15 each, V-formation x15 Standing trunk twist with cross body punch 2lb x20 alt directions Pt education about standing and seated posture with ribcage over pelvis vs locking out lumbar spine Sit to stand with neutral  posture practice upon standing Supine PPT Supine PPT with alt march SL with cue to engage pelvic floor - this cue works best for Pt to find TA and allow for ongoing breathing vs holding breath    PATIENT EDUCATION:  Education details: [email or web address]  Person educated: Patient Education method: Explanation, Demonstration, Tactile cues, Verbal cues, and Handouts Education comprehension: verbalized understanding, returned demonstration, verbal cues required, tactile cues required, and needs further education  HOME EXERCISE PROGRAM: Access Code: [email or web address] URL: [email or web address] Date: 04/20/2024 Prepared by:  Orvil Beuhring  Exercises - Seated Diaphragmatic Breathing  - 1 x daily - 7 x weekly - 2 sets - 10 reps - Supine Diaphragmatic Breathing  - 1 x daily - 7 x weekly - 2 sets - 10 reps - Supine Lower Trunk Rotation  - 1 x daily - 7 x weekly - 1 sets - 10 reps - 5s holds - Supine Butterfly Groin Stretch  - 1 x daily - 7 x weekly - 3 sets - 30s hold - Supine Single Knee to Chest  - 1 x daily - 7 x weekly - 1 sets - 3 reps - 30s holds - Diaphragmatic Breathing in Child's Pose with Pelvic Floor Relaxation  - 1 x daily - 7 x weekly - 3 sets - 30s hold - Figure 4 Gluteus Mobilization on Foam Roll  - 1 x daily - 7 x weekly - 1 sets - 10 reps - Beginner Bridge  - 1 x daily - 7 x weekly - 1 sets - 10 reps - Bird Dog  - 1 x daily - 7 x weekly - 1 sets - 10 reps - Standing Diagonal Chop  - 1 x daily - 7 x weekly - 1 sets - 10 reps - clamshell and marching  - 1 x daily - 7 x weekly - 1 sets - 10 reps - Supine Posterior Pelvic Tilt  - 1 x daily - 7 x weekly - 1 sets - 10 reps - 5 hold - Supine 90/90 Alternating Heel Touches with Posterior Pelvic Tilt  - 1 x daily - 7 x weekly - 1 sets - 10 reps - Supine Piriformis Stretch with Foot on Ground  - 1 x daily - 7 x weekly - 1 sets - 2 reps - 20-30 hold - Isometric Dead Bug  - 1 x daily - 7 x weekly - 1 sets - 5 reps - 10 hold - Quadruped Transversus Abdominis Bracing  - 1 x daily - 7 x weekly - 1 sets - 10 reps - 5 hold  ASSESSMENT:  CLINICAL IMPRESSION: Pt requested further education regarding neutral standing posture and PT provided verbal and tactile cues.  Pt was challenged with TA activation in supine and this was more achievable in sidelying.  Tactile and verbal cues provided and pt was challenged after multiple attempts.  She will continue to work on this at home.  Pt has more tolerance for standing tasks, particularly with housework.  She was able to perform squats x 10 without increased LBP.  Pt needs ongoing assessment, cueing and careful progression  toward goals and will benefit from extended plan of care to reduce pain, improve strength for tasks such as vacuuming, squatting, lifting and carrying to perform household tasks, and increase standing tolerance for grocery shopping.    OBJECTIVE IMPAIRMENTS: decreased activity tolerance, decreased coordination, decreased endurance, decreased mobility, difficulty walking, decreased ROM, decreased strength, increased fascial restrictions, impaired perceived functional ability, increased muscle spasms, impaired flexibility, impaired sensation,  improper body mechanics, postural dysfunction, and pain.   ACTIVITY LIMITATIONS: carrying, lifting, bending, sitting, standing, squatting, stairs, and locomotion level  PARTICIPATION LIMITATIONS: meal prep, cleaning, laundry, driving, shopping, community activity, and yard work  PERSONAL FACTORS: Fitness, Past/current experiences, and Time since onset of injury/illness/exacerbation are also affecting patient's functional outcome.   REHAB POTENTIAL: Good  CLINICAL DECISION MAKING: Stable/uncomplicated  EVALUATION COMPLEXITY: Low   GOALS: Goals reviewed with patient? Yes  SHORT TERM GOALS: Target date: 05/02/24  Pt to be I with HEP for carry over and continuing recommendations for improved outcomes.   Baseline: Goal status: MET 04/20/24  2.  Pt to report ability to tolerate standing at least 30 mins without worsening of pain for ability to complete vacuuming at home.  Baseline: 7-10 min with stationary activities, able to do things in motion (05/05/24) Goal status: ONGOING  3.  Pt to demonstrate ability to complete x10 body weight squats without worsening pain for improved ability to tolerate hip and core strengthening for returning to work outs.  Baseline: able to squat x 10 without increased pain (05/05/24) Goal status: MET     LONG TERM GOALS: Target date: 05/30/24  Pt to be I with advanced HEP for carry over and continuing recommendations for  improved outcomes.   Baseline:  Goal status: INITIAL  2.  Pt to demonstrate full ROM of spine in all directions without increase in pain for improved tolerance to walking.  Baseline:  Goal status: INITIAL  3.  Pt to report ability to tolerate prolonged standing for 1 hour to tolerate grocery shopping without increase in pain.  Baseline:  Goal status: INITIAL  4.  Pt to demonstrate ability to complete x10 20# squats for improved hip strength and stability for walking at least 2 hours without increase in pain.  Baseline:  Goal status: INITIAL  5.  Pt to demonstrate improved core strength and pelvic stability to decreased strain at low back by demonstrating ability to standing without sway in single leg stance for at least 15s each.  Baseline:  Goal status: INITIAL  6.  Pt to demonstrate at least 5/5 bil hip strength for improved pelvic stability and functional squats without pain for tolerance to carrying in groceries.  Baseline:  Goal status: INITIAL    PLAN:  PT FREQUENCY: 2x/week  PT DURATION: 8 weeks  PLANNED INTERVENTIONS: 97110-Therapeutic exercises, 97530- Therapeutic activity, 97112- Neuromuscular re-education, 97535- Self Care, [id number]- Manual therapy, [phone]- Aquatic Therapy, [phone]- Electrical stimulation (unattended), [phone]- Electrical stimulation (manual), [id number]- Vasopneumatic device, [id number]- Ultrasound, [id number]- Traction (mechanical), [id number]- Ionotophoresis 4mg /ml Dexamethasone, [id number] (1-2 muscles), [id number] (3+ muscles)- Dry Needling, Patient/Family education, Balance training, Taping, Joint mobilization, Joint manipulation, Spinal manipulation, Spinal mobilization, Scar mobilization, Vestibular training, DME instructions, Cryotherapy, Moist heat, and Biofeedback.  PLAN FOR NEXT SESSION: use pelvic floor Kegel cue to achieve TA indraw - in SL is best for patient, continue core strength, endurance and flexibility. Request more visits   Burnard Joy, PT 05/05/24 1:23 PM

## 2024-05-09 NOTE — Progress Notes (Signed)
  Subjective Patient ID: Mary Wyatt is a 38 y.o. female.  Chief Complaint  Patient presents with  . Female Dysuria    Patient states she has been having painful urination and lower abdominal pain since yesterday.   . Abdominal Pain    The following information was reviewed by members of the visit team:  Tobacco  Allergies  Meds  OB Status      38 year old female who presents for evaluation of dysuria which began 1 day ago.  Reports burning sensation in urethra which is particularly worse with urination.  No associated frequency, urgency or hematuria.  No associated vaginal bleeding or discharge.  Reports some lower abdominal pressure as well.  No fever, chills, nausea, vomiting.  Concern for possible UTI    Review of Systems  Constitutional:  Negative for chills and fever.  Gastrointestinal:  Positive for abdominal pain. Negative for abdominal distention, diarrhea, nausea and vomiting.  Genitourinary:  Positive for dysuria. Negative for flank pain, frequency, hematuria and urgency.  Musculoskeletal:  Negative for arthralgias and myalgias.  Skin:  Negative for rash.  Hematological:  Negative for adenopathy. Does not bruise/bleed easily.    Objective Physical Exam Vitals reviewed.  Constitutional:      General: She is not in acute distress.    Appearance: Normal appearance. She is not ill-appearing, toxic-appearing or diaphoretic.   Cardiovascular:     Rate and Rhythm: Normal rate and regular rhythm.     Pulses: Normal pulses.  Pulmonary:     Effort: Pulmonary effort is normal. No respiratory distress.  Abdominal:     General: Abdomen is flat.     Palpations: Abdomen is soft.     Tenderness: There is no right CVA tenderness or left CVA tenderness.   Musculoskeletal:     Cervical back: Normal range of motion.   Skin:    General: Skin is warm and dry.     Findings: No rash.   Neurological:     Mental Status: She is alert and oriented to person,  place, and time.   Psychiatric:        Mood and Affect: Mood normal.     Assessment/Plan 38 year old female with 2-day history of dysuria concern for UTI POC urinalysis shows slight blood, no other abnormality.  Will send urine culture for further evaluation based on symptoms.  POC urine pregnancy test is negative. Vital signs are within normal limits, patient appears to be in no acute distress. Recommend use of Pyridium  as this has been helpful for similar symptoms in the past.  Also recommend follow-up with PCP as needed for persistent symptoms. Urgent Care Disposition:  Home Care   Electronically signed: Franky Floria Vannie DEVONNA 05/09/2024  6:49 PM

## 2024-05-10 ENCOUNTER — Ambulatory Visit: Admitting: Clinical

## 2024-05-10 ENCOUNTER — Ambulatory Visit

## 2024-05-10 DIAGNOSIS — M5459 Other low back pain: Secondary | ICD-10-CM

## 2024-05-10 DIAGNOSIS — R262 Difficulty in walking, not elsewhere classified: Secondary | ICD-10-CM

## 2024-05-10 DIAGNOSIS — M6281 Muscle weakness (generalized): Secondary | ICD-10-CM

## 2024-05-10 NOTE — Therapy (Signed)
 OUTPATIENT PHYSICAL THERAPY THORACOLUMBAR TREATMENT   Patient Name: Mary Wyatt MRN: [id number] DOB:1986-07-08, 38 y.o., female Today's Date: 05/10/2024  END OF SESSION:  PT End of Session - 05/10/24 1325     Visit Number 9    Date for PT Re-Evaluation 05/30/24    Authorization Type healthy [employer] - authorized for 4 more visits through 05/26/24 - need to update auth request on 05/10/24 appt if Pt attended all visits after 04/27/24    Authorization - Visit Number 3    Authorization - Number of Visits 4    PT Start Time 1236    PT Stop Time 1316    PT Time Calculation (min) 40 min    Activity Tolerance Patient tolerated treatment well    Behavior During Therapy [facility] for tasks assessed/performed                 Past Medical History:  Diagnosis Date   Abdominal pain    Abnormal EKG    Dec 2019 and Jan 2020/ saw cardiologist   Allergy    Altered bowel function    Anemia    Anxiety    Biliary dyskinesia    Celiac disease    Chest tightness    Depression    Dermatitis    Deviated septum    DOE (dyspnea on exertion)    Dysmenorrhea    Endometriosis    Episodic lightheadedness    ETD (Eustachian tube dysfunction), bilateral    Family history of brain aneurysm    Genital warts    GERD (gastroesophageal reflux disease)    Hashimoto's disease    History of meningioma    Hypotension    Insomnia    Interstitial cystitis    Meningioma (HCC)    Mononucleosis    Pelvic pain    Perineal pain    Post-operative nausea and vomiting    Rhinitis    [facility] spotted fever) 2015   S/P resection of meningioma    Seasonal allergic rhinitis    Tendinitis    Ulcer    Unspecified dyspareunia    Urethral syndrome    Vitamin D  deficiency    Past Surgical History:  Procedure Laterality Date   COLONOSCOPY  2013   CRANIOTOMY  09/2018   laproscopic surgery     2013 and 2019   OVARIAN CYST REMOVAL     TONSILLECTOMY     UPPER GASTROINTESTINAL ENDOSCOPY  2013    Patient Active Problem List   Diagnosis Date Noted   Hypotension - chronic 09/19/2021   Anxiety and depression 06/15/2017   Family history of brain aneurysm 04/19/2017   Rhinitis, allergic 04/20/2014   Generalized anxiety disorder 04/20/2014   Celiac disease 12/15/2012   Interstitial cystitis     PCP: Leila Lucie LABOR, MD  REFERRING PROVIDER: Constantino Bong, MD   REFERRING DIAG: M54.50 (ICD-10-CM) - Low back pain, unspecified  Rationale for Evaluation and Treatment: Rehabilitation  THERAPY DIAG:  Muscle weakness (generalized)  Other low back pain  Difficulty in walking, not elsewhere classified  ONSET DATE: years  SUBJECTIVE:  SUBJECTIVE STATEMENT: My normal pain went from a 6 to a 4/10.  I am adding in activities that I had been avoiding and that will increase my pain temporarily.   PERTINENT HISTORY:  recurrent BV and yeast,anxiety, celiac disease, depression, endometriosis, genital warts, IC, CRANIOTOMY 2019,   PAIN:  05/05/24 Are you having pain? Yes: NPRS scale: 4/10 Pain location: Rt SI joint Pain description: spasm in mid back (with standing/activity), low back is constant dull achy Aggravating factors: walking with mild increase; static standing starts to spasm after 5 mins, stairs Relieving factors: mid back with rest; low back is constant  PRECAUTIONS: None  RED FLAGS: None   WEIGHT BEARING RESTRICTIONS: No  FALLS:  Has patient fallen in last 6 months? No  LIVING ENVIRONMENT: Lives with: lives with their family Lives in: House/apartment Stairs: No Has following equipment at home: None  OCCUPATION: not currently  PLOF: Independent  PATIENT GOALS: to have less pain, wants to be able to stand for 2 hours for better tolerance for working and social  activities  NEXT MD VISIT: 04/12/24 MRI scheduled for spine  OBJECTIVE:  Note: Objective measures were completed at Evaluation unless otherwise noted.  DIAGNOSTIC FINDINGS:  Impression: MRI spine lumbar without contrast 09/2024 Small right extra foraminal disc protrusion at L4-5, closely approximating and potentially irritation the exiting right L4 nerve root Central disc protrusion at L5-S1, closely approximating the descending S1 nerve roots without overt neural impingement   Impression:normal findings at thoracic spine with MRI 09/2024  Impression: MRI CERVICAL WITHOUT CONTRAST 09/2024 Small central disc protrusion with uncovertebral spurring at C5-6 with resultant borderline mild spinal stenosis with mild right C6 foraminal narrowing Minimal disc bulging at C4-5 and C6-7 without stenosis or impingement    PATIENT SURVEYS:  04/20/24: Oswestry Score: 21/50 or 42% (unchanged to date) EVAL: Oswestry Score: 21 / 50 or 42 % 05/10/24: Modified Oswestry 20/50=40% disability    COGNITION: Overall cognitive status: Within functional limits for tasks assessed     SENSATION: Reports some pins and needles constant in bil feet (feels like electric current); and reports sometimes has tingling in bil hands but not consistent and not worse with pain   MUSCLE LENGTH: 04/20/24:  Significant restriction of bil quads and hip flexors, adductors, gluteals Lt>Rt - anterior hip tension pulling on lumbar spine Improving HS length bil  Eval: Hamstrings: bil limited by 25% Adductors limited by 25% bil and trigger points present bil as well   POSTURE: rounded shoulders, increased thoracic kyphosis, and posterior pelvic tilt  PALPATION:  04/20/24: Very tender bil SI joint with superior apsect of glute max atrophy present Tight and tender with trigger points glut med, min, proximal quads, hip flexors  Eval: TTP throughout midline and bil paraspinals in lumbar spine but worse at Lt side; Lt SIJ most  tender area, bil piriformis also TTP  LUMBAR ROM:   AROM eval 04/20/24 05/10/24  Flexion Pain with immediate flexion mobility but able to complete and limited by 25% Full flexion fingers to toes with lumbar stretch, no pain   Extension Limited by 25% Limited 25% with pain in lumbar   Right lateral flexion Fingertips to 2 in above knee with pain Fingertips to knees with Lt lumbar pain Full with pain  Left lateral flexion Fingertips to 2 in above knee with pain Fingertips to knees with Lt lumbar pain   Right rotation [facility] WNL Full with pain  Left rotation WFL WNL    (Blank rows = not tested)  LOWER EXTREMITY ROM:      04/20/24   Limited Lt end range ER and hip flexion with pain EVAL: WFL but pain with bil hip flexion, bil hip ER  LOWER EXTREMITY MMT:     04/20/24:   Hip abd 4+/5 but quick to fatigue with repeated testing  Eval: Bil hips grossly 4+/5 except for flexion and abduction 4/5  LUMBAR SPECIAL TESTS:  04/20/24 SLR + bil for LBP > LE pain  EVAL Straight leg raise test: pain bil in low back, Single leg stance test: no pain either side, SI Compression/distraction test: compression improved pain, and FABER test: Negative  FUNCTIONAL TESTS:  EVAL: Functional squat - mild bil knee valgus and reports back pain at full end range of squat   Sit up test - 2/3  Functional squat: 10# added - some LBP and hyperextension of spine Sit up test: 2/3 GAIT: Distance walked: 250' Assistive device utilized: None Level of assistance: Complete Independence Comments: decreased bil step height, decreased   TREATMENT DATE:   [facility] Adult PT Treatment:                                                DATE: 05/10/24 NuStep L5 x6' PT present to monitor Seated hamstring stretch 2x20 seconds bil each  standing 2lb hip to opp shoulder 2x10 each-standing on balance pad Standing trunk twist with cross body punch 2lb x20 alt directions Sit to stand with neutral posture practice upon standing- added 10# for  first set, LBP so no weight 2nd set x 10 Open book x10 bil  TA activation with ball squeeze: 5 hold x10 Seated press into ball- opposite arm to opposite knee x10   [facility] Adult PT Treatment:                                                DATE: 05/05/24 NuStep L5 x6' PT present to monitor Seated hamstring stretch 2x20 seconds bil each  Seated 2lb hip to opp shoulder x15 each, V-formation x15 Standing trunk twist with cross body punch 2lb x20 alt directions Continued education regarding neutral standing posture to reduce lumbar extension Sit to stand with neutral posture practice upon standing Supine PPT 5 hold x15  TA activation with ball squeeze: 5 hold x10 Seated press into ball- opposite arm to opposite knee x10  Open book x10 bil each Supine PPT with alt march SL with cue to engage pelvic floor - 5 hold x5 Sidelyling clam with TA activation  [facility] Adult PT Treatment:                                                DATE: 05/03/24 Pt seen for aquatic therapy today.  Treatment took place in water 3.5-4.75 ft in depth at the [facility] pool. Temp of water was 91.  Pt entered/exited the pool via stairs using alternating pattern with hand rail.    *walking forward, backward and side stepping in 3.6 ft with unsupported * side stepping with arm add/abdct with rainbow hand floats  * muscle energy technique in pool for  ant rotated R inominate * ball squeeze with STS from bench in water x 12 * straddling noodle: cycling  *noodle stomp x5 each * squats with both feet on blue noodle x 5    PATIENT EDUCATION:  Education details: [email or web address]  Person educated: Patient Education method: Explanation, Demonstration, Tactile cues, Verbal cues, and Handouts Education comprehension: verbalized understanding, returned demonstration, verbal cues required, tactile cues required, and needs further education  HOME EXERCISE PROGRAM: Access Code: [email or web address] URL:  [email or web address] Date: 04/20/2024 Prepared by: Orvil Beuhring  Exercises - Seated Diaphragmatic Breathing  - 1 x daily - 7 x weekly - 2 sets - 10 reps - Supine Diaphragmatic Breathing  - 1 x daily - 7 x weekly - 2 sets - 10 reps - Supine Lower Trunk Rotation  - 1 x daily - 7 x weekly - 1 sets - 10 reps - 5s holds - Supine Butterfly Groin Stretch  - 1 x daily - 7 x weekly - 3 sets - 30s hold - Supine Single Knee to Chest  - 1 x daily - 7 x weekly - 1 sets - 3 reps - 30s holds - Diaphragmatic Breathing in Child's Pose with Pelvic Floor Relaxation  - 1 x daily - 7 x weekly - 3 sets - 30s hold - Figure 4 Gluteus Mobilization on Foam Roll  - 1 x daily - 7 x weekly - 1 sets - 10 reps - Beginner Bridge  - 1 x daily - 7 x weekly - 1 sets - 10 reps - Bird Dog  - 1 x daily - 7 x weekly - 1 sets - 10 reps - Standing Diagonal Chop  - 1 x daily - 7 x weekly - 1 sets - 10 reps - clamshell and marching  - 1 x daily - 7 x weekly - 1 sets - 10 reps - Supine Posterior Pelvic Tilt  - 1 x daily - 7 x weekly - 1 sets - 10 reps - 5 hold - Supine 90/90 Alternating Heel Touches with Posterior Pelvic Tilt  - 1 x daily - 7 x weekly - 1 sets - 10 reps - Supine Piriformis Stretch with Foot on Ground  - 1 x daily - 7 x weekly - 1 sets - 2 reps - 20-30 hold - Isometric Dead Bug  - 1 x daily - 7 x weekly - 1 sets - 5 reps - 10 hold - Quadruped Transversus Abdominis Bracing  - 1 x daily - 7 x weekly - 1 sets - 10 reps - 5 hold  ASSESSMENT:  CLINICAL IMPRESSION: Pt reports that her pain in the low back has reduced from 6/10 to 4/10 overall.  She is adding back in activities that she has eliminated and this causes some increased pain.  She demonstrates functional core weakness and is working to establish neutral posture and core engagement with daily tasks to reduce lumbar strain.  Modified Oswestry is improved to 40% disability.  See below for goal status.  Pt needs ongoing assessment, cueing and  careful progression toward goals and will benefit from extended plan of care to reduce pain, improve strength for tasks such as vacuuming, squatting, lifting and carrying to perform household tasks, and increase standing tolerance for grocery shopping.    OBJECTIVE IMPAIRMENTS: decreased activity tolerance, decreased coordination, decreased endurance, decreased mobility, difficulty walking, decreased ROM, decreased strength, increased fascial restrictions, impaired perceived functional ability, increased muscle spasms, impaired flexibility, impaired sensation, improper body mechanics, postural dysfunction, and pain.  ACTIVITY LIMITATIONS: carrying, lifting, bending, sitting, standing, squatting, stairs, and locomotion level  PARTICIPATION LIMITATIONS: meal prep, cleaning, laundry, driving, shopping, community activity, and yard work  PERSONAL FACTORS: Fitness, Past/current experiences, and Time since onset of injury/illness/exacerbation are also affecting patient's functional outcome.   REHAB POTENTIAL: Good  CLINICAL DECISION MAKING: Stable/uncomplicated  EVALUATION COMPLEXITY: Low   GOALS: Goals reviewed with patient? Yes  SHORT TERM GOALS: Target date: 05/02/24  Pt to be I with HEP for carry over and continuing recommendations for improved outcomes.   Baseline: Goal status: MET 04/20/24  2.  Pt to report ability to tolerate standing at least 30 mins without worsening of pain for ability to complete vacuuming at home.  Baseline: 7-10 min with stationary activities, able to do things in motion (05/05/24) Goal status: ONGOING  3.  Pt to demonstrate ability to complete x10 body weight squats without worsening pain for improved ability to tolerate hip and core strengthening for returning to work outs.  Baseline: able to squat x 10 without increased pain (05/05/24) Goal status: MET     LONG TERM GOALS: Target date: 05/30/24  Pt to be I with advanced HEP for carry over and continuing  recommendations for improved outcomes.   Baseline:  Goal status: INITIAL  2.  Pt to demonstrate full ROM of spine in all directions without increase in pain for improved tolerance to walking.  Baseline: full except for Rt rotation.  Pain with Rt rotation and side bending (05/10/24) Goal status: in progress   3.  Pt to report ability to tolerate prolonged standing for 1 hour to tolerate grocery shopping without increase in pain.  Baseline:  Goal status: INITIAL  4.  Pt to demonstrate ability to complete x10 20# squats for improved hip strength and stability for walking at least 2 hours without increase in pain.  Baseline: not walking due to ankle issues, 30 min prior to this (05/10/24) Goal status: INITIAL  5.  Pt to demonstrate improved core strength and pelvic stability to decreased strain at low back by demonstrating ability to standing without sway in single leg stance for at least 15s each.  Baseline:  Goal status: INITIAL  6.  Pt to demonstrate at least 5/5 bil hip strength for improved pelvic stability and functional squats without pain for tolerance to carrying in groceries.  Baseline:  Goal status: INITIAL    PLAN:  PT FREQUENCY: 2x/week  PT DURATION: 8 weeks  PLANNED INTERVENTIONS: 97110-Therapeutic exercises, 97530- Therapeutic activity, 97112- Neuromuscular re-education, 97535- Self Care, [id number]- Manual therapy, [phone]- Aquatic Therapy, [phone]- Electrical stimulation (unattended), [phone]- Electrical stimulation (manual), [id number]- Vasopneumatic device, [id number]- Ultrasound, [id number]- Traction (mechanical), [id number]- Ionotophoresis 4mg /ml Dexamethasone, [id number] (1-2 muscles), [id number] (3+ muscles)- Dry Needling, Patient/Family education, Balance training, Taping, Joint mobilization, Joint manipulation, Spinal manipulation, Spinal mobilization, Scar mobilization, Vestibular training, DME instructions, Cryotherapy, Moist heat, and Biofeedback.  PLAN FOR NEXT SESSION: use pelvic floor Kegel cue to  achieve TA indraw - in SL is best for patient, continue core strength, endurance and flexibility. Requested more visits- check on these. Work on forward [employer], PT 05/10/24 1:26 PM

## 2024-05-12 ENCOUNTER — Ambulatory Visit

## 2024-05-12 DIAGNOSIS — R252 Cramp and spasm: Secondary | ICD-10-CM

## 2024-05-12 DIAGNOSIS — M6281 Muscle weakness (generalized): Secondary | ICD-10-CM

## 2024-05-12 DIAGNOSIS — R262 Difficulty in walking, not elsewhere classified: Secondary | ICD-10-CM

## 2024-05-12 DIAGNOSIS — M5459 Other low back pain: Secondary | ICD-10-CM | POA: Diagnosis not present

## 2024-05-12 DIAGNOSIS — R293 Abnormal posture: Secondary | ICD-10-CM

## 2024-05-12 NOTE — Therapy (Unsigned)
 OUTPATIENT PHYSICAL THERAPY THORACOLUMBAR TREATMENT   Patient Name: Mary Wyatt MRN: [id number] DOB:[birth year], 38 y.o., female Today's Date: 05/13/2024  END OF SESSION:  PT End of Session - 05/12/24 1404     Visit Number 10    Number of Visits 10    Date for PT Re-Evaluation 05/30/24    Authorization Type healthy blue - authorized for 4 more visits through 05/26/24 - need to update auth request on 05/10/24 appt if Pt attended all visits after 04/27/24    Authorization Time Period 10    Authorization - Visit Number 10    Authorization - Number of Visits 10    PT Start Time 1402    PT Stop Time 1445    PT Time Calculation (min) 43 min    Activity Tolerance Patient tolerated treatment well    Behavior During Therapy [facility] for tasks assessed/performed                 Past Medical History:  Diagnosis Date   Abdominal pain    Abnormal EKG    Dec 2019 and Jan 2020/ saw cardiologist   Allergy    Altered bowel function    Anemia    Anxiety    Biliary dyskinesia    Celiac disease    Chest tightness    Depression    Dermatitis    Deviated septum    DOE (dyspnea on exertion)    Dysmenorrhea    Endometriosis    Episodic lightheadedness    ETD (Eustachian tube dysfunction), bilateral    Family history of brain aneurysm    Genital warts    GERD (gastroesophageal reflux disease)    Hashimoto's disease    History of meningioma    Hypotension    Insomnia    Interstitial cystitis    Meningioma (HCC)    Mononucleosis    Pelvic pain    Perineal pain    Post-operative nausea and vomiting    Rhinitis    [facility] spotted fever) 2015   S/P resection of meningioma    Seasonal allergic rhinitis    Tendinitis    Ulcer    Unspecified dyspareunia    Urethral syndrome    Vitamin D  deficiency    Past Surgical History:  Procedure Laterality Date   COLONOSCOPY  2013   CRANIOTOMY  09/2018   laproscopic surgery     2013 and 2019   OVARIAN CYST REMOVAL      TONSILLECTOMY     UPPER GASTROINTESTINAL ENDOSCOPY  2013   Patient Active Problem List   Diagnosis Date Noted   Hypotension - chronic 09/19/2021   Anxiety and depression 06/15/2017   Family history of brain aneurysm 04/19/2017   Rhinitis, allergic 04/20/2014   Generalized anxiety disorder 04/20/2014   Celiac disease 12/15/2012   Interstitial cystitis     PCP: Leila Lucie LABOR, MD  REFERRING PROVIDER: Constantino Bong, MD   REFERRING DIAG: M54.50 (ICD-10-CM) - Low back pain, unspecified  Rationale for Evaluation and Treatment: Rehabilitation  THERAPY DIAG:  Other low back pain  Difficulty in walking, not elsewhere classified  Muscle weakness (generalized)  Cramp and spasm  Abnormal posture  ONSET DATE: years  SUBJECTIVE:  SUBJECTIVE STATEMENT: My pain seems to be moving around.  Its around a 4 today but mostly stiffness.     PERTINENT HISTORY:  recurrent BV and yeast,anxiety, celiac disease, depression, endometriosis, genital warts, IC, CRANIOTOMY 2019,   PAIN:  05/12/24 Are you having pain? Yes: NPRS scale: 4/10 Pain location: Rt SI joint Pain description: spasm in mid back (with standing/activity), low back is constant dull achy Aggravating factors: walking with mild increase; static standing starts to spasm after 5 mins, stairs Relieving factors: mid back with rest; low back is constant  PRECAUTIONS: None  RED FLAGS: None   WEIGHT BEARING RESTRICTIONS: No  FALLS:  Has patient fallen in last 6 months? No  LIVING ENVIRONMENT: Lives with: lives with their family Lives in: House/apartment Stairs: No Has following equipment at home: None  OCCUPATION: not currently  PLOF: Independent  PATIENT GOALS: to have less pain, wants to be able to stand for 2 hours for better  tolerance for working and social activities  NEXT MD VISIT: 04/12/24 MRI scheduled for spine  OBJECTIVE:  Note: Objective measures were completed at Evaluation unless otherwise noted.  DIAGNOSTIC FINDINGS:  Impression: MRI spine lumbar without contrast 09/2024 Small right extra foraminal disc protrusion at L4-5, closely approximating and potentially irritation the exiting right L4 nerve root Central disc protrusion at L5-S1, closely approximating the descending S1 nerve roots without overt neural impingement   Impression:normal findings at thoracic spine with MRI 09/2024  Impression: MRI CERVICAL WITHOUT CONTRAST 09/2024 Small central disc protrusion with uncovertebral spurring at C5-6 with resultant borderline mild spinal stenosis with mild right C6 foraminal narrowing Minimal disc bulging at C4-5 and C6-7 without stenosis or impingement    PATIENT SURVEYS:  04/20/24: Oswestry Score: 21/50 or 42% (unchanged to date) EVAL: Oswestry Score: 21 / 50 or 42 % 05/10/24: Modified Oswestry 20/50=40% disability    COGNITION: Overall cognitive status: Within functional limits for tasks assessed     SENSATION: Reports some pins and needles constant in bil feet (feels like electric current); and reports sometimes has tingling in bil hands but not consistent and not worse with pain   MUSCLE LENGTH: 04/20/24:  Significant restriction of bil quads and hip flexors, adductors, gluteals Lt>Rt - anterior hip tension pulling on lumbar spine Improving HS length bil  Eval: Hamstrings: bil limited by 25% Adductors limited by 25% bil and trigger points present bil as well   POSTURE: rounded shoulders, increased thoracic kyphosis, and posterior pelvic tilt  PALPATION:  04/20/24: Very tender bil SI joint with superior apsect of glute max atrophy present Tight and tender with trigger points glut med, min, proximal quads, hip flexors  Eval: TTP throughout midline and bil paraspinals in lumbar spine  but worse at Lt side; Lt SIJ most tender area, bil piriformis also TTP  LUMBAR ROM:   AROM eval 04/20/24 05/10/24  Flexion Pain with immediate flexion mobility but able to complete and limited by 25% Full flexion fingers to toes with lumbar stretch, no pain   Extension Limited by 25% Limited 25% with pain in lumbar   Right lateral flexion Fingertips to 2 in above knee with pain Fingertips to knees with Lt lumbar pain Full with pain  Left lateral flexion Fingertips to 2 in above knee with pain Fingertips to knees with Lt lumbar pain   Right rotation [facility] WNL Full with pain  Left rotation WFL WNL    (Blank rows = not tested)  LOWER EXTREMITY ROM:      04/20/24  Limited Lt end range ER and hip flexion with pain EVAL: WFL but pain with bil hip flexion, bil hip ER  LOWER EXTREMITY MMT:     04/20/24:   Hip abd 4+/5 but quick to fatigue with repeated testing  Eval: Bil hips grossly 4+/5 except for flexion and abduction 4/5  LUMBAR SPECIAL TESTS:  04/20/24 SLR + bil for LBP > LE pain  EVAL Straight leg raise test: pain bil in low back, Single leg stance test: no pain either side, SI Compression/distraction test: compression improved pain, and FABER test: Negative  FUNCTIONAL TESTS:  EVAL: Functional squat - mild bil knee valgus and reports back pain at full end range of squat   Sit up test - 2/3  Functional squat: 10# added - some LBP and hyperextension of spine Sit up test: 2/3 GAIT: Distance walked: 250' Assistive device utilized: None Level of assistance: Complete Independence Comments: decreased bil step height, decreased   TREATMENT DATE:  [facility] Adult PT Treatment:                                                DATE: 05/12/24 NuStep L5 x6' PT present to discuss symptoms and progress Prone lying x 2 min Prone on elbows x 2 min Prone press (modified to low level) x 10 Seated TA activation x 20 Seated mini sit up with 5 lbs 2 x 10 Seated modified Russian Twist x 20 (10 to each  side) with 5 lbs Seated hip to shoulder slightly reclined with 5 lbs 2 x 10 each side Seated hamstring stretch 2x20 seconds bil each  TA activation with ball squeeze: 5 hold x10 Seated ball press bilateral x 20 Seated press into ball- opposite arm to opposite knee x 10 each side  [facility] Adult PT Treatment:                                                DATE: 05/10/24 NuStep L5 x6' PT present to monitor Seated hamstring stretch 2x20 seconds bil each  standing 2lb hip to opp shoulder 2x10 each-standing on balance pad Standing trunk twist with cross body punch 2lb x20 alt directions Sit to stand with neutral posture practice upon standing- added 10# for first set, LBP so no weight 2nd set x 10 Open book x10 bil  TA activation with ball squeeze: 5 hold x10 Seated press into ball- opposite arm to opposite knee x10   [facility] Adult PT Treatment:                                                DATE: 05/05/24 NuStep L5 x6' PT present to monitor Seated hamstring stretch 2x20 seconds bil each  Seated 2lb hip to opp shoulder x15 each, V-formation x15 Standing trunk twist with cross body punch 2lb x20 alt directions Continued education regarding neutral standing posture to reduce lumbar extension Sit to stand with neutral posture practice upon standing Supine PPT 5 hold x15  TA activation with ball squeeze: 5 hold x10 Seated press into ball- opposite arm to opposite knee x10  Open book x10  bil each Supine PPT with alt march SL with cue to engage pelvic floor - 5 hold x5 Sidelyling clam with TA activation     PATIENT EDUCATION:  Education details: [email or web address]  Person educated: Patient Education method: Explanation, Demonstration, Tactile cues, Verbal cues, and Handouts Education comprehension: verbalized understanding, returned demonstration, verbal cues required, tactile cues required, and needs further education  HOME EXERCISE PROGRAM: Access Code: [email or web address] URL:  [email or web address] Date: 04/20/2024 Prepared by: Orvil Beuhring  Exercises - Seated Diaphragmatic Breathing  - 1 x daily - 7 x weekly - 2 sets - 10 reps - Supine Diaphragmatic Breathing  - 1 x daily - 7 x weekly - 2 sets - 10 reps - Supine Lower Trunk Rotation  - 1 x daily - 7 x weekly - 1 sets - 10 reps - 5s holds - Supine Butterfly Groin Stretch  - 1 x daily - 7 x weekly - 3 sets - 30s hold - Supine Single Knee to Chest  - 1 x daily - 7 x weekly - 1 sets - 3 reps - 30s holds - Diaphragmatic Breathing in Child's Pose with Pelvic Floor Relaxation  - 1 x daily - 7 x weekly - 3 sets - 30s hold - Figure 4 Gluteus Mobilization on Foam Roll  - 1 x daily - 7 x weekly - 1 sets - 10 reps - Beginner Bridge  - 1 x daily - 7 x weekly - 1 sets - 10 reps - Bird Dog  - 1 x daily - 7 x weekly - 1 sets - 10 reps - Standing Diagonal Chop  - 1 x daily - 7 x weekly - 1 sets - 10 reps - clamshell and marching  - 1 x daily - 7 x weekly - 1 sets - 10 reps - Supine Posterior Pelvic Tilt  - 1 x daily - 7 x weekly - 1 sets - 10 reps - 5 hold - Supine 90/90 Alternating Heel Touches with Posterior Pelvic Tilt  - 1 x daily - 7 x weekly - 1 sets - 10 reps - Supine Piriformis Stretch with Foot on Ground  - 1 x daily - 7 x weekly - 1 sets - 2 reps - 20-30 hold - Isometric Dead Bug  - 1 x daily - 7 x weekly - 1 sets - 5 reps - 10 hold - Quadruped Transversus Abdominis Bracing  - 1 x daily - 7 x weekly - 1 sets - 10 reps - 5 hold  ASSESSMENT:  CLINICAL IMPRESSION: Oral is progressing appropriately.  She has experienced some exacerbation of symptoms last week but this seems to have resolved.  She was able to complete several new exercises today without increase in pain.  She demonstrates good control and appropriate form throughout all exercises.  She is well motivated and compliant.  She should continue to do well.  She would benefit from continuing skilled PT to meet goals stated below.    OBJECTIVE  IMPAIRMENTS: decreased activity tolerance, decreased coordination, decreased endurance, decreased mobility, difficulty walking, decreased ROM, decreased strength, increased fascial restrictions, impaired perceived functional ability, increased muscle spasms, impaired flexibility, impaired sensation, improper body mechanics, postural dysfunction, and pain.   ACTIVITY LIMITATIONS: carrying, lifting, bending, sitting, standing, squatting, stairs, and locomotion level  PARTICIPATION LIMITATIONS: meal prep, cleaning, laundry, driving, shopping, community activity, and yard work  PERSONAL FACTORS: Fitness, Past/current experiences, and Time since onset of injury/illness/exacerbation are also affecting patient's functional outcome.   REHAB POTENTIAL: Good  CLINICAL DECISION  MAKING: Stable/uncomplicated  EVALUATION COMPLEXITY: Low   GOALS: Goals reviewed with patient? Yes  SHORT TERM GOALS: Target date: 05/02/24  Pt to be I with HEP for carry over and continuing recommendations for improved outcomes.   Baseline: Goal status: MET 04/20/24  2.  Pt to report ability to tolerate standing at least 30 mins without worsening of pain for ability to complete vacuuming at home.  Baseline: 7-10 min with stationary activities, able to do things in motion (05/05/24) Goal status: ONGOING  3.  Pt to demonstrate ability to complete x10 body weight squats without worsening pain for improved ability to tolerate hip and core strengthening for returning to work outs.  Baseline: able to squat x 10 without increased pain (05/05/24) Goal status: MET     LONG TERM GOALS: Target date: 05/30/24  Pt to be I with advanced HEP for carry over and continuing recommendations for improved outcomes.   Baseline:  Goal status: INITIAL  2.  Pt to demonstrate full ROM of spine in all directions without increase in pain for improved tolerance to walking.  Baseline: full except for Rt rotation.  Pain with Rt rotation and side  bending (05/10/24) Goal status: in progress   3.  Pt to report ability to tolerate prolonged standing for 1 hour to tolerate grocery shopping without increase in pain.  Baseline:  Goal status: INITIAL  4.  Pt to demonstrate ability to complete x10 20# squats for improved hip strength and stability for walking at least 2 hours without increase in pain.  Baseline: not walking due to ankle issues, 30 min prior to this (05/10/24) Goal status: INITIAL  5.  Pt to demonstrate improved core strength and pelvic stability to decreased strain at low back by demonstrating ability to standing without sway in single leg stance for at least 15s each.  Baseline:  Goal status: INITIAL  6.  Pt to demonstrate at least 5/5 bil hip strength for improved pelvic stability and functional squats without pain for tolerance to carrying in groceries.  Baseline:  Goal status: INITIAL    PLAN:  PT FREQUENCY: 2x/week  PT DURATION: 8 weeks  PLANNED INTERVENTIONS: 97110-Therapeutic exercises, 97530- Therapeutic activity, 97112- Neuromuscular re-education, 97535- Self Care, [id number]- Manual therapy, [phone]- Aquatic Therapy, [phone]- Electrical stimulation (unattended), [phone]- Electrical stimulation (manual), [id number]- Vasopneumatic device, [id number]- Ultrasound, [id number]- Traction (mechanical), [id number]- Ionotophoresis 4mg /ml Dexamethasone, [id number] (1-2 muscles), 20561 (3+ muscles)- Dry Needling, Patient/Family education, Balance training, Taping, Joint mobilization, Joint manipulation, Spinal manipulation, Spinal mobilization, Scar mobilization, Vestibular training, DME instructions, Cryotherapy, Moist heat, and Biofeedback.  PLAN FOR NEXT SESSION: Postural and core strengthening, hip stability,  use pelvic floor Kegel cue to achieve TA indraw - in SL is best for patient, continue core strength, endurance and flexibility. Requested more visits- check on these. Work on forward [employer] B. Candelaria Pies, PT 05/13/24 7:24 AM [facility]  Special[facility] Re[facility] [address] [place], [place] [address] Phone # [phone] Fax [phone]

## 2024-05-16 ENCOUNTER — Ambulatory Visit (INDEPENDENT_AMBULATORY_CARE_PROVIDER_SITE_OTHER): Admitting: Clinical

## 2024-05-16 DIAGNOSIS — F4322 Adjustment disorder with anxiety: Secondary | ICD-10-CM | POA: Diagnosis not present

## 2024-05-16 NOTE — Progress Notes (Signed)
 Time: 2:00pm-2:57pm CPT Code: 09162E Diagnosis: Adjustment disorder with anxiety  Mary Wyatt was seen in person for individual therapy. Session focused on processing experiences she has had recently in light of experiences she had in school, especially during which individuals in authority were disengenuous and unqualified. For homework, she will practice noticing if/when she experiences a quiet internal voice during moments of obsessive thinking or behavior. She is scheduled to be seen again in one week.  Intake Presenting Problem Mary Wyatt transitioned from therapy with the present therapist to pursue ERP through the platform [email or web address]. She continued OCD-targeted therapy for several years with a different provider. Since that time, she has moved back in with her parents, and has learned more about how her OCD developed. She was also in a two-year relationship during this time. She became concerned that therapy may have become a compulsion that prevented her from engaging with challenges in her life. She ended therapy and ended the relationship shortly thereafter. She lived with her boyfriend's parents from August 2024 for 6 months. She reported that she has lived with her parents for 1 of the past 10 months, after which time she moved back in with her boyfriend. The relationship ended at the end of April, 2025. She had moved out of their condo as of June 2025. Symptoms Anger, anxiety History of Problem  Mary Wyatt reported that living with her ex-boyfriend was especially challenging due to his tendency toward messiness. She has also found the move back in with her parents challenging. Her current struggle relates to having noticed that her parents do not wash their hands after going to the bathroom. Mary Wyatt has been unemployed for a year.  Recent Trigger  Mary Wyatt returned for CBT following a series of major life events. She has pursued ERP for OCD since 2022.  Marital and Family Information  Mary Wyatt has two siblings,  one of whom is in [place] and the other is in [place]. She and her siblings have experienced challenges with their parents related to different world views. She has struggled to set boundaries with her parents.   Present family concerns/problems: Mary Wyatt is in contact with her family and exploring how she might set boundaries with her parents.   Strengths/resources in the family/friends: Mary Wyatt reconnected in recent years with a friend who has a similar background to hers. They get together regularly.    Interpersonal concerns/problems: Mary Wyatt was in her most recent job for over 1.5 years. She described the work environment as highly toxic. She took FMLA for her mental health due to work-related stress. She ultimately left in August 2024, following a conflict with a coworker she had believed was a friend.  General Behavior: WNL Attire: WNL Gait: WNL Motor Activity: WNL Stream of Thought - Productivity: WNL Stream of thought - Progression: WNL Stream of thought - Language:  WNL Emotional tone and reactions - Mood: WNL  Emotional tone and reactions - Affect: WNL Mental trend/Content of thoughts - Perception: WNL  Mental trend/Content of thoughts - Orientation: WNL Mental trend/Content of thoughts - Memory: WNL Mental trend/Content of thoughts - General knowledge: WNL  Insight: WNL Judgment: WNL Intelligence: WNL Mental Status Comment: Client appears oriented to time and space. Overall functioning WNL  Adjustment disorder with anxiety   Treatment Plan Client Abilities/Strengths  Mary Wyatt is reflective, uninhibited  Client Treatment Preferences Mary Wyatt prefers in person sessions. She prefers sessions that fit with her work schedule and are not too early in the morning.  Client Statement of Needs  Mary Wyatt shared  that she is seeking an outside, unattached perspective on her life situation and experiences.  Treatment Level  She would like biweekly sessions.  Symptoms  Anxiety, anger, sleep  challenges (she described herself as a light sleeper whose sleep is easily disrupted) Problems Addressed  Mary Wyatt shared that she has a tendency toward negativity that she would like to address.  Goals 1. Mary Wyatt would like to process her breakup with her ex-boyfriend.  Objective Mary Wyatt would like to better understand the role she wants romantic relationship to play in her life, if any  Target Date: 04/06/2025 Frequency: biweekly  Progress: 0 Modality: Individual therapy  Related Interventions Mary Wyatt will have opportunities to process her experiences in session Therapist will help Mary Wyatt notice and disengage from maladaptive thoughts and behaviors using CBT based strategies Therapist will work with Mary Wyatt to establish and set healthy boundaries in relationship Therapist will provide referrals for additional resources as appropriate   Adjustment disorder with Anxiety     Mary [name], PhD               Mary Wyatt Tegtmeyer, PhD

## 2024-05-17 ENCOUNTER — Ambulatory Visit

## 2024-05-17 DIAGNOSIS — M6281 Muscle weakness (generalized): Secondary | ICD-10-CM

## 2024-05-17 DIAGNOSIS — M5459 Other low back pain: Secondary | ICD-10-CM | POA: Diagnosis not present

## 2024-05-17 DIAGNOSIS — R252 Cramp and spasm: Secondary | ICD-10-CM

## 2024-05-17 DIAGNOSIS — R293 Abnormal posture: Secondary | ICD-10-CM

## 2024-05-17 DIAGNOSIS — R262 Difficulty in walking, not elsewhere classified: Secondary | ICD-10-CM

## 2024-05-17 DIAGNOSIS — M62838 Other muscle spasm: Secondary | ICD-10-CM

## 2024-05-17 NOTE — Therapy (Signed)
 OUTPATIENT PHYSICAL THERAPY THORACOLUMBAR TREATMENT   Patient Name: Mary Wyatt MRN: [id number] DOB:12/10/1985, 38 y.o., female Today's Date: 05/17/2024  END OF SESSION:  PT End of Session - 05/17/24 1147     Visit Number 11    Date for PT Re-Evaluation 06/03/24    Authorization Type visits-05/16/2024-06/14/2024-auth#[id number]    Authorization Time Period 1    Authorization - Visit Number 4    PT Start Time 1103    PT Stop Time 1148    PT Time Calculation (min) 45 min    Activity Tolerance Patient tolerated treatment well    Behavior During Therapy [facility] for tasks assessed/performed                  Past Medical History:  Diagnosis Date   Abdominal pain    Abnormal EKG    Dec 2019 and Jan 2020/ saw cardiologist   Allergy    Altered bowel function    Anemia    Anxiety    Biliary dyskinesia    Celiac disease    Chest tightness    Depression    Dermatitis    Deviated septum    DOE (dyspnea on exertion)    Dysmenorrhea    Endometriosis    Episodic lightheadedness    ETD (Eustachian tube dysfunction), bilateral    Family history of brain aneurysm    Genital warts    GERD (gastroesophageal reflux disease)    Hashimoto's disease    History of meningioma    Hypotension    Insomnia    Interstitial cystitis    Meningioma (HCC)    Mononucleosis    Pelvic pain    Perineal pain    Post-operative nausea and vomiting    Rhinitis    RMS[facility] spotted fever) 2015   S/P resection of meningioma    Seasonal allergic rhinitis    Tendinitis    Ulcer    Unspecified dyspareunia    Urethral syndrome    Vitamin D  deficiency    Past Surgical History:  Procedure Laterality Date   COLONOSCOPY  2013   CRANIOTOMY  09/2018   laproscopic surgery     2013 and 2019   OVARIAN CYST REMOVAL     TONSILLECTOMY     UPPER GASTROINTESTINAL ENDOSCOPY  2013   Patient Active Problem List   Diagnosis Date Noted   Hypotension - chronic 09/19/2021   Anxiety and  depression 06/15/2017   Family history of brain aneurysm 04/19/2017   Rhinitis, allergic 04/20/2014   Generalized anxiety disorder 04/20/2014   Celiac disease 12/15/2012   Interstitial cystitis     PCP: Leila Lucie LABOR, MD  REFERRING PROVIDER: Constantino Bong, MD   REFERRING DIAG: M54.50 (ICD-10-CM) - Low back pain, unspecified  Rationale for Evaluation and Treatment: Rehabilitation  THERAPY DIAG:  Other low back pain - Plan: PT plan of care cert/re-cert  Difficulty in walking, not elsewhere classified - Plan: PT plan of care cert/re-cert  Muscle weakness (generalized) - Plan: PT plan of care cert/re-cert  Cramp and spasm - Plan: PT plan of care cert/re-cert  Abnormal posture - Plan: PT plan of care cert/re-cert  Other muscle spasm - Plan: PT plan of care cert/re-cert  ONSET DATE: years  SUBJECTIVE:  SUBJECTIVE STATEMENT: Pain isn't too bad.  I'm itchy and had to get a steroid shot.  I should have my MRI results back soon.  I completed all of my imaging finally.    PERTINENT HISTORY:  recurrent BV and yeast,anxiety, celiac disease, depression, endometriosis, genital warts, IC, CRANIOTOMY 2019,   PAIN:  05/12/24 Are you having pain? Yes: NPRS scale: 4/10 Pain location: Rt SI joint Pain description: spasm in mid back (with standing/activity), low back is constant dull achy Aggravating factors: walking with mild increase; static standing starts to spasm after 5 mins, stairs Relieving factors: mid back with rest; low back is constant  PRECAUTIONS: None  RED FLAGS: None   WEIGHT BEARING RESTRICTIONS: No  FALLS:  Has patient fallen in last 6 months? No  LIVING ENVIRONMENT: Lives with: lives with their family Lives in: House/apartment Stairs: No Has following equipment at home:  None  OCCUPATION: not currently  PLOF: Independent  PATIENT GOALS: to have less pain, wants to be able to stand for 2 hours for better tolerance for working and social activities  NEXT MD VISIT: 04/12/24 MRI scheduled for spine  OBJECTIVE:  Note: Objective measures were completed at Evaluation unless otherwise noted.  DIAGNOSTIC FINDINGS:  04/2024 Thoracic spine:  No high-grade canal or foraminal stenosis within the thoracic spine.  2.  No convincing evidence of thoracic spinal cord signal abnormality. There is apparent ill-defined T2 hyperintense signal within the tip of the conus at the level of L2, seen only on the axial T2-weighted images. This may be artifactual in nature, however recommend correlation with evidence of myelopathy.  3.  No abnormal enhancement identified. Impression: MRI spine lumbar without contrast 09/2024 Small right extra foraminal disc protrusion at L4-5, closely approximating and potentially irritation the exiting right L4 nerve root Central disc protrusion at L5-S1, closely approximating the descending S1 nerve roots without overt neural impingement  Cervical:  1.  Mild multilevel cervical spondylosis as described above. No high-grade canal stenosis or foraminal narrowing.  2.  No cervical cord signal abnormality.  3.  No abnormal enhancement identified.    PATIENT SURVEYS:  04/20/24: Oswestry Score: 21/50 or 42% (unchanged to date) EVAL: Oswestry Score: 21 / 50 or 42 % 05/10/24: Modified Oswestry 20/50=40% disability    COGNITION: Overall cognitive status: Within functional limits for tasks assessed     SENSATION: Reports some pins and needles constant in bil feet (feels like electric current); and reports sometimes has tingling in bil hands but not consistent and not worse with pain   MUSCLE LENGTH: 04/20/24:  Significant restriction of bil quads and hip flexors, adductors, gluteals Lt>Rt - anterior hip tension pulling on lumbar spine Improving HS  length bil  Eval: Hamstrings: bil limited by 25% Adductors limited by 25% bil and trigger points present bil as well   POSTURE: rounded shoulders, increased thoracic kyphosis, and posterior pelvic tilt  PALPATION:  04/20/24: Very tender bil SI joint with superior apsect of glute max atrophy present Tight and tender with trigger points glut med, min, proximal quads, hip flexors  Eval: TTP throughout midline and bil paraspinals in lumbar spine but worse at Lt side; Lt SIJ most tender area, bil piriformis also TTP  LUMBAR ROM:   AROM eval 04/20/24 05/10/24  Flexion Pain with immediate flexion mobility but able to complete and limited by 25% Full flexion fingers to toes with lumbar stretch, no pain   Extension Limited by 25% Limited 25% with pain in lumbar   Right lateral  flexion Fingertips to 2 in above knee with pain Fingertips to knees with Lt lumbar pain Full with pain  Left lateral flexion Fingertips to 2 in above knee with pain Fingertips to knees with Lt lumbar pain   Right rotation [facility] WNL Full with pain  Left rotation WFL WNL    (Blank rows = not tested)  LOWER EXTREMITY ROM:      04/20/24   Limited Lt end range ER and hip flexion with pain EVAL: WFL but pain with bil hip flexion, bil hip ER  LOWER EXTREMITY MMT:     04/20/24:   Hip abd 4+/5 but quick to fatigue with repeated testing  Eval: Bil hips grossly 4+/5 except for flexion and abduction 4/5  LUMBAR SPECIAL TESTS:  04/20/24 SLR + bil for LBP > LE pain  EVAL Straight leg raise test: pain bil in low back, Single leg stance test: no pain either side, SI Compression/distraction test: compression improved pain, and FABER test: Negative  FUNCTIONAL TESTS:  EVAL: Functional squat - mild bil knee valgus and reports back pain at full end range of squat   Sit up test - 2/3  Functional squat: 10# added - some LBP and hyperextension of spine Sit up test: 2/3 GAIT: Distance walked: 250' Assistive device utilized:  None Level of assistance: Complete Independence Comments: decreased bil step height, decreased   TREATMENT DATE:   [facility] Adult PT Treatment:                                                DATE: 05/17/24 NuStep L4 x6' PT present to discuss symptoms and progress (blue machine) Prone on elbows x 2 min Prone press (modified to low level) x 10 Prone hip extension with TA activation x15 each Seated mini sit up with 5 lbs 2 x 10 Seated modified Russian Twist x 20 (10 to each side) with 5 lbs Seated hip to shoulder slightly reclined with 5 lbs 2 x 10 each side Seated hamstring stretch 2x20 seconds bil each  TA activation with ball squeeze: 5 hold x10 Seated ball press bilateral x 20 in supine  Standing: Farmer's carry with 5# kettlebell 2x20 alternating marching   [facility] Adult PT Treatment:                                                DATE: 05/12/24 NuStep L5 x6' PT present to discuss symptoms and progress Prone lying x 2 min Prone on elbows x 2 min Prone press (modified to low level) x 10 Seated TA activation x 20 Seated mini sit up with 5 lbs 2 x 10 Seated modified Russian Twist x 20 (10 to each side) with 5 lbs Seated hip to shoulder slightly reclined with 5 lbs 2 x 10 each side Seated hamstring stretch 2x20 seconds bil each  TA activation with ball squeeze: 5 hold x10 Seated ball press bilateral x 20 Seated press into ball- opposite arm to opposite knee x 10 each side  [facility] Adult PT Treatment:  DATE: 05/10/24 NuStep L5 x6' PT present to monitor Seated hamstring stretch 2x20 seconds bil each  standing 2lb hip to opp shoulder 2x10 each-standing on balance pad Standing trunk twist with cross body punch 2lb x20 alt directions Sit to stand with neutral posture practice upon standing- added 10# for first set, LBP so no weight 2nd set x 10 Open book x10 bil  TA activation with ball squeeze: 5 hold x10 Seated press into ball- opposite arm to  opposite knee x10   PATIENT EDUCATION:  Education details: [email or web address]  Person educated: Patient Education method: Explanation, Demonstration, Tactile cues, Verbal cues, and Handouts Education comprehension: verbalized understanding, returned demonstration, verbal cues required, tactile cues required, and needs further education  HOME EXERCISE PROGRAM: Access Code: [email or web address] URL: [email or web address] Date: 04/20/2024 Prepared by: Orvil Beuhring  Exercises - Seated Diaphragmatic Breathing  - 1 x daily - 7 x weekly - 2 sets - 10 reps - Supine Diaphragmatic Breathing  - 1 x daily - 7 x weekly - 2 sets - 10 reps - Supine Lower Trunk Rotation  - 1 x daily - 7 x weekly - 1 sets - 10 reps - 5s holds - Supine Butterfly Groin Stretch  - 1 x daily - 7 x weekly - 3 sets - 30s hold - Supine Single Knee to Chest  - 1 x daily - 7 x weekly - 1 sets - 3 reps - 30s holds - Diaphragmatic Breathing in Child's Pose with Pelvic Floor Relaxation  - 1 x daily - 7 x weekly - 3 sets - 30s hold - Figure 4 Gluteus Mobilization on Foam Roll  - 1 x daily - 7 x weekly - 1 sets - 10 reps - Beginner Bridge  - 1 x daily - 7 x weekly - 1 sets - 10 reps - Bird Dog  - 1 x daily - 7 x weekly - 1 sets - 10 reps - Standing Diagonal Chop  - 1 x daily - 7 x weekly - 1 sets - 10 reps - clamshell and marching  - 1 x daily - 7 x weekly - 1 sets - 10 reps - Supine Posterior Pelvic Tilt  - 1 x daily - 7 x weekly - 1 sets - 10 reps - 5 hold - Supine 90/90 Alternating Heel Touches with Posterior Pelvic Tilt  - 1 x daily - 7 x weekly - 1 sets - 10 reps - Supine Piriformis Stretch with Foot on Ground  - 1 x daily - 7 x weekly - 1 sets - 2 reps - 20-30 hold - Isometric Dead Bug  - 1 x daily - 7 x weekly - 1 sets - 5 reps - 10 hold - Quadruped Transversus Abdominis Bracing  - 1 x daily - 7 x weekly - 1 sets - 10 reps - 5 hold  ASSESSMENT:  CLINICAL IMPRESSION: Pt reports increased ease getting up from a squatted  position.  She is going to rework her schedule to try to improve her consistency and compliance with her HEP and PT emphasized the importance of compliance.   She would benefit from continuing skilled PT to meet goals stated below.    OBJECTIVE IMPAIRMENTS: decreased activity tolerance, decreased coordination, decreased endurance, decreased mobility, difficulty walking, decreased ROM, decreased strength, increased fascial restrictions, impaired perceived functional ability, increased muscle spasms, impaired flexibility, impaired sensation, improper body mechanics, postural dysfunction, and pain.   ACTIVITY LIMITATIONS: carrying, lifting, bending, sitting, standing, squatting, stairs, and locomotion level  PARTICIPATION LIMITATIONS:  meal prep, cleaning, laundry, driving, shopping, community activity, and yard work  PERSONAL FACTORS: Fitness, Past/current experiences, and Time since onset of injury/illness/exacerbation are also affecting patient's functional outcome.   REHAB POTENTIAL: Good  CLINICAL DECISION MAKING: Stable/uncomplicated  EVALUATION COMPLEXITY: Low   GOALS: Goals reviewed with patient? Yes  SHORT TERM GOALS: Target date: 05/02/24  Pt to be I with HEP for carry over and continuing recommendations for improved outcomes.   Baseline: Goal status: MET 04/20/24  2.  Pt to report ability to tolerate standing at least 30 mins without worsening of pain for ability to complete vacuuming at home.  Baseline: 7-10 min with stationary activities, able to do things in motion (05/05/24) Goal status: ONGOING  3.  Pt to demonstrate ability to complete x10 body weight squats without worsening pain for improved ability to tolerate hip and core strengthening for returning to work outs.  Baseline: able to squat x 10 without increased pain (05/05/24) Goal status: MET     LONG TERM GOALS: Target date: 06/03/24  Pt to be I with advanced HEP for carry over and continuing recommendations for  improved outcomes.   Baseline:  Goal status: INITIAL  2.  Pt to demonstrate full ROM of spine in all directions without increase in pain for improved tolerance to walking.  Baseline: full except for Rt rotation.  Pain with Rt rotation and side bending (05/10/24) Goal status: in progress   3.  Pt to report ability to tolerate prolonged standing for 1 hour to tolerate grocery shopping without increase in pain.  Baseline:  Goal status: INITIAL  4.  Pt to demonstrate ability to complete x10 20# squats for improved hip strength and stability for walking at least 2 hours without increase in pain.  Baseline: not walking due to ankle issues, 30 min prior to this (05/10/24) Goal status: INITIAL  5.  Pt to demonstrate improved core strength and pelvic stability to decreased strain at low back by demonstrating ability to standing without sway in single leg stance for at least 15s each.  Baseline:  Goal status: INITIAL  6.  Pt to demonstrate at least 5/5 bil hip strength for improved pelvic stability and functional squats without pain for tolerance to carrying in groceries.  Baseline:  Goal status: INITIAL    PLAN:  PT FREQUENCY: 2x/week  PT DURATION: 8 weeks  PLANNED INTERVENTIONS: 97110-Therapeutic exercises, 97530- Therapeutic activity, 97112- Neuromuscular re-education, 97535- Self Care, [id number]- Manual therapy, [phone]- Aquatic Therapy, [phone]- Electrical stimulation (unattended), [phone]- Electrical stimulation (manual), [id number]- Vasopneumatic device, [id number]- Ultrasound, [id number]- Traction (mechanical), [id number]- Ionotophoresis 4mg /ml Dexamethasone, [id number] (1-2 muscles), [id number] (3+ muscles)- Dry Needling, Patient/Family education, Balance training, Taping, Joint mobilization, Joint manipulation, Spinal manipulation, Spinal mobilization, Scar mobilization, Vestibular training, DME instructions, Cryotherapy, Moist heat, and Biofeedback.  PLAN FOR NEXT SESSION: Postural and core strengthening, hip stability,   use pelvic floor Kegel cue to achieve TA indraw.  Forward ONEIDA Burnard Joy, [place] 05/17/24 11:58 AM  [facility] [address], [address] [place], [place] [address] Phone # [phone] Fax [phone]

## 2024-05-19 ENCOUNTER — Encounter

## 2024-05-23 ENCOUNTER — Ambulatory Visit (INDEPENDENT_AMBULATORY_CARE_PROVIDER_SITE_OTHER): Admitting: Clinical

## 2024-05-23 DIAGNOSIS — F4322 Adjustment disorder with anxiety: Secondary | ICD-10-CM

## 2024-05-23 NOTE — Progress Notes (Signed)
 Time: 2:00pm-2:57pm CPT Code: 09162E Diagnosis: Adjustment disorder with anxiety  Alyrica was seen in person for individual therapy. Session focused on processing several developments in her health and professional life, as well as continuing to process her experience of her most recent relationship. She is scheduled to be seen again in three weeks.   Intake Presenting Problem Kinzlie transitioned from therapy with the present therapist to pursue ERP through the platform [email or web address]. She continued OCD-targeted therapy for several years with a different provider. Since that time, she has moved back in with her parents, and has learned more about how her OCD developed. She was also in a two-year relationship during this time. She became concerned that therapy may have become a compulsion that prevented her from engaging with challenges in her life. She ended therapy and ended the relationship shortly thereafter. She lived with her boyfriend's parents from August 2024 for 6 months. She reported that she has lived with her parents for 1 of the past 10 months, after which time she moved back in with her boyfriend. The relationship ended at the end of April, 2025. She had moved out of their condo as of June 2025. Symptoms Anger, anxiety History of Problem  Delcia reported that living with her ex-boyfriend was especially challenging due to his tendency toward messiness. She has also found the move back in with her parents challenging. Her current struggle relates to having noticed that her parents do not wash their hands after going to the bathroom. Suttyn has been unemployed for a year.  Recent Trigger  Fujiko returned for CBT following a series of major life events. She has pursued ERP for OCD since 2022.  Marital and Family Information  Alesa has two siblings, one of whom is in [place] and the other is in [place]. She and her siblings have experienced challenges with their parents related to different world  views. She has struggled to set boundaries with her parents.   Present family concerns/problems: Malina is in contact with her family and exploring how she might set boundaries with her parents.   Strengths/resources in the family/friends: Aaryanna reconnected in recent years with a friend who has a similar background to hers. They get together regularly.    Interpersonal concerns/problems: Caiya was in her most recent job for over 1.5 years. She described the work environment as highly toxic. She took FMLA for her mental health due to work-related stress. She ultimately left in August 2024, following a conflict with a coworker she had believed was a friend.  General Behavior: WNL Attire: WNL Gait: WNL Motor Activity: WNL Stream of Thought - Productivity: WNL Stream of thought - Progression: WNL Stream of thought - Language:  WNL Emotional tone and reactions - Mood: WNL  Emotional tone and reactions - Affect: WNL Mental trend/Content of thoughts - Perception: WNL  Mental trend/Content of thoughts - Orientation: WNL Mental trend/Content of thoughts - Memory: WNL Mental trend/Content of thoughts - General knowledge: WNL  Insight: WNL Judgment: WNL Intelligence: WNL Mental Status Comment: Client appears oriented to time and space. Overall functioning WNL  Adjustment disorder with anxiety   Treatment Plan Client Abilities/Strengths  Naijah is reflective, uninhibited  Client Treatment Preferences Mallori prefers in person sessions. She prefers sessions that fit with her work schedule and are not too early in the morning.  Client Statement of Needs  Andy shared that she is seeking an outside, unattached perspective on her life situation and experiences.  Treatment Level  She would like  biweekly sessions.  Symptoms  Anxiety, anger, sleep challenges (she described herself as a light sleeper whose sleep is easily disrupted) Problems Addressed  Brean shared that she has a tendency toward  negativity that she would like to address.  Goals 1. Shannin would like to process her breakup with her ex-boyfriend.  Objective Geraline would like to better understand the role she wants romantic relationship to play in her life, if any  Target Date: 04/06/2025 Frequency: biweekly  Progress: 0 Modality: Individual therapy  Related Interventions Tashya will have opportunities to process her experiences in session Therapist will help Creta notice and disengage from maladaptive thoughts and behaviors using CBT based strategies Therapist will work with Lauraine to establish and set healthy boundaries in relationship Therapist will provide referrals for additional resources as appropriate   Adjustment disorder with Anxiety         Andriette [name], PhD               Davy Faught L Latania Bascomb, PhD

## 2024-05-24 ENCOUNTER — Ambulatory Visit: Attending: Pain Medicine

## 2024-05-24 DIAGNOSIS — R252 Cramp and spasm: Secondary | ICD-10-CM | POA: Diagnosis present

## 2024-05-24 DIAGNOSIS — R262 Difficulty in walking, not elsewhere classified: Secondary | ICD-10-CM | POA: Insufficient documentation

## 2024-05-24 DIAGNOSIS — R293 Abnormal posture: Secondary | ICD-10-CM | POA: Insufficient documentation

## 2024-05-24 DIAGNOSIS — M5459 Other low back pain: Secondary | ICD-10-CM | POA: Diagnosis present

## 2024-05-24 DIAGNOSIS — M6281 Muscle weakness (generalized): Secondary | ICD-10-CM | POA: Insufficient documentation

## 2024-05-24 NOTE — Therapy (Signed)
 OUTPATIENT PHYSICAL THERAPY THORACOLUMBAR TREATMENT   Patient Name: Mary Wyatt MRN: [id number] DOB:1985/12/29, 38 y.o., female Today's Date: 05/24/2024  END OF SESSION:  PT End of Session - 05/24/24 1105     Visit Number 12    Number of Visits 10    Date for PT Re-Evaluation 06/03/24    Authorization Type 4 visits-05/16/2024-06/14/2024-auth#[id number]    Authorization Time Period 05/16/24-06/13/24    Authorization - Visit Number 2    Authorization - Number of Visits 4    PT Start Time 1107    PT Stop Time 1155    PT Time Calculation (min) 48 min    Activity Tolerance Patient tolerated treatment well    Behavior During Therapy [facility] for tasks assessed/performed                  Past Medical History:  Diagnosis Date   Abdominal pain    Abnormal EKG    Dec 2019 and Jan 2020/ saw cardiologist   Allergy    Altered bowel function    Anemia    Anxiety    Biliary dyskinesia    Celiac disease    Chest tightness    Depression    Dermatitis    Deviated septum    DOE (dyspnea on exertion)    Dysmenorrhea    Endometriosis    Episodic lightheadedness    ETD (Eustachian tube dysfunction), bilateral    Family history of brain aneurysm    Genital warts    GERD (gastroesophageal reflux disease)    Hashimoto's disease    History of meningioma    Hypotension    Insomnia    Interstitial cystitis    Meningioma (HCC)    Mononucleosis    Pelvic pain    Perineal pain    Post-operative nausea and vomiting    Rhinitis    [facility] spotted fever) 2015   S/P resection of meningioma    Seasonal allergic rhinitis    Tendinitis    Ulcer    Unspecified dyspareunia    Urethral syndrome    Vitamin D  deficiency    Past Surgical History:  Procedure Laterality Date   COLONOSCOPY  2013   CRANIOTOMY  09/2018   laproscopic surgery     2013 and 2019   OVARIAN CYST REMOVAL     TONSILLECTOMY     UPPER GASTROINTESTINAL ENDOSCOPY  2013   Patient Active Problem List    Diagnosis Date Noted   Hypotension - chronic 09/19/2021   Anxiety and depression 06/15/2017   Family history of brain aneurysm 04/19/2017   Rhinitis, allergic 04/20/2014   Generalized anxiety disorder 04/20/2014   Celiac disease 12/15/2012   Interstitial cystitis     PCP: Leila Lucie LABOR, MD  REFERRING PROVIDER: Constantino Bong, MD   REFERRING DIAG: M54.50 (ICD-10-CM) - Low back pain, unspecified  Rationale for Evaluation and Treatment: Rehabilitation  THERAPY DIAG:  Other low back pain  Difficulty in walking, not elsewhere classified  Muscle weakness (generalized)  Cramp and spasm  Abnormal posture  ONSET DATE: years  SUBJECTIVE:  SUBJECTIVE STATEMENT: Pain is 4/10 today.  I have moments where I am at 3/10.  She mentions that she is now having neck and base of head pain.  PT questioned patient about this season of life, if she was dealing with a lot of stress.  She admits that this past year nothing has gone well and she is, in fact, dealing with a lot of life stressors  We discussed how this can result in a lot of muscle tension and pain, resulting in constant physical symptoms.  Discussed consistent exercise and relaxation techniques to help combat this.  MRI (with contrast) results reviewed    PERTINENT HISTORY:  recurrent BV and yeast,anxiety, celiac disease, depression, endometriosis, genital warts, IC, CRANIOTOMY 2019,   PAIN:  05/12/24 Are you having pain? Yes: NPRS scale: 4/10 Pain location: Rt SI joint Pain description: spasm in mid back (with standing/activity), low back is constant dull achy Aggravating factors: walking with mild increase; static standing starts to spasm after 5 mins, stairs Relieving factors: mid back with rest; low back is constant  PRECAUTIONS:  None  RED FLAGS: None   WEIGHT BEARING RESTRICTIONS: No  FALLS:  Has patient fallen in last 6 months? No  LIVING ENVIRONMENT: Lives with: lives with their family Lives in: House/apartment Stairs: No Has following equipment at home: None  OCCUPATION: not currently  PLOF: Independent  PATIENT GOALS: to have less pain, wants to be able to stand for 2 hours for better tolerance for working and social activities  NEXT MD VISIT: 04/12/24 MRI scheduled for spine  OBJECTIVE:  Note: Objective measures were completed at Evaluation unless otherwise noted.  DIAGNOSTIC FINDINGS:  04/2024 Thoracic spine:  No high-grade canal or foraminal stenosis within the thoracic spine.  2.  No convincing evidence of thoracic spinal cord signal abnormality. There is apparent ill-defined T2 hyperintense signal within the tip of the conus at the level of L2, seen only on the axial T2-weighted images. This may be artifactual in nature, however recommend correlation with evidence of myelopathy.  3.  No abnormal enhancement identified. Impression: MRI spine lumbar without contrast 09/2024 Small right extra foraminal disc protrusion at L4-5, closely approximating and potentially irritation the exiting right L4 nerve root Central disc protrusion at L5-S1, closely approximating the descending S1 nerve roots without overt neural impingement  Cervical:  1.  Mild multilevel cervical spondylosis as described above. No high-grade canal stenosis or foraminal narrowing.  2.  No cervical cord signal abnormality.  3.  No abnormal enhancement identified.   05/15/24 Second MRI due to did not use contrast in December Impression 1.  No high grade canal or foraminal stenosis within the lumbar spine.  2.  No significant change since lumbar spine MRI 10/16/2023.  3.  No evidence of abnormal enhancement.  PATIENT SURVEYS:  04/20/24: Oswestry Score: 21/50 or 42% (unchanged to date) EVAL: Oswestry Score: 21 / 50 or 42  % 05/10/24: Modified Oswestry 20/50=40% disability    COGNITION: Overall cognitive status: Within functional limits for tasks assessed     SENSATION: Reports some pins and needles constant in bil feet (feels like electric current); and reports sometimes has tingling in bil hands but not consistent and not worse with pain   MUSCLE LENGTH: 04/20/24:  Significant restriction of bil quads and hip flexors, adductors, gluteals Lt>Rt - anterior hip tension pulling on lumbar spine Improving HS length bil  Eval: Hamstrings: bil limited by 25% Adductors limited by 25% bil and trigger points present bil as  well   POSTURE: rounded shoulders, increased thoracic kyphosis, and posterior pelvic tilt  PALPATION:  04/20/24: Very tender bil SI joint with superior apsect of glute max atrophy present Tight and tender with trigger points glut med, min, proximal quads, hip flexors  Eval: TTP throughout midline and bil paraspinals in lumbar spine but worse at Lt side; Lt SIJ most tender area, bil piriformis also TTP  LUMBAR ROM:   AROM eval 04/20/24 05/10/24  Flexion Pain with immediate flexion mobility but able to complete and limited by 25% Full flexion fingers to toes with lumbar stretch, no pain   Extension Limited by 25% Limited 25% with pain in lumbar   Right lateral flexion Fingertips to 2 in above knee with pain Fingertips to knees with Lt lumbar pain Full with pain  Left lateral flexion Fingertips to 2 in above knee with pain Fingertips to knees with Lt lumbar pain   Right rotation [facility] WNL Full with pain  Left rotation WFL WNL    (Blank rows = not tested)  LOWER EXTREMITY ROM:      04/20/24   Limited Lt end range ER and hip flexion with pain EVAL: WFL but pain with bil hip flexion, bil hip ER  LOWER EXTREMITY MMT:     04/20/24:   Hip abd 4+/5 but quick to fatigue with repeated testing  Eval: Bil hips grossly 4+/5 except for flexion and abduction 4/5  LUMBAR SPECIAL TESTS:  04/20/24 SLR +  bil for LBP > LE pain  EVAL Straight leg raise test: pain bil in low back, Single leg stance test: no pain either side, SI Compression/distraction test: compression improved pain, and FABER test: Negative  FUNCTIONAL TESTS:  EVAL: Functional squat - mild bil knee valgus and reports back pain at full end range of squat   Sit up test - 2/3  Functional squat: 10# added - some LBP and hyperextension of spine Sit up test: 2/3 GAIT: Distance walked: 250' Assistive device utilized: None Level of assistance: Complete Independence Comments: decreased bil step height, decreased   TREATMENT DATE:  [facility] Adult PT Treatment:                                                DATE: 05/24/24 Recumbent bike Standing hamstring stretch 3 x 30 sec Standing quad/hip flexor stretch 3 x 30 sec Supine PPT x 20 PPT with 90/90 heel tap x 20 (had to take one rest break) PPT with dying bug  Hook lying LTR x 20 Hook lying ball squeeze with TA activation x 20 Prone lying x 2 min Prone on elbows x 2 min Prone press (modified to low level) x 10 Ice to lumbar spine x 10 min   [facility] Adult PT Treatment:                                                DATE: 05/17/24 NuStep L4 x6' PT present to discuss symptoms and progress (blue machine) Prone on elbows x 2 min Prone press (modified to low level) x 10 Prone hip extension with TA activation x15 each Seated mini sit up with 5 lbs 2 x 10 Seated modified Russian Twist x 20 (10 to each side) with 5  lbs Seated hip to shoulder slightly reclined with 5 lbs 2 x 10 each side Seated hamstring stretch 2x20 seconds bil each  TA activation with ball squeeze: 5 hold x10 Seated ball press bilateral x 20 in supine  Standing: Farmer's carry with 5# kettlebell 2x20 alternating marching   [facility] Adult PT Treatment:                                                DATE: 05/12/24 NuStep L5 x6' PT present to discuss symptoms and progress Prone lying x 2 min Prone on elbows x 2 min Prone  press (modified to low level) x 10 Seated TA activation x 20 Seated mini sit up with 5 lbs 2 x 10 Seated modified Russian Twist x 20 (10 to each side) with 5 lbs Seated hip to shoulder slightly reclined with 5 lbs 2 x 10 each side Seated hamstring stretch 2x20 seconds bil each  TA activation with ball squeeze: 5 hold x10 Seated ball press bilateral x 20 Seated press into ball- opposite arm to opposite knee x 10 each side   PATIENT EDUCATION:  Education details: [email or web address]  Person educated: Patient Education method: Explanation, Demonstration, Tactile cues, Verbal cues, and Handouts Education comprehension: verbalized understanding, returned demonstration, verbal cues required, tactile cues required, and needs further education  HOME EXERCISE PROGRAM: Access Code: [email or web address] URL: [email or web address] Date: 04/20/2024 Prepared by: Orvil Beuhring  Exercises - Seated Diaphragmatic Breathing  - 1 x daily - 7 x weekly - 2 sets - 10 reps - Supine Diaphragmatic Breathing  - 1 x daily - 7 x weekly - 2 sets - 10 reps - Supine Lower Trunk Rotation  - 1 x daily - 7 x weekly - 1 sets - 10 reps - 5s holds - Supine Butterfly Groin Stretch  - 1 x daily - 7 x weekly - 3 sets - 30s hold - Supine Single Knee to Chest  - 1 x daily - 7 x weekly - 1 sets - 3 reps - 30s holds - Diaphragmatic Breathing in Child's Pose with Pelvic Floor Relaxation  - 1 x daily - 7 x weekly - 3 sets - 30s hold - Figure 4 Gluteus Mobilization on Foam Roll  - 1 x daily - 7 x weekly - 1 sets - 10 reps - Beginner Bridge  - 1 x daily - 7 x weekly - 1 sets - 10 reps - Bird Dog  - 1 x daily - 7 x weekly - 1 sets - 10 reps - Standing Diagonal Chop  - 1 x daily - 7 x weekly - 1 sets - 10 reps - clamshell and marching  - 1 x daily - 7 x weekly - 1 sets - 10 reps - Supine Posterior Pelvic Tilt  - 1 x daily - 7 x weekly - 1 sets - 10 reps - 5 hold - Supine 90/90 Alternating Heel Touches with Posterior Pelvic Tilt  - 1 x  daily - 7 x weekly - 1 sets - 10 reps - Supine Piriformis Stretch with Foot on Ground  - 1 x daily - 7 x weekly - 1 sets - 2 reps - 20-30 hold - Isometric Dead Bug  - 1 x daily - 7 x weekly - 1 sets - 5 reps - 10 hold - Quadruped Transversus Abdominis Bracing  - 1 x daily -  7 x weekly - 1 sets - 10 reps - 5 hold  ASSESSMENT:  CLINICAL IMPRESSION: Mary Wyatt seems to be approaching somewhat of a plateau.  The MRI findings were unremarkable compared to last MRI.  She does admit to some life stress for the past year that may be contributing to her pain cycle and muscle tension.  She is well motivated and compliant.  She asked about her neck as it is now becoming a problem.  She spoke with her pain doctor about this and she suggested TP injections or dry needling.  Explained to patient that we would need a referral for us  to evaluate her neck to be able to treat it.  She then explained that she is also having some ankle pain that she needs to address first.  It was suggested that we focus on her back pain and resolving these issues as this seems to be her primary source of pain.  We also discussed the merits of consistent exercise to reduce the stress effects.   She would benefit from continuing skilled PT to meet goals stated below.    OBJECTIVE IMPAIRMENTS: decreased activity tolerance, decreased coordination, decreased endurance, decreased mobility, difficulty walking, decreased ROM, decreased strength, increased fascial restrictions, impaired perceived functional ability, increased muscle spasms, impaired flexibility, impaired sensation, improper body mechanics, postural dysfunction, and pain.   ACTIVITY LIMITATIONS: carrying, lifting, bending, sitting, standing, squatting, stairs, and locomotion level  PARTICIPATION LIMITATIONS: meal prep, cleaning, laundry, driving, shopping, community activity, and yard work  PERSONAL FACTORS: Fitness, Past/current experiences, and Time since onset of  injury/illness/exacerbation are also affecting patient's functional outcome.   REHAB POTENTIAL: Good  CLINICAL DECISION MAKING: Stable/uncomplicated  EVALUATION COMPLEXITY: Low   GOALS: Goals reviewed with patient? Yes  SHORT TERM GOALS: Target date: 05/02/24  Pt to be I with HEP for carry over and continuing recommendations for improved outcomes.   Baseline: Goal status: MET 04/20/24  2.  Pt to report ability to tolerate standing at least 30 mins without worsening of pain for ability to complete vacuuming at home.  Baseline: 7-10 min with stationary activities, able to do things in motion (05/05/24) Goal status: ONGOING  3.  Pt to demonstrate ability to complete x10 body weight squats without worsening pain for improved ability to tolerate hip and core strengthening for returning to work outs.  Baseline: able to squat x 10 without increased pain (05/05/24) Goal status: MET     LONG TERM GOALS: Target date: 06/03/24  Pt to be I with advanced HEP for carry over and continuing recommendations for improved outcomes.   Baseline:  Goal status: In progress  2.  Pt to demonstrate full ROM of spine in all directions without increase in pain for improved tolerance to walking.  Baseline: full except for Rt rotation.  Pain with Rt rotation and side bending (05/10/24) Goal status: in progress   3.  Pt to report ability to tolerate prolonged standing for 1 hour to tolerate grocery shopping without increase in pain.  Baseline:  Goal status: MET (patient states it doesn't take her that long to grocery shop and she is able to do this without pain)  4.  Pt to demonstrate ability to complete x10 20# squats for improved hip strength and stability for walking at least 2 hours without increase in pain.  Baseline: not walking due to ankle issues, 30 min prior to this (05/10/24) Goal status: In progress  5.  Pt to demonstrate improved core strength and pelvic stability to  decreased strain at low back  by demonstrating ability to standing without sway in single leg stance for at least 15s each.  Baseline:  Goal status: In Progress  6.  Pt to demonstrate at least 5/5 bil hip strength for improved pelvic stability and functional squats without pain for tolerance to carrying in groceries.  Baseline:  Goal status: In Progress    PLAN:  PT FREQUENCY: 2x/week  PT DURATION: 8 weeks  PLANNED INTERVENTIONS: 97110-Therapeutic exercises, 97530- Therapeutic activity, 97112- Neuromuscular re-education, 97535- Self Care, 02859- Manual therapy, [id number]- Aquatic Therapy, [phone]- Electrical stimulation (unattended), [phone]- Electrical stimulation (manual), [id number]- Vasopneumatic device, [id number]- Ultrasound, [id number]- Traction (mechanical), [id number]- Ionotophoresis 4mg /ml Dexamethasone, [id number] (1-2 muscles), [id number] (3+ muscles)- Dry Needling, Patient/Family education, Balance training, Taping, Joint mobilization, Joint manipulation, Spinal manipulation, Spinal mobilization, Scar mobilization, Vestibular training, DME instructions, Cryotherapy, Moist heat, and Biofeedback.  PLAN FOR NEXT SESSION: Discussed DC with patient.  She has an order at [facility] to have her ankle evaluated and begin therapy for her ankle.  She feels that since her back issues are fairly controlled, she would like to move fwd with her therapy at [facility] for her ankle.  She will be ready for DC on her last approved visit.  Add lateral band walks, clam and side lying clam for hip strength.  Continue postural and core strengthening, hip stability,  use pelvic floor Kegel cue to achieve TA indraw.  Forward T  Mary Wyatt, PT 05/24/24 2:29 PM [facility] [address], [address] [place], [place] [address] Phone # [phone] Fax [phone]

## 2024-05-26 ENCOUNTER — Encounter

## 2024-05-31 ENCOUNTER — Ambulatory Visit

## 2024-06-02 ENCOUNTER — Ambulatory Visit

## 2024-06-02 DIAGNOSIS — M6281 Muscle weakness (generalized): Secondary | ICD-10-CM

## 2024-06-02 DIAGNOSIS — M5459 Other low back pain: Secondary | ICD-10-CM | POA: Diagnosis not present

## 2024-06-02 DIAGNOSIS — R262 Difficulty in walking, not elsewhere classified: Secondary | ICD-10-CM

## 2024-06-02 DIAGNOSIS — R293 Abnormal posture: Secondary | ICD-10-CM

## 2024-06-02 DIAGNOSIS — R252 Cramp and spasm: Secondary | ICD-10-CM

## 2024-06-02 NOTE — Therapy (Addendum)
 OUTPATIENT PHYSICAL THERAPY THORACOLUMBAR TREATMENT   Patient Name: Mary Wyatt MRN: [id number] DOB:July 24, 1986, 38 y.o., female Today's Date: 06/02/2024  END OF SESSION:  PT End of Session - 06/02/24 1238     Visit Number 13    Date for PT Re-Evaluation 06/03/24    Authorization Type 4 visits-05/16/2024-06/14/2024-auth#[id number]    Authorization Time Period 05/16/24-06/13/24    Authorization - Visit Number 3    Authorization - Number of Visits 4    PT Start Time 1232    PT Stop Time 1311    PT Time Calculation (min) 39 min    Activity Tolerance Patient tolerated treatment well    Behavior During Therapy [facility] for tasks assessed/performed                   Past Medical History:  Diagnosis Date   Abdominal pain    Abnormal EKG    Dec 2019 and Jan 2020/ saw cardiologist   Allergy    Altered bowel function    Anemia    Anxiety    Biliary dyskinesia    Celiac disease    Chest tightness    Depression    Dermatitis    Deviated septum    DOE (dyspnea on exertion)    Dysmenorrhea    Endometriosis    Episodic lightheadedness    ETD (Eustachian tube dysfunction), bilateral    Family history of brain aneurysm    Genital warts    GERD (gastroesophageal reflux disease)    Hashimoto's disease    History of meningioma    Hypotension    Insomnia    Interstitial cystitis    Meningioma (HCC)    Mononucleosis    Pelvic pain    Perineal pain    Post-operative nausea and vomiting    Rhinitis    [facility] spotted fever) 2015   S/P resection of meningioma    Seasonal allergic rhinitis    Tendinitis    Ulcer    Unspecified dyspareunia    Urethral syndrome    Vitamin D  deficiency    Past Surgical History:  Procedure Laterality Date   COLONOSCOPY  2013   CRANIOTOMY  09/2018   laproscopic surgery     2013 and 2019   OVARIAN CYST REMOVAL     TONSILLECTOMY     UPPER GASTROINTESTINAL ENDOSCOPY  2013   Patient Active Problem List   Diagnosis Date  Noted   Hypotension - chronic 09/19/2021   Anxiety and depression 06/15/2017   Family history of brain aneurysm 04/19/2017   Rhinitis, allergic 04/20/2014   Generalized anxiety disorder 04/20/2014   Celiac disease 12/15/2012   Interstitial cystitis     PCP: Leila Lucie LABOR, MD  REFERRING PROVIDER: Constantino Bong, MD   REFERRING DIAG: M54.50 (ICD-10-CM) - Low back pain, unspecified  Rationale for Evaluation and Treatment: Rehabilitation  THERAPY DIAG:  Other low back pain  Difficulty in walking, not elsewhere classified  Muscle weakness (generalized)  Cramp and spasm  Abnormal posture  ONSET DATE: years  SUBJECTIVE:  SUBJECTIVE STATEMENT: I haven't been doing my exercises.  I have a lot of appointments and I'm just exhausted.   PERTINENT HISTORY:  recurrent BV and yeast,anxiety, celiac disease, depression, endometriosis, genital warts, IC, CRANIOTOMY 2019,   PAIN:  06/02/24 Are you having pain? Yes: NPRS scale: 4/10 Pain location: Rt SI joint Pain description: spasm in mid back (with standing/activity), low back is constant dull achy Aggravating factors: walking with mild increase; static standing starts to spasm after 5 mins, stairs Relieving factors: mid back with rest; low back is constant  PRECAUTIONS: None  RED FLAGS: None   WEIGHT BEARING RESTRICTIONS: No  FALLS:  Has patient fallen in last 6 months? No  LIVING ENVIRONMENT: Lives with: lives with their family Lives in: House/apartment Stairs: No Has following equipment at home: None  OCCUPATION: not currently  PLOF: Independent  PATIENT GOALS: to have less pain, wants to be able to stand for 2 hours for better tolerance for working and social activities  NEXT MD VISIT: 04/12/24 MRI scheduled for  spine  OBJECTIVE:  Note: Objective measures were completed at Evaluation unless otherwise noted.  DIAGNOSTIC FINDINGS:  04/2024 Thoracic spine:  No high-grade canal or foraminal stenosis within the thoracic spine.  2.  No convincing evidence of thoracic spinal cord signal abnormality. There is apparent ill-defined T2 hyperintense signal within the tip of the conus at the level of L2, seen only on the axial T2-weighted images. This may be artifactual in nature, however recommend correlation with evidence of myelopathy.  3.  No abnormal enhancement identified. Impression: MRI spine lumbar without contrast 09/2024 Small right extra foraminal disc protrusion at L4-5, closely approximating and potentially irritation the exiting right L4 nerve root Central disc protrusion at L5-S1, closely approximating the descending S1 nerve roots without overt neural impingement  Cervical:  1.  Mild multilevel cervical spondylosis as described above. No high-grade canal stenosis or foraminal narrowing.  2.  No cervical cord signal abnormality.  3.  No abnormal enhancement identified.   05/15/24 Second MRI due to did not use contrast in December Impression 1.  No high grade canal or foraminal stenosis within the lumbar spine.  2.  No significant change since lumbar spine MRI 10/16/2023.  3.  No evidence of abnormal enhancement.  PATIENT SURVEYS:  04/20/24: Oswestry Score: 21/50 or 42% (unchanged to date) EVAL: Oswestry Score: 21 / 50 or 42 % 05/10/24: Modified Oswestry 20/50=40% disability    COGNITION: Overall cognitive status: Within functional limits for tasks assessed     SENSATION: Reports some pins and needles constant in bil feet (feels like electric current); and reports sometimes has tingling in bil hands but not consistent and not worse with pain   MUSCLE LENGTH: 04/20/24:  Significant restriction of bil quads and hip flexors, adductors, gluteals Lt>Rt - anterior hip tension pulling on lumbar  spine Improving HS length bil  Eval: Hamstrings: bil limited by 25% Adductors limited by 25% bil and trigger points present bil as well   POSTURE: rounded shoulders, increased thoracic kyphosis, and posterior pelvic tilt  PALPATION:  04/20/24: Very tender bil SI joint with superior apsect of glute max atrophy present Tight and tender with trigger points glut med, min, proximal quads, hip flexors  Eval: TTP throughout midline and bil paraspinals in lumbar spine but worse at Lt side; Lt SIJ most tender area, bil piriformis also TTP  LUMBAR ROM:   AROM eval 04/20/24 05/10/24  Flexion Pain with immediate flexion mobility but able to complete and  limited by 25% Full flexion fingers to toes with lumbar stretch, no pain   Extension Limited by 25% Limited 25% with pain in lumbar   Right lateral flexion Fingertips to 2 in above knee with pain Fingertips to knees with Lt lumbar pain Full with pain  Left lateral flexion Fingertips to 2 in above knee with pain Fingertips to knees with Lt lumbar pain   Right rotation [facility] WNL Full with pain  Left rotation WFL WNL    (Blank rows = not tested)  LOWER EXTREMITY ROM:      04/20/24   Limited Lt end range ER and hip flexion with pain EVAL: WFL but pain with bil hip flexion, bil hip ER  LOWER EXTREMITY MMT:     04/20/24:   Hip abd 4+/5 but quick to fatigue with repeated testing  Eval: Bil hips grossly 4+/5 except for flexion and abduction 4/5  LUMBAR SPECIAL TESTS:  04/20/24 SLR + bil for LBP > LE pain  EVAL Straight leg raise test: pain bil in low back, Single leg stance test: no pain either side, SI Compression/distraction test: compression improved pain, and FABER test: Negative  FUNCTIONAL TESTS:  EVAL: Functional squat - mild bil knee valgus and reports back pain at full end range of squat   Sit up test - 2/3  Functional squat: 10# added - some LBP and hyperextension of spine Sit up test: 2/3 GAIT: Distance walked: 250' Assistive  device utilized: None Level of assistance: Complete Independence Comments: decreased bil step height, decreased   TREATMENT DATE:  [facility] Adult PT Treatment:                                                DATE: 06/02/24 NuStep: level 6x6 minutes-PT present to discuss progress and monitor Standing hamstring stretch 3 x 30 sec Standing Farmer's carry with 5# kettlebell marching 2x10 bil each Supine PPT x 20 PPT with opposite arm/leg press into purple ball 5 hold x10 each  Hook lying LTR x 20 Hook lying ball squeeze with TA activation x 20 Sit to stand: 5# parallel and staggered stance x10 each  Prone on elbows x 2 min Prone press (modified to low level) x 10   [facility] Adult PT Treatment:                                                DATE: 05/24/24 Recumbent bike Standing hamstring stretch 3 x 30 sec Standing quad/hip flexor stretch 3 x 30 sec Supine PPT x 20 PPT with 90/90 heel tap x 20 (had to take one rest break) PPT with dying bug  Hook lying LTR x 20 Hook lying ball squeeze with TA activation x 20 Prone lying x 2 min Prone on elbows x 2 min Prone press (modified to low level) x 10 Ice to lumbar spine x 10 min   [facility] Adult PT Treatment:                                                DATE: 05/17/24 NuStep L4 x6' PT present to discuss  symptoms and progress (blue machine) Prone on elbows x 2 min Prone press (modified to low level) x 10 Prone hip extension with TA activation x15 each Seated mini sit up with 5 lbs 2 x 10 Seated modified Russian Twist x 20 (10 to each side) with 5 lbs Seated hip to shoulder slightly reclined with 5 lbs 2 x 10 each side Seated hamstring stretch 2x20 seconds bil each  TA activation with ball squeeze: 5 hold x10 Seated ball press bilateral x 20 in supine  Standing: Farmer's carry with 5# kettlebell 2x20 alternating marching   PATIENT EDUCATION:  Education details: [email or web address]  Person educated: Patient Education method: Explanation, Demonstration,  Tactile cues, Verbal cues, and Handouts Education comprehension: verbalized understanding, returned demonstration, verbal cues required, tactile cues required, and needs further education  HOME EXERCISE PROGRAM: Access Code: [email or web address] URL: [email or web address] Date: 04/20/2024 Prepared by: Orvil Beuhring  Exercises - Seated Diaphragmatic Breathing  - 1 x daily - 7 x weekly - 2 sets - 10 reps - Supine Diaphragmatic Breathing  - 1 x daily - 7 x weekly - 2 sets - 10 reps - Supine Lower Trunk Rotation  - 1 x daily - 7 x weekly - 1 sets - 10 reps - 5s holds - Supine Butterfly Groin Stretch  - 1 x daily - 7 x weekly - 3 sets - 30s hold - Supine Single Knee to Chest  - 1 x daily - 7 x weekly - 1 sets - 3 reps - 30s holds - Diaphragmatic Breathing in Child's Pose with Pelvic Floor Relaxation  - 1 x daily - 7 x weekly - 3 sets - 30s hold - Figure 4 Gluteus Mobilization on Foam Roll  - 1 x daily - 7 x weekly - 1 sets - 10 reps - Beginner Bridge  - 1 x daily - 7 x weekly - 1 sets - 10 reps - Bird Dog  - 1 x daily - 7 x weekly - 1 sets - 10 reps - Standing Diagonal Chop  - 1 x daily - 7 x weekly - 1 sets - 10 reps - clamshell and marching  - 1 x daily - 7 x weekly - 1 sets - 10 reps - Supine Posterior Pelvic Tilt  - 1 x daily - 7 x weekly - 1 sets - 10 reps - 5 hold - Supine 90/90 Alternating Heel Touches with Posterior Pelvic Tilt  - 1 x daily - 7 x weekly - 1 sets - 10 reps - Supine Piriformis Stretch with Foot on Ground  - 1 x daily - 7 x weekly - 1 sets - 2 reps - 20-30 hold - Isometric Dead Bug  - 1 x daily - 7 x weekly - 1 sets - 5 reps - 10 hold - Quadruped Transversus Abdominis Bracing  - 1 x daily - 7 x weekly - 1 sets - 10 reps - 5 hold  ASSESSMENT:  CLINICAL IMPRESSION: Pt denies any change and denies compliance with HEP due to being busy with medical appts.  We discussed the merits of consistent exercise to reduce the stress effects.  She tolerated all exercises without  difficulty today and PT monitored for pain and technique.  She will attend 1 more session if able to get in before visits expire.   OBJECTIVE IMPAIRMENTS: decreased activity tolerance, decreased coordination, decreased endurance, decreased mobility, difficulty walking, decreased ROM, decreased strength, increased fascial restrictions, impaired perceived functional ability, increased muscle spasms, impaired flexibility, impaired sensation, improper body  mechanics, postural dysfunction, and pain.   ACTIVITY LIMITATIONS: carrying, lifting, bending, sitting, standing, squatting, stairs, and locomotion level  PARTICIPATION LIMITATIONS: meal prep, cleaning, laundry, driving, shopping, community activity, and yard work  PERSONAL FACTORS: Fitness, Past/current experiences, and Time since onset of injury/illness/exacerbation are also affecting patient's functional outcome.   REHAB POTENTIAL: Good  CLINICAL DECISION MAKING: Stable/uncomplicated  EVALUATION COMPLEXITY: Low   GOALS: Goals reviewed with patient? Yes  SHORT TERM GOALS: Target date: 05/02/24  Pt to be I with HEP for carry over and continuing recommendations for improved outcomes.   Baseline: Goal status: MET 04/20/24  2.  Pt to report ability to tolerate standing at least 30 mins without worsening of pain for ability to complete vacuuming at home.  Baseline: 7-10 min with stationary activities, able to do things in motion (05/05/24) Goal status: ONGOING  3.  Pt to demonstrate ability to complete x10 body weight squats without worsening pain for improved ability to tolerate hip and core strengthening for returning to work outs.  Baseline: able to squat x 10 without increased pain (05/05/24) Goal status: MET     LONG TERM GOALS: Target date: 06/03/24  Pt to be I with advanced HEP for carry over and continuing recommendations for improved outcomes.   Baseline:  Goal status: In progress  2.  Pt to demonstrate full ROM of spine in  all directions without increase in pain for improved tolerance to walking.  Baseline: full except for Rt rotation.  Pain with Rt rotation and side bending (05/10/24) Goal status: in progress   3.  Pt to report ability to tolerate prolonged standing for 1 hour to tolerate grocery shopping without increase in pain.  Baseline:  Goal status: MET (patient states it doesn't take her that long to grocery shop and she is able to do this without pain)  4.  Pt to demonstrate ability to complete x10 20# squats for improved hip strength and stability for walking at least 2 hours without increase in pain.  Baseline: not walking due to ankle issues, 30 min prior to this (05/10/24) Goal status: In progress  5.  Pt to demonstrate improved core strength and pelvic stability to decreased strain at low back by demonstrating ability to standing without sway in single leg stance for at least 15s each.  Baseline:  Goal status: In Progress  6.  Pt to demonstrate at least 5/5 bil hip strength for improved pelvic stability and functional squats without pain for tolerance to carrying in groceries.  Baseline:  Goal status: In Progress    PLAN:  PT FREQUENCY: 2x/week  PT DURATION: 8 weeks  PLANNED INTERVENTIONS: 97110-Therapeutic exercises, 97530- Therapeutic activity, 97112- Neuromuscular re-education, 97535- Self Care, 02859- Manual therapy, [phone]- Aquatic Therapy, [phone]- Electrical stimulation (unattended), [phone]- Electrical stimulation (manual), [id number]- Vasopneumatic device, [id number]- Ultrasound, [id number]- Traction (mechanical), [id number]- Ionotophoresis 4mg /ml Dexamethasone, 79439 (1-2 muscles), 20561 (3+ muscles)- Dry Needling, Patient/Family education, Balance training, Taping, Joint mobilization, Joint manipulation, Spinal manipulation, Spinal mobilization, Scar mobilization, Vestibular training, DME instructions, Cryotherapy, Moist heat, and Biofeedback.  PLAN FOR NEXT SESSION: 1 more session if she can schedule one  before 8/25. If she returns, will need recert to cover last visit.  Burnard Joy, PT 06/02/24 1:17 PM   PHYSICAL THERAPY DISCHARGE SUMMARY  Visits from Start of Care: 13  Current functional level related to goals / functional outcomes: See above for most current PT status.    Remaining deficits: See above.    Education / Equipment: HEP. Posture,  body mechanics    Patient agrees to discharge. Patient goals were partially met. Patient is being discharged due to being pleased with the current functional level.  Burnard Joy, PT 06/21/24 5:44 PM   [facility] [address] [place], [place] [address] Phone # [phone] Fax [phone]

## 2024-06-13 ENCOUNTER — Ambulatory Visit (INDEPENDENT_AMBULATORY_CARE_PROVIDER_SITE_OTHER): Admitting: Clinical

## 2024-06-13 DIAGNOSIS — F4322 Adjustment disorder with anxiety: Secondary | ICD-10-CM

## 2024-06-13 NOTE — Progress Notes (Signed)
 Time: 1:00pm-1:57pm CPT Code: 09162E Diagnosis: Adjustment disorder with anxiety  Mary Wyatt was seen in person for individual therapy. She had not gotten news she hoped for about the job or about recent scans of her brain tumor. Session focused on processing this, as well as feelings of loneliness and developments in her relationships. She is scheduled to be seen again in one week.   Intake Presenting Problem Mary Wyatt transitioned from therapy with the present therapist to pursue ERP through the platform [email or web address]. She continued OCD-targeted therapy for several years with a different provider. Since that time, she has moved back in with her parents, and has learned more about how her OCD developed. She was also in a two-year relationship during this time. She became concerned that therapy may have become a compulsion that prevented her from engaging with challenges in her life. She ended therapy and ended the relationship shortly thereafter. She lived with her boyfriend's parents from August 2024 for 6 months. She reported that she has lived with her parents for 1 of the past 10 months, after which time she moved back in with her boyfriend. The relationship ended at the end of April, 2025. She had moved out of their condo as of June 2025. Symptoms Anger, anxiety History of Problem  Mary Wyatt reported that living with her ex-boyfriend was especially challenging due to his tendency toward messiness. She has also found the move back in with her parents challenging. Her current struggle relates to having noticed that her parents do not wash their hands after going to the bathroom. Mary Wyatt has been unemployed for a year.  Recent Trigger  Mary Wyatt returned for CBT following a series of major life events. She has pursued ERP for OCD since 2022.  Marital and Family Information  Mary Wyatt has two siblings, one of whom is in [place] and the other is in [place]. She and her siblings have experienced challenges with their  parents related to different world views. She has struggled to set boundaries with her parents.   Present family concerns/problems: Mary Wyatt is in contact with her family and exploring how she might set boundaries with her parents.   Strengths/resources in the family/friends: Mary Wyatt reconnected in recent years with a friend who has a similar background to hers. They get together regularly.    Interpersonal concerns/problems: Mary Wyatt was in her most recent job for over 1.5 years. She described the work environment as highly toxic. She took FMLA for her mental health due to work-related stress. She ultimately left in August 2024, following a conflict with a coworker she had believed was a friend.  General Behavior: WNL Attire: WNL Gait: WNL Motor Activity: WNL Stream of Thought - Productivity: WNL Stream of thought - Progression: WNL Stream of thought - Language:  WNL Emotional tone and reactions - Mood: WNL  Emotional tone and reactions - Affect: WNL Mental trend/Content of thoughts - Perception: WNL  Mental trend/Content of thoughts - Orientation: WNL Mental trend/Content of thoughts - Memory: WNL Mental trend/Content of thoughts - General knowledge: WNL  Insight: WNL Judgment: WNL Intelligence: WNL Mental Status Comment: Client appears oriented to time and space. Overall functioning WNL  Adjustment disorder with anxiety   Treatment Plan Client Abilities/Strengths  Mary Wyatt is reflective, uninhibited  Client Treatment Preferences Mary Wyatt prefers in person sessions. She prefers sessions that fit with her work schedule and are not too early in the morning.  Client Statement of Needs  Mary Wyatt shared that she is seeking an outside, unattached perspective on her life  situation and experiences.  Treatment Level  She would like biweekly sessions.  Symptoms  Anxiety, anger, sleep challenges (she described herself as a light sleeper whose sleep is easily disrupted) Problems Addressed  Mary Wyatt shared  that she has a tendency toward negativity that she would like to address.  Goals 1. Mary Wyatt would like to process her breakup with her ex-boyfriend.  Objective Mary Wyatt would like to better understand the role she wants romantic relationship to play in her life, if any  Target Date: 04/06/2025 Frequency: biweekly  Progress: 0 Modality: Individual therapy  Related Interventions Mary Wyatt will have opportunities to process her experiences in session Therapist will help Mary Wyatt notice and disengage from maladaptive thoughts and behaviors using CBT based strategies Therapist will work with Mary Wyatt to establish and set healthy boundaries in relationship Therapist will provide referrals for additional resources as appropriate   Adjustment disorder with Anxiety      Mary Wyatt [name], PhD               [name] L [name], PhD

## 2024-06-21 ENCOUNTER — Ambulatory Visit: Admitting: Obstetrics and Gynecology

## 2024-06-22 ENCOUNTER — Ambulatory Visit (INDEPENDENT_AMBULATORY_CARE_PROVIDER_SITE_OTHER): Admitting: Clinical

## 2024-06-22 DIAGNOSIS — F4322 Adjustment disorder with anxiety: Secondary | ICD-10-CM | POA: Diagnosis not present

## 2024-06-22 NOTE — Progress Notes (Signed)
 Time: 11:00 am-11:57am CPT Code: 09162E Diagnosis: Adjustment disorder with anxiety  [name] was seen in person for individual therapy. Session focused on her sense of nervous system dysregulation following events over the past year, as well as processing stressors related to her current living situation. Therapist offered validation and support, encouraging her to explore her options. She is scheduled to be seen again in one week.   Intake Presenting Problem Mary Wyatt transitioned from therapy with the present therapist to pursue ERP through the platform [email or web address]. She continued OCD-targeted therapy for several years with a different provider. Since that time, she has moved back in with her parents, and has learned more about how her OCD developed. She was also in a two-year relationship during this time. She became concerned that therapy may have become a compulsion that prevented her from engaging with challenges in her life. She ended therapy and ended the relationship shortly thereafter. She lived with her boyfriend's parents from August 2024 for 6 months. She reported that she has lived with her parents for 1 of the past 10 months, after which time she moved back in with her boyfriend. The relationship ended at the end of April, 2025. She had moved out of their condo as of June 2025. Symptoms Anger, anxiety History of Problem  Mary Wyatt reported that living with her ex-boyfriend was especially challenging due to his tendency toward messiness. She has also found the move back in with her parents challenging. Her current struggle relates to having noticed that her parents do not wash their hands after going to the bathroom. Mary Wyatt has been unemployed for a year.  Recent Trigger  Mary Wyatt returned for CBT following a series of major life events. She has pursued ERP for OCD since 2022.  Marital and Family Information  Mary Wyatt has two siblings, one of whom is in [place] and the other is in [place]. She and her  siblings have experienced challenges with their parents related to different world views. She has struggled to set boundaries with her parents.   Present family concerns/problems: Mary Wyatt is in contact with her family and exploring how she might set boundaries with her parents.   Strengths/resources in the family/friends: Mary Wyatt reconnected in recent years with a friend who has a similar background to hers. They get together regularly.    Interpersonal concerns/problems: Mary Wyatt was in her most recent job for over 1.5 years. She described the work environment as highly toxic. She took FMLA for her mental health due to work-related stress. She ultimately left in August 2024, following a conflict with a coworker she had believed was a friend.  General Behavior: WNL Attire: WNL Gait: WNL Motor Activity: WNL Stream of Thought - Productivity: WNL Stream of thought - Progression: WNL Stream of thought - Language:  WNL Emotional tone and reactions - Mood: WNL  Emotional tone and reactions - Affect: WNL Mental trend/Content of thoughts - Perception: WNL  Mental trend/Content of thoughts - Orientation: WNL Mental trend/Content of thoughts - Memory: WNL Mental trend/Content of thoughts - General knowledge: WNL  Insight: WNL Judgment: WNL Intelligence: WNL Mental Status Comment: Client appears oriented to time and space. Overall functioning WNL  Adjustment disorder with anxiety   Treatment Plan Client Abilities/Strengths  Mary Wyatt is reflective, uninhibited  Client Treatment Preferences Mary Wyatt prefers in person sessions. She prefers sessions that fit with her work schedule and are not too early in the morning.  Client Statement of Needs  Mary Wyatt shared that she is seeking an outside, unattached perspective  on her life situation and experiences.  Treatment Level  She would like biweekly sessions.  Symptoms  Anxiety, anger, sleep challenges (she described herself as a light sleeper whose sleep is  easily disrupted) Problems Addressed  Mary Wyatt shared that she has a tendency toward negativity that she would like to address.  Goals 1. Mary Wyatt would like to process her breakup with her ex-boyfriend.  Objective Mary Wyatt would like to better understand the role she wants romantic relationship to play in her life, if any  Target Date: 04/06/2025 Frequency: biweekly  Progress: 0 Modality: Individual therapy  Related Interventions Mary Wyatt will have opportunities to process her experiences in session Therapist will help Mary Wyatt notice and disengage from maladaptive thoughts and behaviors using CBT based strategies Therapist will work with Mary Wyatt to establish and set healthy boundaries in relationship Therapist will provide referrals for additional resources as appropriate   Adjustment disorder with Anxiety      Mary [name], Mary Wyatt               Other Mary L Rainn Bullinger, Mary Wyatt

## 2024-06-27 ENCOUNTER — Encounter: Payer: Self-pay | Admitting: Obstetrics and Gynecology

## 2024-06-27 ENCOUNTER — Ambulatory Visit: Admitting: Obstetrics and Gynecology

## 2024-06-27 VITALS — BP 98/64 | HR 81 | Temp 98.2°F | Wt 157.0 lb

## 2024-06-27 DIAGNOSIS — N644 Mastodynia: Secondary | ICD-10-CM | POA: Diagnosis not present

## 2024-06-27 DIAGNOSIS — N6452 Nipple discharge: Secondary | ICD-10-CM

## 2024-06-27 DIAGNOSIS — N951 Menopausal and female climacteric states: Secondary | ICD-10-CM | POA: Diagnosis not present

## 2024-06-27 NOTE — Progress Notes (Signed)
 38 y.o. G0P0000 female with subacute vaginitis, endometriosis here for f/u breast symptoms. Single.  Patient's last menstrual period was 06/05/2024 (approximate).   On 03/21/24, she reported: Left breast pain x 3 days, painful to touch around nipple area. Period started today.  No nipple discharge or abnormal breast lumps.  Today, she reports that painful zaps have went away. She is still sore around the nipples, daily, mild discomfort. Noticed yellowish crusty discharge two days ago.  Benign MMG 12/2023  She reports hot flashes and concentration difficulties around her period.  Birth control: None Sexually active: not currently, recently single  GYN HISTORY: Endometriosis  OB History  Gravida Para Term Preterm AB Living  0 0 0 0 0 0  SAB IAB Ectopic Multiple Live Births  0 0 0 0 0   Past Medical History:  Diagnosis Date   Abdominal pain    Abnormal EKG    Dec 2019 and Jan 2020/ saw cardiologist   Allergy    Altered bowel function    Anemia    Anxiety    Biliary dyskinesia    Celiac disease    Chest tightness    Depression    Dermatitis    Deviated septum    DOE (dyspnea on exertion)    Dysmenorrhea    Endometriosis    Episodic lightheadedness    ETD (Eustachian tube dysfunction), bilateral    Family history of brain aneurysm    Genital warts    GERD (gastroesophageal reflux disease)    Hashimoto's disease    History of meningioma    Hypotension    Insomnia    Interstitial cystitis    Meningioma (HCC)    Mononucleosis    Pelvic pain    Perineal pain    Post-operative nausea and vomiting    Rhinitis    [facility] spotted fever) 2015   S/P resection of meningioma    Seasonal allergic rhinitis    Tendinitis    Ulcer    Unspecified dyspareunia    Urethral syndrome    Vitamin D  deficiency    Past Surgical History:  Procedure Laterality Date   COLONOSCOPY  2013   CRANIOTOMY  09/2018   laproscopic surgery     2013 and 2019   OVARIAN  CYST REMOVAL     TONSILLECTOMY     UPPER GASTROINTESTINAL ENDOSCOPY  2013   Current Outpatient Medications on File Prior to Visit  Medication Sig Dispense Refill   ALPRAZolam  (XANAX ) 0.25 MG tablet Take 0.25 mg by mouth as needed.     Cholecalciferol 125 MCG (5000 UT) capsule Take 5,000 Units by mouth 2 (two) times a week.     clindamycin  (CLEOCIN  T) 1 % external solution      Cyanocobalamin  1000 MCG SUBL Place 1 tablet under the tongue daily.     estradiol  (ESTRACE  VAGINAL) 0.1 MG/GM vaginal cream Place 1 g vaginally 3 (three) times a week. 42.5 g 3   ferrous sulfate 325 (65 FE) MG EC tablet Take 325 mg by mouth.     hydrOXYzine  (ATARAX ) 10 MG tablet TAKE 1 TABLET ORALLY THREE TIMES A DAY AS NEEDED     loratadine (CLARITIN) 10 MG tablet      Multiple Vitamin (MULTIVITAMIN) tablet Take 1 tablet by mouth daily. Women's MVI- Take one daily     olopatadine  (PATADAY ) 0.1 % ophthalmic solution      RABEprazole (ACIPHEX) 20 MG tablet Take 20 mg by mouth daily.  UBRELVY 50 MG TABS      UNABLE TO FIND Compounded hemorrhoid cream     VENTOLIN  HFA 108 (90 Base) MCG/ACT inhaler SMARTSIG:1-2 Puff(s) Via Inhaler Every 4-6 Hours PRN     Zinc  Sulfate (ZINC  15 PO) Take by mouth once.     No current facility-administered medications on file prior to visit.   Allergies  Allergen Reactions   Diflucan [Fluconazole] Hives   Gluten Meal Other (See Comments) and Nausea And Vomiting    Celiac       PE Today's Vitals   06/27/24 1425  BP: 98/64  Pulse: 81  Temp: 98.2 F (36.8 C)  TempSrc: Oral  SpO2: 99%  Weight: 157 lb (71.2 kg)   Body mass index is 25.73 kg/m.  Physical Exam Vitals reviewed.  Constitutional:      General: She is not in acute distress.    Appearance: Normal appearance.  HENT:     Head: Normocephalic and atraumatic.     Nose: Nose normal.  Eyes:     Extraocular Movements: Extraocular movements intact.     Conjunctiva/sclera: Conjunctivae normal.  Pulmonary:      Effort: Pulmonary effort is normal.  Chest:  Breasts:    Right: No mass, nipple discharge or skin change.     Left: Normal. No mass, nipple discharge or skin change.     Comments: Declined chaperone +nipple tenderness Musculoskeletal:        General: Normal range of motion.     Cervical back: Normal range of motion.  Lymphadenopathy:     Upper Body:     Right upper body: No axillary adenopathy.     Left upper body: No axillary adenopathy.  Neurological:     General: No focal deficit present.     Mental Status: She is alert.  Psychiatric:        Mood and Affect: Mood normal.        Behavior: Behavior normal.       Assessment and Plan:        Breast pain, left -     MM 3D DIAGNOSTIC MAMMOGRAM BILATERAL BREAST; Future -     US  LIMITED ULTRASOUND INCLUDING AXILLA LEFT BREAST ; Future -     US  LIMITED ULTRASOUND INCLUDING AXILLA RIGHT BREAST; Future  Nipple discharge -     MM 3D DIAGNOSTIC MAMMOGRAM BILATERAL BREAST; Future -     US  LIMITED ULTRASOUND INCLUDING AXILLA LEFT BREAST ; Future -     US  LIMITED ULTRASOUND INCLUDING AXILLA RIGHT BREAST; Future -     Prolactin  Perimenopause -     Thyroid  Panel With TSH -     FSH/LH   Will check hormonal levels given some symptoms could be related to perimenopause  Vera LULLA Pa, MD

## 2024-06-28 ENCOUNTER — Ambulatory Visit: Payer: Self-pay | Admitting: Obstetrics and Gynecology

## 2024-06-28 ENCOUNTER — Ambulatory Visit: Admitting: Clinical

## 2024-06-28 LAB — THYROID PANEL WITH TSH
Free Thyroxine Index: 2.5 (ref 1.4–3.8)
T3 Uptake: 32 % (ref 22–35)
T4, Total: 7.9 ug/dL (ref 5.1–11.9)
TSH: 2.41 m[IU]/L

## 2024-06-28 LAB — PROLACTIN: Prolactin: 7.4 ng/mL

## 2024-06-28 LAB — FSH/LH
FSH: 5.5 m[IU]/mL
LH: 2.4 m[IU]/mL

## 2024-07-05 ENCOUNTER — Ambulatory Visit: Admitting: Clinical

## 2024-07-07 ENCOUNTER — Other Ambulatory Visit

## 2024-07-07 ENCOUNTER — Ambulatory Visit
Admission: RE | Admit: 2024-07-07 | Discharge: 2024-07-07 | Disposition: A | Discharge status: Home / Self Care | Source: Ambulatory Visit | Attending: Obstetrics and Gynecology | Admitting: Obstetrics and Gynecology

## 2024-07-07 ENCOUNTER — Ambulatory Visit

## 2024-07-07 DIAGNOSIS — N644 Mastodynia: Secondary | ICD-10-CM

## 2024-07-07 DIAGNOSIS — N6452 Nipple discharge: Secondary | ICD-10-CM

## 2024-07-08 ENCOUNTER — Emergency Department (HOSPITAL_COMMUNITY)

## 2024-07-08 ENCOUNTER — Other Ambulatory Visit: Payer: Self-pay

## 2024-07-08 ENCOUNTER — Encounter (HOSPITAL_COMMUNITY): Payer: Self-pay

## 2024-07-08 ENCOUNTER — Emergency Department (HOSPITAL_COMMUNITY)
Admission: EM | Admit: 2024-07-08 | Discharge: 2024-07-09 | Disposition: A | Discharge status: Home / Self Care | Attending: Emergency Medicine | Admitting: Emergency Medicine

## 2024-07-08 DIAGNOSIS — M542 Cervicalgia: Secondary | ICD-10-CM | POA: Diagnosis not present

## 2024-07-08 DIAGNOSIS — R200 Anesthesia of skin: Secondary | ICD-10-CM | POA: Diagnosis not present

## 2024-07-08 DIAGNOSIS — H538 Other visual disturbances: Secondary | ICD-10-CM | POA: Diagnosis present

## 2024-07-08 DIAGNOSIS — H539 Unspecified visual disturbance: Secondary | ICD-10-CM

## 2024-07-08 LAB — PROTIME-INR
INR: 0.9 (ref 0.8–1.2)
Prothrombin Time: 13.1 s (ref 11.4–15.2)

## 2024-07-08 LAB — CBC
HCT: 40.1 % (ref 36.0–46.0)
Hemoglobin: 13.7 g/dL (ref 12.0–15.0)
MCH: 31.4 pg (ref 26.0–34.0)
MCHC: 34.2 g/dL (ref 30.0–36.0)
MCV: 92 fL (ref 80.0–100.0)
Platelets: 311 K/uL (ref 150–400)
RBC: 4.36 MIL/uL (ref 3.87–5.11)
RDW: 12.5 % (ref 11.5–15.5)
WBC: 5.8 K/uL (ref 4.0–10.5)
nRBC: 0 % (ref 0.0–0.2)

## 2024-07-08 LAB — COMPREHENSIVE METABOLIC PANEL WITH GFR
ALT: 15 U/L (ref 0–44)
AST: 21 U/L (ref 15–41)
Albumin: 4.5 g/dL (ref 3.5–5.0)
Alkaline Phosphatase: 53 U/L (ref 38–126)
Anion gap: 10 (ref 5–15)
BUN: 7 mg/dL (ref 6–20)
CO2: 23 mmol/L (ref 22–32)
Calcium: 9.7 mg/dL (ref 8.9–10.3)
Chloride: 106 mmol/L (ref 98–111)
Creatinine, Ser: 0.73 mg/dL (ref 0.44–1.00)
GFR, Estimated: >60 mL/min (ref >60)
Glucose, Bld: 103 mg/dL — ABNORMAL HIGH (ref 70–99)
Potassium: 4.3 mmol/L (ref 3.5–5.1)
Sodium: 139 mmol/L (ref 135–145)
Total Bilirubin: 0.6 mg/dL (ref 0.0–1.2)
Total Protein: 7.3 g/dL (ref 6.5–8.1)

## 2024-07-08 LAB — DIFFERENTIAL
Abs Immature Granulocytes: 0.01 K/uL (ref 0.00–0.07)
Basophils Absolute: 0.1 K/uL (ref 0.0–0.1)
Basophils Relative: 1 %
Eosinophils Absolute: 0.1 K/uL (ref 0.0–0.5)
Eosinophils Relative: 2 %
Immature Granulocytes: 0 %
Lymphocytes Relative: 29 %
Lymphs Abs: 1.7 K/uL (ref 0.7–4.0)
Monocytes Absolute: 0.4 K/uL (ref 0.1–1.0)
Monocytes Relative: 7 %
Neutro Abs: 3.5 K/uL (ref 1.7–7.7)
Neutrophils Relative %: 61 %

## 2024-07-08 LAB — HCG, SERUM, QUALITATIVE: Preg, Serum: NEGATIVE

## 2024-07-08 LAB — I-STAT CHEM 8, ED
BUN: 8 mg/dL (ref 6–20)
Calcium, Ion: 1.18 mmol/L (ref 1.15–1.40)
Chloride: 105 mmol/L (ref 98–111)
Creatinine, Ser: 0.7 mg/dL (ref 0.44–1.00)
Glucose, Bld: 102 mg/dL — ABNORMAL HIGH (ref 70–99)
HCT: 37 % (ref 36.0–46.0)
Hemoglobin: 12.6 g/dL (ref 12.0–15.0)
Potassium: 4.3 mmol/L (ref 3.5–5.1)
Sodium: 140 mmol/L (ref 135–145)
TCO2: 23 mmol/L (ref 22–32)

## 2024-07-08 LAB — APTT: aPTT: 23 s — ABNORMAL LOW (ref 24–36)

## 2024-07-08 LAB — ETHANOL: Alcohol, Ethyl (B): <15 mg/dL (ref <15)

## 2024-07-08 MED ORDER — SODIUM CHLORIDE 0.9% FLUSH
3.0000 mL | Freq: Once | INTRAVENOUS | Status: AC
  Administered 2024-07-08: 3 mL via INTRAVENOUS

## 2024-07-08 MED ORDER — IOHEXOL 350 MG/ML SOLN
75.0000 mL | Freq: Once | INTRAVENOUS | Status: AC | PRN
  Administered 2024-07-08: 75 mL via INTRAVENOUS

## 2024-07-08 NOTE — ED Notes (Signed)
 Called and placed PT on monitor with CCMD

## 2024-07-08 NOTE — ED Notes (Signed)
 Patient transported to MRI

## 2024-07-08 NOTE — ED Triage Notes (Signed)
 Pt came in via POV d/t not feeling right the past several weeks & today has been more worrisome. Pt reports around 1815 she had steam like waviness to her vision that is intermittently happening. Also, last night felt pain in the upper Lt shoulder that felt light tingling/numbness that radiates into her Lt side neck then into upper back. Reports having a mammogram yesterday & isn't sure if she just pulled something in the process (per pt). Additionally, states that she has been having swelling in th eye lids & around her eyes since July this year & pressure felt behind her eyes since march this year. Pt endorses with her medical Hx, family Hx & all s/s she is having she is at high risk of aneurysms so she is here for eval. A/Ox4, rates her pain 6/10 during triage.

## 2024-07-08 NOTE — ED Triage Notes (Signed)
 Pt states she is having wavy vision that started 30 min ago. Pt states she has been having headaches. Denies numbness/tingling. Axox4. Pt had MRI yesterday.

## 2024-07-08 NOTE — ED Provider Notes (Signed)
 [facility] Provider Note HPI Mary Wyatt is a 38 y.o. female with a medical history as below who presents to the emergency department for multiple symptoms.  Patient states that she has been feeling some pressure behind her eyes since March.  She is coming in today because she has had several episodes of vision changes that she describes as waviness.  These episodes started around 6:30 PM.  They occurred in both eyes simultaneously.  She has had several of them lasting approximately 15 minutes.  It feels like she is looking through steam.  She had no loss of visual acuity during these episodes.  She is asymptomatic now.  She is also complaining of left neck pain/numbness rating down her left arm since yesterday.  Of note, the patient did have a mammogram yesterday and she thinks that the positioning during her mammogram may have caused this.  Denies headaches, fevers, shortness of breath, chest pain, motor weakness.  Past Medical History:  Diagnosis Date   Abdominal pain    Abnormal EKG    Dec 2019 and Jan 2020/ saw cardiologist   Allergy    Altered bowel function    Anemia    Anxiety    Biliary dyskinesia    Celiac disease    Chest tightness    Depression    Dermatitis    Deviated septum    DOE (dyspnea on exertion)    Dysmenorrhea    Endometriosis    Episodic lightheadedness    ETD (Eustachian tube dysfunction), bilateral    Family history of brain aneurysm    Genital warts    GERD (gastroesophageal reflux disease)    Hashimoto's disease    History of meningioma    Hypotension    Insomnia    Interstitial cystitis    Meningioma (HCC)    Mononucleosis    Pelvic pain    Perineal pain    Post-operative nausea and vomiting    Rhinitis    R[facility] spotted fever) 2015   S/P resection of meningioma    Seasonal allergic rhinitis    Tendinitis    Ulcer    Unspecified dyspareunia    Urethral syndrome    Vitamin D   deficiency    Past Surgical History:  Procedure Laterality Date   COLONOSCOPY  2013   CRANIOTOMY  09/2018   laproscopic surgery     2013 and 2019   OVARIAN CYST REMOVAL     TONSILLECTOMY     UPPER GASTROINTESTINAL ENDOSCOPY  2013    Review of Systems Pertinent positives and negative findings are listed as part of the History of Present Illness and MDM  Physical Exam Vitals:   07/08/24 1855 07/08/24 1910 07/08/24 2100  BP: 137/79  100/70  Pulse: 98  86  Resp: 16  18  Temp: 98.2 F (36.8 C)    SpO2: 99%  100%  Weight:  72.1 kg   Height:  5' 5 (1.651 m)      Constitutional Nursing notes reviewed Vital signs reviewed  HEENT No obvious trauma Pupils round, equal, and reactive to light. No afferent pupillary defect EOMI 20/20 vision bilaterally Neck supple  Respiratory Effort normal Breathing well on room air CTAB  CV Normal rate and rhythm   Abdomen Soft, non-tender, non-distended No peritonitis  MSK Atraumatic No obvious deformity ROM appropriate  Neuro Cranial nerves II through XII intact Full strength in bilateral extremities Diminished sensation over the left lateral neck and  left upper extremity to the left wrist when compared to the right No nystagmus Finger-nose testing intact    MDM:  Initial Differential Diagnoses includes ischemic stroke, intracranial bleed, TIA, retinal detachment, CRAO, ocular pathology  I reviewed the patient's vitals, the nursing triage note and evaluated the patient at bedside.   I reviewed the patient's external records which show patient has had a meningioma that was resected.  She has a small residual probable meningioma that is being followed by neurosurgery.  She was also recently diagnosed with fibromuscular dysplasia.  She is coming in today with transient vision disturbances.  She describes seeing waviness.  She maintained full visual acuity during these episodes.  They started at 630.  They have since resolved.  I have  very low suspicion for ocular pathology as the patient has 20/20 vision and she had bilateral symptoms which would be very unusual for ocular pathology.  She has no evidence of an afferent pupillary deficit.   Patient also has sensory changes from the left neck down her left arm to the wrist.  She attributes this to an odd positioning that she was in during her mammogram yesterday but is unsure if they are related to her visual distrubances.  Given her history, CTA head and neck along with MRI brain obtained.  These were reassuring and showed no arterial abnormality.  No arterial dissection, dilatation or significant stenosis.  MRI brain reassuring with no acute findings.  Redemonstrated probable residual meningioma which the patient is aware of  Patient has 20/20 vision on exam and her neurologic exam is reassuring as detailed above.  She has had resolution of symptoms.  Labs are very reassuring with no leukocytosis, anemia, significant electrolyte abnormality, AKI or acute liver injury.  Patient stable for discharge home at this time.  She will follow-up with her ophthalmologist and PCP for further evaluation.  Procedures: Procedures  Medications administered in the ED: Medications  sodium chloride  flush (NS) 0.9 % injection 3 mL (3 mLs Intravenous Given 07/08/24 2059)  iohexol  (OMNIPAQUE ) 350 MG/ML injection 75 mL (75 mLs Intravenous Contrast Given 07/08/24 2243)     Impression: 1. Changes in vision      Patient's presentation is most consistent with acute presentation with potential threat to life or bodily function.  Disposition: ED Disposition:  Discharge   Discharge: Patient is felt to be medically appropriate for discharge at this time. Patient was instructed to follow up with their primary care doctor/specialists listed above for re-evaluation. Patient was given strict return precautions.  ED Discharge Orders     None             Dionisio Blunt, MD 07/09/24 9972     Patsey Lot, MD 07/11/24 [phone]

## 2024-07-09 NOTE — Discharge Instructions (Addendum)
 You were seen in the emergency department today for visual disturbances and numbness in your left arm.  While you are here we did physical exam, labs, CTA head and neck along with MRI of your brain.  These were reassuring and showed no emergency cause for your symptoms today.  Your MRI did redemonstrate the probable residual meningioma that you know about.  No other acute findings.  Please follow-up with an ophthalmologist and your primary care doctor for further evaluation.  Come back to emergency department if you have difficulty speaking, persistent vision changes, weakness on one side of your body or if you have any other reason to believe you need emergency care.

## 2024-07-16 ENCOUNTER — Encounter (HOSPITAL_BASED_OUTPATIENT_CLINIC_OR_DEPARTMENT_OTHER): Payer: Self-pay | Admitting: Physical Therapy

## 2024-08-04 ENCOUNTER — Ambulatory Visit: Admitting: Clinical

## 2024-08-04 DIAGNOSIS — F4322 Adjustment disorder with anxiety: Secondary | ICD-10-CM | POA: Diagnosis not present

## 2024-08-04 NOTE — Progress Notes (Signed)
 Time: 10:00 am-11:00am CPT Code: 09162E Diagnosis: Adjustment disorder with anxiety  Shawnae was seen in person for individual therapy. Session began by processing an additional diagnosis she had received  for a neurological vascular condition, which had triggered additional anxiety. She also reported that she was in the hiring process for a position at [employer]. Therapist offered validation and support, as well as suggested communication strategies. She is scheduled to be seen again in two weeks.  Intake Presenting Problem Enez transitioned from therapy with the present therapist to pursue ERP through the platform [email or web address]. She continued OCD-targeted therapy for several years with a different provider. Since that time, she has moved back in with her parents, and has learned more about how her OCD developed. She was also in a two-year relationship during this time. She became concerned that therapy may have become a compulsion that prevented her from engaging with challenges in her life. She ended therapy and ended the relationship shortly thereafter. She lived with her boyfriend's parents from August 2024 for 6 months. She reported that she has lived with her parents for 1 of the past 10 months, after which time she moved back in with her boyfriend. The relationship ended at the end of April, 2025. She had moved out of their condo as of June 2025. Symptoms Anger, anxiety History of Problem  Carleta reported that living with her ex-boyfriend was especially challenging due to his tendency toward messiness. She has also found the move back in with her parents challenging. Her current struggle relates to having noticed that her parents do not wash their hands after going to the bathroom. Joliet has been unemployed for a year.  Recent Trigger  Aliece returned for CBT following a series of major life events. She has pursued ERP for OCD since 2022.  Marital and Family Information  Ivanna has two siblings, one of  whom is in [place] and the other is in [place]. She and her siblings have experienced challenges with their parents related to different world views. She has struggled to set boundaries with her parents.   Present family concerns/problems: Reba is in contact with her family and exploring how she might set boundaries with her parents.   Strengths/resources in the family/friends: Dorette reconnected in recent years with a friend who has a similar background to hers. They get together regularly.    Interpersonal concerns/problems: Aaleyah was in her most recent job for over 1.5 years. She described the work environment as highly toxic. She took FMLA for her mental health due to work-related stress. She ultimately left in August 2024, following a conflict with a coworker she had believed was a friend.  General Behavior: WNL Attire: WNL Gait: WNL Motor Activity: WNL Stream of Thought - Productivity: WNL Stream of thought - Progression: WNL Stream of thought - Language:  WNL Emotional tone and reactions - Mood: WNL  Emotional tone and reactions - Affect: WNL Mental trend/Content of thoughts - Perception: WNL  Mental trend/Content of thoughts - Orientation: WNL Mental trend/Content of thoughts - Memory: WNL Mental trend/Content of thoughts - General knowledge: WNL  Insight: WNL Judgment: WNL Intelligence: WNL Mental Status Comment: Client appears oriented to time and space. Overall functioning WNL  Adjustment disorder with anxiety   Treatment Plan Client Abilities/Strengths  Rosary is reflective, uninhibited  Client Treatment Preferences Nevah prefers in person sessions. She prefers sessions that fit with her work schedule and are not too early in the morning.  Client Statement of Needs  Ladaysha  shared that she is seeking an outside, unattached perspective on her life situation and experiences.  Treatment Level  She would like biweekly sessions.  Symptoms  Anxiety, anger, sleep  challenges (she described herself as a light sleeper whose sleep is easily disrupted) Problems Addressed  Euna shared that she has a tendency toward negativity that she would like to address.  Goals 1. Feiga would like to process her breakup with her ex-boyfriend.  Objective Chelan would like to better understand the role she wants romantic relationship to play in her life, if any  Target Date: 04/06/2025 Frequency: biweekly  Progress: 0 Modality: Individual therapy  Related Interventions Sigourney will have opportunities to process her experiences in session Therapist will help Joli notice and disengage from maladaptive thoughts and behaviors using CBT based strategies Therapist will work with Lauraine to establish and set healthy boundaries in relationship Therapist will provide referrals for additional resources as appropriate   Adjustment disorder with Anxiety      Andriette [name], PhD               Baltazar Pekala L Natanel Snavely, PhD               Oseph Imburgia L Kenton Fortin, PhD

## 2024-08-11 ENCOUNTER — Ambulatory Visit: Admitting: Clinical

## 2024-08-11 DIAGNOSIS — F4322 Adjustment disorder with anxiety: Secondary | ICD-10-CM | POA: Diagnosis not present

## 2024-08-11 NOTE — Progress Notes (Signed)
 Time: 10:00 am-11:00am CPT Code: 09162E Diagnosis: Adjustment disorder with anxiety  Mary Wyatt was seen in person for individual therapy. She reported that she had been offered the job at [employer]. Session included processing events from her past job, as well as issues that had arisen in relationships with family members related to religious trauma. She is scheduled to be seen again in two weeks, and will reach out if she needs to change her appointment based on her new work schedule.  Intake Presenting Problem Mary Wyatt transitioned from therapy with the present therapist to pursue ERP through the platform [email or web address]. She continued OCD-targeted therapy for several years with a different provider. Since that time, she has moved back in with her parents, and has learned more about how her OCD developed. She was also in a two-year relationship during this time. She became concerned that therapy may have become a compulsion that prevented her from engaging with challenges in her life. She ended therapy and ended the relationship shortly thereafter. She lived with her boyfriend's parents from August 2024 for 6 months. She reported that she has lived with her parents for 1 of the past 10 months, after which time she moved back in with her boyfriend. The relationship ended at the end of April, 2025. She had moved out of their condo as of June 2025. Symptoms Anger, anxiety History of Problem  Mary Wyatt reported that living with her ex-boyfriend was especially challenging due to his tendency toward messiness. She has also found the move back in with her parents challenging. Her current struggle relates to having noticed that her parents do not wash their hands after going to the bathroom. Mary Wyatt has been unemployed for a year.  Recent Trigger  Mary Wyatt returned for CBT following a series of major life events. She has pursued ERP for OCD since 2022.  Marital and Family Information  Mary Wyatt has two siblings, one of whom is in [place]  and the other is in [place]. She and her siblings have experienced challenges with their parents related to different world views. She has struggled to set boundaries with her parents.   Present family concerns/problems: Mary Wyatt is in contact with her family and exploring how she might set boundaries with her parents.   Strengths/resources in the family/friends: Mary Wyatt reconnected in recent years with a friend who has a similar background to hers. They get together regularly.    Interpersonal concerns/problems: Mary Wyatt was in her most recent job for over 1.5 years. She described the work environment as highly toxic. She took FMLA for her mental health due to work-related stress. She ultimately left in August 2024, following a conflict with a coworker she had believed was a friend.  General Behavior: WNL Attire: WNL Gait: WNL Motor Activity: WNL Stream of Thought - Productivity: WNL Stream of thought - Progression: WNL Stream of thought - Language:  WNL Emotional tone and reactions - Mood: WNL  Emotional tone and reactions - Affect: WNL Mental trend/Content of thoughts - Perception: WNL  Mental trend/Content of thoughts - Orientation: WNL Mental trend/Content of thoughts - Memory: WNL Mental trend/Content of thoughts - General knowledge: WNL  Insight: WNL Judgment: WNL Intelligence: WNL Mental Status Comment: Client appears oriented to time and space. Overall functioning WNL  Adjustment disorder with anxiety   Treatment Plan Client Abilities/Strengths  Mary Wyatt is reflective, uninhibited  Client Treatment Preferences Mary Wyatt prefers in person sessions. She prefers sessions that fit with her work schedule and are not too early in the morning.  Client  Statement of Needs  Mary Wyatt shared that she is seeking an outside, unattached perspective on her life situation and experiences.  Treatment Level  She would like biweekly sessions.  Symptoms  Anxiety, anger, sleep challenges (she described  herself as a light sleeper whose sleep is easily disrupted) Problems Addressed  Mary Wyatt shared that she has a tendency toward negativity that she would like to address.  Goals 1. Mary Wyatt would like to process her breakup with her ex-boyfriend.  Objective Mary Wyatt would like to better understand the role she wants romantic relationship to play in her life, if any  Target Date: 04/06/2025 Frequency: biweekly  Progress: 0 Modality: Individual therapy  Related Interventions Mary Wyatt will have opportunities to process her experiences in session Therapist will help Mary Wyatt notice and disengage from maladaptive thoughts and behaviors using CBT based strategies Therapist will work with Mary Wyatt to establish and set healthy boundaries in relationship Therapist will provide referrals for additional resources as appropriate   Adjustment disorder with Anxiety     Mary Wyatt Mary Ponto, PhD

## 2024-08-18 ENCOUNTER — Ambulatory Visit: Admitting: Clinical

## 2024-08-18 DIAGNOSIS — F4322 Adjustment disorder with anxiety: Secondary | ICD-10-CM

## 2024-08-18 NOTE — Progress Notes (Signed)
 Time: 10:00 am-11:00am CPT Code: 09162E Diagnosis: Adjustment disorder with anxiety  Mary Wyatt was seen in person for individual therapy. She reported an increase in stress over the past week as she anticipates starting her new job. Session especially focus on stressors related to dynamics in her relationship with her parents. Therapist suggested strategies to help Mary Wyatt slow down her response time and self-regulate, including effortfully attempting to slow down and repeating a mantra to herself, such as I choose not to care about this. She is scheduled to be seen again in two weeks.   Intake Presenting Problem Mary Wyatt transitioned from therapy with the present therapist to pursue ERP through the platform [email or web address]. She continued OCD-targeted therapy for several years with a different provider. Since that time, she has moved back in with her parents, and has learned more about how her OCD developed. She was also in a two-year relationship during this time. She became concerned that therapy may have become a compulsion that prevented her from engaging with challenges in her life. She ended therapy and ended the relationship shortly thereafter. She lived with her boyfriend's parents from August 2024 for 6 months. She reported that she has lived with her parents for 1 of the past 10 months, after which time she moved back in with her boyfriend. The relationship ended at the end of April, 2025. She had moved out of their condo as of June 2025. Symptoms Anger, anxiety History of Problem  Mary Wyatt reported that living with her ex-boyfriend was especially challenging due to his tendency toward messiness. She has also found the move back in with her parents challenging. Her current struggle relates to having noticed that her parents do not wash their hands after going to the bathroom. Mary Wyatt has been unemployed for a year.  Recent Trigger  Mary Wyatt returned for CBT following a series of major life events. She has pursued  ERP for OCD since 2022.  Marital and Family Information  Mary Wyatt has two siblings, one of whom is in [place] and the other is in [place]. She and her siblings have experienced challenges with their parents related to different world views. She has struggled to set boundaries with her parents.   Present family concerns/problems: Mary Wyatt is in contact with her family and exploring how she might set boundaries with her parents.   Strengths/resources in the family/friends: Mary Wyatt reconnected in recent years with a friend who has a similar background to hers. They get together regularly.    Interpersonal concerns/problems: Mary Wyatt was in her most recent job for over 1.5 years. She described the work environment as highly toxic. She took FMLA for her mental health due to work-related stress. She ultimately left in August 2024, following a conflict with a coworker she had believed was a friend.  General Behavior: WNL Attire: WNL Gait: WNL Motor Activity: WNL Stream of Thought - Productivity: WNL Stream of thought - Progression: WNL Stream of thought - Language:  WNL Emotional tone and reactions - Mood: WNL  Emotional tone and reactions - Affect: WNL Mental trend/Content of thoughts - Perception: WNL  Mental trend/Content of thoughts - Orientation: WNL Mental trend/Content of thoughts - Memory: WNL Mental trend/Content of thoughts - General knowledge: WNL  Insight: WNL Judgment: WNL Intelligence: WNL Mental Status Comment: Client appears oriented to time and space. Overall functioning WNL  Adjustment disorder with anxiety   Treatment Plan Client Abilities/Strengths  Mary Wyatt is reflective, uninhibited  Client Treatment Preferences Mary Wyatt prefers in person sessions. She prefers sessions that fit  with her work schedule and are not too early in the morning.  Client Statement of Needs  Mary Wyatt shared that she is seeking an outside, unattached perspective on her life situation and experiences.   Treatment Level  She would like biweekly sessions.  Symptoms  Anxiety, anger, sleep challenges (she described herself as a light sleeper whose sleep is easily disrupted) Problems Addressed  Mary Wyatt shared that she has a tendency toward negativity that she would like to address.  Goals 1. Mary Wyatt would like to process her breakup with her ex-boyfriend.  Objective Mary Wyatt would like to better understand the role she wants romantic relationship to play in her life, if any  Target Date: 04/06/2025 Frequency: biweekly  Progress: 0 Modality: Individual therapy  Related Interventions Mary Wyatt will have opportunities to process her experiences in session Therapist will help Mary Wyatt notice and disengage from maladaptive thoughts and behaviors using CBT based strategies Therapist will work with Mary Wyatt to establish and set healthy boundaries in relationship Therapist will provide referrals for additional resources as appropriate   Adjustment disorder with Anxiety     Mary [name], PhD               Mary Crookshanks L Talis Iwan, PhD

## 2024-08-25 ENCOUNTER — Ambulatory Visit: Admitting: Clinical

## 2024-09-02 ENCOUNTER — Encounter: Payer: Self-pay | Admitting: Radiology

## 2024-09-02 ENCOUNTER — Ambulatory Visit: Admitting: Radiology

## 2024-09-02 VITALS — BP 104/78 | Wt 157.0 lb

## 2024-09-02 DIAGNOSIS — R350 Frequency of micturition: Secondary | ICD-10-CM

## 2024-09-02 DIAGNOSIS — N760 Acute vaginitis: Secondary | ICD-10-CM | POA: Diagnosis not present

## 2024-09-02 LAB — URINALYSIS, COMPLETE W/RFL CULTURE
Bacteria, UA: NONE SEEN /HPF
Bilirubin Urine: NEGATIVE
Glucose, UA: NEGATIVE
Hgb urine dipstick: NEGATIVE
Hyaline Cast: NONE SEEN /LPF
Ketones, ur: NEGATIVE
Leukocyte Esterase: NEGATIVE
Nitrites, Initial: NEGATIVE
Protein, ur: NEGATIVE
RBC / HPF: NONE SEEN /HPF (ref 0–2)
Specific Gravity, Urine: 1.02 (ref 1.001–1.035)
WBC, UA: NONE SEEN /HPF (ref 0–5)
pH: 7 (ref 5.0–8.0)

## 2024-09-02 LAB — WET PREP FOR TRICH, YEAST, CLUE

## 2024-09-02 LAB — NO CULTURE INDICATED

## 2024-09-02 MED ORDER — TERCONAZOLE 0.8 % VA CREA: 1.0000 | TOPICAL_CREAM | Freq: Every day | VAGINAL | 0 refills | Status: DC

## 2024-09-02 NOTE — Progress Notes (Signed)
      Subjective: Mary Wyatt is a 38 y.o. female who complains of vaginal discharge, itching, burning, urinary frequency. Symptoms began 1 week ago (negative self swab at Urgent Care Sunday). Finished a 1 week course of doxy 7 days ago. Not sexually active.    Review of Systems  All other systems reviewed and are negative.   Past Medical History:  Diagnosis Date   Abdominal pain    Abnormal EKG    Dec 2019 and Jan 2020/ saw cardiologist   Allergy    Altered bowel function    Anemia    Anxiety    Biliary dyskinesia    Celiac disease    Chest tightness    Depression    Dermatitis    Deviated septum    DOE (dyspnea on exertion)    Dysmenorrhea    Endometriosis    Episodic lightheadedness    ETD (Eustachian tube dysfunction), bilateral    Family history of brain aneurysm    Genital warts    GERD (gastroesophageal reflux disease)    Hashimoto's disease    History of meningioma    Hypotension    Insomnia    Interstitial cystitis    Meningioma (HCC)    Mononucleosis    Pelvic pain    Perineal pain    Post-operative nausea and vomiting    Rhinitis    [facility] spotted fever) 2015   S/P resection of meningioma    Seasonal allergic rhinitis    Tendinitis    Ulcer    Unspecified dyspareunia    Urethral syndrome    Vitamin D  deficiency       Objective:  Today's Vitals   09/02/24 1326  BP: 104/78  Weight: 157 lb (71.2 kg)   Body mass index is 26.13 kg/m.   Physical Exam Vitals and nursing note reviewed. Exam conducted with a chaperone present.  Constitutional:      Appearance: Normal appearance. She is well-developed.  Pulmonary:     Effort: Pulmonary effort is normal.  Abdominal:     General: Abdomen is flat.     Palpations: Abdomen is soft.  Genitourinary:    General: Normal vulva.     Vagina: Vaginal discharge present. No erythema, bleeding or lesions.     Cervix: Normal. No discharge, friability, lesion or erythema.     Uterus:  Normal.      Adnexa: Right adnexa normal and left adnexa normal.  Neurological:     Mental Status: She is alert.  Psychiatric:        Mood and Affect: Mood normal.        Thought Content: Thought content normal.        Judgment: Judgment normal.     Microscopic wet-mount exam shows hyphae.   Darice Hoit, [name] present for exam  Assessment:/Plan:   1. Urinary frequency (Primary) - Urinalysis,Complete w/RFL Culture; negative  2. Acute vaginitis - WET PREP FOR TRICH, YEAST, CLUE - terconazole  (TERAZOL 3 ) 0.8 % vaginal cream; Place 1 applicator vaginally at bedtime.  Dispense: 20 g; Refill: 0   Avoid intercourse until symptoms are resolved. Safe sex encouraged. Avoid the use of soaps or perfumed products in the peri area. Avoid tub baths and sitting in sweaty or wet clothing for prolonged periods of time.     Mckensi Redinger B, NP 1:32 PM

## 2024-09-08 ENCOUNTER — Ambulatory Visit: Admitting: Clinical

## 2024-09-09 ENCOUNTER — Ambulatory Visit: Admitting: Nurse Practitioner

## 2024-09-09 ENCOUNTER — Encounter: Payer: Self-pay | Admitting: Nurse Practitioner

## 2024-09-09 VITALS — BP 120/64 | HR 74 | Temp 99.0°F | Wt 157.0 lb

## 2024-09-09 DIAGNOSIS — R208 Other disturbances of skin sensation: Secondary | ICD-10-CM | POA: Diagnosis not present

## 2024-09-09 DIAGNOSIS — B3731 Acute candidiasis of vulva and vagina: Secondary | ICD-10-CM

## 2024-09-09 DIAGNOSIS — N898 Other specified noninflammatory disorders of vagina: Secondary | ICD-10-CM

## 2024-09-09 LAB — WET PREP FOR TRICH, YEAST, CLUE

## 2024-09-09 MED ORDER — TERCONAZOLE 0.8 % VA CREA: 1.0000 | TOPICAL_CREAM | Freq: Every day | VAGINAL | 1 refills | Status: DC

## 2024-09-09 NOTE — Progress Notes (Signed)
   Acute Office Visit  Subjective:    Patient ID: Mary Wyatt, female    DOB: [birth year], 38 y.o.   MRN: [id number]   HPI 38 y.o. presents today for vaginal irritation, burning x 4 days. H/O recurrent BV so she used one boric acid 4 days ago. Itching is better, but burning still present. Treated for yeast 11/14 after being on Doxycycline  for 10 days for eye infection. Symptoms fully resolved and came back. Has allergy to Diflucan, uses Terazol.   No LMP recorded (lmp unknown). (Menstrual status: Irregular Periods).    Review of Systems  Constitutional: Negative.   Genitourinary:  Positive for vaginal pain (Burning). Negative for vaginal discharge.       Objective:    Physical Exam Constitutional:      Appearance: Normal appearance.  Genitourinary:    General: Normal vulva.     Vagina: Vaginal discharge present. No erythema.     Cervix: Normal.     BP 120/64 (BP Location: Right Arm, Patient Position: Sitting, Cuff Size: Normal)   Pulse 74   Temp 99 F (37.2 C) (Oral)   Wt 157 lb (71.2 kg)   LMP  (LMP Unknown)   SpO2 98%   BMI 26.13 kg/m  Wt Readings from Last 3 Encounters:  09/09/24 157 lb (71.2 kg)  09/02/24 157 lb (71.2 kg)  07/08/24 159 lb (72.1 kg)        Mary Wyatt, [name] present as biomedical engineer.   Wet prep + yeast  UA negative  Assessment & Plan:   Problem List Items Addressed This Visit   None Visit Diagnoses       Vaginal candidiasis    -  Primary   Relevant Medications   terconazole  (TERAZOL 3 ) 0.8 % vaginal cream     Burning sensation       Relevant Orders   Urinalysis,Complete w/RFL Culture     Vaginal itching       Relevant Orders   WET PREP FOR TRICH, YEAST, CLUE      Plan: Wet prep positive for yeast - Terazol 0.8% nightly x 3 nights, refill provided. Use boric acid twice weekly for a few weeks after treating. UA unremarkable.    Return if symptoms worsen or fail to improve.    Mary DELENA Shutter DNP, 9:37 AM 09/09/2024

## 2024-09-11 LAB — CULTURE INDICATED

## 2024-09-11 LAB — URINALYSIS, COMPLETE W/RFL CULTURE
Bilirubin Urine: NEGATIVE
Glucose, UA: NEGATIVE
Hyaline Cast: NONE SEEN /LPF
Ketones, ur: NEGATIVE
Leukocyte Esterase: NEGATIVE
Nitrites, Initial: NEGATIVE
Protein, ur: NEGATIVE
Specific Gravity, Urine: 1.02 (ref 1.001–1.035)
pH: 5.5 (ref 5.0–8.0)

## 2024-09-11 LAB — URINE CULTURE
MICRO NUMBER:: 17268044
Result:: NO GROWTH
SPECIMEN QUALITY:: ADEQUATE

## 2024-09-12 ENCOUNTER — Ambulatory Visit: Payer: Self-pay | Admitting: Nurse Practitioner

## 2024-09-22 ENCOUNTER — Ambulatory Visit: Admitting: Clinical

## 2024-09-26 ENCOUNTER — Encounter: Payer: Self-pay | Admitting: Nurse Practitioner

## 2024-09-26 ENCOUNTER — Ambulatory Visit: Admitting: Nurse Practitioner

## 2024-09-26 VITALS — BP 104/70 | HR 82

## 2024-09-26 DIAGNOSIS — N898 Other specified noninflammatory disorders of vagina: Secondary | ICD-10-CM

## 2024-09-26 DIAGNOSIS — N9489 Other specified conditions associated with female genital organs and menstrual cycle: Secondary | ICD-10-CM | POA: Diagnosis not present

## 2024-09-26 DIAGNOSIS — N760 Acute vaginitis: Secondary | ICD-10-CM | POA: Diagnosis not present

## 2024-09-26 LAB — WET PREP FOR TRICH, YEAST, CLUE

## 2024-09-26 MED ORDER — CLOBETASOL PROPIONATE 0.05 % EX OINT: 1.0000 | TOPICAL_OINTMENT | Freq: Two times a day (BID) | CUTANEOUS | 0 refills | Status: AC

## 2024-09-26 NOTE — Progress Notes (Signed)
   Acute Office Visit  Subjective:    Patient ID: Mary Wyatt, female    DOB: 12/29/1985, 38 y.o.   MRN: [id number]   HPI 38 y.o. presents today for vaginal itching and burning. Treated for yeast 11/14 and 11/21 with Terazol. Feels little relief while doing treatment. Burning is mostly at vaginal opening. Did boric acid for a few days but started having burning. Has allergy to Diflucan (hives). Has history of chronic vulvitis. Has tried vaginal estrogen.   Patient's last menstrual period was 09/19/2024 (exact date). Period Duration (Days): 6-7 Period Pattern: Regular Menstrual Flow: Light, Moderate Menstrual Control: Maxi pad, Tampon Dysmenorrhea: (!) Severe Dysmenorrhea Symptoms: Cramping, Headache  Review of Systems  Constitutional: Negative.   Genitourinary:  Positive for vaginal pain (Itching and burning). Negative for vaginal discharge.       Objective:    Physical Exam Exam conducted with a chaperone present.  Constitutional:      Appearance: Normal appearance.  Genitourinary:    Vagina: Vaginal discharge present. No erythema.     Cervix: Normal.     Comments: Mild external redness    BP 104/70   Pulse 82   LMP 09/19/2024 (Exact Date)   SpO2 99%  Wt Readings from Last 3 Encounters:  09/09/24 157 lb (71.2 kg)  09/02/24 157 lb (71.2 kg)  07/08/24 159 lb (72.1 kg)        Zada Louder, [name] present as chaperone.   Wet prep negative for pathogens  Assessment & Plan:   Problem List Items Addressed This Visit   None Visit Diagnoses       Vulvar burning    -  Primary   Relevant Medications   clobetasol  ointment (TEMOVATE ) 0.05 %   Other Relevant Orders   WET PREP FOR TRICH, YEAST, CLUE     Recurrent vaginitis       Relevant Orders   SureSwab Advanced Candida Vaginitis (CV), TMA   SureSwab Advanced Bacterial Vaginosis (BV), TMA     Vaginal itching          Plan: Wet prep negative for yeast, BV and trich. BV and yeast culture pending. Apply  Clobetasol  BID x 7 days.   Return if symptoms worsen or fail to improve.    [name] DNP, 1:56 PM 09/26/2024

## 2024-09-27 ENCOUNTER — Ambulatory Visit: Admitting: Nurse Practitioner

## 2024-09-27 LAB — SURESWAB® ADVANCED CANDIDA VAGINITIS (CV), TMA
CANDIDA SPECIES: NOT DETECTED
Candida glabrata: NOT DETECTED

## 2024-09-27 LAB — SURESWAB® ADVANCED BACTERIAL VAGINOSIS (BV), TMA: SURESWAB(R) ADV BACTERIAL VAGINOSIS(BV),TMA: NEGATIVE

## 2024-09-28 ENCOUNTER — Ambulatory Visit: Payer: Self-pay | Admitting: Nurse Practitioner

## 2024-09-29 ENCOUNTER — Ambulatory Visit: Admitting: Clinical

## 2024-10-06 ENCOUNTER — Ambulatory Visit: Admitting: Clinical

## 2024-11-02 ENCOUNTER — Ambulatory Visit: Admitting: Clinical

## 2024-11-02 DIAGNOSIS — F4322 Adjustment disorder with anxiety: Secondary | ICD-10-CM

## 2024-11-02 NOTE — Progress Notes (Signed)
 Time: 1:00 pm-2:00 pm CPT Code: 09162E Diagnosis: Adjustment disorder with anxiety  Patrese was seen in person for individual therapy. Session focused on Taylor's experiences adjusting to her new job, which has not gone well. She noted the pattern and queried whether she may benefit from a psychological evaluation. Aerie expressed interest in being evaluated for autism, and therapist offerd to facilitate a referral. For homework, she will try to shift focus from the emotions of her coworkers. She is scheduled to be seen again in two weeks.   Intake Presenting Problem Gordie transitioned from therapy with the present therapist to pursue ERP through the platform [email or web address]. She continued OCD-targeted therapy for several years with a different provider. Since that time, she has moved back in with her parents, and has learned more about how her OCD developed. She was also in a two-year relationship during this time. She became concerned that therapy may have become a compulsion that prevented her from engaging with challenges in her life. She ended therapy and ended the relationship shortly thereafter. She lived with her boyfriend's parents from August 2024 for 6 months. She reported that she has lived with her parents for 1 of the past 10 months, after which time she moved back in with her boyfriend. The relationship ended at the end of April, 2025. She had moved out of their condo as of June 2025. Symptoms Anger, anxiety History of Problem  Jahnavi reported that living with her ex-boyfriend was especially challenging due to his tendency toward messiness. She has also found the move back in with her parents challenging. Her current struggle relates to having noticed that her parents do not wash their hands after going to the bathroom. Ciara has been unemployed for a year.  Recent Trigger  Kayleana returned for CBT following a series of major life events. She has pursued ERP for OCD since 2022.  Marital and Family  Information  Safia has two siblings, one of whom is in [place] and the other is in [place]. She and her siblings have experienced challenges with their parents related to different world views. She has struggled to set boundaries with her parents.   Present family concerns/problems: Kimyetta is in contact with her family and exploring how she might set boundaries with her parents.   Strengths/resources in the family/friends: Kashawna reconnected in recent years with a friend who has a similar background to hers. They get together regularly.    Interpersonal concerns/problems: Apolonia was in her most recent job for over 1.5 years. She described the work environment as highly toxic. She took FMLA for her mental health due to work-related stress. She ultimately left in August 2024, following a conflict with a coworker she had believed was a friend.  General Behavior: WNL Attire: WNL Gait: WNL Motor Activity: WNL Stream of Thought - Productivity: WNL Stream of thought - Progression: WNL Stream of thought - Language:  WNL Emotional tone and reactions - Mood: WNL  Emotional tone and reactions - Affect: WNL Mental trend/Content of thoughts - Perception: WNL  Mental trend/Content of thoughts - Orientation: WNL Mental trend/Content of thoughts - Memory: WNL Mental trend/Content of thoughts - General knowledge: WNL  Insight: WNL Judgment: WNL Intelligence: WNL Mental Status Comment: Client appears oriented to time and space. Overall functioning WNL  Adjustment disorder with anxiety   Treatment Plan Client Abilities/Strengths  Jashley is reflective, uninhibited  Client Treatment Preferences Stevi prefers in person sessions. She prefers sessions that fit with her work schedule and are  not too early in the morning.  Client Statement of Needs  Londyn shared that she is seeking an outside, unattached perspective on her life situation and experiences.  Treatment Level  She would like biweekly  sessions.  Symptoms  Anxiety, anger, sleep challenges (she described herself as a light sleeper whose sleep is easily disrupted) Problems Addressed  Benedicta shared that she has a tendency toward negativity that she would like to address.  Goals 1. Inika would like to process her breakup with her ex-boyfriend.  Objective Niobe would like to better understand the role she wants romantic relationship to play in her life, if any  Target Date: 04/06/2025 Frequency: biweekly  Progress: 0 Modality: Individual therapy  Related Interventions Raksha will have opportunities to process her experiences in session Therapist will help Tela notice and disengage from maladaptive thoughts and behaviors using CBT based strategies Therapist will work with Lauraine to establish and set healthy boundaries in relationship Therapist will provide referrals for additional resources as appropriate   Adjustment disorder with Anxiety     Andriette [name], PhD

## 2024-11-15 ENCOUNTER — Ambulatory Visit: Admitting: Clinical

## 2024-11-17 ENCOUNTER — Ambulatory Visit: Admitting: Clinical

## 2024-12-13 ENCOUNTER — Ambulatory Visit: Admitting: Psychology

## 2024-12-14 ENCOUNTER — Ambulatory Visit: Admitting: Clinical

## 2024-12-27 ENCOUNTER — Other Ambulatory Visit: Admitting: Psychology

## 2025-01-02 ENCOUNTER — Other Ambulatory Visit: Admitting: Psychology

## 2025-01-06 ENCOUNTER — Ambulatory Visit: Admitting: Psychology
